# Patient Record
Sex: Male | Born: 1935
Health system: Southern US, Community
[De-identification: ages and names within clinical notes are randomized; demographics above are authoritative.]

## PROBLEM LIST (undated history)

## (undated) DIAGNOSIS — M1990 Unspecified osteoarthritis, unspecified site: Secondary | ICD-10-CM

## (undated) DIAGNOSIS — J309 Allergic rhinitis, unspecified: Secondary | ICD-10-CM

## (undated) DIAGNOSIS — R39198 Other difficulties with micturition: Secondary | ICD-10-CM

## (undated) DIAGNOSIS — J189 Pneumonia, unspecified organism: Secondary | ICD-10-CM

## (undated) DIAGNOSIS — C3490 Malignant neoplasm of unspecified part of unspecified bronchus or lung: Secondary | ICD-10-CM

## (undated) DIAGNOSIS — N400 Enlarged prostate without lower urinary tract symptoms: Secondary | ICD-10-CM

## (undated) DIAGNOSIS — R490 Dysphonia: Secondary | ICD-10-CM

## (undated) DIAGNOSIS — R0600 Dyspnea, unspecified: Secondary | ICD-10-CM

## (undated) DIAGNOSIS — I480 Paroxysmal atrial fibrillation: Secondary | ICD-10-CM

## (undated) DIAGNOSIS — R519 Headache, unspecified: Secondary | ICD-10-CM

## (undated) DIAGNOSIS — J449 Chronic obstructive pulmonary disease, unspecified: Secondary | ICD-10-CM

## (undated) DIAGNOSIS — R51 Headache: Secondary | ICD-10-CM

## (undated) DIAGNOSIS — K279 Peptic ulcer, site unspecified, unspecified as acute or chronic, without hemorrhage or perforation: Secondary | ICD-10-CM

## (undated) DIAGNOSIS — J329 Chronic sinusitis, unspecified: Secondary | ICD-10-CM

## (undated) DIAGNOSIS — A048 Other specified bacterial intestinal infections: Secondary | ICD-10-CM

## (undated) DIAGNOSIS — G4700 Insomnia, unspecified: Secondary | ICD-10-CM

## (undated) DIAGNOSIS — R5383 Other fatigue: Secondary | ICD-10-CM

## (undated) DIAGNOSIS — E785 Hyperlipidemia, unspecified: Secondary | ICD-10-CM

## (undated) DIAGNOSIS — G8929 Other chronic pain: Secondary | ICD-10-CM

## (undated) DIAGNOSIS — K219 Gastro-esophageal reflux disease without esophagitis: Secondary | ICD-10-CM

## (undated) DIAGNOSIS — R5381 Other malaise: Secondary | ICD-10-CM

## (undated) HISTORY — DX: Allergic rhinitis, unspecified: J30.9

## (undated) HISTORY — PX: SHOULDER ARTHROSCOPY: SHX128

## (undated) HISTORY — DX: Headache: R51

## (undated) HISTORY — DX: Headache, unspecified: R51.9

## (undated) HISTORY — DX: Chronic obstructive pulmonary disease, unspecified: J44.9

## (undated) HISTORY — DX: Other chronic pain: G89.29

## (undated) HISTORY — PX: CATARACT EXTRACTION: SUR2

## (undated) HISTORY — DX: Benign prostatic hyperplasia without lower urinary tract symptoms: N40.0

## (undated) HISTORY — PX: HEMORRHOID BANDING: SHX5850

## (undated) HISTORY — PX: COLONOSCOPY: SHX174

## (undated) HISTORY — DX: Other malaise: R53.81

## (undated) HISTORY — PX: KNEE ARTHROSCOPY: SHX127

## (undated) HISTORY — DX: Dysphonia: R49.0

## (undated) HISTORY — DX: Peptic ulcer, site unspecified, unspecified as acute or chronic, without hemorrhage or perforation: K27.9

## (undated) HISTORY — PX: ROTATOR CUFF REPAIR: SHX139

## (undated) HISTORY — DX: Hyperlipidemia, unspecified: E78.5

## (undated) HISTORY — DX: Other fatigue: R53.83

## (undated) HISTORY — DX: Gastro-esophageal reflux disease without esophagitis: K21.9

## (undated) HISTORY — DX: Insomnia, unspecified: G47.00

---

## 1998-01-06 ENCOUNTER — Emergency Department (HOSPITAL_COMMUNITY): Admission: EM | Admit: 1998-01-06 | Discharge: 1998-01-06 | Payer: Self-pay

## 2000-10-17 ENCOUNTER — Ambulatory Visit (HOSPITAL_COMMUNITY): Admission: RE | Admit: 2000-10-17 | Discharge: 2000-10-17 | Payer: Self-pay | Admitting: Gastroenterology

## 2001-03-18 ENCOUNTER — Emergency Department (HOSPITAL_COMMUNITY): Admission: EM | Admit: 2001-03-18 | Discharge: 2001-03-18 | Payer: Self-pay | Admitting: Emergency Medicine

## 2002-09-02 ENCOUNTER — Encounter: Payer: Self-pay | Admitting: Emergency Medicine

## 2002-09-02 ENCOUNTER — Emergency Department (HOSPITAL_COMMUNITY): Admission: EM | Admit: 2002-09-02 | Discharge: 2002-09-02 | Payer: Self-pay | Admitting: Emergency Medicine

## 2003-03-15 ENCOUNTER — Encounter: Admission: RE | Admit: 2003-03-15 | Discharge: 2003-03-15 | Payer: Self-pay | Admitting: Orthopedic Surgery

## 2004-01-16 ENCOUNTER — Ambulatory Visit: Payer: Self-pay | Admitting: Internal Medicine

## 2004-03-06 ENCOUNTER — Ambulatory Visit: Payer: Self-pay | Admitting: Internal Medicine

## 2004-03-08 ENCOUNTER — Ambulatory Visit (HOSPITAL_COMMUNITY): Admission: RE | Admit: 2004-03-08 | Discharge: 2004-03-08 | Payer: Self-pay | Admitting: Internal Medicine

## 2004-03-27 ENCOUNTER — Ambulatory Visit: Payer: Self-pay | Admitting: Internal Medicine

## 2004-03-31 ENCOUNTER — Ambulatory Visit: Payer: Self-pay | Admitting: Internal Medicine

## 2004-04-01 ENCOUNTER — Ambulatory Visit: Payer: Self-pay | Admitting: Internal Medicine

## 2004-04-06 ENCOUNTER — Ambulatory Visit: Payer: Self-pay | Admitting: Internal Medicine

## 2004-04-21 ENCOUNTER — Ambulatory Visit: Payer: Self-pay | Admitting: Internal Medicine

## 2004-07-16 ENCOUNTER — Ambulatory Visit: Payer: Self-pay | Admitting: Internal Medicine

## 2004-07-20 ENCOUNTER — Ambulatory Visit (HOSPITAL_COMMUNITY): Admission: RE | Admit: 2004-07-20 | Discharge: 2004-07-20 | Payer: Self-pay | Admitting: Internal Medicine

## 2004-07-20 ENCOUNTER — Ambulatory Visit: Payer: Self-pay | Admitting: Gastroenterology

## 2004-07-31 ENCOUNTER — Ambulatory Visit: Payer: Self-pay | Admitting: Gastroenterology

## 2004-07-31 ENCOUNTER — Encounter (INDEPENDENT_AMBULATORY_CARE_PROVIDER_SITE_OTHER): Payer: Self-pay | Admitting: *Deleted

## 2004-08-06 ENCOUNTER — Ambulatory Visit: Payer: Self-pay | Admitting: Internal Medicine

## 2004-09-03 ENCOUNTER — Ambulatory Visit: Payer: Self-pay | Admitting: Gastroenterology

## 2004-12-28 ENCOUNTER — Ambulatory Visit: Payer: Self-pay | Admitting: Internal Medicine

## 2005-04-25 ENCOUNTER — Emergency Department (HOSPITAL_COMMUNITY): Admission: EM | Admit: 2005-04-25 | Discharge: 2005-04-25 | Payer: Self-pay | Admitting: Emergency Medicine

## 2005-04-27 ENCOUNTER — Ambulatory Visit: Payer: Self-pay

## 2005-10-19 ENCOUNTER — Ambulatory Visit: Payer: Self-pay | Admitting: Internal Medicine

## 2006-01-03 ENCOUNTER — Ambulatory Visit: Payer: Self-pay | Admitting: Endocrinology

## 2006-06-30 ENCOUNTER — Ambulatory Visit: Payer: Self-pay | Admitting: Internal Medicine

## 2006-06-30 LAB — CONVERTED CEMR LAB
AST: 26 units/L (ref 0–37)
Albumin: 4 g/dL (ref 3.5–5.2)
Basophils Absolute: 0 10*3/uL (ref 0.0–0.1)
Bilirubin, Direct: 0.1 mg/dL (ref 0.0–0.3)
Direct LDL: 133 mg/dL
Eosinophils Absolute: 0 10*3/uL (ref 0.0–0.6)
Eosinophils Relative: 0.4 % (ref 0.0–5.0)
GFR calc Af Amer: 122 mL/min
GFR calc non Af Amer: 101 mL/min
Glucose, Bld: 104 mg/dL — ABNORMAL HIGH (ref 70–99)
HCT: 40 % (ref 39.0–52.0)
Lymphocytes Relative: 17.8 % (ref 12.0–46.0)
MCHC: 34.1 g/dL (ref 30.0–36.0)
MCV: 93.5 fL (ref 78.0–100.0)
Monocytes Absolute: 0.5 10*3/uL (ref 0.2–0.7)
Neutro Abs: 4.5 10*3/uL (ref 1.4–7.7)
Neutrophils Relative %: 73.6 % (ref 43.0–77.0)
Potassium: 4.3 meq/L (ref 3.5–5.1)
Sodium: 146 meq/L — ABNORMAL HIGH (ref 135–145)
TSH: 0.73 microintl units/mL (ref 0.35–5.50)
Triglycerides: 89 mg/dL (ref 0–149)
WBC: 6.1 10*3/uL (ref 4.5–10.5)

## 2006-08-16 ENCOUNTER — Encounter: Admission: RE | Admit: 2006-08-16 | Discharge: 2006-08-16 | Payer: Self-pay | Admitting: Orthopedic Surgery

## 2006-09-09 ENCOUNTER — Ambulatory Visit: Payer: Self-pay | Admitting: Internal Medicine

## 2006-09-10 ENCOUNTER — Encounter: Payer: Self-pay | Admitting: Internal Medicine

## 2006-09-10 DIAGNOSIS — R51 Headache: Secondary | ICD-10-CM

## 2006-09-10 DIAGNOSIS — E785 Hyperlipidemia, unspecified: Secondary | ICD-10-CM

## 2006-09-10 DIAGNOSIS — R519 Headache, unspecified: Secondary | ICD-10-CM | POA: Insufficient documentation

## 2006-09-10 DIAGNOSIS — N400 Enlarged prostate without lower urinary tract symptoms: Secondary | ICD-10-CM

## 2006-09-10 HISTORY — DX: Benign prostatic hyperplasia without lower urinary tract symptoms: N40.0

## 2006-09-10 HISTORY — DX: Headache: R51

## 2006-09-10 HISTORY — DX: Hyperlipidemia, unspecified: E78.5

## 2006-09-29 ENCOUNTER — Encounter: Admission: RE | Admit: 2006-09-29 | Discharge: 2006-09-29 | Payer: Self-pay | Admitting: Orthopedic Surgery

## 2007-01-23 DIAGNOSIS — J309 Allergic rhinitis, unspecified: Secondary | ICD-10-CM | POA: Insufficient documentation

## 2007-01-23 DIAGNOSIS — K279 Peptic ulcer, site unspecified, unspecified as acute or chronic, without hemorrhage or perforation: Secondary | ICD-10-CM | POA: Insufficient documentation

## 2007-01-23 DIAGNOSIS — K649 Unspecified hemorrhoids: Secondary | ICD-10-CM | POA: Insufficient documentation

## 2007-01-23 HISTORY — DX: Peptic ulcer, site unspecified, unspecified as acute or chronic, without hemorrhage or perforation: K27.9

## 2007-01-23 HISTORY — DX: Allergic rhinitis, unspecified: J30.9

## 2007-02-14 ENCOUNTER — Encounter: Payer: Self-pay | Admitting: Internal Medicine

## 2007-03-06 ENCOUNTER — Encounter: Payer: Self-pay | Admitting: Internal Medicine

## 2007-06-30 LAB — CONVERTED CEMR LAB: PSA: NORMAL ng/mL

## 2007-08-01 ENCOUNTER — Telehealth: Payer: Self-pay | Admitting: Internal Medicine

## 2007-08-02 ENCOUNTER — Telehealth: Payer: Self-pay | Admitting: Internal Medicine

## 2007-12-29 ENCOUNTER — Telehealth (INDEPENDENT_AMBULATORY_CARE_PROVIDER_SITE_OTHER): Payer: Self-pay | Admitting: *Deleted

## 2008-01-08 ENCOUNTER — Ambulatory Visit: Payer: Self-pay | Admitting: Internal Medicine

## 2008-01-08 DIAGNOSIS — R5383 Other fatigue: Secondary | ICD-10-CM

## 2008-01-08 DIAGNOSIS — R5381 Other malaise: Secondary | ICD-10-CM

## 2008-01-08 HISTORY — DX: Other malaise: R53.81

## 2008-01-08 HISTORY — DX: Other fatigue: R53.83

## 2008-01-09 LAB — CONVERTED CEMR LAB
AST: 24 units/L (ref 0–37)
Albumin: 3.7 g/dL (ref 3.5–5.2)
Alkaline Phosphatase: 75 units/L (ref 39–117)
BUN: 19 mg/dL (ref 6–23)
Basophils Absolute: 0 10*3/uL (ref 0.0–0.1)
Basophils Relative: 0.7 % (ref 0.0–3.0)
Chloride: 109 meq/L (ref 96–112)
Cholesterol: 180 mg/dL (ref 0–200)
Glucose, Bld: 95 mg/dL (ref 70–99)
HCT: 38.7 % — ABNORMAL LOW (ref 39.0–52.0)
Hemoglobin: 13.8 g/dL (ref 13.0–17.0)
LDL Cholesterol: 102 mg/dL — ABNORMAL HIGH (ref 0–99)
Lymphocytes Relative: 32.6 % (ref 12.0–46.0)
MCHC: 35.6 g/dL (ref 30.0–36.0)
Monocytes Absolute: 0.6 10*3/uL (ref 0.1–1.0)
Monocytes Relative: 12.2 % — ABNORMAL HIGH (ref 3.0–12.0)
Neutro Abs: 2.7 10*3/uL (ref 1.4–7.7)
Potassium: 4.1 meq/L (ref 3.5–5.1)
RBC: 4.19 M/uL — ABNORMAL LOW (ref 4.22–5.81)
Total Protein: 6.3 g/dL (ref 6.0–8.3)

## 2008-05-16 ENCOUNTER — Telehealth (INDEPENDENT_AMBULATORY_CARE_PROVIDER_SITE_OTHER): Payer: Self-pay | Admitting: *Deleted

## 2008-05-16 ENCOUNTER — Encounter: Payer: Self-pay | Admitting: Internal Medicine

## 2008-06-07 ENCOUNTER — Ambulatory Visit (HOSPITAL_COMMUNITY): Admission: RE | Admit: 2008-06-07 | Discharge: 2008-06-09 | Payer: Self-pay | Admitting: Orthopedic Surgery

## 2008-07-15 ENCOUNTER — Telehealth (INDEPENDENT_AMBULATORY_CARE_PROVIDER_SITE_OTHER): Payer: Self-pay | Admitting: *Deleted

## 2009-01-16 ENCOUNTER — Telehealth: Payer: Self-pay | Admitting: Internal Medicine

## 2009-04-21 ENCOUNTER — Telehealth: Payer: Self-pay | Admitting: Internal Medicine

## 2009-04-24 ENCOUNTER — Ambulatory Visit: Payer: Self-pay | Admitting: Internal Medicine

## 2009-04-24 DIAGNOSIS — G4700 Insomnia, unspecified: Secondary | ICD-10-CM

## 2009-04-24 HISTORY — DX: Insomnia, unspecified: G47.00

## 2009-04-24 LAB — CONVERTED CEMR LAB
AST: 26 units/L (ref 0–37)
Alkaline Phosphatase: 75 units/L (ref 39–117)
BUN: 22 mg/dL (ref 6–23)
Basophils Absolute: 0 10*3/uL (ref 0.0–0.1)
Bilirubin Urine: NEGATIVE
Calcium: 9.1 mg/dL (ref 8.4–10.5)
Creatinine, Ser: 0.7 mg/dL (ref 0.4–1.5)
Direct LDL: 137.3 mg/dL
GFR calc non Af Amer: 116.37 mL/min (ref >60)
Glucose, Bld: 84 mg/dL (ref 70–99)
HDL: 57.6 mg/dL (ref >39.00)
Hemoglobin, Urine: NEGATIVE
Lymphocytes Relative: 22.2 % (ref 12.0–46.0)
Lymphs Abs: 1.5 10*3/uL (ref 0.7–4.0)
Monocytes Relative: 8.8 % (ref 3.0–12.0)
Nitrite: NEGATIVE
Platelets: 202 10*3/uL (ref 150.0–400.0)
RDW: 11.8 % (ref 11.5–14.6)
TSH: 0.96 microintl units/mL (ref 0.35–5.50)
Total Bilirubin: 0.6 mg/dL (ref 0.3–1.2)
Total Protein, Urine: NEGATIVE mg/dL
Triglycerides: 156 mg/dL — ABNORMAL HIGH (ref 0.0–149.0)
Urine Glucose: NEGATIVE mg/dL

## 2009-05-13 ENCOUNTER — Encounter: Payer: Self-pay | Admitting: Internal Medicine

## 2009-08-26 ENCOUNTER — Telehealth: Payer: Self-pay | Admitting: Internal Medicine

## 2009-08-27 ENCOUNTER — Encounter: Payer: Self-pay | Admitting: Internal Medicine

## 2009-11-13 ENCOUNTER — Telehealth: Payer: Self-pay | Admitting: Internal Medicine

## 2009-12-02 ENCOUNTER — Ambulatory Visit: Payer: Self-pay | Admitting: Internal Medicine

## 2010-03-27 ENCOUNTER — Other Ambulatory Visit: Payer: Self-pay | Admitting: Internal Medicine

## 2010-03-27 ENCOUNTER — Ambulatory Visit
Admission: RE | Admit: 2010-03-27 | Discharge: 2010-03-27 | Payer: Self-pay | Source: Home / Self Care | Attending: Internal Medicine | Admitting: Internal Medicine

## 2010-03-27 ENCOUNTER — Encounter: Payer: Self-pay | Admitting: Internal Medicine

## 2010-03-27 DIAGNOSIS — R0609 Other forms of dyspnea: Secondary | ICD-10-CM | POA: Insufficient documentation

## 2010-03-27 DIAGNOSIS — R0989 Other specified symptoms and signs involving the circulatory and respiratory systems: Secondary | ICD-10-CM

## 2010-03-27 LAB — CBC WITH DIFFERENTIAL/PLATELET
Basophils Relative: 0.4 % (ref 0.0–3.0)
Eosinophils Absolute: 0.1 10*3/uL (ref 0.0–0.7)
Eosinophils Relative: 1.5 % (ref 0.0–5.0)
Lymphocytes Relative: 29.1 % (ref 12.0–46.0)
Monocytes Absolute: 0.6 10*3/uL (ref 0.1–1.0)
Neutrophils Relative %: 60 % (ref 43.0–77.0)
Platelets: 216 10*3/uL (ref 150.0–400.0)
RBC: 4.23 Mil/uL (ref 4.22–5.81)
WBC: 6.5 10*3/uL (ref 4.5–10.5)

## 2010-03-27 LAB — LDL CHOLESTEROL, DIRECT: Direct LDL: 134.4 mg/dL

## 2010-03-27 LAB — LIPID PANEL
Cholesterol: 207 mg/dL — ABNORMAL HIGH (ref 0–200)
Total CHOL/HDL Ratio: 4
Triglycerides: 175 mg/dL — ABNORMAL HIGH (ref 0.0–149.0)
VLDL: 35 mg/dL (ref 0.0–40.0)

## 2010-03-27 LAB — BASIC METABOLIC PANEL
BUN: 16 mg/dL (ref 6–23)
Calcium: 9 mg/dL (ref 8.4–10.5)
Creatinine, Ser: 0.8 mg/dL (ref 0.4–1.5)

## 2010-03-27 LAB — HEPATIC FUNCTION PANEL
ALT: 28 U/L (ref 0–53)
Alkaline Phosphatase: 81 U/L (ref 39–117)
Bilirubin, Direct: 0.1 mg/dL (ref 0.0–0.3)
Total Bilirubin: 0.7 mg/dL (ref 0.3–1.2)
Total Protein: 6.3 g/dL (ref 6.0–8.3)

## 2010-03-31 NOTE — Assessment & Plan Note (Signed)
Summary: FLU SHOT/ COMING WITH WIFE/NWS  Nurse Visit   Vitals Entered By: Orlan Leavens RMA (December 02, 2009 4:07 PM)  Allergies: 1)  ! Pcn  Orders Added: 1)  Flu Vaccine 81yrs + MEDICARE PATIENTS [Q2039] 2)  Administration Flu vaccine - MCR [G0008] Flu Vaccine Consent Questions     Do you have a history of severe allergic reactions to this vaccine? no    Any prior history of allergic reactions to egg and/or gelatin? no    Do you have a sensitivity to the preservative Thimersol? no    Do you have a past history of Guillan-Barre Syndrome? no    Do you currently have an acute febrile illness? no    Have you ever had a severe reaction to latex? no    Vaccine information given and explained to patient? yes    Are you currently pregnant? no    Lot Number:[id number]   Exp Date:08/29/2010   Site Given  Left Deltoid IM .lbmedflu

## 2010-03-31 NOTE — Progress Notes (Signed)
Summary: ZOSTAVAX RX  Phone Note Call from Patient Call back at Home Phone [phone]   Caller: [facility] Summary of Call: Raymond Logan WANTS AN RX CALLED INTO [facility] ON [address] FOR ZOSTAVAX VACCINE.  PLEASE LET THE PT KNOW IF IT HAS BEEN CALLED IN.  HOME # [phone] Initial call taken by: Hilarie Fredrickson,  August 26, 2009 10:58 AM  Follow-up for Phone Call        Okay to Rx and send to pharmacy? Follow-up by: Margaret Pyle, CMA,  August 26, 2009 11:00 AM  Additional Follow-up for Phone Call Additional follow up Details #1::        ok for rx Additional Follow-up by: Corwin Levins MD,  August 26, 2009 11:22 AM    New/Updated Medications: ZOSTAVAX 47829 UNT/0.65ML SOLR (ZOSTER VACCINE LIVE) use as directed Prescriptions: ZOSTAVAX 56213 UNT/0.65ML SOLR (ZOSTER VACCINE LIVE) use as directed  #1 x 0   Entered by:   Margaret Pyle, CMA   Authorized by:   Corwin Levins MD   Signed by:   Margaret Pyle, CMA on 08/26/2009   Method used:   Electronically to        [facility] [phone]* (retail)       [address]       [place], [place]  [address]       Ph: [phone] or [phone]       Fax: [phone]   RxID:   [id number]

## 2010-03-31 NOTE — Assessment & Plan Note (Signed)
Summary: MED REFILL/NWS  #   Vital Signs:  Patient profile:   77 year old male Height:      70 inches Weight:      169 pounds BMI:     24.34 O2 Sat:      97 % on Room air Temp:     97.1 degrees F oral Pulse rate:   57 / minute BP sitting:   112 / 60  (left arm) Cuff size:   regular  Vitals Entered ByZella Ball Ewing (April 24, 2009 10:15 AM)  O2 Flow:  Room air  CC: Medication refills/RE   CC:  Medication refills/RE.  History of Present Illness: plays b-ball 2 hours twice per wk minimum; Pt denies CP, sob, doe, wheezing, orthopnea, pnd, worsening LE edema, palps, dizziness or syncope  Pt denies new neuro symptoms such as headache, facial or extremity weakness   Saw neurologist due to excedrin dependence - now on gabapentin.  Has some fatigue but no osa symtpoms. Here for wellness Diet: Heart Healthy or DM if diabetic Physical Activities: Active as above  Depression/mood screen: Negative Hearing: Intact bilateral Visual Acuity: Grossly normal, wears glases, sees optho yearly ADL's: Capable  Fall Risk: None Home Safety: Good End-of-Life Planning: Advance directive - Full code/I agree   Problems Prior to Update: 1)  Fatigue  (ICD-780.79) 2)  Fatigue  (ICD-780.79) 3)  Unspec Hemorrhoids Without Mention Complication  (ICD-455.6) 4)  Peptic Ulcer Disease  (ICD-533.90) 5)  Allergic Rhinitis  (ICD-477.9) 6)  Headache  (ICD-784.0) 7)  Benign Prostatic Hypertrophy  (ICD-600.00) 8)  Hyperlipidemia  (ICD-272.4)  Medications Prior to Update: 1)  Zolpidem Tartrate 10 Mg Tabs (Zolpidem Tartrate) .[name].. 1 By Mouth At Bedtime As Needed - Needs Return Office Visit For Further Refills 2)  Proscar 5 Mg  Tabs (Finasteride) .[name].. 1 By Mouth Once Daily 3)  Gabapentin 600 Mg  Tabs (Gabapentin) .[name].. 1 By Mouth Two Times A Day For Headaches 4)  Baclofen 10 Mg Tabs (Baclofen) .[name].. 1 By Mouth Two Times A Day As Needed Headache 5)  Adult Aspirin Ec Low Strength 81 Mg Tbec (Aspirin) .[name].. 1 By  Mouth Once Daily  Current Medications (verified): 1)  Zolpidem Tartrate 10 Mg Tabs (Zolpidem Tartrate) .[name].. 1 By Mouth At Bedtime As Needed 2)  Proscar 5 Mg  Tabs (Finasteride) .[name].. 1 By Mouth Once Daily 3)  Gabapentin 600 Mg  Tabs (Gabapentin) .[name].. 1 By Mouth Two Times A Day For Headaches 4)  Baclofen 10 Mg Tabs (Baclofen) .[name].. 1 By Mouth Two Times A Day As Needed Headache 5)  Adult Aspirin Ec Low Strength 81 Mg Tbec (Aspirin) .[name].. 1 By Mouth Once Daily  Allergies (verified): 1)  ! Pcn  Past History:  Family History: Last updated: 01/23/2007 sister with colon polyps  Social History: Last updated: 01/08/2008 Never Smoked Alcohol use-yes Married retired - [employer] - [place] [employer] 3 children  Risk Factors: Smoking Status: never (01/23/2007)  Past Medical History: Reviewed history from 01/08/2008 and no changes required. Hyperlipidemia Benign prostatic hypertrophy - dr Vonita Moss Allergic rhinitis hx of h pylori gastritis Peptic ulcer disease hemorrhoids recurrant headaches - dr Neale Burly  Past Surgical History: Rotator cuff repair-(R) 1997 Hemorrhoidectomy s/p left shoulder surgury 2010 s/p left knee surgury - arthroscopic - 2010 - dr Darrelyn Hillock  Review of Systems  The patient denies anorexia, fever, weight loss, weight gain, vision loss, decreased hearing, hoarseness, chest pain, syncope, dyspnea on exertion, peripheral edema, prolonged cough, headaches,  hemoptysis, abdominal pain, melena, hematochezia, severe indigestion/heartburn, hematuria, incontinence, muscle weakness, suspicious skin lesions, difficulty walking, depression, unusual weight change, abnormal bleeding, enlarged lymph nodes, and angioedema.         all otherwise negative per pt - saw urology recent with UTI and psa - tx with antibx - also with mild fatigue recently but no OSA symtpoms, wt loss, fever, night sweats  Physical Exam  General:  alert and well-developed.   Head:  normocephalic  and atraumatic.   Eyes:  vision grossly intact, pupils equal, and pupils round.   Ears:  R ear normal and L ear normal.   Nose:  no external deformity and no nasal discharge.   Mouth:  no gingival abnormalities and pharynx pink and moist.   Neck:  supple and no masses.   Lungs:  normal respiratory effort and normal breath sounds.   Heart:  normal rate and regular rhythm.   Abdomen:  soft, non-tender, and normal bowel sounds.   Msk:  no joint tenderness and no joint swelling.   Extremities:  no edema, no erythema  Neurologic:  cranial nerves II-XII intact and strength normal in all extremities.     Impression & Recommendations:  Problem # 1:  Preventive Health Care (ICD-V70.0)  Overall doing well, age appropriate education and counseling updated and referral for appropriate preventive services done unless declined, immunizations up to date or declined, diet counseling done if overweight, urged to quit smoking if smokes , most recent labs reviewed and current ordered if appropriate, ecg reviewed or declined (interpretation per ECG scanned in the EMR if done); information regarding Medicare Prevention requirements given if appropriate   Orders: First annual wellness visit with prevention plan  (Z6109)  Problem # 2:  HYPERLIPIDEMIA (ICD-272.4)  Orders: TLB-Lipid Panel (80061-LIPID)  Labs Reviewed: SGOT: 24 (01/08/2008)   SGPT: 18 (01/08/2008)   HDL:42.5 (01/08/2008), 55.2 (06/30/2006)  LDL:102 (01/08/2008), DEL (06/30/2006)  Chol:180 (01/08/2008), 217 (06/30/2006)  Trig:177 (01/08/2008), 89 (06/30/2006) stable overall by hx and exam, ok to continue meds/tx as is   Problem # 3:  FATIGUE (ICD-780.79) exam benign, to check labs below; follow with expectant management  Orders: TLB-BMP (Basic Metabolic Panel-BMET) (80048-METABOL) TLB-CBC Platelet - w/Differential (85025-CBCD) TLB-Hepatic/Liver Function Pnl (80076-HEPATIC) TLB-TSH (Thyroid Stimulating Hormone)  (84443-TSH) TLB-Sedimentation Rate (ESR) (85652-ESR)  Problem # 4:  INSOMNIA-SLEEP DISORDER-UNSPEC (ICD-780.52)  His updated medication list for this problem includes:    Zolpidem Tartrate 10 Mg Tabs (Zolpidem tartrate) .[name]... 1 by mouth at bedtime as needed treat as above, f/u any worsening signs or symptoms   Orders: Prescription Created Electronically [phone])  Complete Medication List: 1)  Zolpidem Tartrate 10 Mg Tabs (Zolpidem tartrate) .[name].. 1 by mouth at bedtime as needed 2)  Proscar 5 Mg Tabs (Finasteride) .[name].. 1 by mouth once daily 3)  Gabapentin 600 Mg Tabs (Gabapentin) .[name].. 1 by mouth two times a day for headaches 4)  Baclofen 10 Mg Tabs (Baclofen) .[name].. 1 by mouth two times a day as needed headache 5)  Adult Aspirin Ec Low Strength 81 Mg Tbec (Aspirin) .[name].. 1 by mouth once daily  Other Orders: TLB-Udip ONLY (81003-UDIP)  Patient Instructions: 1)  Continue all previous medications as before this visit  2)  Please go to the Lab in the basement for your blood and/or urine tests today 3)  Please schedule a follow-up appointment in 1 year or sooner if needed Prescriptions: ZOLPIDEM TARTRATE 10 MG TABS (ZOLPIDEM TARTRATE) 1 by mouth at bedtime as needed  #90  x 1   Entered by:   Scharlene Gloss   Authorized by:   Corwin Levins MD   Signed by:   Scharlene Gloss on 04/24/2009   Method used:   Print then Give to Patient   RxID:   [id number] ZOLPIDEM TARTRATE 10 MG TABS (ZOLPIDEM TARTRATE) 1 by mouth at bedtime as needed  #30 x 5   Entered and Authorized by:   Corwin Levins MD   Signed by:   Corwin Levins MD on 04/24/2009   Method used:   Print then Give to Patient   RxID:   [id number]

## 2010-03-31 NOTE — Progress Notes (Signed)
Summary: medication refill  Phone Note Refill Request Message from:  Fax from Pharmacy on November 13, 2009 8:08 AM  Refills Requested: Medication #1:  ZOLPIDEM TARTRATE 10 MG TABS 1 by mouth at bedtime as needed   Dosage confirmed as above?Dosage Confirmed   Last Refilled: 04/24/2009   Notes: [facility], [phone] Initial call taken by: Zella Ball Ewing CMA Duncan Dull),  November 13, 2009 8:09 AM  Follow-up for Phone Call        [facility]ed to [facility] Follow-up by: Brenton Grills MA,  November 13, 2009 8:57 AM    New/Updated Medications: ZOLPIDEM TARTRATE 10 MG TABS (ZOLPIDEM TARTRATE) 1 by mouth at bedtime as needed Prescriptions: ZOLPIDEM TARTRATE 10 MG TABS (ZOLPIDEM TARTRATE) 1 by mouth at bedtime as needed  #90 x 1   Entered and Authorized by:   Corwin Levins MD   Signed by:   Corwin Levins MD on 11/13/2009   Method used:   Print then Give to Patient   RxID:   [id number]  done hardcopy to LIM side B - dahlia Corwin Levins MD  November 13, 2009 8:30 AM

## 2010-03-31 NOTE — Miscellaneous (Signed)
Summary: [facility]   Imported By: Sherian Rein 09/03/2009 14:48:15  _____________________________________________________________________  External Attachment:    Type:   Image     Comment:   External Document

## 2010-03-31 NOTE — Progress Notes (Signed)
Summary: Zolpidem refill  Phone Note Refill Request Message from:  Fax from Pharmacy on April 21, 2009 8:58 AM  Refills Requested: Medication #1:  ZOLPIDEM TARTRATE 10 MG TABS 1 by mouth at bedtime as needed - needs return office visit for further refills Next Appointment Scheduled: none Initial call taken by: Lucious Groves,  April 21, 2009 8:58 AM  Follow-up for Phone Call        denied - pt needs OV as per note from last rx filled (not seen since 2009) - thanks Follow-up by: Newt Lukes MD,  April 21, 2009 9:14 AM  Additional Follow-up for Phone Call Additional follow up Details #1::        Patient notified and transferred to schedule appt. Additional Follow-up by: Lucious Groves,  April 21, 2009 10:42 AM

## 2010-03-31 NOTE — Miscellaneous (Signed)
Summary: [facility]  [facility]   Imported By: Sherian Rein 07/10/2009 08:20:21  _____________________________________________________________________  External Attachment:    Type:   Image     Comment:   External Document

## 2010-04-01 ENCOUNTER — Ambulatory Visit (HOSPITAL_COMMUNITY): Payer: Medicare Other | Attending: Internal Medicine

## 2010-04-01 DIAGNOSIS — I059 Rheumatic mitral valve disease, unspecified: Secondary | ICD-10-CM | POA: Insufficient documentation

## 2010-04-01 DIAGNOSIS — R0609 Other forms of dyspnea: Secondary | ICD-10-CM | POA: Insufficient documentation

## 2010-04-01 DIAGNOSIS — E785 Hyperlipidemia, unspecified: Secondary | ICD-10-CM | POA: Insufficient documentation

## 2010-04-01 DIAGNOSIS — R0989 Other specified symptoms and signs involving the circulatory and respiratory systems: Secondary | ICD-10-CM | POA: Insufficient documentation

## 2010-04-02 ENCOUNTER — Encounter (INDEPENDENT_AMBULATORY_CARE_PROVIDER_SITE_OTHER): Payer: Medicare Other

## 2010-04-02 ENCOUNTER — Encounter: Payer: Self-pay | Admitting: Internal Medicine

## 2010-04-02 DIAGNOSIS — R0989 Other specified symptoms and signs involving the circulatory and respiratory systems: Secondary | ICD-10-CM

## 2010-04-02 NOTE — Assessment & Plan Note (Signed)
Summary: short of breath when runs/nws   Vital Signs:  Patient profile:   77 year old male Height:      69 inches Weight:      168.25 pounds BMI:     24.94 O2 Sat:      98 % on Room air Temp:     98.7 degrees F oral Pulse rate:   55 / minute BP sitting:   102 / 64  (left arm) Cuff size:   regular  Vitals Entered By: Zella Ball Ewing CMA Duncan Dull) (March 27, 2010 2:36 PM)  O2 Flow:  Room air CC: SOB after playing basketball/RE   CC:  SOB after playing basketball/RE.  History of Present Illness: here to f/u with c/o unusual DOE with playing basketball this; has been playing with a group of guy (most younger) for years, fullcourt with a game lasting to 8 pts;  he normally can play 2 games without a rest each evening, without CP, worsening sob, doe, wheezing, orthopnea, pnd, worsening LE edema, palps, dizziness or syncope .  But both evenings this wk, he had unusual DOE such that he could barely finish the 2 games one evening, and had to sit out the second game on the next game night.  Dyspnea better with rest, and takes < 15 min, assoc with fatigue improvement as well.    Pt denies new neuro symptoms such as headache, facial or extremity weakness  Pt denies polydipsia, polyuria  Overall good compliance with meds, trying to follow low chol diet, wt stable. Overall good compliance with meds, and good tolerability.  No fever, wt loss, night sweats, loss of appetite or other constitutional symptoms   Overall good compliance with meds, and good tolerability. Denies worsening depressive symptoms, suicidal ideation, or panic.   No recent falls or othe injury.  No overt bleeding or bruising.    Preventive Screening-Counseling & Management      Drug Use:  no.    Problems Prior to Update: 1)  Dyspnea On Exertion  (ICD-786.09) 2)  Preventive Health Care  (ICD-V70.0) 3)  Insomnia-sleep Disorder-unspec  (ICD-780.52) 4)  Fatigue  (ICD-780.79) 5)  Fatigue  (ICD-780.79) 6)  Unspec Hemorrhoids Without  Mention Complication  (ICD-455.6) 7)  Peptic Ulcer Disease  (ICD-533.90) 8)  Allergic Rhinitis  (ICD-477.9) 9)  Headache  (ICD-784.0) 10)  Benign Prostatic Hypertrophy  (ICD-600.00) 11)  Hyperlipidemia  (ICD-272.4)  Medications Prior to Update: 1)  Zolpidem Tartrate 10 Mg Tabs (Zolpidem Tartrate) .[name].. 1 By Mouth At Bedtime As Needed 2)  Proscar 5 Mg  Tabs (Finasteride) .[name].. 1 By Mouth Once Daily 3)  Gabapentin 600 Mg  Tabs (Gabapentin) .[name].. 1 By Mouth Two Times A Day For Headaches 4)  Baclofen 10 Mg Tabs (Baclofen) .[name].. 1 By Mouth Two Times A Day As Needed Headache 5)  Adult Aspirin Ec Low Strength 81 Mg Tbec (Aspirin) .[name].. 1 By Mouth Once Daily  Current Medications (verified): 1)  Zolpidem Tartrate 10 Mg Tabs (Zolpidem Tartrate) .[name].. 1 By Mouth At Bedtime As Needed 2)  Proscar 5 Mg  Tabs (Finasteride) .[name].. 1 By Mouth Once Daily 3)  Gabapentin 600 Mg  Tabs (Gabapentin) .[name].. 1 By Mouth Two Times A Day For Headaches 4)  Baclofen 10 Mg Tabs (Baclofen) .[name].. 1 By Mouth Two Times A Day As Needed Headache 5)  Adult Aspirin Ec Low Strength 81 Mg Tbec (Aspirin) .[name].. 1 By Mouth Once Daily  Allergies (verified): 1)  ! Pcn  Past History:  Past  Medical History: Last updated: 01/08/2008 Hyperlipidemia Benign prostatic hypertrophy - dr Vonita Moss Allergic rhinitis hx of h pylori gastritis Peptic ulcer disease hemorrhoids recurrant headaches - dr Neale Burly  Past Surgical History: Last updated: 04/24/2009 Rotator cuff repair-(R) 1997 Hemorrhoidectomy s/p left shoulder surgury 2010 s/p left knee surgury - arthroscopic - 2010 - dr Darrelyn Hillock  Social History: Last updated: 03/27/2010 Never Smoked Alcohol use-yes Married retired - [employer] - [place] [employer] 3 children Drug use-no  Risk Factors: Smoking Status: never (01/23/2007)  Social History: Never Smoked Alcohol use-yes 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   Head:  normocephalic and atraumatic.   Eyes:  vision grossly intact, pupils equal, and pupils round.   Ears:  R ear normal and L ear normal.   Nose:  no external deformity and no nasal discharge.   Mouth:  no gingival abnormalities and pharynx pink and moist.   Neck:  supple and no masses.   Lungs:  normal respiratory effort and normal breath sounds.   Heart:  normal rate and regular rhythm.   Abdomen:  soft, non-tender, and normal bowel sounds.   Msk:  no joint tenderness and no joint swelling.   Extremities:  no edema, no erythema  Neurologic:  cranial nerves II-XII intact and strength normal in all extremities.   Skin:  color normal and no rashes.   Psych:  not anxious appearing and not depressed appearing.     Impression & Recommendations:  Problem # 1:  DYSPNEA ON EXERTION (ICD-786.09)  very unusual for this pt after being in very good health for many yrs and remains active; general exam is benign but will check routine labs , ecg, cxr, pft and echo;  doubt he needs stress test or other referral at this time  Orders: EKG w/ Interpretation (93000) Misc. Referral (Misc. Ref) Echo Referral (Echo) TLB-BMP (Basic Metabolic Panel-BMET) (80048-METABOL) TLB-CBC Platelet - w/Differential (85025-CBCD) TLB-Hepatic/Liver Function Pnl (80076-HEPATIC) T-2 View CXR, Same Day (71020.5TC)  Problem # 2:  HYPERLIPIDEMIA (ICD-272.4)  Orders: TLB-Lipid Panel (80061-LIPID)  Labs Reviewed: SGOT: 26 (04/24/2009)   SGPT: 25 (04/24/2009)   HDL:57.60 (04/24/2009), 42.5 (01/08/2008)  LDL:102 (01/08/2008), DEL (06/30/2006)  Chol:209 (04/24/2009), 180 (01/08/2008)  Trig:156.0 (04/24/2009), 177 (01/08/2008) f/e pt - declines statin at this time, Pt to continue diet efforts,  to check labs - goal LDL less than 100  Problem #  3:  FATIGUE (ICD-780.79) mild not assoc with above - for TSH today, exam o/w benign, to check labs below; follow with expectant management  Orders: TLB-TSH (Thyroid Stimulating Hormone) (84443-TSH)  Problem # 4:  BENIGN PROSTATIC HYPERTROPHY (ICD-600.00)  His updated medication list for this problem includes:    Proscar 5 Mg Tabs (Finasteride) .[name]... 1 by mouth once daily stable overall by hx and exam, ok to continue meds/tx as is - cont to f/u with urology, had exam and PSA earlier this wk  PSA: normal per urology (06/30/2007)     Complete Medication List: 1)  Zolpidem Tartrate 10 Mg Tabs (Zolpidem tartrate) .[name].. 1 by mouth at bedtime as needed 2)  Proscar 5 Mg Tabs (Finasteride) .[name].. 1 by mouth once daily 3)  Gabapentin 600 Mg Tabs (Gabapentin) .[name].. 1 by mouth  two times a day for headaches 4)  Baclofen 10 Mg Tabs (Baclofen) .[name].. 1 by mouth two times a day as needed headache 5)  Adult Aspirin Ec Low Strength 81 Mg Tbec (Aspirin) .[name].. 1 by mouth once daily   Patient Instructions: 1)  Your general exam and EKG is good today 2)  Please go to the Lab in the basement for your blood and/or urine tests today 3)  Please go to Radiology in the basement level for your X-Ray today  4)  Please call the number on the [facility] Card for results of your testing 5)  You will be contacted about the referral(s) to: echocardiogram, and lung testing (PFT's) 6)  Please schedule a follow-up appointment in 1 year, or sooner if needed 7)  You will be contacted if specialist referral is needed based on the lab testing   Orders Added: 1)  EKG w/ Interpretation [93000] 2)  Misc. Referral [Misc. Ref] 3)  Echo Referral [Echo] 4)  TLB-BMP (Basic Metabolic Panel-BMET) [80048-METABOL] 5)  TLB-CBC Platelet - w/Differential [85025-CBCD] 6)  TLB-Hepatic/Liver Function Pnl [80076-HEPATIC] 7)  TLB-TSH (Thyroid Stimulating Hormone) [84443-TSH] 8)  TLB-Lipid Panel [80061-LIPID] 9)  T-2 View CXR, Same Day  [71020.5TC] 10)  Est. Patient Level IV [13086]

## 2010-04-03 ENCOUNTER — Encounter: Payer: Self-pay | Admitting: Internal Medicine

## 2010-04-03 DIAGNOSIS — J449 Chronic obstructive pulmonary disease, unspecified: Secondary | ICD-10-CM | POA: Insufficient documentation

## 2010-04-03 DIAGNOSIS — J4489 Other specified chronic obstructive pulmonary disease: Secondary | ICD-10-CM

## 2010-04-03 HISTORY — DX: Other specified chronic obstructive pulmonary disease: J44.89

## 2010-04-03 HISTORY — DX: Chronic obstructive pulmonary disease, unspecified: J44.9

## 2010-04-08 NOTE — Miscellaneous (Signed)
  Clinical Lists Changes  Problems: Added new problem of COPD, MILD (ICD-496) Medications: Added new medication of PROAIR HFA 108 (90 BASE) MCG/ACT AERS (ALBUTEROL SULFATE) 2 puffs four times per day as needed - Signed Rx of PROAIR HFA 108 (90 BASE) MCG/ACT AERS (ALBUTEROL SULFATE) 2 puffs four times per day as needed;  #1 x 11;  Signed;  Entered by: Corwin Levins MD;  Authorized by: Corwin Levins MD;  Method used: Electronically to [id number]*, [address], [place], [place], [place]  [address], Ph: [phone] or [phone], Fax: [phone]    Prescriptions: PROAIR HFA 108 (90 BASE) MCG/ACT AERS (ALBUTEROL SULFATE) 2 puffs four times per day as needed  #1 x 11   Entered and Authorized by:   Corwin Levins MD   Signed by:   Corwin Levins MD on 04/03/2010   Method used:   Electronically to        [facility] [phone]* (retail)       [address]       [place], [place]  [address]       Ph: [phone] or [phone]       Fax: [phone]   RxID:   [id number]

## 2010-04-15 ENCOUNTER — Encounter: Payer: Self-pay | Admitting: Internal Medicine

## 2010-04-15 ENCOUNTER — Ambulatory Visit (INDEPENDENT_AMBULATORY_CARE_PROVIDER_SITE_OTHER): Payer: Medicare Other | Admitting: Internal Medicine

## 2010-04-15 DIAGNOSIS — K219 Gastro-esophageal reflux disease without esophagitis: Secondary | ICD-10-CM

## 2010-04-15 DIAGNOSIS — R490 Dysphonia: Secondary | ICD-10-CM | POA: Insufficient documentation

## 2010-04-15 DIAGNOSIS — J449 Chronic obstructive pulmonary disease, unspecified: Secondary | ICD-10-CM

## 2010-04-15 DIAGNOSIS — G4700 Insomnia, unspecified: Secondary | ICD-10-CM

## 2010-04-15 HISTORY — DX: Gastro-esophageal reflux disease without esophagitis: K21.9

## 2010-04-15 HISTORY — DX: Dysphonia: R49.0

## 2010-04-16 NOTE — Assessment & Plan Note (Signed)
Summary: PFT/SOB---pft charges//jwr   Allergies: 1)  ! Pcn   Other Orders: Carbon Monoxide diffusing w/capacity (04540) Lung Volumes/Gas dilution or washout (98119) Spirometry (Pre & Post) (14782)   Orders Added: 1)  Carbon Monoxide diffusing w/capacity [94729] 2)  Lung Volumes/Gas dilution or washout [94727] 3)  Spirometry (Pre & Post) [94060]

## 2010-04-22 NOTE — Assessment & Plan Note (Signed)
Summary: fading voice   Vital Signs:  Patient profile:   77 year old male Height:      69 inches Weight:      167.25 pounds BMI:     24.79 O2 Sat:      97 % on Room air Temp:     97.8 degrees F oral Pulse rate:   53 / minute BP sitting:   120 / 70  (left arm) Cuff size:   regular  Vitals Entered By: Zella Ball Ewing CMA (AAMA) (April 15, 2010 1:42 PM)  O2 Flow:  Room air CC: Voice problems/RE   CC:  Voice problems/RE.  History of Present Illness: here to f/u with some increased hoarseness it seems;  noticed by daughter about one mo ago who was treeated herself with PPI for reflux assco hoarseness, so he is here to inquire about that;  No fever, wt loss, night sweats, loss of appetite or other constitutional symptoms  No signifcant sinus congestion, cough, or ST and Pt denies CP, worsening sob, doe, wheezing, orthopnea, pnd, worsening LE edema, palps, dizziness or syncope  Pt denies polydipsia, polyuria, Overall good compliance with meds, trying to follow low chol diet, wt stable, little excercise however  s/p H pylori tx with tx that seemed to help, but now taking rolaids about twice per day due to increased reflux';  no dypshgia, bowel change or blood, but has had some increased upper abd discomfort in the past wk , but no n/v.  Denies worsening depressive symptoms, suicidal ideation, or panic, has ongoing stress with bipolar wife, but sleep ok on current meds.    Problems Prior to Update: 1)  Gerd  (ICD-530.81) 2)  Hoarseness  (ICD-784.42) 3)  Copd, Mild  (ICD-496) 4)  Dyspnea On Exertion  (ICD-786.09) 5)  Preventive Health Care  (ICD-V70.0) 6)  Insomnia-sleep Disorder-unspec  (ICD-780.52) 7)  Fatigue  (ICD-780.79) 8)  Fatigue  (ICD-780.79) 9)  Unspec Hemorrhoids Without Mention Complication  (ICD-455.6) 10)  Peptic Ulcer Disease  (ICD-533.90) 11)  Allergic Rhinitis  (ICD-477.9) 12)  Headache  (ICD-784.0) 13)  Benign Prostatic Hypertrophy  (ICD-600.00) 14)  Hyperlipidemia   (ICD-272.4)  Medications Prior to Update: 1)  Zolpidem Tartrate 10 Mg Tabs (Zolpidem Tartrate) .[name].. 1 By Mouth At Bedtime As Needed 2)  Proscar 5 Mg  Tabs (Finasteride) .[name].. 1 By Mouth Once Daily 3)  Gabapentin 600 Mg  Tabs (Gabapentin) .[name].. 1 By Mouth Two Times A Day For Headaches 4)  Baclofen 10 Mg Tabs (Baclofen) .[name].. 1 By Mouth Two Times A Day As Needed Headache 5)  Adult Aspirin Ec Low Strength 81 Mg Tbec (Aspirin) .[name].. 1 By Mouth Once Daily 6)  Proair Hfa 108 (90 Base) Mcg/act Aers (Albuterol Sulfate) .... 2 Puffs Four Times Per Day As Needed  Current Medications (verified): 1)  Zolpidem Tartrate 10 Mg Tabs (Zolpidem Tartrate) .[name].. 1 By Mouth At Bedtime As Needed 2)  Proscar 5 Mg  Tabs (Finasteride) .[name].. 1 By Mouth Once Daily 3)  Gabapentin 600 Mg  Tabs (Gabapentin) .[name].. 1 By Mouth Two Times A Day For Headaches 4)  Baclofen 10 Mg Tabs (Baclofen) .[name].. 1 By Mouth Two Times A Day As Needed Headache 5)  Adult Aspirin Ec Low Strength 81 Mg Tbec (Aspirin) .[name].. 1 By Mouth Once Daily 6)  Proair Hfa 108 (90 Base) Mcg/act Aers (Albuterol Sulfate) .... 2 Puffs Four Times Per Day As Needed 7)  Omeprazole 20 Mg Cpdr (Omeprazole) .[name].. 1po Two Times A Day  Allergies (verified): 1)  ! Pcn  Past History:  Past Surgical History: Last updated: 04/24/2009 Rotator cuff repair-(R) 1997 Hemorrhoidectomy s/p left shoulder surgury 2010 s/p left knee surgury - arthroscopic - 2010 - dr Darrelyn Hillock  Social History: Last updated: 03/27/2010 Never Smoked Alcohol use-yes Married retired - [employer] - [place] [employer] of [employer] 3 children Drug use-no  Risk Factors: Smoking Status: never (01/23/2007)  Past Medical History: Hyperlipidemia Benign prostatic hypertrophy - dr Vonita Moss Allergic rhinitis hx of h pylori gastritis Peptic ulcer disease hemorrhoids recurrant headaches - dr Neale Burly GERD  Review of Systems       all otherwise negative per pt -    Physical Exam  General:  alert and  well-developed.   Head:  normocephalic and atraumatic.   Eyes:  vision grossly intact, pupils equal, and pupils round.   Ears:  R ear normal and L ear normal.   Nose:  no external deformity and no nasal discharge.   Mouth:  no gingival abnormalities and pharynx pink and moist.  , some hoarseness noted on speech today Neck:  supple and no masses.   Lungs:  normal respiratory effort and normal breath sounds.   Heart:  normal rate and regular rhythm.   Abdomen:  soft and normal bowel sounds.  with very mild epigastric tenderness, no guarding or rebound Extremities:  no edema, no erythema  Psych:  not depressed appearing and slightly anxious.     Impression & Recommendations:  Problem # 1:  HOARSENESS (JYN-829.56) suspect reflux related - will tx with two times a day PPI for 2 wks, then once daily after that;  to ENT if not improved to further evaluate, declines cxr today  Problem # 2:  GERD (ICD-530.81)  as above, likely the etiology for the abd tenderness, or gastritis - for treat as above, f/u any worsening signs or symptoms , consider GI if persistent or worsening  His updated medication list for this problem includes:    Omeprazole 20 Mg Cpdr (Omeprazole) .[name]... 1po two times a day  Labs Reviewed: Hgb: 13.8 (03/27/2010)   Hct: 39.6 (03/27/2010)  Problem # 3:  COPD, MILD (ICD-496)  His updated medication list for this problem includes:    Proair Hfa 108 (90 Base) Mcg/act Aers (Albuterol sulfate) .[name]... 2 puffs four times per day as needed stable to improved overall by hx and exam, ok to continue meds/tx as is   Pulmonary Functions Reviewed: O2 sat: 97 (04/15/2010)     Vaccines Reviewed: Pneumovax: Pneumovax (01/08/2008)   Flu Vax: Fluvax 3+ (12/02/2009)  Problem # 4:  INSOMNIA-SLEEP DISORDER-UNSPEC (ICD-780.52)  His updated medication list for this problem includes:    Zolpidem Tartrate 10 Mg Tabs (Zolpidem tartrate) .[name]... 1 by mouth at bedtime as needed stable overall by  hx and exam, ok to continue meds/tx as is   Complete Medication List: 1)  Zolpidem Tartrate 10 Mg Tabs (Zolpidem tartrate) .[name].. 1 by mouth at bedtime as needed 2)  Proscar 5 Mg Tabs (Finasteride) .[name].. 1 by mouth once daily 3)  Gabapentin 600 Mg Tabs (Gabapentin) .[name].. 1 by mouth two times a day for headaches 4)  Baclofen 10 Mg Tabs (Baclofen) .[name].. 1 by mouth two times a day as needed headache 5)  Adult Aspirin Ec Low Strength 81 Mg Tbec (Aspirin) .[name].. 1 by mouth once daily 6)  Proair Hfa 108 (90 Base) Mcg/act Aers (Albuterol sulfate) .... 2 puffs four times per day as needed 7)  Omeprazole 20 Mg  Cpdr (Omeprazole) .[name].. 1po two times a day  Patient Instructions: 1)  Please take all new medications as prescribed  - the dexilant is twice per day for 2 wks, then change to the omeprazole 20 mg two times a day after that 2)  Continue all previous medications as before this visit  3)  Please call in 2-3 wks if not improved, for ENT appt, or even GI appt if the reflux is not well controlled 4)  Please schedule a follow-up appointment as needed Prescriptions: OMEPRAZOLE 20 MG CPDR (OMEPRAZOLE) 1po two times a day  #60 x 11   Entered and Authorized by:   Corwin Levins MD   Signed by:   Corwin Levins MD on 04/15/2010   Method used:   Print then Give to Patient   RxID:   [id number]    Orders Added: 1)  Est. Patient Level IV [44034]

## 2010-06-10 LAB — CBC
Hemoglobin: 12.8 g/dL — ABNORMAL LOW (ref 13.0–17.0)
RBC: 3.98 MIL/uL — ABNORMAL LOW (ref 4.22–5.81)
WBC: 5.3 10*3/uL (ref 4.0–10.5)

## 2010-06-10 LAB — COMPREHENSIVE METABOLIC PANEL
AST: 25 U/L (ref 0–37)
Albumin: 3.7 g/dL (ref 3.5–5.2)
BUN: 19 mg/dL (ref 6–23)
CO2: 26 mEq/L (ref 19–32)
Calcium: 9 mg/dL (ref 8.4–10.5)
Chloride: 110 mEq/L (ref 96–112)
Creatinine, Ser: 0.77 mg/dL (ref 0.4–1.5)
Glucose, Bld: 99 mg/dL (ref 70–99)
Potassium: 3.8 mEq/L (ref 3.5–5.1)
Sodium: 142 mEq/L (ref 135–145)
Total Bilirubin: 1 mg/dL (ref 0.3–1.2)
Total Protein: 5.7 g/dL — ABNORMAL LOW (ref 6.0–8.3)

## 2010-06-10 LAB — URINALYSIS, ROUTINE W REFLEX MICROSCOPIC
Ketones, ur: NEGATIVE mg/dL
Nitrite: NEGATIVE
pH: 5.5 (ref 5.0–8.0)

## 2010-06-10 LAB — DIFFERENTIAL
Basophils Absolute: 0 10*3/uL (ref 0.0–0.1)
Lymphocytes Relative: 30 % (ref 12–46)
Lymphs Abs: 1.6 10*3/uL (ref 0.7–4.0)
Monocytes Absolute: 0.5 10*3/uL (ref 0.1–1.0)
Monocytes Relative: 9 % (ref 3–12)
Neutro Abs: 3.1 10*3/uL (ref 1.7–7.7)

## 2010-06-10 LAB — URINE MICROSCOPIC-ADD ON

## 2010-06-10 LAB — APTT: aPTT: 31 seconds (ref 24–37)

## 2010-06-29 ENCOUNTER — Other Ambulatory Visit: Payer: Self-pay

## 2010-06-29 MED ORDER — ZOLPIDEM TARTRATE 10 MG PO TABS: 10.0000 mg | ORAL_TABLET | Freq: Every evening | ORAL | Status: DC | PRN

## 2010-06-29 NOTE — Telephone Encounter (Signed)
Done hardcopy to dahlia/LIM B  

## 2010-06-29 NOTE — Telephone Encounter (Signed)
[facility] requesting refill Zolpidem 10 mg last refill 11/13/09 #90 with 1 refill and last OV 04/12/2010.

## 2010-06-29 NOTE — Telephone Encounter (Signed)
Rx faxed to pharmacy  

## 2010-07-14 NOTE — Op Note (Signed)
Raymond Logan, Raymond Logan              ACCOUNT NO.:  [id number]   MEDICAL RECORD NO.:  [id number]          PATIENT TYPE:  AMB   LOCATION:  DAY                          FACILITY[facility]  [facility]   PHYSICIAN:  Georges Lynch. Gioffre, M.D.DATE OF BIRTH:  [birth year]   DATE OF PROCEDURE:  06/07/2008  DATE OF DISCHARGE:                               OPERATIVE REPORT   SURGEON:  Georges Lynch. Darrelyn Hillock, M.D.   ASSISTANT:  Nurse.   PREOPERATIVE DIAGNOSES:  1. Torn rotator cuff tendon left shoulder.  2. Severe impingement syndrome left shoulder.  3. Degenerative arthritis AC (acromioclavicular) joint left shoulder.   PROCEDURE IN DETAIL:  Under general anesthesia, routine orthopedic prep  and draping of the left upper extremity was carried out.  He had 1 gram  of IV Ancef preop.  At this time an incision was made over the anterior  aspect of the left shoulder.  Bleeders identified and cauterized.  Following that I went down and inserted self-retaining retractors and I  stripped the deltoid tendon from the acromion in the usual fashion.  I  then split the proximal part of the deltoid tendon muscle.  Following  that I exposed the subacromial joint.  He had severe impingement.  The  acromion literally was imbedding down into his cuff.  I protected the  cuff with a Bennett retractor, utilized the oscillating saw and the bur  to do an open acromionectomy and acromioplasty.  Once I reestablished  the subacromial space I went down and repaired the rotator cuff tendon.  I utilized a 3 x 3 TissueMend graft with one anchor and reinforced the  tendon.  I thoroughly irrigated out the area.  I bone waxed the  undersurface of the acromion.  I then also removed some spurs from the  [facility] joint to open up the [facility] joint from the acromial side.  I thoroughly  irrigated out the area and reapproximated deltoid tendon muscle in the  usual fashion.  Subcu was closed with 0 Vicryl, skin with metal staples.  Sterile Neosporin dressing was  applied.  The patient was placed in a  shoulder immobilizer.           ______________________________  Georges Lynch Darrelyn Hillock, M.D.     RAG/MEDQ  D:  06/07/2008  T:  06/07/2008  Job:  [id number]

## 2010-07-17 NOTE — Assessment & Plan Note (Signed)
[facility]                                   ON-CALL NOTE   NAME:Raymond Logan, Kessinger                     MRN:          [id number]  DATE:01/03/2006                            DOB:          03/18/1930    DATE OF INTERACTION:  January 03, 2006 at 8:30 p.m.  Phone number is [phone].   OBJECTIVE:  Patient has questions about asthma and saw Dr. Everardo All today for  a sore throat. Was given a prescription for amoxicillin.  After he got the  medicine home and had taken one dose, realized that this medicine do not  take if allergic to penicillin, which he has been allergic to for over 50  years. Had a reaction to penicillin use for sore throat when he was in the  [employer] 50 years ago.   ASSESSMENT:  Possible allergy to medicine he is prescribed.   PLAN:  If he starts having symptoms go to the emergency room, otherwise  tomorrow morning call for a change in medication.  There is a very good  chance he may not actually be allergic to the medicine that will cause a  problem.  Again, go to the ER if a problem, especially with breathing.  Primary care Kahiau Schewe is Dr. Everardo All.  Home office is [place].    ______________________________  Arta Silence, MD    RNS/MedQ  DD: 01/03/2006  DT: 01/04/2006  Job #: [id number]   cc:   Gregary Signs A. Everardo All, MD

## 2010-07-17 NOTE — Procedures (Signed)
[place]  Patient:    ROSHAWN, AYALA Visit Number: [id number] MRN: [id number]          Service Type: Attending:  Barbette Hair. Arlyce Dice, M.D. [facility] Proc. Date: 10/17/00   CC:         Corwin Levins, M.D. [facility]   Procedure Report  PROCEDURE PERFORMED:  Colonoscopy.  ENDOSCOPIST:  Barbette Hair. Arlyce Dice, M.D. [facility]  HISTORY:  The patient has recurrent rectal bleeding.  INFORMED CONSENT:  The patient provided consent after risks, benefits and alternatives were explained.  MEDICATIONS USED:  Versed, fentanyl 60 mcg IV.  DESCRIPTION OF PROCEDURE:  The patient was placed in the left lateral decubitus position, administered continuous low-flow oxygen and was placed on pulse oximetry.  The Olympus video colonoscope was inserted to the cecum which was identified by the ileocecal valve.  Prep was good.  Scope was then withdrawn.  All areas of the colon were examined.  FINDINGS: 1. Internal hemorrhoids were seen on retroflex view of the rectal vault. 2. Normal sigmoid, descending, transverse, ascending colon and cecum.  IMPRESSION: 1. Internal hemorrhoids. 2. Rectal bleeding secondary to 1.  RECOMMENDATIONS:  Continue Anusol suppositories p.r.n. for bleeding.  If this is a persistent or recurrent problem, I would consider proceeding with band ligation of hemorrhoids. Attending:  Barbette Hair. Arlyce Dice, M.D. [facility] DD:  10/17/00 TD:  10/18/00 Job: [id number]

## 2010-11-30 ENCOUNTER — Encounter: Payer: Self-pay | Admitting: Internal Medicine

## 2010-11-30 DIAGNOSIS — Z0000 Encounter for general adult medical examination without abnormal findings: Secondary | ICD-10-CM | POA: Insufficient documentation

## 2010-12-01 ENCOUNTER — Encounter: Payer: Self-pay | Admitting: Internal Medicine

## 2010-12-01 ENCOUNTER — Ambulatory Visit (INDEPENDENT_AMBULATORY_CARE_PROVIDER_SITE_OTHER)
Admission: RE | Admit: 2010-12-01 | Discharge: 2010-12-01 | Disposition: A | Discharge status: Home / Self Care | Payer: Medicare Other | Source: Ambulatory Visit | Attending: Internal Medicine | Admitting: Internal Medicine

## 2010-12-01 ENCOUNTER — Ambulatory Visit (INDEPENDENT_AMBULATORY_CARE_PROVIDER_SITE_OTHER): Payer: Medicare Other | Admitting: Internal Medicine

## 2010-12-01 VITALS — BP 112/58 | HR 67 | Temp 98.6°F | Wt 161.8 lb

## 2010-12-01 DIAGNOSIS — E785 Hyperlipidemia, unspecified: Secondary | ICD-10-CM

## 2010-12-01 DIAGNOSIS — M549 Dorsalgia, unspecified: Secondary | ICD-10-CM | POA: Insufficient documentation

## 2010-12-01 DIAGNOSIS — R0600 Dyspnea, unspecified: Secondary | ICD-10-CM | POA: Insufficient documentation

## 2010-12-01 DIAGNOSIS — R0989 Other specified symptoms and signs involving the circulatory and respiratory systems: Secondary | ICD-10-CM

## 2010-12-01 DIAGNOSIS — Z23 Encounter for immunization: Secondary | ICD-10-CM

## 2010-12-01 DIAGNOSIS — J449 Chronic obstructive pulmonary disease, unspecified: Secondary | ICD-10-CM

## 2010-12-01 DIAGNOSIS — R0609 Other forms of dyspnea: Secondary | ICD-10-CM

## 2010-12-01 MED ORDER — CYCLOBENZAPRINE HCL 5 MG PO TABS: 5.0000 mg | ORAL_TABLET | Freq: Three times a day (TID) | ORAL | Status: AC | PRN

## 2010-12-01 NOTE — Progress Notes (Signed)
Subjective:    Patient ID: Raymond Logan, male    DOB: 19-Jun-1930, 77 y.o.   MRN: [id number]  HPI  Here with 2 months intermittent mild to mod left upper back pain near the shoulder blade;  Non radiating, but sharp flares occur worse to sitting in a high back chair or lying down at night;  Did see derm with a skin lesion burned off a few month ago but does not think related.  Did see Dr Ethelene Hal for LBP and had ESI, and pt was hoping might help the left upper back pain. Can be worse with moving the shoulder a certain way, and interstingly no pain with playing basketball as he does on a regular basis, only notices afterwards. Pt denies chest pain, increased sob or doe, wheezing, orthopnea, PND, increased LE swelling, palpitations, dizziness or syncope. Pt denies new neurological symptoms such as new headache, or facial or extremity weakness or numbness.   Pt denies polydipsia, polyuria, except had a sense of SOB with exertion such as he had with his last visit that resolved, then incidentally  Noticed again this am with playing basketball - just "couldnt get a deep breath."  - like " I couldn't get my second wind." W/u at last visit with mild COPD found, o/w neg eval.  Pt denies fever, wt loss, night sweats, loss of appetite, or other constitutional symptoms Past Medical History  Diagnosis Date  . ALLERGIC RHINITIS 01/23/2007  . BENIGN PROSTATIC HYPERTROPHY 09/10/2006  . COPD, MILD 04/03/2010  . FATIGUE 01/08/2008  . GERD 04/15/2010  . Headache 09/10/2006  . HOARSENESS 04/15/2010  . HYPERLIPIDEMIA 09/10/2006  . INSOMNIA-SLEEP DISORDER-UNSPEC 04/24/2009  . PEPTIC ULCER DISEASE 01/23/2007   No past surgical history on file.  reports that he has never smoked. He does not have any smokeless tobacco history on file. He reports that he does not use illicit drugs. His alcohol history not on file. family history is not on file. Allergies  Allergen Reactions  . Penicillins     REACTION: hives   No current  outpatient prescriptions on file prior to visit.   Review of Systems Review of Systems  Constitutional: Negative for diaphoresis and unexpected weight change.  HENT: Negative for drooling and tinnitus.   Eyes: Negative for photophobia and visual disturbance.  Respiratory: Negative for choking and stridor.   Gastrointestinal: Negative for vomiting and blood in stool.  Genitourinary: Negative for hematuria and decreased urine volume.  Musculoskeletal: Negative for gait problem.  Skin: Negative for color change and wound.  Neurological: Negative for tremors and numbness.  Psychiatric/Behavioral: Negative for decreased concentration. The patient is not hyperactive.      Objective:   Physical Exam BP 112/58  Pulse 67  Temp(Src) 98.6 F (37 C) (Oral)  Wt 161 lb 12.8 oz (73.392 kg)  SpO2 97% Physical Exam  VS noted, not ill appearing Constitutional: Pt appears well-developed and well-nourished.  HENT: Head: Normocephalic.  Right Ear: External ear normal.  Left Ear: External ear normal.  Bilat tm's mild erythema.  Sinus nontender.  Pharynx mild erythema Eyes: Conjunctivae and EOM are normal. Pupils are equal, round, and reactive to light.  Neck: Normal range of motion. Neck supple.  Cardiovascular: Normal rate and regular rhythm.   Pulmonary/Chest: Effort normal and breath sounds normal.  Abd:  Soft, NT, non-distended, + BS Neurological: Pt is alert. No cranial nerve deficit.  Skin: Skin is warm. No erythema.  Psychiatric: Pt behavior is normal. Thought content normal. not significant  nervous or depressed affect MSK:  Tender left paravertebral area approx t5 without redness, swelling, rash or other skin change; left shoulder NT, FROm, no swelling    Assessment & Plan:

## 2010-12-01 NOTE — Progress Notes (Signed)
Quick Note:  Voice message left on PhoneTree system - lab is negative, normal or otherwise stable, pt to continue same tx ______ 

## 2010-12-01 NOTE — Assessment & Plan Note (Signed)
Left medial periscapular, mild tender but no redness, swelling, ulcer or other skin change - suspect MSK strain such as rhomboid vs neuritic;  For flexeril prn

## 2010-12-01 NOTE — Assessment & Plan Note (Signed)
O/w stable overall by hx and exam, most recent data reviewed with pt, and pt to continue medical treatment as before  SpO2 Readings from Last 3 Encounters:  12/01/10 97%  04/15/10 97%  03/27/10 98%

## 2010-12-01 NOTE — Patient Instructions (Addendum)
Take all new medications as prescribed - the muscle relaxer as needed (flexeril) Please go to XRAY in the Basement for the x-ray test - due to the shortness of breath and the pain Please call the phone number [phone] (the PhoneTree System) for results of testing in 2-3 days;  When calling, simply dial the number, and when prompted enter the MRN number above (the Medical Record Number) and the # key, then the message should start. Continue all other medications as before Please return in 3 months, or sooner if needed

## 2010-12-01 NOTE — Assessment & Plan Note (Signed)
Exam benign  - ? Anxiety, for cxr , o/w  to f/u any worsening symptoms or concerns

## 2010-12-01 NOTE — Assessment & Plan Note (Signed)
D/w pt, declines statin for now, for lower chol diet,  to f/u any worsening symptoms or concerns  Lab Results  Component Value Date   LDLCALC 102* 01/08/2008

## 2011-03-04 DIAGNOSIS — M19079 Primary osteoarthritis, unspecified ankle and foot: Secondary | ICD-10-CM | POA: Diagnosis not present

## 2011-03-04 DIAGNOSIS — M79609 Pain in unspecified limb: Secondary | ICD-10-CM | POA: Diagnosis not present

## 2011-03-29 DIAGNOSIS — N401 Enlarged prostate with lower urinary tract symptoms: Secondary | ICD-10-CM | POA: Diagnosis not present

## 2011-05-11 ENCOUNTER — Other Ambulatory Visit: Payer: Self-pay

## 2011-05-11 MED ORDER — ZOLPIDEM TARTRATE 10 MG PO TABS: 10.0000 mg | ORAL_TABLET | Freq: Every evening | ORAL | Status: DC | PRN

## 2011-05-11 NOTE — Telephone Encounter (Signed)
Done hardcopy to robin  

## 2011-05-11 NOTE — Telephone Encounter (Signed)
Faxed hardcopy to pharmacy. 

## 2011-05-24 DIAGNOSIS — M79609 Pain in unspecified limb: Secondary | ICD-10-CM | POA: Diagnosis not present

## 2011-09-06 ENCOUNTER — Ambulatory Visit (INDEPENDENT_AMBULATORY_CARE_PROVIDER_SITE_OTHER): Payer: Medicare Other | Admitting: Internal Medicine

## 2011-09-06 ENCOUNTER — Encounter: Payer: Self-pay | Admitting: Internal Medicine

## 2011-09-06 VITALS — BP 120/70 | HR 62 | Temp 97.9°F | Ht 69.0 in | Wt 163.8 lb

## 2011-09-06 DIAGNOSIS — E785 Hyperlipidemia, unspecified: Secondary | ICD-10-CM

## 2011-09-06 DIAGNOSIS — R413 Other amnesia: Secondary | ICD-10-CM | POA: Insufficient documentation

## 2011-09-06 DIAGNOSIS — R5381 Other malaise: Secondary | ICD-10-CM

## 2011-09-06 DIAGNOSIS — J449 Chronic obstructive pulmonary disease, unspecified: Secondary | ICD-10-CM

## 2011-09-06 DIAGNOSIS — N320 Bladder-neck obstruction: Secondary | ICD-10-CM | POA: Insufficient documentation

## 2011-09-06 DIAGNOSIS — R5383 Other fatigue: Secondary | ICD-10-CM | POA: Diagnosis not present

## 2011-09-06 MED ORDER — GABAPENTIN 800 MG PO TABS: 800.0000 mg | ORAL_TABLET | Freq: Every day | ORAL | Status: DC

## 2011-09-06 NOTE — Assessment & Plan Note (Signed)
Etiology unclear, Exam otherwise benign, to check labs as documented, follow with expectant management  

## 2011-09-06 NOTE — Assessment & Plan Note (Signed)
stable overall by hx and exam, most recent data reviewed with pt, and pt to continue medical treatment as before SpO2 Readings from Last 3 Encounters:  09/06/11 98%  12/01/10 97%  04/15/10 97%

## 2011-09-06 NOTE — Progress Notes (Signed)
Subjective:    Patient ID: Raymond Logan, male    DOB: [birth year], 78 y.o.   MRN: [id number]  HPI  Here to f/u; has had some increased benign forgetfulness in recent yrs it seems and had normal memory eval per pscyhologist per pt approx 6-7 yrs ago;  But c/o worsening memory difficulty now in the past 3-6 months unusual for him, such as overall decreased comprehension/slowed mentation, has gotten lost driving on mult occasions, and more and more forgetfull on what he is doing going from room to room in the house.  Gets lost in paperwork he has normally taken care of in the past without difficulty.  Denies worsening depressive symptoms, suicidal ideation, or panic, and has no significant hx.  Remembers somewhat the memory tests from before, and believes he likely would not do very well if re-tested now.  Pt denies new neurological symptoms such as new headache, or facial or extremity weakness or numbness  Pt denies chest pain, increased sob or doe, wheezing, orthopnea, PND, increased LE swelling, palpitations, dizziness or syncope.   Pt denies polydipsia, polyuria.  Has not had any lab eval since late 2011.  Does have sense of mild ongoing fatigue recently, but denies signficant hypersomnolence, and still plays basketball weekly as he has done for yrs.  Mother, sister with documented dementia, and brother also showing early signs but not yet eval or tx.  Mentions to see urology as he normally does yearly , next wk Past Medical History  Diagnosis Date  . ALLERGIC RHINITIS 01/23/2007  . BENIGN PROSTATIC HYPERTROPHY 09/10/2006  . COPD, MILD 04/03/2010  . FATIGUE 01/08/2008  . GERD 04/15/2010  . Headache 09/10/2006  . HOARSENESS 04/15/2010  . HYPERLIPIDEMIA 09/10/2006  . INSOMNIA-SLEEP DISORDER-UNSPEC 04/24/2009  . PEPTIC ULCER DISEASE 01/23/2007   No past surgical history on file.  reports that he has never smoked. He does not have any smokeless tobacco history on file. He reports that he does not use  illicit drugs. His alcohol history not on file. family history is not on file. Allergies  Allergen Reactions  . Penicillins     REACTION: hives   Current Outpatient Prescriptions on File Prior to Visit  Medication Sig Dispense Refill  . gabapentin (NEURONTIN) 800 MG tablet Take 800 mg by mouth 3 (three) times daily.        . baclofen (LIORESAL) 10 MG tablet Take 10 mg by mouth daily.         Review of Systems Review of Systems  Constitutional: Negative for diaphoresis and unexpected weight change.  HENT: Negative for drooling and tinnitus.   Eyes: Negative for photophobia and visual disturbance.  Respiratory: Negative for choking and stridor.   Gastrointestinal: Negative for vomiting and blood in stool.  Genitourinary: Negative for hematuria and decreased urine volume.  Musculoskeletal: Negative for gait problem.  Skin: Negative for color change and wound.  Neurological: Negative for tremors and numbness.  Psychiatric/Behavioral: Negative for decreased concentration. The patient is not hyperactive.      Objective:   Physical Exam BP 120/70  Pulse 62  Temp 97.9 F (36.6 C) (Oral)  Ht 5\' 9"  (1.753 m)  Wt 163 lb 12 oz (74.277 kg)  BMI 24.18 kg/m2  SpO2 98% Physical Exam  VS noted Constitutional: Pt appears well-developed and well-nourished.  HENT: Head: Normocephalic.  Right Ear: External ear normal.  Left Ear: External ear normal.  Eyes: Conjunctivae and EOM are normal. Pupils are equal, round, and reactive to light.  Neck: Normal range of motion. Neck supple.  Cardiovascular: Normal rate and regular rhythm.   Pulmonary/Chest: Effort normal and breath sounds normal.  Abd:  Soft, NT, non-distended, + BS Neurological: Pt is alert. Not confused, has some recent memory dysfunction Skin: Skin is warm. No erythema.  Psychiatric: Pt behavior is normal. Not depressed affect    Assessment & Plan:

## 2011-09-06 NOTE — Patient Instructions (Addendum)
Continue all other medications as before You are given the copy of your most recent blood work You will be contacted regarding the referral for: MRI for the brain, as well as Neurology (who may ask for more blood work, or start medication) Please go to LAB in the Basement for the blood and/or urine tests to be done today You will be contacted by phone if any changes need to be made immediately.  Otherwise, you will receive a letter about your results with an explanation. Please return in 6 months, or sooner if needed

## 2011-09-06 NOTE — Assessment & Plan Note (Signed)
stable overall by hx and exam, most recent data reviewed with pt, and pt to continue medical treatment as before Lab Results  Component Value Date   LDLCALC 102* 01/08/2008

## 2011-09-06 NOTE — Assessment & Plan Note (Signed)
Recent subjuective worsening, ? Early dementia - for MRI head, b12/rpr, neuro referral and other labs as ordered today

## 2011-09-07 ENCOUNTER — Other Ambulatory Visit (INDEPENDENT_AMBULATORY_CARE_PROVIDER_SITE_OTHER): Payer: Medicare Other

## 2011-09-07 DIAGNOSIS — R5383 Other fatigue: Secondary | ICD-10-CM | POA: Diagnosis not present

## 2011-09-07 DIAGNOSIS — R413 Other amnesia: Secondary | ICD-10-CM

## 2011-09-07 DIAGNOSIS — R5381 Other malaise: Secondary | ICD-10-CM

## 2011-09-07 DIAGNOSIS — E785 Hyperlipidemia, unspecified: Secondary | ICD-10-CM

## 2011-09-07 LAB — URINALYSIS, ROUTINE W REFLEX MICROSCOPIC
Nitrite: NEGATIVE
Specific Gravity, Urine: 1.025 (ref 1.000–1.030)
pH: 5.5 (ref 5.0–8.0)

## 2011-09-07 LAB — CBC WITH DIFFERENTIAL/PLATELET
Basophils Relative: 0.4 % (ref 0.0–3.0)
Eosinophils Relative: 0.5 % (ref 0.0–5.0)
HCT: 36.9 % — ABNORMAL LOW (ref 39.0–52.0)
Lymphs Abs: 1.7 10*3/uL (ref 0.7–4.0)
Monocytes Relative: 9.2 % (ref 3.0–12.0)
Neutrophils Relative %: 67.6 % (ref 43.0–77.0)
Platelets: 284 10*3/uL (ref 150.0–400.0)
RBC: 3.95 Mil/uL — ABNORMAL LOW (ref 4.22–5.81)
WBC: 7.7 10*3/uL (ref 4.5–10.5)

## 2011-09-07 LAB — LIPID PANEL
LDL Cholesterol: 107 mg/dL — ABNORMAL HIGH (ref 0–99)
Total CHOL/HDL Ratio: 4
VLDL: 28.6 mg/dL (ref 0.0–40.0)

## 2011-09-07 LAB — TSH: TSH: 0.59 u[IU]/mL (ref 0.35–5.50)

## 2011-09-07 LAB — BASIC METABOLIC PANEL
GFR: 74.9 mL/min (ref >60.00)
Potassium: 4.3 mEq/L (ref 3.5–5.1)
Sodium: 142 mEq/L (ref 135–145)

## 2011-09-07 LAB — HEPATIC FUNCTION PANEL
AST: 17 U/L (ref 0–37)
Albumin: 3.8 g/dL (ref 3.5–5.2)

## 2011-09-08 ENCOUNTER — Encounter: Payer: Self-pay | Admitting: Internal Medicine

## 2011-09-09 DIAGNOSIS — N401 Enlarged prostate with lower urinary tract symptoms: Secondary | ICD-10-CM | POA: Diagnosis not present

## 2011-09-15 ENCOUNTER — Encounter: Payer: Self-pay | Admitting: Internal Medicine

## 2011-09-15 ENCOUNTER — Ambulatory Visit
Admission: RE | Admit: 2011-09-15 | Discharge: 2011-09-15 | Disposition: A | Discharge status: Home / Self Care | Payer: Medicare Other | Source: Ambulatory Visit | Attending: Internal Medicine | Admitting: Internal Medicine

## 2011-09-15 DIAGNOSIS — R310 Gross hematuria: Secondary | ICD-10-CM | POA: Diagnosis not present

## 2011-09-15 DIAGNOSIS — G319 Degenerative disease of nervous system, unspecified: Secondary | ICD-10-CM | POA: Diagnosis not present

## 2011-09-15 DIAGNOSIS — R413 Other amnesia: Secondary | ICD-10-CM | POA: Diagnosis not present

## 2011-09-15 DIAGNOSIS — R82998 Other abnormal findings in urine: Secondary | ICD-10-CM | POA: Diagnosis not present

## 2011-09-15 DIAGNOSIS — R972 Elevated prostate specific antigen [PSA]: Secondary | ICD-10-CM | POA: Diagnosis not present

## 2011-09-15 DIAGNOSIS — R51 Headache: Secondary | ICD-10-CM | POA: Diagnosis not present

## 2011-09-15 DIAGNOSIS — N401 Enlarged prostate with lower urinary tract symptoms: Secondary | ICD-10-CM | POA: Diagnosis not present

## 2011-09-22 DIAGNOSIS — R82998 Other abnormal findings in urine: Secondary | ICD-10-CM | POA: Diagnosis not present

## 2011-09-22 DIAGNOSIS — R3915 Urgency of urination: Secondary | ICD-10-CM | POA: Diagnosis not present

## 2011-09-22 DIAGNOSIS — R413 Other amnesia: Secondary | ICD-10-CM | POA: Diagnosis not present

## 2011-09-22 DIAGNOSIS — N400 Enlarged prostate without lower urinary tract symptoms: Secondary | ICD-10-CM | POA: Diagnosis not present

## 2011-09-28 DIAGNOSIS — R972 Elevated prostate specific antigen [PSA]: Secondary | ICD-10-CM | POA: Diagnosis not present

## 2011-09-28 DIAGNOSIS — R310 Gross hematuria: Secondary | ICD-10-CM | POA: Diagnosis not present

## 2011-09-28 DIAGNOSIS — N401 Enlarged prostate with lower urinary tract symptoms: Secondary | ICD-10-CM | POA: Diagnosis not present

## 2011-10-07 ENCOUNTER — Other Ambulatory Visit: Payer: Self-pay

## 2011-10-07 MED ORDER — ZOLPIDEM TARTRATE 10 MG PO TABS: 10.0000 mg | ORAL_TABLET | Freq: Every evening | ORAL | Status: DC | PRN

## 2011-10-07 NOTE — Telephone Encounter (Signed)
Done hardcopy to robin  

## 2011-10-08 NOTE — Telephone Encounter (Signed)
Faxed hardcopy to pharmacy. 

## 2011-10-13 ENCOUNTER — Telehealth: Payer: Self-pay | Admitting: Internal Medicine

## 2011-10-13 NOTE — Telephone Encounter (Signed)
Caller: [name]/Patient; Patient Name: Raymond Logan; PCP: Oliver Barre; Best Callback Phone Number: [phone].  Patient calling about "calcification in the aorta" which was seen by his chiropractor on his xray 10/13/11.  States no symptoms of weakness, shortness of breath, or pain anywhere.  States he plays full court basketball on a regular basis.  Was advised by his chiropractor to "let his primary care physician know about this finding."  States the chiropractor did not state if he would be sending a report of the xray findings to Dr. Jonny Ruiz.  RN advised that patient obtain a copy of the xray if possible, and bring to office for MD review.  Patient agrees.  No further questions or concerns.  Krs/can

## 2011-10-14 NOTE — Telephone Encounter (Signed)
Noted  On specific change in tx or further eval needed at this time  Can be further discussed at next OV

## 2011-11-03 DIAGNOSIS — R413 Other amnesia: Secondary | ICD-10-CM | POA: Diagnosis not present

## 2011-12-08 DIAGNOSIS — R413 Other amnesia: Secondary | ICD-10-CM | POA: Diagnosis not present

## 2011-12-20 DIAGNOSIS — Z23 Encounter for immunization: Secondary | ICD-10-CM | POA: Diagnosis not present

## 2011-12-22 DIAGNOSIS — R972 Elevated prostate specific antigen [PSA]: Secondary | ICD-10-CM | POA: Diagnosis not present

## 2011-12-22 DIAGNOSIS — C61 Malignant neoplasm of prostate: Secondary | ICD-10-CM | POA: Diagnosis not present

## 2011-12-29 DIAGNOSIS — R972 Elevated prostate specific antigen [PSA]: Secondary | ICD-10-CM | POA: Diagnosis not present

## 2011-12-29 DIAGNOSIS — N401 Enlarged prostate with lower urinary tract symptoms: Secondary | ICD-10-CM | POA: Diagnosis not present

## 2012-01-12 DIAGNOSIS — H43819 Vitreous degeneration, unspecified eye: Secondary | ICD-10-CM | POA: Diagnosis not present

## 2012-01-12 DIAGNOSIS — H2510 Age-related nuclear cataract, unspecified eye: Secondary | ICD-10-CM | POA: Diagnosis not present

## 2012-01-20 DIAGNOSIS — M5137 Other intervertebral disc degeneration, lumbosacral region: Secondary | ICD-10-CM | POA: Diagnosis not present

## 2012-01-20 DIAGNOSIS — G894 Chronic pain syndrome: Secondary | ICD-10-CM | POA: Diagnosis not present

## 2012-01-20 DIAGNOSIS — M545 Low back pain: Secondary | ICD-10-CM | POA: Diagnosis not present

## 2012-02-09 DIAGNOSIS — M47817 Spondylosis without myelopathy or radiculopathy, lumbosacral region: Secondary | ICD-10-CM | POA: Diagnosis not present

## 2012-02-17 DIAGNOSIS — M47817 Spondylosis without myelopathy or radiculopathy, lumbosacral region: Secondary | ICD-10-CM | POA: Diagnosis not present

## 2012-03-13 DIAGNOSIS — H25019 Cortical age-related cataract, unspecified eye: Secondary | ICD-10-CM | POA: Diagnosis not present

## 2012-03-13 DIAGNOSIS — H2510 Age-related nuclear cataract, unspecified eye: Secondary | ICD-10-CM | POA: Diagnosis not present

## 2012-03-27 ENCOUNTER — Other Ambulatory Visit: Payer: Self-pay | Admitting: Internal Medicine

## 2012-03-27 ENCOUNTER — Ambulatory Visit (INDEPENDENT_AMBULATORY_CARE_PROVIDER_SITE_OTHER): Payer: Medicare Other | Admitting: Internal Medicine

## 2012-03-27 ENCOUNTER — Encounter: Payer: Self-pay | Admitting: Internal Medicine

## 2012-03-27 ENCOUNTER — Other Ambulatory Visit (INDEPENDENT_AMBULATORY_CARE_PROVIDER_SITE_OTHER): Payer: Medicare Other

## 2012-03-27 VITALS — BP 110/78 | HR 49 | Temp 97.2°F | Ht 69.0 in | Wt 163.5 lb

## 2012-03-27 DIAGNOSIS — M549 Dorsalgia, unspecified: Secondary | ICD-10-CM

## 2012-03-27 DIAGNOSIS — J449 Chronic obstructive pulmonary disease, unspecified: Secondary | ICD-10-CM | POA: Diagnosis not present

## 2012-03-27 DIAGNOSIS — R1012 Left upper quadrant pain: Secondary | ICD-10-CM

## 2012-03-27 LAB — BASIC METABOLIC PANEL
BUN: 18 mg/dL (ref 6–23)
CO2: 30 mEq/L (ref 19–32)
Calcium: 9.5 mg/dL (ref 8.4–10.5)
Chloride: 105 mEq/L (ref 96–112)
Creatinine, Ser: 0.9 mg/dL (ref 0.4–1.5)
Glucose, Bld: 102 mg/dL — ABNORMAL HIGH (ref 70–99)

## 2012-03-27 LAB — CBC WITH DIFFERENTIAL/PLATELET
Basophils Relative: 0.7 % (ref 0.0–3.0)
Eosinophils Absolute: 0.1 10*3/uL (ref 0.0–0.7)
Hemoglobin: 13.1 g/dL (ref 13.0–17.0)
Lymphocytes Relative: 26.6 % (ref 12.0–46.0)
Monocytes Relative: 8.1 % (ref 3.0–12.0)
Neutro Abs: 3.9 10*3/uL (ref 1.4–7.7)
Neutrophils Relative %: 63.5 % (ref 43.0–77.0)
RBC: 4.18 Mil/uL — ABNORMAL LOW (ref 4.22–5.81)
WBC: 6.1 10*3/uL (ref 4.5–10.5)

## 2012-03-27 LAB — HEPATIC FUNCTION PANEL
ALT: 21 U/L (ref 0–53)
Albumin: 3.9 g/dL (ref 3.5–5.2)
Bilirubin, Direct: 0.1 mg/dL (ref 0.0–0.3)
Total Protein: 6.8 g/dL (ref 6.0–8.3)

## 2012-03-27 LAB — URINALYSIS, ROUTINE W REFLEX MICROSCOPIC
Ketones, ur: NEGATIVE
Specific Gravity, Urine: 1.02 (ref 1.000–1.030)
Urine Glucose: NEGATIVE
Urobilinogen, UA: 0.2 (ref 0.0–1.0)
pH: 6 (ref 5.0–8.0)

## 2012-03-27 LAB — SEDIMENTATION RATE: Sed Rate: 28 mm/hr — ABNORMAL HIGH (ref 0–22)

## 2012-03-27 MED ORDER — CIPROFLOXACIN HCL 500 MG PO TABS: 500.0000 mg | ORAL_TABLET | Freq: Two times a day (BID) | ORAL | Status: DC

## 2012-03-27 NOTE — Assessment & Plan Note (Signed)
stable overall by history and exam, recent data reviewed with pt, and pt to continue medical treatment as before,  to f/u any worsening symptoms or concerns SpO2 Readings from Last 3 Encounters:  03/27/12 97%  09/06/11 98%  12/01/10 97%

## 2012-03-27 NOTE — Patient Instructions (Addendum)
Please continue all other medications as before, and refills have been done if requested. Please go to the LAB in the Basement (turn left off the elevator) for the tests to be done today You will be contacted by phone if any changes need to be made immediately.  Otherwise, you will receive a letter about your results with an explanation, but please check with MyChart first. Thank you for enrolling in MyChart. Please follow the instructions below to securely access your online medical record. MyChart allows you to send messages to your doctor, view your test results, renew your prescriptions, schedule appointments, and more. To Log into My Chart online, please go by [employer] or [employer] to [email or web address], or download the MyChart App from the [employer] of [employer] .  Your Username is: [email or web address] (pass [email or web address]) Please send a practice Message on Mychart later today Please call or message if the pain is not improving in the new few days, to consider referral to Gastroenterology Please return in 6 months, or sooner if needed

## 2012-03-27 NOTE — Assessment & Plan Note (Signed)
stable overall by history and exam,  and pt to continue medical treatment as before,  to f/u any worsening symptoms or concerns, doubt abd pain is radicular related to back but will need to continue to monitor

## 2012-03-27 NOTE — Assessment & Plan Note (Addendum)
Etiology unclear ? MSK vs other, exam with minimal findings, afeb, not ill appearing, no red flags such as wt loss or blood, for lab eval today, cont meds, consider trial otc prilosec, consider GI referral, has hx of PUD

## 2012-03-27 NOTE — Progress Notes (Signed)
Subjective:    Patient ID: Raymond Logan, male    DOB: 1931/02/23, 79 y.o.   MRN: [id number]  HPI  Here to f/u with acute, s/p recent cataract surgury 2 wks ago, had onset crampy LUQ pain coincidently at the same time onset with being NPO for procedure, has been intermittent, mild to mod and mostly achy, sometimes sharp and severe, no better with BM's, tends to wakd at night and pain is there, today has been more mild to mod dull aching, no radiation, no n/v, fever, chills.  Does not think he is constipated now, seems normal stools recently, no blood. No recent wt loss, has been 155 to 158 at home (163 here today).  Eating yesterday with club sandwich may have made worse.  Nothing seems to make better, no prior hx of similar pain.  No diarrhea.  No ETOH use.  No recent NSAID use except for celebrex prn for his recurrent lbp, only takes one every few days.  No there recent med changes or OTC use.  Incidentally with increased PSA the last few yrs, but PSA dropped recently, so no biopsy yet, t0 f/u at 6 mo.  Has some incomplete urination with urgency and small amounts and frequent now up to 3 times per hour.  Denies urinary symptoms such as dysuria, flank pain, hematuria.  Has had 2 colonoscopy, with last prior to about 2008 (not sure of date) at [facility]. Also s/p ESI x 4 per Dr Ethelene Hal about 4 wks ago, LBP improved since then, so far but has increase AM stiffness as the last wk. Pt denies chest pain, increased sob or doe, wheezing, orthopnea, PND, increased LE swelling, palpitations, dizziness or syncope. Pt continues to have recurring mild LBP without change in severity, fever, wt loss,  worsening LE pain/numbness/weakness, gait change or falls. Past Medical History  Diagnosis Date  . ALLERGIC RHINITIS 01/23/2007  . BENIGN PROSTATIC HYPERTROPHY 09/10/2006  . COPD, MILD 04/03/2010  . FATIGUE 01/08/2008  . GERD 04/15/2010  . Headache 09/10/2006  . HOARSENESS 04/15/2010  . HYPERLIPIDEMIA 09/10/2006  .  INSOMNIA-SLEEP DISORDER-UNSPEC 04/24/2009  . PEPTIC ULCER DISEASE 01/23/2007   No past surgical history on file.  reports that he has never smoked. He does not have any smokeless tobacco history on file. He reports that he does not use illicit drugs. His alcohol history not on file. family history is not on file. Allergies  Allergen Reactions  . Penicillins     REACTION: hives   Current Outpatient Prescriptions on File Prior to Visit  Medication Sig Dispense Refill  . baclofen (LIORESAL) 10 MG tablet Take 10 mg by mouth daily.        . finasteride (PROSCAR) 5 MG tablet Take 5 mg by mouth daily.      [name] gabapentin (NEURONTIN) 800 MG tablet Take 1 tablet (800 mg total) by mouth daily.  90 tablet  1  . zolpidem (AMBIEN) 10 MG tablet Take 1 tablet (10 mg total) by mouth at bedtime as needed for sleep.  30 tablet  5  . zolpidem (AMBIEN) 10 MG tablet Take 1 tablet (10 mg total) by mouth at bedtime as needed for sleep.  15 tablet  2  . zolpidem (AMBIEN) 10 MG tablet Take 1 tablet (10 mg total) by mouth at bedtime as needed for sleep.  30 tablet  5   Review of Systems  Constitutional: Negative for unexpected weight change, or unusual diaphoresis  HENT: Negative for tinnitus.   Eyes:  Negative for photophobia and visual disturbance.  Respiratory: Negative for choking and stridor.   Gastrointestinal: Negative for vomiting and blood in stool.  Genitourinary: Negative for hematuria and decreased urine volume.  Musculoskeletal: Negative for acute joint swelling Skin: Negative for color change and wound.  Neurological: Negative for tremors and numbness other than noted  Psychiatric/Behavioral: Negative for decreased concentration or  hyperactivity.       Objective:   Physical Exam BP 110/78  Pulse 49  Temp 97.2 F (36.2 C) (Oral)  Ht 5\' 9"  (1.753 m)  Wt 163 lb 8 oz (74.163 kg)  BMI 24.14 kg/m2  SpO2 97% VS noted,  Constitutional: Pt appears well-developed and well-nourished.  HENT: Head:  NCAT.  Right Ear: External ear normal.  Left Ear: External ear normal.  Eyes: Conjunctivae and EOM are normal. Pupils are equal, round, and reactive to light.  Neck: Normal range of motion. Neck supple.  Cardiovascular: Normal rate and regular rhythm.   Pulmonary/Chest: Effort normal and breath sounds normal.  Abd:  Soft, NT, non-distended, + BS except for mild epigastric/luq tender without guarding or rebound Neurological: Pt is alert. Not confused . Motor/gait intact Skin: Skin is warm. No erythema.  Psychiatric: Pt behavior is normal. Thought content normal. not nervous or depressed affect    Assessment & Plan:

## 2012-03-28 ENCOUNTER — Other Ambulatory Visit: Payer: Self-pay

## 2012-03-28 MED ORDER — CIPROFLOXACIN HCL 500 MG PO TABS: 500.0000 mg | ORAL_TABLET | Freq: Two times a day (BID) | ORAL | Status: DC

## 2012-04-07 ENCOUNTER — Encounter: Payer: Self-pay | Admitting: Gastroenterology

## 2012-04-15 ENCOUNTER — Other Ambulatory Visit: Payer: Self-pay

## 2012-05-08 ENCOUNTER — Ambulatory Visit (INDEPENDENT_AMBULATORY_CARE_PROVIDER_SITE_OTHER): Payer: Medicare Other | Admitting: Gastroenterology

## 2012-05-08 ENCOUNTER — Encounter: Payer: Self-pay | Admitting: Gastroenterology

## 2012-05-08 VITALS — BP 104/62 | HR 60 | Ht 70.0 in | Wt 167.6 lb

## 2012-05-08 DIAGNOSIS — R1012 Left upper quadrant pain: Secondary | ICD-10-CM | POA: Diagnosis not present

## 2012-05-08 MED ORDER — NA SULFATE-K SULFATE-MG SULF 17.5-3.13-1.6 GM/177ML PO SOLN: 1.0000 | Freq: Once | ORAL | Status: DC

## 2012-05-08 NOTE — Patient Instructions (Addendum)
You have been scheduled for a colonoscopy with propofol. Please follow written instructions given to you at your visit today.  Please pick up your prep kit at the pharmacy within the next 1-3 days. If you use inhalers (even only as needed), please bring them with you on the day of your procedure.  

## 2012-05-08 NOTE — Progress Notes (Signed)
History of Present Illness: Pleasant 79 year old male with history of peptic ulcer disease referred at the request of Dr. Jonny Ruiz for evaluation of abdominal pain. Over the past month he has noticed mild, aching intermittent pain in the left upper quadrant. Pain is spontaneous and may be partially relieved with a bowel movement. He has noticed more frequent stools that are slightly softer. There is no history of rectal bleeding or melena. He's also had a couple of episodes of severe, sharp pain in the left chest. He denies fever.  Last colonoscopy 2002 demonstrated internal hemorrhoids. Duodenal ulcer was seen at endoscopy in 2008.    Past Medical History  Diagnosis Date  . ALLERGIC RHINITIS 01/23/2007  . BENIGN PROSTATIC HYPERTROPHY 09/10/2006  . COPD, MILD 04/03/2010  . FATIGUE 01/08/2008  . GERD 04/15/2010  . Headache 09/10/2006  . HOARSENESS 04/15/2010  . HYPERLIPIDEMIA 09/10/2006  . INSOMNIA-SLEEP DISORDER-UNSPEC 04/24/2009  . PEPTIC ULCER DISEASE 01/23/2007  . Chronic headaches    Past Surgical History  Procedure Laterality Date  . Cataract extraction    . Shoulder arthroscopy      x3, L & R  . Knee arthroscopy    . Hemorrhoid banding     family history includes Prostate cancer in his brother. Current Outpatient Prescriptions  Medication Sig Dispense Refill  . Aspirin-Acetaminophen-Caffeine (EXCEDRIN EXTRA STRENGTH PO) Take 1 tablet by mouth as needed.      . baclofen (LIORESAL) 10 MG tablet Take 10 mg by mouth daily.        . Cholecalciferol (VITAMIN D3) 2000 UNITS TABS Take 1 tablet by mouth daily.      . Coenzyme Q10 (COQ-10) 100 MG CAPS Take 1 capsule by mouth daily.      . Cranberry (SM CRANBERRY) 300 MG tablet Take 300 mg by mouth 2 (two) times daily.      . finasteride (PROSCAR) 5 MG tablet Take 5 mg by mouth daily.      [name] gabapentin (NEURONTIN) 800 MG tablet Take 1 tablet (800 mg total) by mouth daily.  90 tablet  1  . Glucosamine-Chondroitin (MOVE FREE PO) Take 200 mg by mouth  daily.      . Multiple Vitamins-Minerals (MENS MULTIVITAMIN PLUS PO) Take 1 tablet by mouth daily.      . NON FORMULARY Take 1,050 mg by mouth daily. Celadrin      . Omega-3 Fatty Acids (FISH OIL) 500 MG CAPS Take 1 capsule by mouth daily.      [name] Resveratrol 250 MG CAPS Take 1 capsule by mouth daily.      [name] zolpidem (AMBIEN) 10 MG tablet Take 1 tablet (10 mg total) by mouth at bedtime as needed for sleep.  30 tablet  5   No current facility-administered medications for this visit.   Allergies as of 05/08/2012 - Review Complete 05/08/2012  Allergen Reaction Noted  . Penicillins  09/10/2006    reports that he has quit smoking. He has never used smokeless tobacco. He reports that he does not drink alcohol or use illicit drugs.     Review of Systems: Patient complains of urinary hesitancy Pertinent positive and negative review of systems were noted in the above HPI section. All other review of systems were otherwise negative.  Vital signs were reviewed in today's medical record Physical Exam: General: Well developed , well nourished, no acute distress appearing younger than his stated age Skin: anicteric Head: Normocephalic and atraumatic Eyes:  sclerae anicteric, EOMI Ears: Normal auditory acuity Mouth: No  deformity or lesions Neck: Supple, no masses or thyromegaly Lungs: Clear throughout to auscultation Heart: Regular rate and rhythm; no murmurs, rubs or bruits Abdomen: Soft, non tender and non distended. No masses, hepatosplenomegaly or hernias noted. Normal Bowel sounds Rectal:deferred Musculoskeletal: Symmetrical with no gross deformities  Skin: No lesions on visible extremities Pulses:  Normal pulses noted Extremities: No clubbing, cyanosis, edema or deformities noted Neurological: Alert oriented x 4, grossly nonfocal Cervical Nodes:  No significant cervical adenopathy Inguinal Nodes: No significant inguinal adenopathy Psychological:  Alert and cooperative. Normal mood and  affect      A

## 2012-05-08 NOTE — Assessment & Plan Note (Signed)
One month history of intermittent, mild left upper quadrant pain. He's noticed a very slight change in bowel movements characterized by softer and more frequent stools. A structural abnormality of the colon should be ruled out.  Recommendations #1 colonoscopy

## 2012-05-11 ENCOUNTER — Encounter: Payer: Self-pay | Admitting: Gastroenterology

## 2012-05-11 ENCOUNTER — Ambulatory Visit (AMBULATORY_SURGERY_CENTER): Payer: Medicare Other | Admitting: Gastroenterology

## 2012-05-11 VITALS — BP 115/58 | HR 51 | Temp 96.4°F | Resp 19 | Ht 70.0 in | Wt 167.0 lb

## 2012-05-11 DIAGNOSIS — J449 Chronic obstructive pulmonary disease, unspecified: Secondary | ICD-10-CM | POA: Diagnosis not present

## 2012-05-11 DIAGNOSIS — R1032 Left lower quadrant pain: Secondary | ICD-10-CM | POA: Diagnosis not present

## 2012-05-11 DIAGNOSIS — R490 Dysphonia: Secondary | ICD-10-CM | POA: Diagnosis not present

## 2012-05-11 DIAGNOSIS — R0989 Other specified symptoms and signs involving the circulatory and respiratory systems: Secondary | ICD-10-CM | POA: Diagnosis not present

## 2012-05-11 DIAGNOSIS — K279 Peptic ulcer, site unspecified, unspecified as acute or chronic, without hemorrhage or perforation: Secondary | ICD-10-CM | POA: Diagnosis not present

## 2012-05-11 DIAGNOSIS — R1012 Left upper quadrant pain: Secondary | ICD-10-CM

## 2012-05-11 DIAGNOSIS — K5730 Diverticulosis of large intestine without perforation or abscess without bleeding: Secondary | ICD-10-CM | POA: Diagnosis not present

## 2012-05-11 DIAGNOSIS — K219 Gastro-esophageal reflux disease without esophagitis: Secondary | ICD-10-CM | POA: Diagnosis not present

## 2012-05-11 MED ORDER — HYOSCYAMINE SULFATE 0.125 MG SL SUBL: 0.2500 mg | SUBLINGUAL_TABLET | SUBLINGUAL | Status: DC | PRN

## 2012-05-11 MED ORDER — SODIUM CHLORIDE 0.9 % IV SOLN: 500.0000 mL | INTRAVENOUS | Status: DC

## 2012-05-11 NOTE — Patient Instructions (Addendum)

## 2012-05-11 NOTE — Op Note (Signed)
[facility] [address] [place], [address]   COLONOSCOPY PROCEDURE REPORT  PATIENT: Raymond Logan, Raymond Logan  MR#: [id number] BIRTHDATE: 01/06/1931 , 81  yrs. old GENDER: Male ENDOSCOPIST: Louis Meckel, MD REFERRED BY: PROCEDURE DATE:  05/11/2012 PROCEDURE:   Colonoscopy, diagnostic , change in bowel habits ASA CLASS:   Class II INDICATIONS:abdominal pain in the upper left quadrant. MEDICATIONS: MAC sedation, administered by CRNA and propofol (Diprivan) 150mg  IV  DESCRIPTION OF PROCEDURE:   After the risks benefits and alternatives of the procedure were thoroughly explained, informed consent was obtained.  A digital rectal exam revealed no abnormalities of the rectum.   The LB [id number]  endoscope was introduced through the anus and advanced to the cecum, which was identified by both the appendix and ileocecal valve. No adverse events experienced.   The quality of the prep was Suprep excellent The instrument was then slowly withdrawn as the colon was fully examined.      COLON FINDINGS: Mild diverticulosis was noted in the transverse colon.   Mild melanosis was found.   The colon mucosa was otherwise normal.  Retroflexed views revealed no abnormalities. The time to cecum=3 minutes 57 seconds.  Withdrawal time=6 minutes 02 seconds. The scope was withdrawn and the procedure completed. COMPLICATIONS: There were no complications.  ENDOSCOPIC IMPRESSION: 1.   Mild diverticulosis was noted in the transverse colon 2.   Mild melanosis was found 3.   The colon mucosa was otherwise normal  RECOMMENDATIONS: 1.  hyomax as needed Fiber supplementation Office visit 6 weeks    eSigned:  Louis Meckel, MD 05/11/2012 2:04 PM   cc: Corwin Levins, MD and Zelphia Cairo MD   PATIENT NAME:  Raymond Logan, Raymond Logan MR#: [id number]

## 2012-05-11 NOTE — Progress Notes (Signed)
Patient did not experience any of the following events: a burn prior to discharge; a fall within the facility; wrong site/side/patient/procedure/implant event; or a hospital transfer or hospital admission upon discharge from the facility. (G8907) Patient did not have preoperative order for IV antibiotic SSI prophylaxis. (G8918)  

## 2012-05-11 NOTE — Progress Notes (Signed)
Report to pacu rn, vss, bbs=clear 

## 2012-05-12 ENCOUNTER — Telehealth: Payer: Self-pay | Admitting: *Deleted

## 2012-05-12 ENCOUNTER — Encounter: Payer: Self-pay | Admitting: Gastroenterology

## 2012-05-12 NOTE — Telephone Encounter (Signed)
  Follow up Call-  Call back number 05/11/2012  Permission to leave phone message Yes     Tried home number multiple times, it says all circuits are busy and try call again later. Did call cell number and left message on cell number to call us if questions, concerns, or problems. ewm

## 2012-05-17 DIAGNOSIS — M47817 Spondylosis without myelopathy or radiculopathy, lumbosacral region: Secondary | ICD-10-CM | POA: Diagnosis not present

## 2012-06-05 ENCOUNTER — Other Ambulatory Visit: Payer: Self-pay

## 2012-06-05 MED ORDER — ZOLPIDEM TARTRATE 10 MG PO TABS: 10.0000 mg | ORAL_TABLET | Freq: Every evening | ORAL | Status: DC | PRN

## 2012-06-05 NOTE — Telephone Encounter (Signed)
Done hardcopy to robin  

## 2012-06-05 NOTE — Telephone Encounter (Signed)
Faxed hardcopy to [facility] 

## 2012-06-20 DIAGNOSIS — R972 Elevated prostate specific antigen [PSA]: Secondary | ICD-10-CM | POA: Diagnosis not present

## 2012-06-26 ENCOUNTER — Encounter: Payer: Self-pay | Admitting: Gastroenterology

## 2012-06-26 ENCOUNTER — Ambulatory Visit (INDEPENDENT_AMBULATORY_CARE_PROVIDER_SITE_OTHER): Payer: Medicare Other | Admitting: Gastroenterology

## 2012-06-26 VITALS — BP 110/68 | HR 60 | Ht 70.0 in | Wt 165.6 lb

## 2012-06-26 DIAGNOSIS — R1012 Left upper quadrant pain: Secondary | ICD-10-CM

## 2012-06-26 NOTE — Patient Instructions (Addendum)
Follow up as needed

## 2012-06-26 NOTE — Assessment & Plan Note (Signed)
Specific etiology has not been determined. Problem is resolved.

## 2012-06-26 NOTE — Progress Notes (Signed)
History of Present Illness:  The patient has returned following colonoscopy. Exam was unremarkable except for diverticulosis and melanosis. He was given hyomax but has had no symptoms since the procedure. Specifically, he is without abdominal pain.    Review of Systems: Pertinent positive and negative review of systems were noted in the above HPI section. All other review of systems were otherwise negative.    Current Medications, Allergies, Past Medical History, Past Surgical History, Family History and Social History were reviewed in [employer] electronic medical record  Vital signs were reviewed in today's medical record. Physical Exam: General: Well developed , well nourished, no acute distress

## 2012-07-03 DIAGNOSIS — N401 Enlarged prostate with lower urinary tract symptoms: Secondary | ICD-10-CM | POA: Diagnosis not present

## 2012-07-11 DIAGNOSIS — H01009 Unspecified blepharitis unspecified eye, unspecified eyelid: Secondary | ICD-10-CM | POA: Diagnosis not present

## 2012-08-11 DIAGNOSIS — J029 Acute pharyngitis, unspecified: Secondary | ICD-10-CM | POA: Diagnosis not present

## 2012-08-22 DIAGNOSIS — Z961 Presence of intraocular lens: Secondary | ICD-10-CM | POA: Diagnosis not present

## 2012-08-22 DIAGNOSIS — H01009 Unspecified blepharitis unspecified eye, unspecified eyelid: Secondary | ICD-10-CM | POA: Diagnosis not present

## 2012-08-22 DIAGNOSIS — H2510 Age-related nuclear cataract, unspecified eye: Secondary | ICD-10-CM | POA: Diagnosis not present

## 2012-08-31 DIAGNOSIS — M5137 Other intervertebral disc degeneration, lumbosacral region: Secondary | ICD-10-CM | POA: Diagnosis not present

## 2012-09-12 DIAGNOSIS — M47817 Spondylosis without myelopathy or radiculopathy, lumbosacral region: Secondary | ICD-10-CM | POA: Diagnosis not present

## 2012-09-12 DIAGNOSIS — M5137 Other intervertebral disc degeneration, lumbosacral region: Secondary | ICD-10-CM | POA: Diagnosis not present

## 2012-09-25 ENCOUNTER — Ambulatory Visit: Payer: Medicare Other | Admitting: Internal Medicine

## 2012-09-26 ENCOUNTER — Ambulatory Visit: Payer: Medicare Other | Admitting: Internal Medicine

## 2012-11-15 ENCOUNTER — Telehealth: Payer: Self-pay | Admitting: Internal Medicine

## 2012-11-15 NOTE — Telephone Encounter (Signed)
Pt stated that they got $50 No show bill in the mail. Pt stated that he does not remember making that appt and no one called to remind him of this appt. Please advise. Pt does not want to pay for this because he doesn't know anything about this appt.

## 2012-11-16 NOTE — Telephone Encounter (Signed)
Emailed charge correction to have no-show fee removed

## 2012-12-25 DIAGNOSIS — D1801 Hemangioma of skin and subcutaneous tissue: Secondary | ICD-10-CM | POA: Diagnosis not present

## 2012-12-25 DIAGNOSIS — L821 Other seborrheic keratosis: Secondary | ICD-10-CM | POA: Diagnosis not present

## 2013-01-04 ENCOUNTER — Other Ambulatory Visit: Payer: Self-pay

## 2013-01-11 DIAGNOSIS — M47817 Spondylosis without myelopathy or radiculopathy, lumbosacral region: Secondary | ICD-10-CM | POA: Diagnosis not present

## 2013-02-08 DIAGNOSIS — M47817 Spondylosis without myelopathy or radiculopathy, lumbosacral region: Secondary | ICD-10-CM | POA: Diagnosis not present

## 2013-03-15 DIAGNOSIS — G894 Chronic pain syndrome: Secondary | ICD-10-CM | POA: Diagnosis not present

## 2013-03-15 DIAGNOSIS — M47817 Spondylosis without myelopathy or radiculopathy, lumbosacral region: Secondary | ICD-10-CM | POA: Diagnosis not present

## 2013-03-15 DIAGNOSIS — M5137 Other intervertebral disc degeneration, lumbosacral region: Secondary | ICD-10-CM | POA: Diagnosis not present

## 2013-04-12 DIAGNOSIS — M47817 Spondylosis without myelopathy or radiculopathy, lumbosacral region: Secondary | ICD-10-CM | POA: Diagnosis not present

## 2013-05-11 DIAGNOSIS — M47817 Spondylosis without myelopathy or radiculopathy, lumbosacral region: Secondary | ICD-10-CM | POA: Diagnosis not present

## 2013-05-11 DIAGNOSIS — M25519 Pain in unspecified shoulder: Secondary | ICD-10-CM | POA: Diagnosis not present

## 2013-05-11 DIAGNOSIS — M5137 Other intervertebral disc degeneration, lumbosacral region: Secondary | ICD-10-CM | POA: Diagnosis not present

## 2013-05-11 DIAGNOSIS — G894 Chronic pain syndrome: Secondary | ICD-10-CM | POA: Diagnosis not present

## 2013-06-27 DIAGNOSIS — N401 Enlarged prostate with lower urinary tract symptoms: Secondary | ICD-10-CM | POA: Diagnosis not present

## 2013-06-27 DIAGNOSIS — N139 Obstructive and reflux uropathy, unspecified: Secondary | ICD-10-CM | POA: Diagnosis not present

## 2013-07-11 DIAGNOSIS — N401 Enlarged prostate with lower urinary tract symptoms: Secondary | ICD-10-CM | POA: Diagnosis not present

## 2013-07-11 DIAGNOSIS — R972 Elevated prostate specific antigen [PSA]: Secondary | ICD-10-CM | POA: Diagnosis not present

## 2013-07-11 DIAGNOSIS — R82998 Other abnormal findings in urine: Secondary | ICD-10-CM | POA: Diagnosis not present

## 2013-07-11 DIAGNOSIS — N281 Cyst of kidney, acquired: Secondary | ICD-10-CM | POA: Diagnosis not present

## 2013-08-08 DIAGNOSIS — M25519 Pain in unspecified shoulder: Secondary | ICD-10-CM | POA: Diagnosis not present

## 2013-08-08 DIAGNOSIS — G894 Chronic pain syndrome: Secondary | ICD-10-CM | POA: Diagnosis not present

## 2013-09-05 DIAGNOSIS — Z961 Presence of intraocular lens: Secondary | ICD-10-CM | POA: Diagnosis not present

## 2013-09-05 DIAGNOSIS — H2589 Other age-related cataract: Secondary | ICD-10-CM | POA: Diagnosis not present

## 2013-10-19 DIAGNOSIS — S59919A Unspecified injury of unspecified forearm, initial encounter: Secondary | ICD-10-CM | POA: Diagnosis not present

## 2013-10-19 DIAGNOSIS — S59909A Unspecified injury of unspecified elbow, initial encounter: Secondary | ICD-10-CM | POA: Diagnosis not present

## 2013-10-19 DIAGNOSIS — S6990XA Unspecified injury of unspecified wrist, hand and finger(s), initial encounter: Secondary | ICD-10-CM | POA: Diagnosis not present

## 2013-10-19 DIAGNOSIS — M19039 Primary osteoarthritis, unspecified wrist: Secondary | ICD-10-CM | POA: Diagnosis not present

## 2013-11-14 DIAGNOSIS — Z23 Encounter for immunization: Secondary | ICD-10-CM | POA: Diagnosis not present

## 2013-12-14 ENCOUNTER — Other Ambulatory Visit: Payer: Self-pay

## 2014-03-29 DIAGNOSIS — K130 Diseases of lips: Secondary | ICD-10-CM | POA: Diagnosis not present

## 2014-04-03 ENCOUNTER — Encounter: Payer: Self-pay | Admitting: Internal Medicine

## 2014-04-03 ENCOUNTER — Ambulatory Visit (INDEPENDENT_AMBULATORY_CARE_PROVIDER_SITE_OTHER): Payer: Medicare Other | Admitting: Internal Medicine

## 2014-04-03 VITALS — BP 122/72 | HR 72 | Temp 98.2°F | Ht 69.0 in | Wt 157.0 lb

## 2014-04-03 DIAGNOSIS — M19042 Primary osteoarthritis, left hand: Secondary | ICD-10-CM | POA: Diagnosis not present

## 2014-04-03 DIAGNOSIS — J449 Chronic obstructive pulmonary disease, unspecified: Secondary | ICD-10-CM

## 2014-04-03 DIAGNOSIS — G3184 Mild cognitive impairment, so stated: Secondary | ICD-10-CM | POA: Diagnosis not present

## 2014-04-03 DIAGNOSIS — M19041 Primary osteoarthritis, right hand: Secondary | ICD-10-CM | POA: Diagnosis not present

## 2014-04-03 MED ORDER — ZOLPIDEM TARTRATE 10 MG PO TABS: 10.0000 mg | ORAL_TABLET | Freq: Every evening | ORAL | Status: DC | PRN

## 2014-04-03 MED ORDER — DONEPEZIL HCL 10 MG PO TABS: 10.0000 mg | ORAL_TABLET | Freq: Every day | ORAL | Status: DC

## 2014-04-03 MED ORDER — DONEPEZIL HCL 5 MG PO TABS
5.0000 mg | ORAL_TABLET | Freq: Every day | ORAL | Status: DC
Start: 2014-04-03 — End: 2014-05-01

## 2014-04-03 NOTE — Progress Notes (Signed)
Pre visit review using our clinic review tool, if applicable. No additional management support is needed unless otherwise documented below in the visit note. 

## 2014-04-03 NOTE — Progress Notes (Signed)
Subjective:    Patient ID: Raymond Logan, male    DOB: 06-23-1930, 81 y.o.   MRN: [id number]  HPI    Pt here with c/o ? worsening mild cognitive impairment, as has had more symtpoms with forgetting what the purpose of going to another room is with more frequency recently.  Pt denies chest pain, increased sob or doe, wheezing, orthopnea, PND, increased LE swelling, palpitations, dizziness or syncope.  Remaiins active as all his life, plays basketball full court, most recently this week did get an elbow to the left lower inner lip with small bleeding and swelling now improved.  No other head trauma, fall, Pt denies new neurological symptoms such as new headache, or facial or extremity weakness or numbness   Pt denies fever, wt loss, night sweats, loss of appetite, or other constitutional symptoms. Has no difficulty with driving, taking his meds and knowing the purpose Past Medical History  Diagnosis Date  . ALLERGIC RHINITIS 01/23/2007  . BENIGN PROSTATIC HYPERTROPHY 09/10/2006  . COPD, MILD 04/03/2010  . FATIGUE 01/08/2008  . GERD 04/15/2010  . Headache(784.0) 09/10/2006  . HOARSENESS 04/15/2010  . HYPERLIPIDEMIA 09/10/2006  . INSOMNIA-SLEEP DISORDER-UNSPEC 04/24/2009  . PEPTIC ULCER DISEASE 01/23/2007  . Chronic headaches    Past Surgical History  Procedure Laterality Date  . Cataract extraction    . Shoulder arthroscopy      x3, L & R  . Knee arthroscopy    . Hemorrhoid banding      reports that he has quit smoking. He has never used smokeless tobacco. He reports that he does not drink alcohol or use illicit drugs. family history includes Prostate cancer in his brother. Allergies  Allergen Reactions  . Penicillins     REACTION: hives   Current Outpatient Prescriptions on File Prior to Visit  Medication Sig Dispense Refill  . Aspirin-Acetaminophen-Caffeine (EXCEDRIN EXTRA STRENGTH PO) Take 1 tablet by mouth as needed.    . Cholecalciferol (VITAMIN D3) 2000 UNITS TABS Take 1 tablet by  mouth daily.    . Coenzyme Q10 (COQ-10) 100 MG CAPS Take 1 capsule by mouth daily.    . Cranberry (SM CRANBERRY) 300 MG tablet Take 300 mg by mouth 2 (two) times daily.    . finasteride (PROSCAR) 5 MG tablet Take 5 mg by mouth daily.    . Glucosamine-Chondroitin (MOVE FREE PO) Take 200 mg by mouth daily.    . Multiple Vitamins-Minerals (MENS MULTIVITAMIN PLUS PO) Take 1 tablet by mouth daily.    . NON FORMULARY Take 1,050 mg by mouth daily. Celadrin    . Omega-3 Fatty Acids (FISH OIL) 500 MG CAPS Take 1 capsule by mouth daily.    [name] Resveratrol 250 MG CAPS Take 1 capsule by mouth daily.     No current facility-administered medications on file prior to visit.    Review of Systems All otherwise neg per pt     Objective:   Physical Exam BP 122/72 mmHg  Pulse 72  Temp(Src) 98.2 F (36.8 C) (Oral)  Ht 5\' 9"  (1.753 m)  Wt 157 lb (71.215 kg)  BMI 23.17 kg/m2  SpO2 96% VS noted,  Constitutional: Pt appears well-developed, well-nourished.  HENT: Head: NCAT.  Right Ear: External ear normal.  Left Ear: External ear normal.  Eyes: . Pupils are equal, round, and reactive to light. Conjunctivae and EOM are normal Neck: Normal range of motion. Neck supple.  Cardiovascular: Normal rate and regular rhythm.   Pulmonary/Chest: Effort normal and breath  sounds without rales or wheezing.  Abd:  Soft, NT, ND, + BS Neurological: Pt is alert. Not confused, oriented to name, place, date , motor  Intact 5/5, dtr/sens symmetric Skin: Skin is warm. No rash Psychiatric: Pt behavior is normal. No agitation.      Assessment & Plan:

## 2014-04-03 NOTE — Patient Instructions (Addendum)
Please take all new medication as prescribed - the low dose aricept to start (5 mg) for one month, then increase to 10 mg per day after that  Please continue all other medications as before, and refills have been done if requested.  Please have the pharmacy call with any other refills you may need.  Please keep your appointments with your specialists as you may have planned  Please return in 3 months, or sooner if needed

## 2014-04-04 NOTE — Assessment & Plan Note (Signed)
stable overall by history and exam, recent data reviewed with pt, and pt to continue medical treatment as before,  to f/u any worsening symptoms or concerns SpO2 Readings from Last 3 Encounters:  04/03/14 96%  05/11/12 97%  03/27/12 97%

## 2014-04-04 NOTE — Assessment & Plan Note (Signed)
?   Worsening, pt reqeusts aricept trial after suggested by family, - ok for 5 mg qd x 1 mo, then 10 mg thereafter, declines neuro/psych evaluation for now,  to f/u any worsening symptoms or concerns

## 2014-05-01 ENCOUNTER — Other Ambulatory Visit: Payer: Self-pay | Admitting: Internal Medicine

## 2014-05-07 DIAGNOSIS — M5137 Other intervertebral disc degeneration, lumbosacral region: Secondary | ICD-10-CM | POA: Diagnosis not present

## 2014-05-07 DIAGNOSIS — M5136 Other intervertebral disc degeneration, lumbar region: Secondary | ICD-10-CM | POA: Diagnosis not present

## 2014-05-09 DIAGNOSIS — M5136 Other intervertebral disc degeneration, lumbar region: Secondary | ICD-10-CM | POA: Diagnosis not present

## 2014-05-14 ENCOUNTER — Telehealth: Payer: Self-pay | Admitting: Internal Medicine

## 2014-05-14 NOTE — Telephone Encounter (Signed)
Pt want to speak to the assistant concern about lab that he was Dr. Jenny Reichmann to order for him, it called H phoid bacteeria test? Please call pt

## 2014-05-15 ENCOUNTER — Other Ambulatory Visit (INDEPENDENT_AMBULATORY_CARE_PROVIDER_SITE_OTHER): Payer: Medicare Other

## 2014-05-15 ENCOUNTER — Other Ambulatory Visit: Payer: Self-pay

## 2014-05-15 DIAGNOSIS — Z0000 Encounter for general adult medical examination without abnormal findings: Secondary | ICD-10-CM

## 2014-05-15 LAB — H. PYLORI ANTIBODY, IGG: H Pylori IgG: NEGATIVE

## 2014-05-15 NOTE — Telephone Encounter (Signed)
Order placed for patient

## 2014-05-16 DIAGNOSIS — M5136 Other intervertebral disc degeneration, lumbar region: Secondary | ICD-10-CM | POA: Diagnosis not present

## 2014-05-23 DIAGNOSIS — M5136 Other intervertebral disc degeneration, lumbar region: Secondary | ICD-10-CM | POA: Diagnosis not present

## 2014-06-26 DIAGNOSIS — R070 Pain in throat: Secondary | ICD-10-CM | POA: Diagnosis not present

## 2014-07-02 ENCOUNTER — Ambulatory Visit (INDEPENDENT_AMBULATORY_CARE_PROVIDER_SITE_OTHER): Payer: Federal, State, Local not specified - PPO | Admitting: Internal Medicine

## 2014-07-02 DIAGNOSIS — E785 Hyperlipidemia, unspecified: Secondary | ICD-10-CM

## 2014-07-02 DIAGNOSIS — M5136 Other intervertebral disc degeneration, lumbar region: Secondary | ICD-10-CM | POA: Diagnosis not present

## 2014-07-02 DIAGNOSIS — Z0289 Encounter for other administrative examinations: Secondary | ICD-10-CM

## 2014-07-02 NOTE — Patient Instructions (Signed)
Chart opened in error  No charge

## 2014-07-02 NOTE — Progress Notes (Signed)
Opened in error, pt not seen

## 2014-07-08 ENCOUNTER — Other Ambulatory Visit: Payer: Self-pay | Admitting: Orthopedic Surgery

## 2014-07-08 DIAGNOSIS — M7918 Myalgia, other site: Secondary | ICD-10-CM

## 2014-07-08 DIAGNOSIS — M79606 Pain in leg, unspecified: Secondary | ICD-10-CM

## 2014-07-08 DIAGNOSIS — M5136 Other intervertebral disc degeneration, lumbar region: Secondary | ICD-10-CM

## 2014-07-16 ENCOUNTER — Ambulatory Visit
Admission: RE | Admit: 2014-07-16 | Discharge: 2014-07-16 | Disposition: A | Discharge status: Home / Self Care | Payer: Medicare Other | Source: Ambulatory Visit | Attending: Orthopedic Surgery | Admitting: Orthopedic Surgery

## 2014-07-16 ENCOUNTER — Other Ambulatory Visit: Payer: Self-pay | Admitting: Orthopedic Surgery

## 2014-07-16 ENCOUNTER — Ambulatory Visit
Admission: RE | Admit: 2014-07-16 | Discharge: 2014-07-16 | Disposition: A | Discharge status: Home / Self Care | Payer: Self-pay | Source: Ambulatory Visit | Attending: Orthopedic Surgery | Admitting: Orthopedic Surgery

## 2014-07-16 DIAGNOSIS — M47817 Spondylosis without myelopathy or radiculopathy, lumbosacral region: Secondary | ICD-10-CM | POA: Diagnosis not present

## 2014-07-16 DIAGNOSIS — M5136 Other intervertebral disc degeneration, lumbar region: Secondary | ICD-10-CM

## 2014-07-16 DIAGNOSIS — M7918 Myalgia, other site: Secondary | ICD-10-CM

## 2014-07-16 DIAGNOSIS — M79606 Pain in leg, unspecified: Secondary | ICD-10-CM

## 2014-07-16 DIAGNOSIS — M4806 Spinal stenosis, lumbar region: Secondary | ICD-10-CM | POA: Diagnosis not present

## 2014-07-16 DIAGNOSIS — M51369 Other intervertebral disc degeneration, lumbar region without mention of lumbar back pain or lower extremity pain: Secondary | ICD-10-CM

## 2014-07-16 MED ORDER — IOHEXOL 180 MG/ML  SOLN
15.0000 mL | Freq: Once | INTRAMUSCULAR | Status: AC | PRN
  Administered 2014-07-16: 15 mL via INTRATHECAL

## 2014-07-16 NOTE — Discharge Instructions (Signed)

## 2014-07-17 DIAGNOSIS — N401 Enlarged prostate with lower urinary tract symptoms: Secondary | ICD-10-CM | POA: Diagnosis not present

## 2014-07-17 DIAGNOSIS — N281 Cyst of kidney, acquired: Secondary | ICD-10-CM | POA: Diagnosis not present

## 2014-07-17 DIAGNOSIS — N3941 Urge incontinence: Secondary | ICD-10-CM | POA: Diagnosis not present

## 2014-07-20 DIAGNOSIS — M5489 Other dorsalgia: Secondary | ICD-10-CM | POA: Diagnosis not present

## 2014-07-20 DIAGNOSIS — Z79899 Other long term (current) drug therapy: Secondary | ICD-10-CM | POA: Diagnosis not present

## 2014-07-20 DIAGNOSIS — M545 Low back pain: Secondary | ICD-10-CM | POA: Diagnosis not present

## 2014-07-20 DIAGNOSIS — M5432 Sciatica, left side: Secondary | ICD-10-CM | POA: Diagnosis not present

## 2014-07-20 DIAGNOSIS — M519 Unspecified thoracic, thoracolumbar and lumbosacral intervertebral disc disorder: Secondary | ICD-10-CM | POA: Diagnosis not present

## 2014-07-20 DIAGNOSIS — R51 Headache: Secondary | ICD-10-CM | POA: Diagnosis not present

## 2014-07-23 ENCOUNTER — Ambulatory Visit (INDEPENDENT_AMBULATORY_CARE_PROVIDER_SITE_OTHER): Payer: Medicare Other | Admitting: Internal Medicine

## 2014-07-23 ENCOUNTER — Encounter: Payer: Self-pay | Admitting: Internal Medicine

## 2014-07-23 VITALS — BP 116/62 | HR 62 | Temp 97.8°F | Resp 15 | Ht 69.0 in | Wt 161.0 lb

## 2014-07-23 DIAGNOSIS — T887XXA Unspecified adverse effect of drug or medicament, initial encounter: Secondary | ICD-10-CM

## 2014-07-23 DIAGNOSIS — G479 Sleep disorder, unspecified: Secondary | ICD-10-CM

## 2014-07-23 DIAGNOSIS — R413 Other amnesia: Secondary | ICD-10-CM | POA: Diagnosis not present

## 2014-07-23 DIAGNOSIS — M5136 Other intervertebral disc degeneration, lumbar region: Secondary | ICD-10-CM | POA: Diagnosis not present

## 2014-07-23 DIAGNOSIS — T50905A Adverse effect of unspecified drugs, medicaments and biological substances, initial encounter: Secondary | ICD-10-CM

## 2014-07-23 DIAGNOSIS — I951 Orthostatic hypotension: Secondary | ICD-10-CM

## 2014-07-23 NOTE — Patient Instructions (Signed)
To prevent sleep dysfunction follow these instructions for sleep hygiene. Do not read, watch TV, or eat in bed. Do not get into bed until you are ready to turn off the light &  to go to sleep. Do not ingest stimulants ( decongestants, diet pills, nicotine, caffeine) after the evening meal.Do not take daytime naps.Cardiovascular exercise, this can be as simple a program as walking, is recommended 30-45 minutes 3-4 times per week. If you're not exercising you should take 6-8 weeks to build up to this level.  Perform isometric exercise of calves  ( while seated go up on toes to count of 5 & then onto heels for 5 count). Repeat  4- 5 times prior to standing if you've been seated or supine for any significant period of time as BP drops with such positions.   Please obtain results from the [facility] and Dr Gladstone Lighter.

## 2014-07-23 NOTE — Progress Notes (Signed)
   Subjective:    Patient ID: Raymond Logan, male    DOB: [birth year], 81 y.o.   MRN: [id number]  HPI    Aricept was prescribed 12 weeks ago for memory issues by Dr Jenny Reichmann; he discontinued it 6 weeks ago because of mental "fogginess" and worsening of his memory. This also caused insomnia, loose bowels, and frontal headache. He has been using zolpidem 10 mg at bedtime to induce sleep.  His daughter has found him to be orthostatic with 30-40 point drops in his standing blood pressure. He's also reported one episode of dizziness climbing stairs. There's been slight vertigo component to this.  He was evaluated at the [facility] urgent care 5/21; extensive studies were done.Results will be requested.Last labs here were 03/27/12.HCT was 38.2 and glucose 102.  Review of chart documents last labs on record as 03/27/12. At that time glucose 102; hematocrit 38.2; and sedimentation rate 28. Hepatorenal function was normal.  Review of Systems   The symptoms are not associated with any cardiac or neurologic prodrome. He's had no visual dysfunction such as blurred vision, double vision, loss of vision.  He denies any bleeding dyscrasias.Specifically epistaxis, hemoptysis, hematuria, melena, or rectal bleeding denied. No unexplained weight loss, significant dyspepsia,dysphagia, or abdominal pain.  There is no abnormal bruising , bleeding, or difficulty stopping bleeding with injury.  He's had an extensive evaluation of back pain by his Orthopedist, DrGioffre.      Objective:   Physical Exam  Pertinent or positive findings include He is alert and oriented 3.  Blood pressure was noted to drop with standing. Specifically sitting blood pressure was 140/72 and standing 104/60.  He has dramatic DIP osteoarthritic changes with milder PIP joint changes.   General appearance :adequately nourished; in no distress. Appears younger than stated age Eyes: No conjunctival inflammation or scleral icterus is  present. Oral exam:  Lips and gums are healthy appearing.There is no oropharyngeal erythema or exudate noted. Dental hygiene is good. Heart:  Normal rate and regular rhythm. S1 and S2 normal without gallop, murmur, click, rub or other extra sounds  Lungs:Chest clear to auscultation; no wheezes, rhonchi,rales ,or rubs present.No increased work of breathing.  Abdomen: bowel sounds normal, soft and non-tender without masses, organomegaly or hernias noted.  No guarding or rebound.  Vascular : all pulses equal ; no bruits present. Skin:Warm1 & dry.  Intact without suspicious lesions or rashes ; no tenting or jaundice  Lymphatic: No lymphadenopathy is noted about the head, neck, axilla Neuro: Strength, tone & DTRs normal.       Assessment & Plan:   #1 postural hypotension.  #2 memory deficit, not documented  #3 intolerance to Aricept with insomnia, mental status changes, loose bowels, and frontal headache  Plan: See after visit summary. The lab studies done by his orthopedist and at urgent care will be reviewed and additional recommendations made. Isometrics are critical to prevent falls.

## 2014-07-26 ENCOUNTER — Telehealth: Payer: Self-pay | Admitting: Internal Medicine

## 2014-07-26 ENCOUNTER — Other Ambulatory Visit: Payer: Self-pay | Admitting: Surgical

## 2014-07-26 NOTE — Telephone Encounter (Signed)
Pt called to inquire whether Dr. Linna Darner received labs from an UC visit (see last OV). Pt advised that records were not received at this time but we would call if there were any further advisements.

## 2014-07-26 NOTE — Telephone Encounter (Signed)
Patient called in and said he was expecting a call from Dr. Linna Darner. Please call patient

## 2014-07-30 DIAGNOSIS — G43719 Chronic migraine without aura, intractable, without status migrainosus: Secondary | ICD-10-CM | POA: Diagnosis not present

## 2014-07-30 DIAGNOSIS — Z049 Encounter for examination and observation for unspecified reason: Secondary | ICD-10-CM | POA: Diagnosis not present

## 2014-08-01 NOTE — Patient Instructions (Addendum)
Raymond Logan  08/01/2014   Your procedure is scheduled on:  June 8,2016  Report to [name] Main  Entrance and follow signs to               [name] at 9:05 AM.  Call this number if you have problems the morning of surgery [phone]   Remember: ONLY 1 PERSON MAY GO WITH YOU TO SHORT STAY TO GET  READY MORNING OF [name].  Do not eat food or drink liquids :After Midnight.     Take these medicines the morning of surgery with A SIP OF WATER: Percocet if needed.,                                 You may not have any metal on your body including hair pins and              piercings  Do not wear jewelry,  lotions, powders or perfumes, deodorant                     Men may shave face and neck.   Do not bring valuables to the hospital. [name].  Contacts, dentures or bridgework may not be worn into surgery.  Leave suitcase in the car. After surgery it may be brought to your room.    [name]   Special Instructions: coughing and deep breathing exercises, leg exercises.              Please read over the following fact sheets you were given: _____________________________________________________________________             [name] - Preparing for Surgery Before surgery, you can play an important role.  Because skin is not sterile, your skin needs to be as free of germs as possible.  You can reduce the number of germs on your skin by washing with CHG (chlorahexidine gluconate) soap before surgery.  CHG is an antiseptic cleaner which kills germs and bonds with the skin to continue killing germs even after washing. Please DO NOT use if you have an allergy to CHG or antibacterial soaps.  If your skin becomes reddened/irritated stop using the CHG and inform your nurse when you arrive at Short Stay. Do not shave (including legs and underarms) for at least 48 hours prior to the first CHG shower.  You may  shave your face/neck. Please follow these instructions carefully:  1.  Shower with CHG Soap the night before surgery and the  morning of Surgery.  2.  If you choose to wash your hair, wash your hair first as usual with your  normal  shampoo.  3.  After you shampoo, rinse your hair and body thoroughly to remove the  shampoo.                           4.  Use CHG as you would any other liquid soap.  You can apply chg directly  to the skin and wash                       Gently with a scrungie or clean washcloth.  5.  Apply  the CHG Soap to your body ONLY FROM THE NECK DOWN.   Do not use on face/ open                           Wound or open sores. Avoid contact with eyes, ears mouth and genitals (private parts).                       Wash face,  Genitals (private parts) with your normal soap.             6.  Wash thoroughly, paying special attention to the area where your surgery  will be performed.  7.  Thoroughly rinse your body with warm water from the neck down.  8.  DO NOT shower/wash with your normal soap after using and rinsing off  the CHG Soap.                9.  Pat yourself dry with a clean towel.            10.  Wear clean pajamas.            11.  Place clean sheets on your bed the night of your first shower and do not  sleep with pets. Day of Surgery : Do not apply any lotions/deodorants the morning of surgery.  Please wear clean clothes to the hospital/surgery center.  FAILURE TO FOLLOW THESE INSTRUCTIONS MAY RESULT IN THE CANCELLATION OF YOUR SURGERY PATIENT SIGNATURE_________________________________  NURSE SIGNATURE__________________________________  ________________________________________________________________________   Raymond Logan  An incentive spirometer is a tool that can help keep your lungs clear and active. This tool measures how well you are filling your lungs with each breath. Taking long deep breaths may help reverse or decrease the chance of developing  breathing (pulmonary) problems (especially infection) following:  A long period of time when you are unable to move or be active. BEFORE THE PROCEDURE   If the spirometer includes an indicator to show your best effort, your nurse or respiratory therapist will set it to a desired goal.  If possible, sit up straight or lean slightly forward. Try not to slouch.  Hold the incentive spirometer in an upright position. INSTRUCTIONS FOR USE   Sit on the edge of your bed if possible, or sit up as far as you can in bed or on a chair.  Hold the incentive spirometer in an upright position.  Breathe out normally.  Place the mouthpiece in your mouth and seal your lips tightly around it.  Breathe in slowly and as deeply as possible, raising the piston or the ball toward the top of the column.  Hold your breath for 3-5 seconds or for as long as possible. Allow the piston or ball to fall to the bottom of the column.  Remove the mouthpiece from your mouth and breathe out normally.  Rest for a few seconds and repeat Steps 1 through 7 at least 10 times every 1-2 hours when you are awake. Take your time and take a few normal breaths between deep breaths.  The spirometer may include an indicator to show your best effort. Use the indicator as a goal to work toward during each repetition.  After each set of 10 deep breaths, practice coughing to be sure your lungs are clear. If you have an incision (the cut made at the time of surgery), support your incision when coughing by placing a pillow or rolled  up towels firmly against it. Once you are able to get out of bed, walk around indoors and cough well. You may stop using the incentive spirometer when instructed by your caregiver.  RISKS AND COMPLICATIONS  Take your time so you do not get dizzy or light-headed.  If you are in pain, you may need to take or ask for pain medication before doing incentive spirometry. It is harder to take a deep breath if you  are having pain. AFTER USE  Rest and breathe slowly and easily.  It can be helpful to keep track of a log of your progress. Your caregiver can provide you with a simple table to help with this. If you are using the spirometer at home, follow these instructions: [place] IF:   You are having difficultly using the spirometer.  You have trouble using the spirometer as often as instructed.  Your pain medication is not giving enough relief while using the spirometer.  You develop fever of 100.5 F (38.1 C) or higher. SEEK IMMEDIATE MEDICAL CARE IF:   You cough up bloody sputum that had not been present before.  You develop fever of 102 F (38.9 C) or greater.  You develop worsening pain at or near the incision site. MAKE SURE YOU:   Understand these instructions.  Will watch your condition.  Will get help right away if you are not doing well or get worse. Document Released: 06/28/2006 Document Revised: 05/10/2011 Document Reviewed: 08/29/2006 ExitCare Patient Information 2014 Exit[employer], [place].   ________________________________________________________________________  WHAT IS A BLOOD TRANSFUSION? Blood Transfusion Information  A transfusion is the replacement of blood or some of its parts. Blood is made up of multiple cells which provide different functions.  Red blood cells carry oxygen and are used for blood loss replacement.  White blood cells fight against infection.  Platelets control bleeding.  Plasma helps clot blood.  Other blood products are available for specialized needs, such as hemophilia or other clotting disorders. BEFORE THE TRANSFUSION  Who gives blood for transfusions?   Healthy volunteers who are fully evaluated to make sure their blood is safe. This is blood bank blood. Transfusion therapy is the safest it has ever been in the practice of medicine. Before blood is taken from a donor, a complete history is taken to make sure that person has  no history of diseases nor engages in risky social behavior (examples are intravenous drug use or sexual activity with multiple partners). The donor's travel history is screened to minimize risk of transmitting infections, such as malaria. The donated blood is tested for signs of infectious diseases, such as HIV and hepatitis. The blood is then tested to be sure it is compatible with you in order to minimize the chance of a transfusion reaction. If you or a relative donates blood, this is often done in anticipation of surgery and is not appropriate for emergency situations. It takes many days to process the donated blood. RISKS AND COMPLICATIONS Although transfusion therapy is very safe and saves many lives, the main dangers of transfusion include:   Getting an infectious disease.  Developing a transfusion reaction. This is an allergic reaction to something in the blood you were given. Every precaution is taken to prevent this. The decision to have a blood transfusion has been considered carefully by your caregiver before blood is given. Blood is not given unless the benefits outweigh the risks. AFTER THE TRANSFUSION  Right after receiving a blood transfusion, you will usually feel  much better and more energetic. This is especially true if your red blood cells have gotten low (anemic). The transfusion raises the level of the red blood cells which carry oxygen, and this usually causes an energy increase.  The nurse administering the transfusion will monitor you carefully for complications. HOME CARE INSTRUCTIONS  No special instructions are needed after a transfusion. You may find your energy is better. Speak with your caregiver about any limitations on activity for underlying diseases you may have. SEEK MEDICAL CARE IF:   Your condition is not improving after your transfusion.  You develop redness or irritation at the intravenous (IV) site. SEEK IMMEDIATE MEDICAL CARE IF:  Any of the following  symptoms occur over the next 12 hours:  Shaking chills.  You have a temperature by mouth above 102 F (38.9 C), not controlled by medicine.  Chest, back, or muscle pain.  People around you feel you are not acting correctly or are confused.  Shortness of breath or difficulty breathing.  Dizziness and fainting.  You get a rash or develop hives.  You have a decrease in urine output.  Your urine turns a dark color or changes to pink, red, or brown. Any of the following symptoms occur over the next 10 days:  You have a temperature by mouth above 102 F (38.9 C), not controlled by medicine.  Shortness of breath.  Weakness after normal activity.  The white part of the eye turns yellow (jaundice).  You have a decrease in the amount of urine or are urinating less often.  Your urine turns a dark color or changes to pink, red, or brown. Document Released: 02/13/2000 Document Revised: 05/10/2011 Document Reviewed: 10/02/2007 [facility] Patient Information 2014 [place], [place].  _______________________________________________________________________

## 2014-08-02 ENCOUNTER — Encounter (HOSPITAL_COMMUNITY)
Admission: RE | Admit: 2014-08-02 | Discharge: 2014-08-02 | Disposition: A | Discharge status: Home / Self Care | Payer: Medicare Other | Source: Ambulatory Visit | Attending: Orthopedic Surgery | Admitting: Orthopedic Surgery

## 2014-08-02 ENCOUNTER — Encounter (HOSPITAL_COMMUNITY): Payer: Self-pay

## 2014-08-02 ENCOUNTER — Ambulatory Visit (HOSPITAL_COMMUNITY)
Admission: RE | Admit: 2014-08-02 | Discharge: 2014-08-02 | Disposition: A | Discharge status: Home / Self Care | Payer: Medicare Other | Source: Ambulatory Visit | Attending: Surgical | Admitting: Surgical

## 2014-08-02 DIAGNOSIS — Z01812 Encounter for preprocedural laboratory examination: Secondary | ICD-10-CM | POA: Diagnosis not present

## 2014-08-02 DIAGNOSIS — Z0183 Encounter for blood typing: Secondary | ICD-10-CM | POA: Diagnosis not present

## 2014-08-02 DIAGNOSIS — M47816 Spondylosis without myelopathy or radiculopathy, lumbar region: Secondary | ICD-10-CM | POA: Diagnosis not present

## 2014-08-02 DIAGNOSIS — M545 Low back pain: Secondary | ICD-10-CM | POA: Insufficient documentation

## 2014-08-02 DIAGNOSIS — I7090 Unspecified atherosclerosis: Secondary | ICD-10-CM | POA: Diagnosis not present

## 2014-08-02 DIAGNOSIS — Z01818 Encounter for other preprocedural examination: Secondary | ICD-10-CM | POA: Diagnosis not present

## 2014-08-02 DIAGNOSIS — M5136 Other intervertebral disc degeneration, lumbar region: Secondary | ICD-10-CM | POA: Diagnosis not present

## 2014-08-02 HISTORY — DX: Other difficulties with micturition: R39.198

## 2014-08-02 HISTORY — DX: Other specified bacterial intestinal infections: A04.8

## 2014-08-02 HISTORY — DX: Unspecified osteoarthritis, unspecified site: M19.90

## 2014-08-02 LAB — URINALYSIS, ROUTINE W REFLEX MICROSCOPIC
Bilirubin Urine: NEGATIVE
Glucose, UA: NEGATIVE mg/dL
Hgb urine dipstick: NEGATIVE
Ketones, ur: NEGATIVE mg/dL
Nitrite: NEGATIVE
Protein, ur: NEGATIVE mg/dL
Specific Gravity, Urine: 1.014 (ref 1.005–1.030)
Urobilinogen, UA: 0.2 mg/dL (ref 0.0–1.0)
pH: 6 (ref 5.0–8.0)

## 2014-08-02 LAB — COMPREHENSIVE METABOLIC PANEL
ALT: 15 U/L — ABNORMAL LOW (ref 17–63)
AST: 21 U/L (ref 15–41)
Albumin: 3.8 g/dL (ref 3.5–5.0)
Alkaline Phosphatase: 71 U/L (ref 38–126)
Anion gap: 7 (ref 5–15)
BUN: 18 mg/dL (ref 6–20)
CO2: 26 mmol/L (ref 22–32)
Calcium: 9.3 mg/dL (ref 8.9–10.3)
Chloride: 108 mmol/L (ref 101–111)
Creatinine, Ser: 0.86 mg/dL (ref 0.61–1.24)
GFR calc Af Amer: >60 mL/min (ref >60)
GFR calc non Af Amer: >60 mL/min (ref >60)
Glucose, Bld: 112 mg/dL — ABNORMAL HIGH (ref 65–99)
Potassium: 3.8 mmol/L (ref 3.5–5.1)
Sodium: 141 mmol/L (ref 135–145)
Total Bilirubin: 0.5 mg/dL (ref 0.3–1.2)
Total Protein: 6.6 g/dL (ref 6.5–8.1)

## 2014-08-02 LAB — PROTIME-INR
INR: 1.02 (ref 0.00–1.49)
Prothrombin Time: 13.6 seconds (ref 11.6–15.2)

## 2014-08-02 LAB — CBC WITH DIFFERENTIAL/PLATELET
Basophils Absolute: 0 10*3/uL (ref 0.0–0.1)
Basophils Relative: 1 % (ref 0–1)
Eosinophils Absolute: 0.1 10*3/uL (ref 0.0–0.7)
Eosinophils Relative: 1 % (ref 0–5)
HCT: 37.1 % — ABNORMAL LOW (ref 39.0–52.0)
Hemoglobin: 12.7 g/dL — ABNORMAL LOW (ref 13.0–17.0)
Lymphocytes Relative: 31 % (ref 12–46)
Lymphs Abs: 1.8 10*3/uL (ref 0.7–4.0)
MCH: 31 pg (ref 26.0–34.0)
MCHC: 34.2 g/dL (ref 30.0–36.0)
MCV: 90.5 fL (ref 78.0–100.0)
Monocytes Absolute: 0.5 10*3/uL (ref 0.1–1.0)
Monocytes Relative: 8 % (ref 3–12)
Neutro Abs: 3.5 10*3/uL (ref 1.7–7.7)
Neutrophils Relative %: 59 % (ref 43–77)
Platelets: 237 10*3/uL (ref 150–400)
RBC: 4.1 MIL/uL — ABNORMAL LOW (ref 4.22–5.81)
RDW: 12.3 % (ref 11.5–15.5)
WBC: 5.9 10*3/uL (ref 4.0–10.5)

## 2014-08-02 LAB — URINE MICROSCOPIC-ADD ON

## 2014-08-02 LAB — ABO/RH: ABO/RH(D): A POS

## 2014-08-02 LAB — SURGICAL PCR SCREEN
MRSA, PCR: NEGATIVE
Staphylococcus aureus: NEGATIVE

## 2014-08-02 NOTE — Progress Notes (Addendum)
07/23/14 LOV- Dr Linna Darner in [facility]  Surgical clearance- 07/24/2014- Dr Cathlean Cower ( PCP) - on chart

## 2014-08-02 NOTE — Progress Notes (Signed)
U/A and micro results done 08/02/2014 faxed via EPIC to Dr Gladstone Lighter.

## 2014-08-02 NOTE — Progress Notes (Signed)
Patient reports episode of hypotension at church on 07/28/2014.  B/P 80/40 per patient.  Alfuzosin was stopped on 07/28/2014.  Per office visit note of Dr Linna Darner on 07/23/2014  Dr Linna Darner aware of hypotension.   Per patient Aricept was stopped by patient due to headaches and patient states MD aware.

## 2014-08-02 NOTE — Progress Notes (Signed)
Requested and received from [facility] [place] from Dr Domingo Cocking dated 07/30/14 along with EKG done 07/30/14 on chart

## 2014-08-07 ENCOUNTER — Inpatient Hospital Stay (HOSPITAL_COMMUNITY): Payer: Medicare Other

## 2014-08-07 ENCOUNTER — Observation Stay (HOSPITAL_COMMUNITY)
Admission: RE | Admit: 2014-08-07 | Discharge: 2014-08-09 | Disposition: A | Discharge status: Home Health | Payer: Medicare Other | Source: Ambulatory Visit | Attending: Orthopedic Surgery | Admitting: Orthopedic Surgery

## 2014-08-07 ENCOUNTER — Encounter (HOSPITAL_COMMUNITY)
Admission: RE | Disposition: A | Discharge status: Home Health | Payer: Self-pay | Source: Ambulatory Visit | Attending: Orthopedic Surgery

## 2014-08-07 ENCOUNTER — Inpatient Hospital Stay (HOSPITAL_COMMUNITY): Payer: Medicare Other | Admitting: Anesthesiology

## 2014-08-07 ENCOUNTER — Encounter (HOSPITAL_COMMUNITY): Payer: Self-pay | Admitting: *Deleted

## 2014-08-07 DIAGNOSIS — Z79899 Other long term (current) drug therapy: Secondary | ICD-10-CM | POA: Insufficient documentation

## 2014-08-07 DIAGNOSIS — Z9889 Other specified postprocedural states: Secondary | ICD-10-CM | POA: Diagnosis not present

## 2014-08-07 DIAGNOSIS — Z419 Encounter for procedure for purposes other than remedying health state, unspecified: Secondary | ICD-10-CM

## 2014-08-07 DIAGNOSIS — Y92530 Ambulatory surgery center as the place of occurrence of the external cause: Secondary | ICD-10-CM | POA: Diagnosis not present

## 2014-08-07 DIAGNOSIS — M1990 Unspecified osteoarthritis, unspecified site: Secondary | ICD-10-CM | POA: Insufficient documentation

## 2014-08-07 DIAGNOSIS — Z87891 Personal history of nicotine dependence: Secondary | ICD-10-CM | POA: Insufficient documentation

## 2014-08-07 DIAGNOSIS — M4806 Spinal stenosis, lumbar region: Secondary | ICD-10-CM | POA: Diagnosis not present

## 2014-08-07 DIAGNOSIS — Z7982 Long term (current) use of aspirin: Secondary | ICD-10-CM | POA: Insufficient documentation

## 2014-08-07 DIAGNOSIS — E785 Hyperlipidemia, unspecified: Secondary | ICD-10-CM | POA: Diagnosis not present

## 2014-08-07 DIAGNOSIS — R51 Headache: Secondary | ICD-10-CM | POA: Diagnosis not present

## 2014-08-07 DIAGNOSIS — M48062 Spinal stenosis, lumbar region with neurogenic claudication: Secondary | ICD-10-CM | POA: Diagnosis present

## 2014-08-07 DIAGNOSIS — G4700 Insomnia, unspecified: Secondary | ICD-10-CM | POA: Diagnosis not present

## 2014-08-07 DIAGNOSIS — Z8711 Personal history of peptic ulcer disease: Secondary | ICD-10-CM | POA: Insufficient documentation

## 2014-08-07 DIAGNOSIS — R3989 Other symptoms and signs involving the genitourinary system: Secondary | ICD-10-CM | POA: Diagnosis not present

## 2014-08-07 DIAGNOSIS — Y846 Urinary catheterization as the cause of abnormal reaction of the patient, or of later complication, without mention of misadventure at the time of the procedure: Secondary | ICD-10-CM | POA: Insufficient documentation

## 2014-08-07 DIAGNOSIS — K219 Gastro-esophageal reflux disease without esophagitis: Secondary | ICD-10-CM | POA: Insufficient documentation

## 2014-08-07 DIAGNOSIS — J449 Chronic obstructive pulmonary disease, unspecified: Secondary | ICD-10-CM | POA: Diagnosis not present

## 2014-08-07 DIAGNOSIS — N400 Enlarged prostate without lower urinary tract symptoms: Secondary | ICD-10-CM | POA: Insufficient documentation

## 2014-08-07 DIAGNOSIS — R339 Retention of urine, unspecified: Secondary | ICD-10-CM | POA: Insufficient documentation

## 2014-08-07 DIAGNOSIS — S3730XA Unspecified injury of urethra, initial encounter: Secondary | ICD-10-CM | POA: Diagnosis not present

## 2014-08-07 DIAGNOSIS — M5126 Other intervertebral disc displacement, lumbar region: Secondary | ICD-10-CM | POA: Insufficient documentation

## 2014-08-07 DIAGNOSIS — M549 Dorsalgia, unspecified: Secondary | ICD-10-CM | POA: Diagnosis present

## 2014-08-07 DIAGNOSIS — M545 Low back pain, unspecified: Secondary | ICD-10-CM

## 2014-08-07 DIAGNOSIS — T8389XA Other specified complication of genitourinary prosthetic devices, implants and grafts, initial encounter: Secondary | ICD-10-CM | POA: Diagnosis not present

## 2014-08-07 HISTORY — PX: LUMBAR LAMINECTOMY/DECOMPRESSION MICRODISCECTOMY: SHX5026

## 2014-08-07 LAB — TYPE AND SCREEN
ABO/RH(D): A POS
Antibody Screen: NEGATIVE

## 2014-08-07 SURGERY — LUMBAR LAMINECTOMY/DECOMPRESSION MICRODISCECTOMY 1 LEVEL
Anesthesia: General | Site: Back | Laterality: Right

## 2014-08-07 MED ORDER — ONDANSETRON HCL 4 MG/2ML IJ SOLN: 4.0000 mg | INTRAMUSCULAR | Status: DC | PRN

## 2014-08-07 MED ORDER — MENTHOL 3 MG MT LOZG: 1.0000 | LOZENGE | OROMUCOSAL | Status: DC | PRN

## 2014-08-07 MED ORDER — VANCOMYCIN HCL IN DEXTROSE 1-5 GM/200ML-% IV SOLN
1000.0000 mg | Freq: Once | INTRAVENOUS | Status: AC
  Administered 2014-08-07: 1000 mg via INTRAVENOUS
  Filled 2014-08-07: qty 200

## 2014-08-07 MED ORDER — HYDROMORPHONE HCL 1 MG/ML IJ SOLN
0.5000 mg | INTRAMUSCULAR | Status: DC | PRN
  Administered 2014-08-07: 0.5 mg via INTRAVENOUS
  Administered 2014-08-08: 1 mg via INTRAVENOUS
  Filled 2014-08-07 (×2): qty 1

## 2014-08-07 MED ORDER — ZOLPIDEM TARTRATE 5 MG PO TABS: 5.0000 mg | ORAL_TABLET | Freq: Every evening | ORAL | Status: DC | PRN

## 2014-08-07 MED ORDER — ONDANSETRON HCL 4 MG/2ML IJ SOLN
INTRAMUSCULAR | Status: AC
  Filled 2014-08-07: qty 2

## 2014-08-07 MED ORDER — EPHEDRINE SULFATE 50 MG/ML IJ SOLN
INTRAMUSCULAR | Status: DC | PRN
  Administered 2014-08-07 (×4): 5 mg via INTRAVENOUS

## 2014-08-07 MED ORDER — HYDROMORPHONE HCL 1 MG/ML IJ SOLN
INTRAMUSCULAR | Status: AC
  Filled 2014-08-07: qty 1

## 2014-08-07 MED ORDER — ACETAMINOPHEN 325 MG PO TABS: 650.0000 mg | ORAL_TABLET | ORAL | Status: DC | PRN

## 2014-08-07 MED ORDER — PROPOFOL 10 MG/ML IV BOLUS
INTRAVENOUS | Status: DC | PRN
  Administered 2014-08-07: 120 mg via INTRAVENOUS

## 2014-08-07 MED ORDER — LACTATED RINGERS IV SOLN
INTRAVENOUS | Status: DC
  Administered 2014-08-07: 1000 mL via INTRAVENOUS

## 2014-08-07 MED ORDER — SODIUM CHLORIDE 0.9 % IR SOLN
Status: AC
  Filled 2014-08-07: qty 1

## 2014-08-07 MED ORDER — POLYETHYLENE GLYCOL 3350 17 G PO PACK: 17.0000 g | PACK | Freq: Every day | ORAL | Status: DC | PRN

## 2014-08-07 MED ORDER — BUPIVACAINE LIPOSOME 1.3 % IJ SUSP: INTRAMUSCULAR | Status: DC | PRN

## 2014-08-07 MED ORDER — THROMBIN 5000 UNITS EX SOLR
OROMUCOSAL | Status: DC | PRN
  Administered 2014-08-07: 1000 mL via TOPICAL

## 2014-08-07 MED ORDER — SUFENTANIL CITRATE 50 MCG/ML IV SOLN
INTRAVENOUS | Status: AC
  Filled 2014-08-07: qty 1

## 2014-08-07 MED ORDER — LIDOCAINE HCL (CARDIAC) 20 MG/ML IV SOLN
INTRAVENOUS | Status: AC
  Filled 2014-08-07: qty 5

## 2014-08-07 MED ORDER — MIDAZOLAM HCL 2 MG/2ML IJ SOLN
INTRAMUSCULAR | Status: AC
  Filled 2014-08-07: qty 2

## 2014-08-07 MED ORDER — BUPIVACAINE-EPINEPHRINE 0.5% -1:200000 IJ SOLN
INTRAMUSCULAR | Status: DC | PRN
  Administered 2014-08-07: 20 mL

## 2014-08-07 MED ORDER — HYDROCODONE-ACETAMINOPHEN 5-325 MG PO TABS: 1.0000 | ORAL_TABLET | ORAL | Status: DC | PRN

## 2014-08-07 MED ORDER — ACETAMINOPHEN 650 MG RE SUPP: 650.0000 mg | RECTAL | Status: DC | PRN

## 2014-08-07 MED ORDER — OXYCODONE-ACETAMINOPHEN 5-325 MG PO TABS
1.0000 | ORAL_TABLET | ORAL | Status: DC | PRN
  Administered 2014-08-07: 1 via ORAL
  Administered 2014-08-08: 2 via ORAL
  Administered 2014-08-08 (×2): 1 via ORAL
  Administered 2014-08-09: 2 via ORAL
  Administered 2014-08-09: 1 via ORAL
  Filled 2014-08-07 (×2): qty 1
  Filled 2014-08-07: qty 2
  Filled 2014-08-07 (×2): qty 1
  Filled 2014-08-07: qty 2

## 2014-08-07 MED ORDER — METHOCARBAMOL 500 MG PO TABS
500.0000 mg | ORAL_TABLET | Freq: Four times a day (QID) | ORAL | Status: DC | PRN
  Administered 2014-08-08 – 2014-08-09 (×2): 500 mg via ORAL
  Filled 2014-08-07 (×3): qty 1

## 2014-08-07 MED ORDER — GLYCOPYRROLATE 0.2 MG/ML IJ SOLN
INTRAMUSCULAR | Status: AC
  Filled 2014-08-07: qty 3

## 2014-08-07 MED ORDER — SODIUM CHLORIDE 0.9 % IJ SOLN
INTRAMUSCULAR | Status: AC
  Filled 2014-08-07: qty 10

## 2014-08-07 MED ORDER — BISACODYL 5 MG PO TBEC: 5.0000 mg | DELAYED_RELEASE_TABLET | Freq: Every day | ORAL | Status: DC | PRN

## 2014-08-07 MED ORDER — ROCURONIUM BROMIDE 100 MG/10ML IV SOLN
INTRAVENOUS | Status: DC | PRN
  Administered 2014-08-07: 5 mg via INTRAVENOUS
  Administered 2014-08-07: 20 mg via INTRAVENOUS
  Administered 2014-08-07: 40 mg via INTRAVENOUS

## 2014-08-07 MED ORDER — DEXTROSE 5 % IV SOLN
500.0000 mg | Freq: Four times a day (QID) | INTRAVENOUS | Status: DC | PRN
  Administered 2014-08-07: 500 mg via INTRAVENOUS
  Filled 2014-08-07 (×2): qty 5

## 2014-08-07 MED ORDER — GABAPENTIN 300 MG PO CAPS
600.0000 mg | ORAL_CAPSULE | Freq: Every day | ORAL | Status: DC
  Administered 2014-08-07 – 2014-08-09 (×3): 600 mg via ORAL
  Filled 2014-08-07 (×3): qty 2

## 2014-08-07 MED ORDER — LIDOCAINE HCL 1 % IJ SOLN
INTRAMUSCULAR | Status: AC
  Filled 2014-08-07: qty 20

## 2014-08-07 MED ORDER — BUPIVACAINE LIPOSOME 1.3 % IJ SUSP
20.0000 mL | Freq: Once | INTRAMUSCULAR | Status: AC
  Administered 2014-08-07: 20 mL
  Filled 2014-08-07: qty 20

## 2014-08-07 MED ORDER — HYDROMORPHONE HCL 1 MG/ML IJ SOLN
0.2500 mg | INTRAMUSCULAR | Status: DC | PRN
  Administered 2014-08-07 (×3): 0.25 mg via INTRAVENOUS

## 2014-08-07 MED ORDER — MIDAZOLAM HCL 5 MG/5ML IJ SOLN
INTRAMUSCULAR | Status: DC | PRN
  Administered 2014-08-07: 1 mg via INTRAVENOUS

## 2014-08-07 MED ORDER — PHENOL 1.4 % MT LIQD: 1.0000 | OROMUCOSAL | Status: DC | PRN

## 2014-08-07 MED ORDER — NEOSTIGMINE METHYLSULFATE 10 MG/10ML IV SOLN
INTRAVENOUS | Status: DC | PRN
  Administered 2014-08-07: 2 mg via INTRAVENOUS

## 2014-08-07 MED ORDER — FINASTERIDE 5 MG PO TABS
5.0000 mg | ORAL_TABLET | Freq: Every day | ORAL | Status: DC
  Administered 2014-08-07 – 2014-08-09 (×3): 5 mg via ORAL
  Filled 2014-08-07 (×3): qty 1

## 2014-08-07 MED ORDER — NEOSTIGMINE METHYLSULFATE 10 MG/10ML IV SOLN
INTRAVENOUS | Status: AC
  Filled 2014-08-07: qty 1

## 2014-08-07 MED ORDER — LACTATED RINGERS IV SOLN
INTRAVENOUS | Status: DC
  Administered 2014-08-07: 10:00:00 via INTRAVENOUS

## 2014-08-07 MED ORDER — SODIUM CHLORIDE 0.9 % IR SOLN
Status: DC | PRN
  Administered 2014-08-07: 500 mL

## 2014-08-07 MED ORDER — VANCOMYCIN HCL IN DEXTROSE 1-5 GM/200ML-% IV SOLN
1000.0000 mg | INTRAVENOUS | Status: AC
  Administered 2014-08-07: 1000 mg via INTRAVENOUS
  Filled 2014-08-07: qty 200

## 2014-08-07 MED ORDER — GLYCOPYRROLATE 0.2 MG/ML IJ SOLN
INTRAMUSCULAR | Status: DC | PRN
  Administered 2014-08-07: .3 mg via INTRAVENOUS

## 2014-08-07 MED ORDER — SUFENTANIL CITRATE 50 MCG/ML IV SOLN
INTRAVENOUS | Status: DC | PRN
  Administered 2014-08-07 (×5): 5 ug via INTRAVENOUS

## 2014-08-07 MED ORDER — PROPOFOL 10 MG/ML IV BOLUS
INTRAVENOUS | Status: AC
  Filled 2014-08-07: qty 20

## 2014-08-07 MED ORDER — LACTATED RINGERS IV SOLN
INTRAVENOUS | Status: DC
  Administered 2014-08-08 (×2): via INTRAVENOUS

## 2014-08-07 MED ORDER — BACITRACIN ZINC 500 UNIT/GM EX OINT
TOPICAL_OINTMENT | CUTANEOUS | Status: AC
  Filled 2014-08-07: qty 28.35

## 2014-08-07 MED ORDER — BUPIVACAINE-EPINEPHRINE (PF) 0.5% -1:200000 IJ SOLN
INTRAMUSCULAR | Status: AC
  Filled 2014-08-07: qty 30

## 2014-08-07 MED ORDER — ROCURONIUM BROMIDE 100 MG/10ML IV SOLN
INTRAVENOUS | Status: AC
  Filled 2014-08-07: qty 1

## 2014-08-07 MED ORDER — ONDANSETRON HCL 4 MG/2ML IJ SOLN
INTRAMUSCULAR | Status: DC | PRN
  Administered 2014-08-07: 4 mg via INTRAVENOUS

## 2014-08-07 MED ORDER — FLEET ENEMA 7-19 GM/118ML RE ENEM: 1.0000 | ENEMA | Freq: Once | RECTAL | Status: AC | PRN

## 2014-08-07 MED ORDER — LIDOCAINE HCL (CARDIAC) 20 MG/ML IV SOLN
INTRAVENOUS | Status: DC | PRN
  Administered 2014-08-07: 80 mg via INTRAVENOUS

## 2014-08-07 MED ORDER — LACTATED RINGERS IV SOLN
INTRAVENOUS | Status: DC
  Administered 2014-08-07 (×2): via INTRAVENOUS

## 2014-08-07 MED ORDER — THROMBIN 5000 UNITS EX SOLR
CUTANEOUS | Status: AC
  Filled 2014-08-07: qty 10000

## 2014-08-07 SURGICAL SUPPLY — 39 items
BAG SPEC THK2 15X12 ZIP CLS (MISCELLANEOUS) ×1
BAG ZIPLOCK 12X15 (MISCELLANEOUS) ×3 IMPLANT
CLEANER TIP ELECTROSURG 2X2 (MISCELLANEOUS) ×3 IMPLANT
DRAPE MICROSCOPE LEICA (MISCELLANEOUS) ×3 IMPLANT
DRAPE POUCH INSTRU U-SHP 10X18 (DRAPES) ×3 IMPLANT
DRAPE SHEET LG 3/4 BI-LAMINATE (DRAPES) ×3 IMPLANT
DRAPE SURG 17X11 SM STRL (DRAPES) ×3 IMPLANT
DRSG ADAPTIC 3X8 NADH LF (GAUZE/BANDAGES/DRESSINGS) ×3 IMPLANT
DRSG PAD ABDOMINAL 8X10 ST (GAUZE/BANDAGES/DRESSINGS) ×4 IMPLANT
DURAPREP 26ML APPLICATOR (WOUND CARE) ×3 IMPLANT
ELECT BLADE TIP CTD 4 INCH (ELECTRODE) ×3 IMPLANT
ELECT REM PT RETURN 9FT ADLT (ELECTROSURGICAL) ×3
ELECTRODE REM PT RTRN 9FT ADLT (ELECTROSURGICAL) ×1 IMPLANT
GAUZE SPONGE 4X4 12PLY STRL (GAUZE/BANDAGES/DRESSINGS) ×3 IMPLANT
GLOVE BIOGEL PI IND STRL 8 (GLOVE) ×1 IMPLANT
GLOVE BIOGEL PI INDICATOR 8 (GLOVE) ×4
GLOVE ECLIPSE 8.0 STRL XLNG CF (GLOVE) ×5 IMPLANT
GOWN STRL REUS W/TWL XL LVL3 (GOWN DISPOSABLE) ×6 IMPLANT
KIT BASIN OR (CUSTOM PROCEDURE TRAY) ×3 IMPLANT
KIT POSITIONING SURG ANDREWS (MISCELLANEOUS) ×3 IMPLANT
MANIFOLD NEPTUNE II (INSTRUMENTS) ×3 IMPLANT
NDL SPNL 18GX3.5 QUINCKE PK (NEEDLE) ×2 IMPLANT
NEEDLE HYPO 22GX1.5 SAFETY (NEEDLE) ×3 IMPLANT
NEEDLE SPNL 18GX3.5 QUINCKE PK (NEEDLE) ×9 IMPLANT
PACK LAMINECTOMY ORTHO (CUSTOM PROCEDURE TRAY) ×3 IMPLANT
PATTIES SURGICAL .5 X.5 (GAUZE/BANDAGES/DRESSINGS) ×3 IMPLANT
PATTIES SURGICAL .75X.75 (GAUZE/BANDAGES/DRESSINGS) ×2 IMPLANT
PATTIES SURGICAL 1X1 (DISPOSABLE) IMPLANT
PEN SKIN MARKING BROAD (MISCELLANEOUS) ×3 IMPLANT
SPONGE LAP 18X18 X RAY DECT (DISPOSABLE) ×2 IMPLANT
SPONGE LAP 4X18 X RAY DECT (DISPOSABLE) ×4 IMPLANT
SPONGE SURGIFOAM ABS GEL 100 (HEMOSTASIS) ×3 IMPLANT
STAPLER VISISTAT 35W (STAPLE) ×3 IMPLANT
SUT VIC AB 0 CT1 27 (SUTURE) ×3
SUT VIC AB 0 CT1 27XBRD ANTBC (SUTURE) ×1 IMPLANT
SUT VIC AB 1 CT1 27 (SUTURE) ×9
SUT VIC AB 1 CT1 27XBRD ANTBC (SUTURE) ×3 IMPLANT
SYR 20CC LL (SYRINGE) ×3 IMPLANT
TOWEL OR 17X26 10 PK STRL BLUE (TOWEL DISPOSABLE) ×3 IMPLANT

## 2014-08-07 NOTE — Anesthesia Procedure Notes (Signed)
Procedure Name: Intubation Date/Time: 08/07/2014 11:28 AM Performed by: Chyrel Masson Pre-anesthesia Checklist: Patient identified, Emergency Drugs available, Suction available, Patient being monitored and Timeout performed Patient Re-evaluated:Patient Re-evaluated prior to inductionOxygen Delivery Method: Circle system utilized Preoxygenation: Pre-oxygenation with 100% oxygen Intubation Type: IV induction Ventilation: Mask ventilation without difficulty Laryngoscope Size: Mac and 3 Grade View: Grade II Tube type: Oral Tube size: 7.5 mm Number of attempts: 1 Airway Equipment and Method: Stylet Placement Confirmation: ETT inserted through vocal cords under direct vision,  positive ETCO2 and breath sounds checked- equal and bilateral Secured at: 23 cm Tube secured with: Tape Dental Injury: Teeth and Oropharynx as per pre-operative assessment

## 2014-08-07 NOTE — Interval H&P Note (Signed)
History and Physical Interval Note:  08/07/2014 11:05 AM  Raymond Logan  has presented today for surgery, with the diagnosis of SPINAL STENOSIS  The various methods of treatment have been discussed with the patient and family. After consideration of risks, benefits and other options for treatment, the patient has consented to  Procedure(s): CENTRAL DECOMPRESSION LUMBAR LAMINECTOMY/MICRODISCECTOMY OF L4-L5 ON RIGHT (Right) as a surgical intervention .  The patient's history has been reviewed, patient examined, no change in status, stable for surgery.  I have reviewed the patient's chart and labs.  Questions were answered to the patient's satisfaction.     Brenn Gatton A

## 2014-08-07 NOTE — Plan of Care (Signed)
Problem: Consults Goal: Diagnosis - Spinal Surgery Lumbar Laminectomy (Complex)     

## 2014-08-07 NOTE — Anesthesia Postprocedure Evaluation (Signed)
  Anesthesia Post-op Note  Patient: Raymond Logan  Procedure(s) Performed: Procedure(s) (LRB): complete DECOMPRESSION LUMBAR LAMINECTOMY/MICRODISCECTOMY OF L4-L5 for spinal stenosis, DECOMPRESSION OF L3-L4 (Right)  Patient Location: PACU  Anesthesia Type: General  Level of Consciousness: awake and alert   Airway and Oxygen Therapy: Patient Spontanous Breathing  Post-op Pain: mild  Post-op Assessment: Post-op Vital signs reviewed, Patient's Cardiovascular Status Stable, Respiratory Function Stable, Patent Airway and No signs of Nausea or vomiting  Last Vitals:  Filed Vitals:   08/07/14 1524  BP: 119/59  Pulse: 50  Temp: 36.4 C  Resp: 14    Post-op Vital Signs: stable   Complications: No apparent anesthesia complications

## 2014-08-07 NOTE — Anesthesia Preprocedure Evaluation (Signed)
Anesthesia Evaluation  Patient identified by MRN, date of birth, ID band Patient awake    Reviewed: Allergy & Precautions, H&P , NPO status , Patient's Chart, lab work & pertinent test results  Airway Mallampati: II  TM Distance: >3 FB Neck ROM: full    Dental  (+) Caps, Dental Advisory Given All upper teeth are capped:   Pulmonary COPDformer smoker,  Mild COPD breath sounds clear to auscultation  Pulmonary exam normal       Cardiovascular Exercise Tolerance: Good negative cardio ROS Normal cardiovascular examRhythm:regular Rate:Normal     Neuro/Psych negative neurological ROS  negative psych ROS   GI/Hepatic negative GI ROS, Neg liver ROS, GERD-  ,  Endo/Other  negative endocrine ROS  Renal/GU negative Renal ROS  negative genitourinary   Musculoskeletal   Abdominal   Peds  Hematology negative hematology ROS (+)   Anesthesia Other Findings   Reproductive/Obstetrics negative OB ROS                             Anesthesia Physical Anesthesia Plan  ASA: II  Anesthesia Plan: General   Post-op Pain Management:    Induction: Intravenous  Airway Management Planned: Oral ETT  Additional Equipment:   Intra-op Plan:   Post-operative Plan: Extubation in OR  Informed Consent: I have reviewed the patients History and Physical, chart, labs and discussed the procedure including the risks, benefits and alternatives for the proposed anesthesia with the patient or authorized representative who has indicated his/her understanding and acceptance.   Dental Advisory Given  Plan Discussed with: CRNA and Surgeon  Anesthesia Plan Comments:         Anesthesia Quick Evaluation

## 2014-08-07 NOTE — Brief Op Note (Signed)
08/07/2014  1:46 PM  PATIENT:  [name]  81 y.o. male  PRE-OPERATIVE DIAGNOSIS:  SPINAL STENOSIS at L-3-L-4 andL-4-L-5 and Herniated Lumbar Disc at L-4-L-5 .Foraminal Stenosis involving the L-4 and L-5 Nerve Roots Bilaterally  POST-OPERATIVE DIAGNOSIS: Same as Pre-Op  PROCEDURE:  Procedure(s): complete DECOMPRESSION LUMBAR LAMINECTOMY/MICRODISCECTOMY OF L4-L5 for spinal stenosis, DECOMPRESSION OF L3-L4 (Right) for Spinal Stenosis,Complete Decompression.Foraminotomies for L-4 and L-5 Nerve Roots Bilaterally.  SURGEON:  Surgeon(s) and Role:    * Magnus Sinning, MD - Assisting    * Latanya Maudlin, MD - Primary    ASSISTANTS: Tarri Glenn MD  ANESTHESIA:   general  EBL:  Total I/O In: 1000 [I.V.:1000] Out: -   BLOOD ADMINISTERED:none  DRAINS: none   LOCAL MEDICATIONS USED:  MARCAINE  20cc of 0.50% with Epinephrine at [facility] of case and 20cc of Exparel at the end of the case.  SPECIMEN:  No Specimen  DISPOSITION OF SPECIMEN:  N/A  COUNTS:  YES  TOURNIQUET:  * No tourniquets in log *  DICTATION: .Other Dictation: Dictation Number [id number]  PLAN OF CARE: Admit for overnight observation  PATIENT DISPOSITION:  Stable in OR   Delay start of Pharmacological VTE agent (>24hrs) due to surgical blood loss or risk of bleeding: yes

## 2014-08-07 NOTE — H&P (Signed)
Raymond Logan is an 81 y.o. male.   Chief Complaint: back pain HPI: Patient presented with the chief complaint of low back pain. He started having severe pain on the right side going down in the right buttock and right thigh. MRI showed a disc herniation at L4-5 but CT myelogram showed a much more significant stenotic area at L4-L5.   Past Medical History  Diagnosis Date  . ALLERGIC RHINITIS 01/23/2007  . BENIGN PROSTATIC HYPERTROPHY 09/10/2006  . FATIGUE 01/08/2008  . GERD 04/15/2010  . Headache(784.0) 09/10/2006  . HOARSENESS 04/15/2010  . HYPERLIPIDEMIA 09/10/2006  . INSOMNIA-SLEEP DISORDER-UNSPEC 04/24/2009  . PEPTIC ULCER DISEASE 01/23/2007  . Chronic headaches   . COPD, MILD 04/03/2010    patient denies on preop visit of 08/02/2014   . Difficulty in urination   . H. pylori infection     hx of   . Arthritis     Past Surgical History  Procedure Laterality Date  . Cataract extraction    . Shoulder arthroscopy      x3, L & R  . Knee arthroscopy    . Hemorrhoid banding      Family History  Problem Relation Age of Onset  . Prostate cancer Brother    Social History:  reports that he has quit smoking. He has never used smokeless tobacco. He reports that he drinks alcohol. He reports that he does not use illicit drugs.  Allergies:  Allergies  Allergen Reactions  . Penicillins Hives       . Alfuzosin Other (See Comments)    BP went crazy, dizzy, passing out   . Aricept [Donepezil Hcl]     Insomnia, headache    Current outpatient prescriptions:  .  Aspirin-Acetaminophen-Caffeine (EXCEDRIN EXTRA STRENGTH PO), Take 1 tablet by mouth as needed (headache). , Disp: , Rfl:  .  Cholecalciferol (VITAMIN D3) 2000 UNITS TABS, Take 1 tablet by mouth daily., Disp: , Rfl:  .  Coenzyme Q10 (COQ-10) 100 MG CAPS, Take 1 capsule by mouth daily., Disp: , Rfl:  .  Cranberry (SM CRANBERRY) 300 MG tablet, Take 300 mg by mouth daily. , Disp: , Rfl:  .  Cyanocobalamin (B-12) 2500 MCG TABS, Take 1  tablet by mouth daily., Disp: , Rfl:  .  EVENING PRIMROSE OIL PO, Take 1 tablet by mouth daily., Disp: , Rfl:  .  finasteride (PROSCAR) 5 MG tablet, Take 5 mg by mouth daily., Disp: , Rfl:  .  gabapentin (NEURONTIN) 100 MG capsule, Take 1 capsule by mouth 2 (two) times daily as needed (headache). , Disp: , Rfl: 0 .  gabapentin (NEURONTIN) 600 MG tablet, Take 300-600 mg by mouth daily. Half tablet for the first week. Then increase to whole tab, Disp: , Rfl: 0 .  Glucosamine-Chondroitin (MOVE FREE PO), Take 200 mg by mouth daily., Disp: , Rfl:  .  magnesium oxide (MAG-OX) 400 MG tablet, Take 400 mg by mouth daily., Disp: , Rfl:  .  Multiple Vitamins-Minerals (MENS MULTIVITAMIN PLUS PO), Take 1 tablet by mouth daily., Disp: , Rfl:  .  NON FORMULARY, Take 1 tablet by mouth daily. Focus factor, Disp: , Rfl:  .  NON FORMULARY, Take 1 tablet by mouth daily. Memory complex, Disp: , Rfl:  .  NON FORMULARY, Take 1 tablet by mouth daily. blueberry, Disp: , Rfl:  .  Omega-3 Fatty Acids (FISH OIL) 1200 MG CAPS, Take 1 capsule by mouth daily., Disp: , Rfl:  .  oxyCODONE-acetaminophen (PERCOCET) 7.5-325 MG per tablet,  Take 1 tablet by mouth every 8 (eight) hours as needed., Disp: , Rfl: 0 .  Resveratrol 250 MG CAPS, Take 1 capsule by mouth daily., Disp: , Rfl:  .  Turmeric 500 MG CAPS, Take 2 tablets by mouth daily., Disp: , Rfl:  .  zolpidem (AMBIEN) 10 MG tablet, Take 1 tablet (10 mg total) by mouth at bedtime as needed for sleep., Disp: 30 tablet, Rfl: 5 .  alfuzosin (UROXATRAL) 10 MG 24 hr tablet, Take 10 mg by mouth daily with breakfast. Patient stopped on 07/28/2014 due to hypotension, Disp: , Rfl:  .  donepezil (ARICEPT) 10 MG tablet, Take 1 tablet (10 mg total) by mouth at bedtime.    Review of Systems  Constitutional: Negative.   HENT: Negative for congestion, ear discharge, ear pain, hearing loss, nosebleeds, sore throat and tinnitus.   Eyes: Positive for blurred vision. Negative for double vision,  photophobia, pain, discharge and redness.  Respiratory: Negative.  Negative for stridor.   Cardiovascular: Negative.   Gastrointestinal: Negative.   Genitourinary: Negative.   Musculoskeletal: Positive for back pain and joint pain. Negative for myalgias, falls and neck pain.  Skin: Negative.   Neurological: Positive for headaches. Negative for dizziness, tingling, tremors, sensory change, speech change, focal weakness, seizures and loss of consciousness.  Endo/Heme/Allergies: Negative.   Psychiatric/Behavioral: Positive for memory loss. Negative for depression, suicidal ideas, hallucinations and substance abuse. The patient is not nervous/anxious and does not have insomnia.    Vitals Weight: 157 lb Height: 70in Body Surface Area: 1.88 m Body Mass Index: 22.53 kg/m  Pulse: 61 (Regular)  BP: 126/76 (Sitting, Left Arm, Standard)   Physical Exam  Constitutional: He is oriented to person, place, and time. He appears well-developed and well-nourished. No distress.  HENT:  Head: Normocephalic and atraumatic.  Right Ear: External ear normal.  Left Ear: External ear normal.  Nose: Nose normal.  Mouth/Throat: Oropharynx is clear and moist.  Eyes: Conjunctivae and EOM are normal.  Neck: Normal range of motion. Neck supple.  Cardiovascular: Normal rate, regular rhythm, normal heart sounds and intact distal pulses.   No murmur heard. Respiratory: Effort normal and breath sounds normal. No respiratory distress. He has no wheezes.  GI: Soft. Bowel sounds are normal. He exhibits no distension. There is no tenderness.  Musculoskeletal:       Right hip: Normal.       Left hip: Normal.       Right knee: Normal.       Left knee: Normal.       Lumbar back: He exhibits pain and spasm.  Neurological: He is alert and oriented to person, place, and time. He has normal reflexes. No sensory deficit.  He has weakness on exam of his dorsiflexors and toe extensors of the right foot.  Skin: No  rash noted. He is not diaphoretic. No erythema.  Psychiatric: He has a normal mood and affect. His behavior is normal.     Assessment/Plan Lumbar spinal stenosis and disc herniation L4-L5 right He needs to have a central decompression at L4-L5 and microdiscectomy at L4-L5 on the right. The possible complications of spinal surgery number one could be infection, which is extremely rare. We do use antibiotics prior to the surgery and during surgery and after surgery. Number two is always a slight degree of probability that you could develop a blood clot in your leg after any type of surgery and we try our best to prevent that with aspirin post op when  it is safe to begin. The third is a dural leak. That is the spinal fluid leak that could occur. At certain rare times the bone or the disc could literally stick to the dura which is the lining which contains the spinal fluid and we could develop a small tear in that lining which we then patch up. That is an extremely rare complication. The last and final complication is a recurrent disc rupture. That means that you could rupture another small piece of disc later on down the road and there is about a 2% chance of that.    H&P performed by Dr. Latanya Maudlin, MD   [place], [place] 08/07/2014, 8:21 AM

## 2014-08-07 NOTE — Progress Notes (Signed)
Attempts made x 2 to insert coude catheter as per order.  First attempt with 16 fr coude, second with 18 fr coude.  Both attempts unsuccessful.  Reported to pt RN of attempts. Stacey Drain

## 2014-08-07 NOTE — Progress Notes (Signed)
ANTIBIOTIC CONSULT NOTE - INITIAL  Pharmacy Consult for vancomycin Indication: post-op prophylaxis  Allergies  Allergen Reactions  . Penicillins Hives       . Alfuzosin Other (See Comments)    BP went crazy, dizzy, passing out   . Aricept [Donepezil Hcl]     Insomnia, headache    Patient Measurements: Height: '5\' 9"'$  (175.3 cm) Weight: 161 lb (73.029 kg) IBW/kg (Calculated) : 70.7   Vital Signs: Temp: 97.5 F (36.4 C) (06/08 1524) Temp Source: Oral (06/08 0858) BP: 119/59 mmHg (06/08 1524) Pulse Rate: 50 (06/08 1524) Intake/Output from previous day:   Intake/Output from this shift: Total I/O In: 2000 [I.V.:2000] Out: 50 [Urine:50]  Labs: No results for input(s): WBC, HGB, PLT, LABCREA, CREATININE in the last 72 hours. Estimated Creatinine Clearance: 67.4 mL/min (by C-G formula based on Cr of 0.86). No results for input(s): VANCOTROUGH, VANCOPEAK, VANCORANDOM, GENTTROUGH, GENTPEAK, GENTRANDOM, TOBRATROUGH, TOBRAPEAK, TOBRARND, AMIKACINPEAK, AMIKACINTROU, AMIKACIN in the last 72 hours.   Microbiology: Recent Results (from the past 720 hour(s))  Surgical pcr screen     Status: None   Collection Time: 08/02/14 10:55 AM  Result Value Ref Range Status   MRSA, PCR NEGATIVE NEGATIVE Final   Staphylococcus aureus NEGATIVE NEGATIVE Final    Comment:        The Xpert SA Assay (FDA approved for NASAL specimens in patients over 63 years of age), is one component of a comprehensive surveillance program.  Test performance has been validated by [facility] for patients greater than or equal to 2 year old. It is not intended to diagnose infection nor to guide or monitor treatment.     Medical History: Past Medical History  Diagnosis Date  . ALLERGIC RHINITIS 01/23/2007  . BENIGN PROSTATIC HYPERTROPHY 09/10/2006  . FATIGUE 01/08/2008  . GERD 04/15/2010  . Headache(784.0) 09/10/2006  . HOARSENESS 04/15/2010  . HYPERLIPIDEMIA 09/10/2006  . INSOMNIA-SLEEP DISORDER-UNSPEC  04/24/2009  . PEPTIC ULCER DISEASE 01/23/2007  . Chronic headaches   . COPD, MILD 04/03/2010    patient denies on preop visit of 08/02/2014   . Difficulty in urination   . H. pylori infection     hx of   . Arthritis    Assessment: Patient's an 81 y.o with spinal stenosis s/p spinal decompression and microdisecectomy today.  He received vancomycin 1gm IV x1 pre-op at 1021 this morning.  To continue vancomycin for post-op prophylaxis.  Per RN, patient does not have a surgical drain.  Goal of Therapy:  Vancomycin trough level 15-20 mcg/ml  Plan:  - vancomycin 1gm IV x1 at 2200 tonight - pharmacy will sign off  Thank you for allowing pharmacy to take part in this patient's care.  Rennee Coyne P 08/07/2014,3:48 PM

## 2014-08-07 NOTE — Transfer of Care (Signed)
Immediate Anesthesia Transfer of Care Note  Patient: Raymond Logan  Procedure(s) Performed: Procedure(s): complete DECOMPRESSION LUMBAR LAMINECTOMY/MICRODISCECTOMY OF L4-L5 for spinal stenosis, DECOMPRESSION OF L3-L4 (Right)  Patient Location: PACU  Anesthesia Type:General  Level of Consciousness: oriented, sedated and patient cooperative  Airway & Oxygen Therapy: Patient Spontanous Breathing and Patient connected to face mask oxygen  Post-op Assessment: Report given to RN and Post -op Vital signs reviewed and stable  Post vital signs: Reviewed and stable  Last Vitals:  Filed Vitals:   08/07/14 1400  BP: 123/71  Pulse: 63  Temp: 36.3 C  Resp: 8    Complications: No apparent anesthesia complications

## 2014-08-07 NOTE — Progress Notes (Signed)
Pt unable to void, many attempts made, pt bladder scanned 947 in bladder, I/O cath was unsucessfull. MD order coude catheter.

## 2014-08-07 NOTE — Consult Note (Signed)
Urology Consult  Consulting MD: Gladstone Lighter  CC: Urinary retention  HPI: This is a 81 year old male with a history of BPH, followed by Dr. Eda Keys at [facility]. The patient currently is on finasteride for BPH. The patient had back surgery earlier today. Catheter was not placed prior to the procedure. The patient was unable to void postoperatively. He has had 3 attempts at catheterization-seemingly twice with smaller catheters, once with an 46 French coud-tip catheter. Urologic consultation is requested.  The patient does have mild-to-moderate voiding symptoms at baseline. He underwent cystoscopy within our office within the past 2-3 years by Dr. Junious Silk, no urethral lesions were noted. He did have obstructing prostatic tissue, however. The patient was voiding fairly well, apparently to completion until the time of the surgery. Attempts at catheterization were painful to the patient.  PMH: Past Medical History  Diagnosis Date  . ALLERGIC RHINITIS 01/23/2007  . BENIGN PROSTATIC HYPERTROPHY 09/10/2006  . FATIGUE 01/08/2008  . GERD 04/15/2010  . Headache(784.0) 09/10/2006  . HOARSENESS 04/15/2010  . HYPERLIPIDEMIA 09/10/2006  . INSOMNIA-SLEEP DISORDER-UNSPEC 04/24/2009  . PEPTIC ULCER DISEASE 01/23/2007  . Chronic headaches   . COPD, MILD 04/03/2010    patient denies on preop visit of 08/02/2014   . Difficulty in urination   . H. pylori infection     hx of   . Arthritis     PSH: Past Surgical History  Procedure Laterality Date  . Cataract extraction    . Shoulder arthroscopy      x3, L & R  . Knee arthroscopy    . Hemorrhoid banding      Allergies: Allergies  Allergen Reactions  . Penicillins Hives       . Alfuzosin Other (See Comments)    BP went crazy, dizzy, passing out   . Aricept [Donepezil Hcl]     Insomnia, headache    Medications: Prescriptions prior to admission  Medication Sig Dispense Refill Last Dose  . Cholecalciferol (VITAMIN D3) 2000  UNITS TABS Take 1 tablet by mouth daily.   3 wk  . Coenzyme Q10 (COQ-10) 100 MG CAPS Take 1 capsule by mouth daily.   3 wk  . Cranberry (SM CRANBERRY) 300 MG tablet Take 300 mg by mouth daily.    3 wk  . Cyanocobalamin (B-12) 2500 MCG TABS Take 1 tablet by mouth daily.   3 wk  . EVENING PRIMROSE OIL PO Take 1 tablet by mouth daily.   3 wk  . finasteride (PROSCAR) 5 MG tablet Take 5 mg by mouth daily.   08/06/2014 at pm  . gabapentin (NEURONTIN) 100 MG capsule Take 1 capsule by mouth 2 (two) times daily as needed (headache).   0 08/06/2014 at pm  . gabapentin (NEURONTIN) 600 MG tablet Take 300-600 mg by mouth daily. Half tablet for the first week. Then increase to whole tab  0   . Glucosamine-Chondroitin (MOVE FREE PO) Take 200 mg by mouth daily.   3 wk  . magnesium oxide (MAG-OX) 400 MG tablet Take 400 mg by mouth daily.   3 wk  . Multiple Vitamins-Minerals (MENS MULTIVITAMIN PLUS PO) Take 1 tablet by mouth daily.   3 wk  . NON FORMULARY Take 1 tablet by mouth daily. Focus factor   3 wk  . NON FORMULARY Take 1 tablet by mouth daily. Memory complex   3 wk  . NON FORMULARY Take 1 tablet by mouth daily. blueberry   3 wk  . Omega-3 Fatty  Acids (FISH OIL) 1200 MG CAPS Take 1 capsule by mouth daily.   3 wk  . oxyCODONE-acetaminophen (PERCOCET) 7.5-325 MG per tablet Take 1 tablet by mouth every 8 (eight) hours as needed.  0 07/29/2014  . Resveratrol 250 MG CAPS Take 1 capsule by mouth daily.   3 wk  . Turmeric 500 MG CAPS Take 2 tablets by mouth daily.   3 wk  . zolpidem (AMBIEN) 10 MG tablet Take 1 tablet (10 mg total) by mouth at bedtime as needed for sleep. 30 tablet 5   . alfuzosin (UROXATRAL) 10 MG 24 hr tablet Take 10 mg by mouth daily with breakfast. Patient stopped on 07/28/2014 due to hypotension   discont  . Aspirin-Acetaminophen-Caffeine (EXCEDRIN EXTRA STRENGTH PO) Take 1 tablet by mouth as needed (headache).    More than a month at Unknown time  . donepezil (ARICEPT) 10 MG tablet Take 1 tablet  (10 mg total) by mouth at bedtime. To fill mar1, 2016 (Patient taking differently: Take 10 mg by mouth at bedtime. To fill mar1, 2016 Patient stopped on approximately  07/14/2014 due to headaches and made memory worse per patient) 90 tablet 3      Social History: History   Social History  . Marital Status: Married    Spouse Name: N/A  . Number of Children: 3  . Years of Education: N/A   Occupational History  . Retired    Social History Main Topics  . Smoking status: Former Research scientist (life sciences)  . Smokeless tobacco: Never Used  . Alcohol Use: Yes     Comment: rare  . Drug Use: No  . Sexual Activity: Not on file   Other Topics Concern  . Not on file   Social History Narrative    Family History: Family History  Problem Relation Age of Onset  . Prostate cancer Brother     Review of Systems: Positive: Slow stream, frequency and urgency, intermittency. Currently has retention and bladder pain Negative: .  A further 10 point review of systems was negative except what is listed in the HPI.  Physical Exam: '@VITALS2'$ @ General: No acute distress mild distress from retention.  Awake. Answers questions appropriately Head:  Normocephalic.  Atraumatic. ENT:  EOMI.  Mucous membranes moist Neck:  Supple.  No lymphadenopathy. Pulmonary: Equal effort bilaterally. Abdomen: Soft.  Non- tender to palpation. Skin:  Normal turgor.  No visible rash. Extremity: No gross deformity of bilateral upper extremities.  No gross deformity of bilateral lower extremities. Neurologic: Alert. Appropriate mood.  Penis:  Uncircumcised.  No lesions. Urethra: No Foley catheter in place.  Orthotopic meatus. Blood present at meatus Scrotum: No lesions.  No ecchymosis.  No erythema. Testicles: Descended bilaterally.  No masses bilaterally. Epididymis: Palpable bilaterally.  Non Tender to palpation.  Studies:  No results for input(s): HGB, WBC, PLT in the last 72 hours.  No results for input(s): NA, K, CL, CO2,  BUN, CREATININE, CALCIUM, GFRNONAA, GFRAA in the last 72 hours.  Invalid input(s): MAGNESIUM   No results for input(s): INR, APTT in the last 72 hours.  Invalid input(s): PT   Invalid input(s): ABG After sterile prep and drape and using 30 mL of 2% viscous lidocaine, I attempted to place an 69 French coud-tip catheter. This was unsuccessful. Following this, I used a flexible cystoscope with water's irrigation to traverse the urethra. There was trauma in the posterior urethra with some bleeding. Identification of any specific lumen at this point was difficult. However, I was able to  negotiate the catheter into the bladder. Once the bladder was reached, I passed a 0.038 inch sensor-tip guidewire through the cystoscope. The scope was then removed, leaving the guidewire in place. I then passed a 15 [place] council tip catheter which was negotiated easily into the bladder over the guidewire. The guidewire was removed. The   balloon was filled with 10 mL of water and hooked to dependent drainage. Urine was slightly bloody but drained quite well.  The patient tolerated procedure well.  Assessment:  Urinary retention, postop-now with catheter in  Urethral trauma secondary to catheterization attempts  Plan:  I would recommend leaving the catheter in tonight, and Thursday night as well. I think it worthwhile to have the catheter removed on Friday morning early (0500 ) if discharge is planned at that time. I would recommend having him follow-up in her office within the next month or so.  If further questions, please don't hesitate to get back to [place].    Pager:[phone]

## 2014-08-08 ENCOUNTER — Encounter (HOSPITAL_COMMUNITY): Payer: Self-pay | Admitting: Orthopedic Surgery

## 2014-08-08 DIAGNOSIS — K219 Gastro-esophageal reflux disease without esophagitis: Secondary | ICD-10-CM | POA: Diagnosis not present

## 2014-08-08 DIAGNOSIS — N400 Enlarged prostate without lower urinary tract symptoms: Secondary | ICD-10-CM | POA: Diagnosis not present

## 2014-08-08 DIAGNOSIS — J449 Chronic obstructive pulmonary disease, unspecified: Secondary | ICD-10-CM | POA: Diagnosis not present

## 2014-08-08 DIAGNOSIS — E785 Hyperlipidemia, unspecified: Secondary | ICD-10-CM | POA: Diagnosis not present

## 2014-08-08 DIAGNOSIS — M4806 Spinal stenosis, lumbar region: Secondary | ICD-10-CM | POA: Diagnosis not present

## 2014-08-08 DIAGNOSIS — Z8711 Personal history of peptic ulcer disease: Secondary | ICD-10-CM | POA: Diagnosis not present

## 2014-08-08 MED ORDER — SUMATRIPTAN SUCCINATE 25 MG PO TABS
25.0000 mg | ORAL_TABLET | ORAL | Status: DC | PRN
  Filled 2014-08-08: qty 1

## 2014-08-08 NOTE — Progress Notes (Signed)
Subjective: 1 Day Post-Op Procedure(s) (LRB): complete DECOMPRESSION LUMBAR LAMINECTOMY/MICRODISCECTOMY OF L4-L5 for spinal stenosis, DECOMPRESSION OF L3-L4 (Right) Patient reports pain as 2 on 0-10 scale.He had to be catherized several times last night. Urology inserted a Foley. Will DC tomorrow.No further leg pain but he is extremely uncomfortable.    Objective: Vital signs in last 24 hours: Temp:  [97.3 F (36.3 C)-99.3 F (37.4 C)] 99.3 F (37.4 C) (06/09 0530) Pulse Rate:  [47-67] 66 (06/09 0530) Resp:  [7-16] 16 (06/09 0530) BP: (100-152)/(40-73) 100/40 mmHg (06/09 0530) SpO2:  [96 %-100 %] 96 % (06/09 0530) Weight:  [73.029 kg (161 lb)] 73.029 kg (161 lb) (06/08 0939)  Intake/Output from previous day: 06/08 0701 - 06/09 0700 In: 3691.7 [P.O.:600; I.V.:3091.7] Out: 1350 [Urine:1350] Intake/Output this shift:    No results for input(s): HGB in the last 72 hours. No results for input(s): WBC, RBC, HCT, PLT in the last 72 hours. No results for input(s): NA, K, CL, CO2, BUN, CREATININE, GLUCOSE, CALCIUM in the last 72 hours. No results for input(s): LABPT, INR in the last 72 hours.  Dorsiflexion/Plantar flexion intact  Assessment/Plan: 1 Day Post-Op Procedure(s) (LRB): complete DECOMPRESSION LUMBAR LAMINECTOMY/MICRODISCECTOMY OF L4-L5 for spinal stenosis, DECOMPRESSION OF L3-L4 (Right) Discharge home with home health  Riyanshi Wahab A 08/08/2014, 7:14 AM

## 2014-08-08 NOTE — Progress Notes (Signed)
PT Cancellation Note  Patient Details Name: Raymond Logan MRN: [id number] DOB: [birth year]   Cancelled Treatment:    Reason Eval/Treat Not Completed: Pain limiting ability to participate , patient relates discomfort with  Catheter and back, getting medicated . Will return in at least 1 hour to mobilize.  Claretha Cooper 08/08/2014, 1:15 PM Tresa Endo PT [phone]

## 2014-08-08 NOTE — Op Note (Signed)
NAMEJOHANNES, EVERAGE NO.:  [id number]  MEDICAL RECORD NO.:  [id number]  LOCATION:  51                         FACILITY:  [facility]  PHYSICIAN:  Kipp Brood. Kesley Gaffey, M.D.DATE OF BIRTH:  [birth year]  DATE OF PROCEDURE:  08/07/2014 DATE OF DISCHARGE:                              OPERATIVE REPORT   SURGEON:  Kipp Brood. Gladstone Lighter, M.D.  ASSISTANT:  Tarri Glenn, M.D.  PREOPERATIVE DIAGNOSES: 1. Spinal stenosis, moderate at L3-L4. 2. Severe spinal stenosis at L4-L5. 3. Herniated disk central into the right at L4-5 on the right. 4. Foraminal stenosis for the L4 and the L5 root bilaterally.  POSTOPERATIVE DIAGNOSES: 1. Spinal stenosis, moderate at L3-L4. 2. Severe spinal stenosis at L4-L5. 3. Herniated disk central into the right at L4-5 on the right. 4. Foraminal stenosis for the L4 and the L5 root bilaterally.  OPERATIONS: 1. Complete central decompressive lumbar laminectomy at L3-4 for     spinal stenosis. 2. Complete central decompressive lumbar laminectomy for L4-5 for     spinal stenosis. 3. Foraminotomies for the L4 and the L5 roots bilaterally at two     levels. 4. Microdiskectomy at L4-5 on the right for herniated lumbar disk.  SPECIMEN:  No specimen was sent.  DESCRIPTION OF PROCEDURE:  Under general anesthesia, routine orthopedic prepping and draping of the lower back was carried out, the appropriate time-out was first carried out.  I also marked the appropriate right side of the back in the holding area because mainly he had symptoms on the right, he had weakness of his right dorsiflexors.  At this time, two needles were placed in the back for localization purposes and x-ray was taken.  Note, the patient had 1 g of vancomycin at this time.  Following that, the incision then was made over the L3-4 and L4-5 area.  Bleeders were identified and cauterized.  I stripped the muscle from the lamina and spinous processes bilaterally.  Another x-ray was  taken.  We first started at L3-4.  We did a central decompression at L3-4 and then went down to L4-5 centrally and then went out and decompressed the both lateral recesses and the microscope was used.  At this time, we protected the dura by leaving the ligamentum flavum in place.  After we decompressed the recesses in the usual fashion, we then protected the dura with cottonoids and as I said, we used the microscope, we then removed the thickened dura.  Initially, it was thought that we had a moderate stenosis at 3-4, but this was much more than moderate, especially on the right.  We put a needle in the disk space, took an x- ray and we verified that we were at the 3-4 space.  We then removed the needle and kept proceeding distally at 4-5 and then put a needle in the 4-5 space, another x-ray was taken.  Note, the nerve root on the right, the L5 root appeared to be much more compressed than the root above on the right.  We went distally, proximally and laterally as far as we had to go, we used a hockey-stick to make sure we had total freedom now both proximally and distally.  We did foraminotomies bilaterally for the L4 and L5 roots, we were able to easily pass the hockey-stick out the foramen.  Note, we then utilized the D'Errico retractor, gently retracted the root and as I mentioned, needle was placed in that area at 4-5.  We then made a cruciate incision in the posterior longitudinal ligament and we did a microdiskectomy in usual fashion.  We thoroughly irrigated out the area.  After we were done, we followed the root out the foramen on that right side and was entirely free.  We thoroughly irrigated the area and loosely applied some thrombin-soaked Gelfoam and closed the wound layers in usual fashion.  I left a small distal and proximal deep part of the wound open for drainage purposes.  Noted, at beginning of the case, I injected 20 mL of 0.25% Marcaine with epinephrine and soft  tissue to control bleeding.  At the end of the case, I injected 20 mL of Exparel for pain relief.  The subcu was closed with #1 Vicryl, skin with metal staples.  Sterile Neosporin dressing was applied.  The patient left the operating room in satisfactory condition.          ______________________________ Kipp Brood Gladstone Lighter, M.D.     RAG/MEDQ  D:  08/07/2014  T:  08/08/2014  Job:  [id number]

## 2014-08-08 NOTE — Evaluation (Signed)
Physical Therapy Evaluation Patient Details Name: Raymond Logan MRN: [id number] DOB: 1930-05-12 Today's Date: 08/08/2014   History of Present Illness  DECOMPRESSION LUMBAR LAMINECTOMY/MICRODISCECTOMY OF L4-L5 for spinal stenosis  Clinical Impression  Patient reports feeling dizzy and light headed. He was unsteady upon standing. Patient was able to ambulate x 260' with Rolling walker. Patient will benefit from PT to address  Problems listed in note below.    Follow Up Recommendations No PT follow up;Supervision/Assistance - 24 hour    Equipment Recommendations  Rolling walker with 5" wheels    Recommendations for Other Services       Precautions / Restrictions Precautions Precautions: Back Precaution Booklet Issued: Yes (comment)      Mobility  Bed Mobility Overal bed mobility: Needs Assistance Bed Mobility: Rolling;Sidelying to Sit Rolling: Supervision   Supine to sit: Min assist     General bed mobility comments: cues for back precautions, use of rail  Transfers Overall transfer level: Needs assistance Equipment used: Rolling walker (2 wheeled) Transfers: Sit to/from Stand Sit to Stand: Min assist         General transfer comment: cues for safety, back precautions.  Ambulation/Gait Ambulation/Gait assistance: Min assist;+2 safety/equipment Ambulation Distance (Feet): 260 Feet Assistive device: Rolling walker (2 wheeled) Gait Pattern/deviations: Step-through pattern;Shuffle;Staggering right;Staggering left Gait velocity: slow   General Gait Details: cues for safety and  position and posture.  Stairs            Wheelchair Mobility    Modified Rankin (Stroke Patients Only)       Balance Overall balance assessment: Needs assistance         Standing balance support: During functional activity;Bilateral upper extremity supported Standing balance-Leahy Scale: Poor Standing balance comment: tending to  lose balance backward when first standing  up.                             Pertinent Vitals/Pain Pain Score: 7  Pain Location: back Pain Descriptors / Indicators: Sore;Tightness Pain Intervention(s): Premedicated before session;Repositioned;Monitored during session    [place] expects to be discharged to:: Private residence Living Arrangements: Spouse/significant other Available Help at Discharge: Family Type of Home: House Home Access: Stairs to enter Entrance Stairs-Rails: Psychiatric nurse of Steps: 7-8 Home Layout: Full bath on main level;Two level Home Equipment: None      Prior Function Level of Independence: Independent               Hand Dominance        Extremity/Trunk Assessment               Lower Extremity Assessment: Generalized weakness         Communication   Communication: No difficulties  Cognition Arousal/Alertness: Awake/alert Behavior During Therapy: WFL for tasks assessed/performed Overall Cognitive Status: Within Functional Limits for tasks assessed                      General Comments      Exercises        Assessment/Plan    PT Assessment Patient needs continued PT services  PT Diagnosis Difficulty walking;Acute pain   PT Problem List Decreased activity tolerance;Decreased balance;Decreased mobility;Pain;Decreased knowledge of precautions  PT Treatment Interventions DME instruction;Gait training;Stair training;Functional mobility training;Therapeutic activities;Patient/family education   PT Goals (Current goals can be found in the Care Plan section) Acute Rehab PT Goals Patient Stated Goal: get back to living  my life PT Goal Formulation: With patient/family Time For Goal Achievement: 08/15/14 Potential to Achieve Goals: Good    Frequency Min 5X/week   Barriers to discharge        Co-evaluation               End of Session   Activity Tolerance: Patient tolerated treatment well Patient left:  in chair;with call bell/phone within reach;with family/visitor present Nurse Communication: Mobility status (blood from catheter.)    Functional Assessment Tool Used: clinical judgement, Functional Limitation: Mobility: Walking and moving around Mobility: Walking and Moving Around Current Status (W8677): At least 20 percent but less than 40 percent impaired, limited or restricted Mobility: Walking and Moving Around Goal Status [phone]): At least 1 percent but less than 20 percent impaired, limited or restricted    Time: 1451-1508 PT Time Calculation (min) (ACUTE ONLY): 17 min   Charges:   PT Evaluation $Initial PT Evaluation Tier I: 1 Procedure     PT G Codes:   PT G-Codes **NOT FOR INPATIENT CLASS** Functional Assessment Tool Used: clinical judgement, Functional Limitation: Mobility: Walking and moving around Mobility: Walking and Moving Around Current Status (K1594): At least 20 percent but less than 40 percent impaired, limited or restricted Mobility: Walking and Moving Around Goal Status [phone]): At least 1 percent but less than 20 percent impaired, limited or restricted    Claretha Cooper 08/08/2014, 4:31 PM Tresa Endo PT [phone]

## 2014-08-08 NOTE — Evaluation (Signed)
Occupational Therapy Evaluation Patient Details Name: Raymond Logan MRN: [id number] DOB: 1930/05/15 Today's Date: 08/08/2014    History of Present Illness DECOMPRESSION LUMBAR LAMINECTOMY/MICRODISCECTOMY OF L4-L5 for spinal stenosis   Clinical Impression   Patient is s/p back surgery resulting in the deficits listed below (see OT Problem List).  Patient will benefit from skilled OT to increase their safety and independence with ADL and functional mobility for ADL (while adhering to their precautions) to facilitate discharge to venue listed below.      Follow Up Recommendations  Home health OT;Supervision/Assistance - 24 hour          Precautions / Restrictions Precautions Precautions: Back      Mobility Bed Mobility Overal bed mobility: Needs Assistance Bed Mobility: Supine to Sit     Supine to sit: Max assist        Transfers                 General transfer comment: did not perform as pt became dizzy         ADL Overall ADL's : Needs assistance/impaired     Grooming: Set up;Bed level                                 General ADL Comments: Upon sitting pt needed to return to supine as pt became dizzy and self he had blurry vision. Pts BP 98/45               Pertinent Vitals/Pain Pain Assessment: 0-10 Pain Score: 6  Pain Location: back Pain Descriptors / Indicators: Sore     Hand Dominance     Extremity/Trunk Assessment Upper Extremity Assessment Upper Extremity Assessment: Generalized weakness           Communication Communication Communication: No difficulties   Cognition Arousal/Alertness: Awake/alert Behavior During Therapy: WFL for tasks assessed/performed Overall Cognitive Status: Within Functional Limits for tasks assessed                                Home Living Family/patient expects to be discharged to:: Private residence Living Arrangements: Spouse/significant other Available Help at  Discharge: Family Type of Home: House Home Access: Stairs to enter Technical brewer of Steps: 7-8   Home Layout: Full bath on main level;Two level     Bathroom Shower/Tub: Tub/shower unit;Walk-in shower   Bathroom Toilet: Standard     Home Equipment: None          Prior Functioning/Environment Level of Independence: Independent             OT Diagnosis: Generalized weakness;Acute pain   OT Problem List: Decreased strength;Decreased activity tolerance;Pain   OT Treatment/Interventions: Self-care/ADL training;DME and/or AE instruction;Patient/family education    OT Goals(Current goals can be found in the care plan section) Acute Rehab OT Goals Patient Stated Goal: get back to living my life OT Goal Formulation: With patient Time For Goal Achievement: 08/22/14 Potential to Achieve Goals: Good ADL Goals Pt Will Perform Grooming: with modified independence;standing Pt Will Perform Lower Body Dressing: with modified independence;sit to/from stand Pt Will Transfer to Toilet: with modified independence;ambulating;regular height toilet Pt Will Perform Toileting - Clothing Manipulation and hygiene: with modified independence;sit to/from stand  OT Frequency: Min 2X/week   Barriers to D/C:            Co-evaluation  End of Session    Activity Tolerance: Treatment limited secondary to medical complications (Comment) Patient left: in bed;with call bell/phone within reach;with family/visitor present   Time: 1517-6160 OT Time Calculation (min): 18 min Charges:  OT General Charges $OT Visit: 1 Procedure OT Evaluation $Initial OT Evaluation Tier I: 1 Procedure G-Codes:    Payton Mccallum D 08-26-2014, 10:34 AM

## 2014-08-09 DIAGNOSIS — J449 Chronic obstructive pulmonary disease, unspecified: Secondary | ICD-10-CM | POA: Diagnosis not present

## 2014-08-09 DIAGNOSIS — M4806 Spinal stenosis, lumbar region: Secondary | ICD-10-CM | POA: Diagnosis not present

## 2014-08-09 DIAGNOSIS — N400 Enlarged prostate without lower urinary tract symptoms: Secondary | ICD-10-CM | POA: Diagnosis not present

## 2014-08-09 DIAGNOSIS — R339 Retention of urine, unspecified: Secondary | ICD-10-CM | POA: Diagnosis not present

## 2014-08-09 DIAGNOSIS — T8389XA Other specified complication of genitourinary prosthetic devices, implants and grafts, initial encounter: Secondary | ICD-10-CM | POA: Diagnosis not present

## 2014-08-09 DIAGNOSIS — E785 Hyperlipidemia, unspecified: Secondary | ICD-10-CM | POA: Diagnosis not present

## 2014-08-09 DIAGNOSIS — R3989 Other symptoms and signs involving the genitourinary system: Secondary | ICD-10-CM | POA: Diagnosis not present

## 2014-08-09 DIAGNOSIS — Z8711 Personal history of peptic ulcer disease: Secondary | ICD-10-CM | POA: Diagnosis not present

## 2014-08-09 DIAGNOSIS — K219 Gastro-esophageal reflux disease without esophagitis: Secondary | ICD-10-CM | POA: Diagnosis not present

## 2014-08-09 MED ORDER — BISACODYL 10 MG RE SUPP
10.0000 mg | Freq: Every day | RECTAL | Status: DC | PRN
  Administered 2014-08-09: 10 mg via RECTAL
  Filled 2014-08-09: qty 1

## 2014-08-09 MED ORDER — OXYCODONE-ACETAMINOPHEN 5-325 MG PO TABS: 1.0000 | ORAL_TABLET | ORAL | Status: DC | PRN

## 2014-08-09 MED ORDER — BISACODYL 5 MG PO TBEC: 5.0000 mg | DELAYED_RELEASE_TABLET | Freq: Every day | ORAL | Status: DC | PRN

## 2014-08-09 MED ORDER — POLYETHYLENE GLYCOL 3350 17 G PO PACK: 17.0000 g | PACK | Freq: Every day | ORAL | Status: DC | PRN

## 2014-08-09 MED ORDER — NITROFURANTOIN MONOHYD MACRO 100 MG PO CAPS: 100.0000 mg | ORAL_CAPSULE | Freq: Every day | ORAL | Status: DC

## 2014-08-09 MED ORDER — METHOCARBAMOL 500 MG PO TABS: 500.0000 mg | ORAL_TABLET | Freq: Four times a day (QID) | ORAL | Status: DC | PRN

## 2014-08-09 NOTE — Discharge Instructions (Addendum)
Change your dressing daily. Shower only, no tub bath. For the first three days, remove dressing and place plastic wrap over incision while your shower.  After showering, remove plastic covering and place a new dressing over the incision.  Take aspirin '325mg'$  twice daily to prevent blood clots.  Make appointment with urology regarding foley catheter.  Call if any temperatures greater than 101 or any wound complications: [phone] during the day and ask for Dr. Charlestine Night nurse, Brunilda Payor  Foley [place] catheter is a soft, flexible tube that is placed into the bladder to drain urine. A Foley catheter may be inserted if:  You leak urine or are not able to control when you urinate (urinary incontinence).  You are not able to urinate when you need to (urinary retention).  You had prostate surgery or surgery on the genitals.  You have certain medical conditions, such as multiple sclerosis, dementia, or a spinal cord injury. If you are going home with a Foley catheter in place, follow the instructions below. TAKING CARE OF THE CATHETER 1. Wash your hands with soap and water. 2. Using mild soap and warm water on a clean washcloth:  Clean the area on your body closest to the catheter insertion site using a circular motion, moving away from the catheter. Never wipe toward the catheter because this could sweep bacteria up into the urethra and cause infection.  Remove all traces of soap. Pat the area dry with a clean towel. For males, reposition the foreskin. 3. Attach the catheter to your leg so there is no tension on the catheter. Use adhesive tape or a leg strap. If you are using adhesive tape, remove any sticky residue left behind by the previous tape you used. 4. Keep the drainage bag below the level of the bladder, but keep it off the floor. 5. Check throughout the day to be sure the catheter is working and urine is draining freely. Make sure the tubing does not become  kinked. 6. Do not pull on the catheter or try to remove it. Pulling could damage internal tissues. TAKING CARE OF THE DRAINAGE BAGS You will be given two drainage bags to take home. One is a large overnight drainage bag, and the other is a smaller leg bag that fits underneath clothing. You may wear the overnight bag at any time, but you should never wear the smaller leg bag at night. Follow the instructions below for how to empty, change, and clean your drainage bags. Emptying the Drainage Bag You must empty your drainage bag when it is  - full or at least 2-3 times a day. 1. Wash your hands with soap and water. 2. Keep the drainage bag below your hips, below the level of your bladder. This stops urine from going back into the tubing and into your bladder. 3. Hold the dirty bag over the toilet or a clean container. 4. Open the pour spout at the bottom of the bag and empty the urine into the toilet or container. Do not let the pour spout touch the toilet, container, or any other surface. Doing so can place bacteria on the bag, which can cause an infection. 5. Clean the pour spout with a gauze pad or cotton ball that has rubbing alcohol on it. 6. Close the pour spout. 7. Attach the bag to your leg with adhesive tape or a leg strap. 8. Wash your hands well. Changing the Drainage Bag Change your drainage bag once a month or  sooner if it starts to smell bad or look dirty. Below are steps to follow when changing the drainage bag. 1. Wash your hands with soap and water. 2. Pinch off the rubber catheter so that urine does not spill out. 3. Disconnect the catheter tube from the drainage tube at the connection valve. Do not let the tubes touch any surface. 4. Clean the end of the catheter tube with an alcohol wipe. Use a different alcohol wipe to clean the end of the drainage tube. 5. Connect the catheter tube to the drainage tube of the clean drainage bag. 6. Attach the new bag to the leg with adhesive  tape or a leg strap. Avoid attaching the new bag too tightly. 7. Wash your hands well. Cleaning the Drainage Bag 1. Wash your hands with soap and water. 2. Wash the bag in warm, soapy water. 3. Rinse the bag thoroughly with warm water. 4. Fill the bag with a solution of white vinegar and water (1 cup vinegar to 1 qt warm water [.2 L vinegar to 1 L warm water]). Close the bag and soak it for 30 minutes in the solution. 5. Rinse the bag with warm water. 6. Hang the bag to dry with the pour spout open and hanging downward. 7. Store the clean bag (once it is dry) in a clean plastic bag. 8. Wash your hands well. PREVENTING INFECTION  Wash your hands before and after handling your catheter.  Take showers daily and wash the area where the catheter enters your body. Do not take baths. Replace wet leg straps with dry ones, if this applies.  Do not use powders, sprays, or lotions on the genital area. Only use creams, lotions, or ointments as directed by your caregiver.  For females, wipe from front to back after each bowel movement.  Drink enough fluids to keep your urine clear or pale yellow unless you have a fluid restriction.  Do not let the drainage bag or tubing touch or lie on the floor.  Wear cotton underwear to absorb moisture and to keep your skin drier. SEEK MEDICAL CARE IF:   Your urine is cloudy or smells unusually bad.  Your catheter becomes clogged.  You are not draining urine into the bag or your bladder feels full.  Your catheter starts to leak. SEEK IMMEDIATE MEDICAL CARE IF:   You have pain, swelling, redness, or pus where the catheter enters the body.  You have pain in the abdomen, legs, lower back, or bladder.  You have a fever.  You see blood fill the catheter, or your urine is pink or red.  You have nausea, vomiting, or chills.  Your catheter gets pulled out. MAKE SURE YOU:   Understand these instructions.  Will watch your condition.  Will get help  right away if you are not doing well or get worse. Document Released: 02/15/2005 Document Revised: 07/02/2013 Document Reviewed: 02/07/2012 [facility] Patient Information 2015 [place], [place]. This information is not intended to replace advice given to you by your health care provider. Make sure you discuss any questions you have with your health care provider.

## 2014-08-09 NOTE — Progress Notes (Signed)
Patient catheter bleeding at this time. Urine pink in color. Urology, [place] (on call) paged at [phone] to inform of this ongoing issue. Family concerned about discharge at this time, will discuss with urology as well as attending MD.   MD returned page at [phone], and stated he would come see the patient this afternoon.   MD rounded at 1515, and recommended that the patient go home on an antibiotic. He also wanted me to call Gioffre, MD and ask if the patients aspirin 325 BID could be decreased to an '81mg'$  aspirin once daily.

## 2014-08-09 NOTE — Progress Notes (Signed)
Occupational Therapy Treatment Patient Details Name: ARTUR WINNINGHAM MRN: [id number] DOB: [birth year] Today's Date: 08/09/2014    History of present illness DECOMPRESSION LUMBAR LAMINECTOMY/MICRODISCECTOMY OF L4-L5 for spinal stenosis   OT comments  Pt needs cues for back precautions during ADLs and transitional movements but verbalizes that he will allow family members to assist him at home.  Needed more help with rolling than he did at last tx.  Pt is not interested in AE at this time, but he is agreeable to 3:1 commode  Follow Up Recommendations  Supervision/Assistance - 24 hour    Equipment Recommendations  3 in 1 bedside comode    Recommendations for Other Services      Precautions / Restrictions Precautions Precautions: Back Restrictions Weight Bearing Restrictions: No       Mobility Bed Mobility     Rolling: Min assist   Supine to sit: Min assist     General bed mobility comments: used chux pad to assist with rolling  Transfers   Equipment used: Rolling walker (2 wheeled) Transfers: Sit to/from Stand Sit to Stand: Min guard         General transfer comment: cues for back precautions    Balance                                   ADL Overall ADL's : Needs assistance/impaired                         Toilet Transfer: Min guard;Ambulation;BSC;RW   Toileting- Water quality scientist and Hygiene: Min guard;Sit to/from stand         General ADL Comments: ambulated to bathroom with min guard, cues to step up into walker.  Pt performed UB adl and peri area from commode--did not want to perform LB at this time.  Educated on AE but pt will have family assist him at home.  Verbally reviewed shower transfer; did not practice this session.  Daughter present, who is a PT.  Pt had blood from penis in toilet--called RN into room      Vision                     Perception     Praxis      Cognition   Behavior During Therapy:  [facility] for tasks assessed/performed Overall Cognitive Status: Within Functional Limits for tasks assessed                       Extremity/Trunk Assessment               Exercises     Shoulder Instructions       General Comments      Pertinent Vitals/ Pain       Pain Assessment: 0-10 Pain Score: 6  (4 at end of session) Pain Location: back and headache Pain Descriptors / Indicators: Aching Pain Intervention(s): Limited activity within patient's tolerance;Monitored during session;Premedicated before session;Repositioned  Home Living                                          Prior Functioning/Environment              Frequency Min 2X/week     Progress Toward Goals  OT Goals(current goals can  now be found in the care plan section)  Progress towards OT goals: Progressing toward goals  Acute Rehab OT Goals Patient Stated Goal: get back to living my life  Plan Discharge plan needs to be updated    Co-evaluation                 End of Session     Activity Tolerance No increased pain;Patient limited by fatigue   Patient Left in bed;with call bell/phone within reach;with family/visitor present   Nurse Communication  (need for 3:1, check bleeding)        Time: 1937-9024 OT Time Calculation (min): 31 min  Charges: OT General Charges $OT Visit: 1 Procedure OT Treatments $Self Care/Home Management : 23-37 mins  Aldous Housel 08/09/2014, 10:22 AM Lesle Chris, OTR/L [phone] 08/09/2014

## 2014-08-09 NOTE — Progress Notes (Signed)
Subjective: 2 Days Post-Op Procedure(s) (LRB): complete DECOMPRESSION LUMBAR LAMINECTOMY/MICRODISCECTOMY OF L4-L5 for spinal stenosis, DECOMPRESSION OF L3-L4 (Right) Patient reports pain as 2 on 0-10 scale. Will DC with Foley since it was so very difficult to insert. He was instructed to see Urology on Monday.No further leg Pain.   Objective: Vital signs in last 24 hours: Temp:  [97.7 F (36.5 C)-100.2 F (37.9 C)] 99.3 F (37.4 C) (06/10 0559) Pulse Rate:  [64-68] 64 (06/10 0559) Resp:  [16-18] 16 (06/10 0559) BP: (98-117)/(41-77) 117/43 mmHg (06/10 0559) SpO2:  [90 %-100 %] 92 % (06/10 0559)  Intake/Output from previous day: 06/09 0701 - 06/10 0700 In: 3445 [P.O.:660; I.V.:2785] Out: 2125 [Urine:2125] Intake/Output this shift:    No results for input(s): HGB in the last 72 hours. No results for input(s): WBC, RBC, HCT, PLT in the last 72 hours. No results for input(s): NA, K, CL, CO2, BUN, CREATININE, GLUCOSE, CALCIUM in the last 72 hours. No results for input(s): LABPT, INR in the last 72 hours.  Dorsiflexion/Plantar flexion intact  Assessment/Plan: 2 Days Post-Op Procedure(s) (LRB): complete DECOMPRESSION LUMBAR LAMINECTOMY/MICRODISCECTOMY OF L4-L5 for spinal stenosis, DECOMPRESSION OF L3-L4 (Right) Discharge home with home health See the written copy of this report in the patient's paper medical record.  These results did not interface directly into the electronic medical record and are summarized here. Urology on Monday.  Irl Bodie A 08/09/2014, 7:19 AM

## 2014-08-09 NOTE — Progress Notes (Signed)
Physical Therapy Treatment Patient Details Name: Raymond Logan MRN: [id number] DOB: 09-18-1930 Today's Date: 08/09/2014    History of Present Illness DECOMPRESSION LUMBAR LAMINECTOMY/MICRODISCECTOMY OF L4-L5 for spinal stenosis    PT Comments    Daughter present during session for hands on training and instruction.  Assisted pt OOB using "log roll" tech.  Assisted with amb in hallway then practiced going up and down 4 steps using one rail.  Instructed on back precautions and handout give.  Instructed on use of ICE.    Follow Up Recommendations  No PT follow up;Supervision/Assistance - 24 hour     Equipment Recommendations  Rolling walker with 5" wheels    Recommendations for Other Services       Precautions / Restrictions Precautions Precautions: Back Precaution Booklet Issued: Yes (comment) Precaution Comments: BAT handout given Restrictions Weight Bearing Restrictions: No    Mobility  Bed Mobility Overal bed mobility: Modified Independent Bed Mobility: Rolling;Sidelying to Sit Rolling: Min assist Sidelying to sit: Supervision Supine to sit: Supervision     General bed mobility comments: increased time and 25% VC's on proper "log roll" tech  Transfers Overall transfer level: Needs assistance Equipment used: Rolling walker (2 wheeled) Transfers: Sit to/from Stand Sit to Stand: Supervision         General transfer comment: good tech and one VC to reach back prior to sit to control decend  Ambulation/Gait Ambulation/Gait assistance: Supervision;Min guard Ambulation Distance (Feet): 315 Feet Assistive device: Rolling walker (2 wheeled) Gait Pattern/deviations: Step-through pattern;Decreased stride length Gait velocity: decreased   General Gait Details: increased time.  Good alternating gait with equal weight shift.    Stairs Stairs: Yes Stairs assistance: Min assist Stair Management: One rail Left;Forwards;Step to pattern Number of Stairs: 4 General  stair comments: with daughter assisting and one initial VC on proper sequnecing and safe handling   Wheelchair Mobility    Modified Rankin (Stroke Patients Only)       Balance                                    Cognition Arousal/Alertness: Awake/alert Behavior During Therapy: WFL for tasks assessed/performed Overall Cognitive Status: Within Functional Limits for tasks assessed                      Exercises      General Comments        Pertinent Vitals/Pain Pain Assessment: 0-10 Pain Score: 6  Pain Location: low back Pain Descriptors / Indicators: Constant Pain Intervention(s): Monitored during session;Premedicated before session;Repositioned;Ice applied    Home Living                      Prior Function            PT Goals (current goals can now be found in the care plan section) Acute Rehab PT Goals Patient Stated Goal: get back to living my life Progress towards PT goals: Progressing toward goals    Frequency  Min 5X/week    PT Plan      Co-evaluation             End of Session Equipment Utilized During Treatment: Gait belt Activity Tolerance: Patient tolerated treatment well Patient left: in chair;with call bell/phone within reach;with family/visitor present     Time: 5852-7782 PT Time Calculation (min) (ACUTE ONLY): 28 min  Charges:  $Gait Training:  8-22 mins $Therapeutic Activity: 8-22 mins                    G Codes:      Rica Koyanagi  PTA [facility]  Acute  Rehab Pager      [phone]

## 2014-08-09 NOTE — Progress Notes (Signed)
Dr. Gladstone Lighter returned page at 1525, and stated that the patient could go home on an '81mg'$  aspirin once daily until follow up at the clinic.

## 2014-08-09 NOTE — Progress Notes (Addendum)
  Pt had a BM earlier and developed some bleeding around the catheter. It has improved.   He is POD#2 spinal stenosis surgery.  PE: Urine clear Dried blood around catheter at meatus.    A _ BPH, difficult foley, gross hematuria -  P_discussed with patient and daughter nature, r/b of void trial vs. Leaving foley. They want to leave foley over weekend. Will send on one nitrofurantoin per day. Void trial in office next week. Pt voiding much better on alfuzosin but syncope. Will try Rapaflo or tamsulosin next week. Discussed nature, r/b of combination tx (5ARI + alpha blocker).   Nurse will call and check with Dr. Gladstone Lighter if ASA can be low dose.

## 2014-08-09 NOTE — Progress Notes (Signed)
RN reviewed discharge instructions with patient and daughter. Patient instructed about catheter care, change to aspirin regimen, dressing changes, as well as positioning. All questions answered.   Equipment (3in1, [place]) delivered to room.   Paperwork and prescriptions given. Dressing change supplies given, as well as a leg bag for catheter.   NT rolled patient down in wheelchair to family car.

## 2014-08-12 NOTE — Discharge Summary (Signed)
Physician Discharge Summary   Patient ID: Raymond Logan MRN: [id number] DOB/AGE: November 04, 1930 81 y.o.  Admit date: 08/07/2014 Discharge date: 08/09/2014  Primary Diagnosis: Lumbar spinal stenosis   Admission Diagnoses:  Past Medical History  Diagnosis Date  . ALLERGIC RHINITIS 01/23/2007  . BENIGN PROSTATIC HYPERTROPHY 09/10/2006  . FATIGUE 01/08/2008  . GERD 04/15/2010  . Headache(784.0) 09/10/2006  . HOARSENESS 04/15/2010  . HYPERLIPIDEMIA 09/10/2006  . INSOMNIA-SLEEP DISORDER-UNSPEC 04/24/2009  . PEPTIC ULCER DISEASE 01/23/2007  . Chronic headaches   . COPD, MILD 04/03/2010    patient denies on preop visit of 08/02/2014   . Difficulty in urination   . H. pylori infection     hx of   . Arthritis    Discharge Diagnoses:   Active Problems:   Spinal stenosis, lumbar region, with neurogenic claudication  Estimated body mass index is 23.76 kg/(m^2) as calculated from the following:   Height as of this encounter: $RemoveBeforeD'5\' 9"'[id number]$  (1.753 m).   Weight as of this encounter: 73.029 kg (161 lb).  Procedure:  Procedure(s) (LRB): complete DECOMPRESSION LUMBAR LAMINECTOMY/MICRODISCECTOMY OF L4-L5 for spinal stenosis, DECOMPRESSION OF L3-L4 (Right)   Consults: urology  HPI: Patient presented with the chief complaint of low back pain. He started having severe pain on the right side going down in the right buttock and right thigh. MRI showed a disc herniation at L4-5 but CT myelogram showed a much more significant stenotic area at L4-L5.   Laboratory Data: Hospital Outpatient Visit on 08/02/2014  Component Date Value Ref Range Status  . WBC 08/02/2014 5.9  4.0 - 10.5 K/uL Final  . RBC 08/02/2014 4.10* 4.22 - 5.81 MIL/uL Final  . Hemoglobin 08/02/2014 12.7* 13.0 - 17.0 g/dL Final  . HCT 08/02/2014 37.1* 39.0 - 52.0 % Final  . MCV 08/02/2014 90.5  78.0 - 100.0 fL Final  . MCH 08/02/2014 31.0  26.0 - 34.0 pg Final  . MCHC 08/02/2014 34.2  30.0 - 36.0 g/dL Final  . RDW 08/02/2014 12.3  11.5 - 15.5 %  Final  . Platelets 08/02/2014 237  150 - 400 K/uL Final  . Neutrophils Relative % 08/02/2014 59  43 - 77 % Final  . Neutro Abs 08/02/2014 3.5  1.7 - 7.7 K/uL Final  . Lymphocytes Relative 08/02/2014 31  12 - 46 % Final  . Lymphs Abs 08/02/2014 1.8  0.7 - 4.0 K/uL Final  . Monocytes Relative 08/02/2014 8  3 - 12 % Final  . Monocytes Absolute 08/02/2014 0.5  0.1 - 1.0 K/uL Final  . Eosinophils Relative 08/02/2014 1  0 - 5 % Final  . Eosinophils Absolute 08/02/2014 0.1  0.0 - 0.7 K/uL Final  . Basophils Relative 08/02/2014 1  0 - 1 % Final  . Basophils Absolute 08/02/2014 0.0  0.0 - 0.1 K/uL Final  . Sodium 08/02/2014 141  135 - 145 mmol/L Final  . Potassium 08/02/2014 3.8  3.5 - 5.1 mmol/L Final  . Chloride 08/02/2014 108  101 - 111 mmol/L Final  . CO2 08/02/2014 26  22 - 32 mmol/L Final  . Glucose, Bld 08/02/2014 112* 65 - 99 mg/dL Final  . BUN 08/02/2014 18  6 - 20 mg/dL Final  . Creatinine, Ser 08/02/2014 0.86  0.61 - 1.24 mg/dL Final  . Calcium 08/02/2014 9.3  8.9 - 10.3 mg/dL Final  . Total Protein 08/02/2014 6.6  6.5 - 8.1 g/dL Final  . Albumin 08/02/2014 3.8  3.5 - 5.0 g/dL Final  . AST 08/02/2014 21  15 - 41 U/L Final  . ALT 08/02/2014 15* 17 - 63 U/L Final  . Alkaline Phosphatase 08/02/2014 71  38 - 126 U/L Final  . Total Bilirubin 08/02/2014 0.5  0.3 - 1.2 mg/dL Final  . GFR calc non Af Amer 08/02/2014 >60  >60 mL/min Final  . GFR calc Af Amer 08/02/2014 >60  >60 mL/min Final   Comment: (NOTE) The eGFR has been calculated using the CKD EPI equation. This calculation has not been validated in all clinical situations. eGFR's persistently <60 mL/min signify possible Chronic Kidney Disease.   . Anion gap 08/02/2014 7  5 - 15 Final  . Prothrombin Time 08/02/2014 13.6  11.6 - 15.2 seconds Final  . INR 08/02/2014 1.02  0.00 - 1.49 Final  . ABO/RH(D) 08/02/2014 A POS   Final  . Antibody Screen 08/02/2014 NEG   Final  . Sample Expiration 08/02/2014 08/10/2014   Final  . Color,  Urine 08/02/2014 YELLOW  YELLOW Final  . APPearance 08/02/2014 CLEAR  CLEAR Final  . Specific Gravity, Urine 08/02/2014 1.014  1.005 - 1.030 Final  . pH 08/02/2014 6.0  5.0 - 8.0 Final  . Glucose, UA 08/02/2014 NEGATIVE  NEGATIVE mg/dL Final  . Hgb urine dipstick 08/02/2014 NEGATIVE  NEGATIVE Final  . Bilirubin Urine 08/02/2014 NEGATIVE  NEGATIVE Final  . Ketones, ur 08/02/2014 NEGATIVE  NEGATIVE mg/dL Final  . Protein, ur 08/02/2014 NEGATIVE  NEGATIVE mg/dL Final  . Urobilinogen, UA 08/02/2014 0.2  0.0 - 1.0 mg/dL Final  . Nitrite 08/02/2014 NEGATIVE  NEGATIVE Final  . Leukocytes, UA 08/02/2014 SMALL* NEGATIVE Final  . MRSA, PCR 08/02/2014 NEGATIVE  NEGATIVE Final  . Staphylococcus aureus 08/02/2014 NEGATIVE  NEGATIVE Final   Comment:        The Xpert SA Assay (FDA approved for NASAL specimens in patients over 65 years of age), is one component of a comprehensive surveillance program.  Test performance has been validated by [facility] for patients greater than or equal to 79 year old. It is not intended to diagnose infection nor to guide or monitor treatment.   . ABO/RH(D) 08/02/2014 A POS   Final  . Squamous Epithelial / LPF 08/02/2014 FEW* RARE Final  . WBC, UA 08/02/2014 21-50  <3 WBC/hpf Final  . RBC / HPF 08/02/2014 0-2  <3 RBC/hpf Final  . Bacteria, UA 08/02/2014 FEW* RARE Final  . Urine-Other 08/02/2014 MUCOUS PRESENT   Final     X-Rays:Dg Chest 2 View  08/02/2014   CLINICAL DATA:  Preoperative exam for lumbar surgery, asymptomatic, nonsmoker.  EXAM: CHEST  2 VIEW  COMPARISON:  PA and lateral chest x-ray of December 01, 2010  FINDINGS: The lungs are well-expanded and clear. The heart and mediastinal contours are normal. There is an azygos lobe anatomy. There is no pleural effusion or pneumothorax. The bony thorax is unremarkable.  IMPRESSION: There is no active cardiopulmonary disease.   Electronically Signed   By: David  Martinique M.D.   On: 08/02/2014 13:10   Dg Lumbar  Spine 2-3 Views  08/02/2014   CLINICAL DATA:  81 year old male with a history of lumbar back pain. Preoperative study.  EXAM: LUMBAR SPINE - 2-3 VIEW  COMPARISON:  Myelogram 07/16/2014, CT 07/16/2014  FINDINGS: Lumbar Spine:  Lumbar vertebral elements maintain normal alignment without evidence of subluxation. Trace retrolisthesis of L3 on L4.  [name]  No fracture line identified. Vertebral body heights maintained as well as disc space heights.  Disc space narrowing is worst at L2-L3,  with more moderate narrowing at L3-L4 and L4-L5.  Facet changes worst within the lower lumbar spine including L3-S1.  Unremarkable appearance of the visualized abdomen.  Vascular calcifications.  IMPRESSION: Negative for acute bony abnormality.  Multilevel degenerative disc disease.  Atherosclerosis.  Signed,  Dulcy Fanny. Earleen Newport, DO  [facility]  [facility]   Electronically Signed   By: Corrie Mckusick D.O.   On: 08/02/2014 13:21   Ct Lumbar Spine W Contrast  07/16/2014   CLINICAL DATA:  Low back pain. RIGHT leg pain. Symptom duration unspecified.  EXAM: LUMBAR MYELOGRAM  FLUOROSCOPY TIME:  36 seconds.  Eighteen spot films.  PROCEDURE: After thorough discussion of risks and benefits of the procedure including bleeding, infection, injury to nerves, blood vessels, adjacent structures as well as headache and CSF leak, written and oral informed consent was obtained. Consent was obtained by Dr. Rolla Flatten. Time out form was completed.  Patient was positioned prone on the fluoroscopy table. Local anesthesia was provided with 1% lidocaine without epinephrine after prepped and draped in the usual sterile fashion. Puncture was performed at L2-3 using a 3 1/2 inch 22-gauge spinal needle via midline approach. Using a single pass through the dura, the needle was placed within the thecal sac, with return of clear CSF. 15 mL of Omnipaque-180 was injected into the thecal sac, with normal opacification of the  nerve roots and cauda equina consistent with free flow within the subarachnoid space.  I personally performed the lumbar puncture and administered the intrathecal contrast. I also personally supervised acquisition of the myelogram images.  TECHNIQUE: Contiguous axial images were obtained through the Lumbar spine after the intrathecal infusion of infusion. Coronal and sagittal reconstructions were obtained of the axial image sets.  COMPARISON:  Outside MRI from [place].  FINDINGS: LUMBAR MYELOGRAM FINDINGS:  Good opacification of the lumbar subarachnoid space. Minor degenerative scoliosis convex RIGHT upper lumbar region. Asymmetric loss of interspace height at L4-5 on the RIGHT is contributory. Waist like narrowing at L4-5 with moderate to severe stenosis. BILATERAL L5 nerve root encroachment is worse on the RIGHT.  Severe disc space narrowing is present at L2-L3. Mild to moderate disc space narrowing at L3-4 and L4-5 with endplate reactive changes.  With patient standing, stenosis at L4-5 is worse as seen on the AP view.  Standing lateral flexion extension views demonstrate anatomic alignment without dynamic instability.  CT LUMBAR MYELOGRAM FINDINGS:  Segmentation: Normal.  Alignment:  Normal.  Vertebrae: No worrisome osseous lesion.  Conus medullaris: Normal in size, signal, and location.  Paraspinal tissues: No evidence for hydronephrosis or paravertebral mass.  Disc levels:  L1-L2:  Normal.  L2-L3: Trace retrolisthesis of 1 mm. Advanced disc space narrowing. Endplate reactive changes are worse on the LEFT. Leftward disc osteophyte complex narrows the foramen and subarticular zone. Slight LEFT L2 and LEFT L3 nerve root impingement is not excluded.  L3-L4: Mild bulge. Mild facet arthropathy. Mild stenosis. No definite L4 or L3 nerve root impingement.  L4-L5: Trace anterolisthesis of 1 mm. Central disc protrusion. Advanced facet arthropathy and ligamentum flavum hypertrophy. Moderate central canal stenosis. Asymmetric  loss of interspace height on the RIGHT with subchondral cyst formation. RIGHT greater than LEFT L4 and L5 nerve root impingement. Sclerotic change of the L4 and L5 spinous process these suggesting Baastrup's disease.  L5-S1: Shallow central and leftward protrusion. Mild facet arthropathy. No significant subarticular zone or foraminal zone narrowing.  IMPRESSION: LUMBAR MYELOGRAM IMPRESSION:  The dominant area of stenosis is  at L4-5 where BILATERAL RIGHT greater than LEFT L5 nerve root impingement is observed. This is worse with patient standing.  No significant dynamic instability with regard to anterolisthesis at L4-5 or elsewhere.  CT LUMBAR MYELOGRAM IMPRESSION:  Multifactorial moderately severe spinal stenosis at L4-5 related to central disc protrusion, posterior element hypertrophy, and asymmetric disc space narrowing on the RIGHT. RIGHT greater than LEFT L4 and L5 nerve root impingement is present.  Minor lumbar spondylosis elsewhere as described.   Electronically Signed   By: Rolla Flatten M.D.   On: 07/16/2014 12:40   Dg Spine Portable 1 View  08/07/2014   CLINICAL DATA:  Intra upper localization for L4-5 lumbar decompression  EXAM: PORTABLE SPINE - 1 VIEW  COMPARISON:  08/07/2014.  08/02/2014.  FINDINGS: Posterior surgical instrument is overlying the L4-5 posterior disc space.  IMPRESSION: Intraoperative localization of L4-5.   Electronically Signed   By: Rolm Baptise M.D.   On: 08/07/2014 13:29   Dg Spine Portable 1 View  08/07/2014   CLINICAL DATA:  Lumbar decompression L3-L4  EXAM: PORTABLE SPINE - 1 VIEW  COMPARISON:  Intraoperative lumbar spine radiographs - earlier same day ; lumbar spine radiographs- 08/02/2014  FINDINGS: A single spot lateral projection radiographic image of the lower lumbar spine is provided for review  Lumbar spinal labeling is in keeping with preprocedural lumbar spine radiographs performed 08/02/2014.  A radiopaque marking instrument overlies the soft tissues posterior to the  L3-L4 intervertebral disc space. Additional radiopaque support apparatus is seen posterior to the operative site.  IMPRESSION: Intraoperative localization of L3-L4.   Electronically Signed   By: Sandi Mariscal M.D.   On: 08/07/2014 13:15   Dg Spine Portable 1 View  08/07/2014   CLINICAL DATA:  Surgical level L4-5.  EXAM: PORTABLE SPINE - 1 VIEW  COMPARISON:  08/07/2014  FINDINGS: Posterior surgical instruments are directed at the L4-5 level and along the L4 spinous process.  IMPRESSION: Intraoperative localization as above.   Electronically Signed   By: Rolm Baptise M.D.   On: 08/07/2014 12:17   Dg Spine Portable 1 View  08/07/2014   CLINICAL DATA:  Surgical localization.  L4-L5 surgery.  EXAM: PORTABLE SPINE - 1 VIEW  COMPARISON:  08/02/2014.  FINDINGS: Two localization needles project over the superior and inferior aspect of the L4 spinous process. Numbering used on radiograph 08/02/2014 is preserved.  IMPRESSION: Intraoperative localization with both needles over the L4 spinous process.   Electronically Signed   By: Dereck Ligas M.D.   On: 08/07/2014 11:59   Dg Myelography Lumbar Inj Lumbosacral  07/16/2014   CLINICAL DATA:  Low back pain. RIGHT leg pain. Symptom duration unspecified.  EXAM: LUMBAR MYELOGRAM  FLUOROSCOPY TIME:  36 seconds.  Eighteen spot films.  PROCEDURE: After thorough discussion of risks and benefits of the procedure including bleeding, infection, injury to nerves, blood vessels, adjacent structures as well as headache and CSF leak, written and oral informed consent was obtained. Consent was obtained by Dr. Rolla Flatten. Time out form was completed.  Patient was positioned prone on the fluoroscopy table. Local anesthesia was provided with 1% lidocaine without epinephrine after prepped and draped in the usual sterile fashion. Puncture was performed at L2-3 using a 3 1/2 inch 22-gauge spinal needle via midline approach. Using a single pass through the dura, the needle was placed within the  thecal sac, with return of clear CSF. 15 mL of Omnipaque-180 was injected into the thecal sac, with normal opacification of the  nerve roots and cauda equina consistent with free flow within the subarachnoid space.  I personally performed the lumbar puncture and administered the intrathecal contrast. I also personally supervised acquisition of the myelogram images.  TECHNIQUE: Contiguous axial images were obtained through the Lumbar spine after the intrathecal infusion of infusion. Coronal and sagittal reconstructions were obtained of the axial image sets.  COMPARISON:  Outside MRI from [place].  FINDINGS: LUMBAR MYELOGRAM FINDINGS:  Good opacification of the lumbar subarachnoid space. Minor degenerative scoliosis convex RIGHT upper lumbar region. Asymmetric loss of interspace height at L4-5 on the RIGHT is contributory. Waist like narrowing at L4-5 with moderate to severe stenosis. BILATERAL L5 nerve root encroachment is worse on the RIGHT.  Severe disc space narrowing is present at L2-L3. Mild to moderate disc space narrowing at L3-4 and L4-5 with endplate reactive changes.  With patient standing, stenosis at L4-5 is worse as seen on the AP view.  Standing lateral flexion extension views demonstrate anatomic alignment without dynamic instability.  CT LUMBAR MYELOGRAM FINDINGS:  Segmentation: Normal.  Alignment:  Normal.  Vertebrae: No worrisome osseous lesion.  Conus medullaris: Normal in size, signal, and location.  Paraspinal tissues: No evidence for hydronephrosis or paravertebral mass.  Disc levels:  L1-L2:  Normal.  L2-L3: Trace retrolisthesis of 1 mm. Advanced disc space narrowing. Endplate reactive changes are worse on the LEFT. Leftward disc osteophyte complex narrows the foramen and subarticular zone. Slight LEFT L2 and LEFT L3 nerve root impingement is not excluded.  L3-L4: Mild bulge. Mild facet arthropathy. Mild stenosis. No definite L4 or L3 nerve root impingement.  L4-L5: Trace anterolisthesis of 1 mm.  Central disc protrusion. Advanced facet arthropathy and ligamentum flavum hypertrophy. Moderate central canal stenosis. Asymmetric loss of interspace height on the RIGHT with subchondral cyst formation. RIGHT greater than LEFT L4 and L5 nerve root impingement. Sclerotic change of the L4 and L5 spinous process these suggesting Baastrup's disease.  L5-S1: Shallow central and leftward protrusion. Mild facet arthropathy. No significant subarticular zone or foraminal zone narrowing.  IMPRESSION: LUMBAR MYELOGRAM IMPRESSION:  The dominant area of stenosis is at L4-5 where BILATERAL RIGHT greater than LEFT L5 nerve root impingement is observed. This is worse with patient standing.  No significant dynamic instability with regard to anterolisthesis at L4-5 or elsewhere.  CT LUMBAR MYELOGRAM IMPRESSION:  Multifactorial moderately severe spinal stenosis at L4-5 related to central disc protrusion, posterior element hypertrophy, and asymmetric disc space narrowing on the RIGHT. RIGHT greater than LEFT L4 and L5 nerve root impingement is present.  Minor lumbar spondylosis elsewhere as described.   Electronically Signed   By: Rolla Flatten M.D.   On: 07/16/2014 12:40    Hospital Course: Raymond Logan is a 81 y.o. who was admitted to [facility]. They were brought to the operating room on 08/07/2014 and underwent Procedure(s): complete DECOMPRESSION LUMBAR LAMINECTOMY/MICRODISCECTOMY OF L4-L5 for spinal stenosis, DECOMPRESSION OF L3-L4.  Patient tolerated the procedure well and was later transferred to the recovery room and then to the orthopaedic floor for postoperative care.  They were given PO and IV analgesics for pain control following their surgery.  They were given 24 hours of postoperative antibiotics of  Anti-infectives    Start     Dose/Rate Route Frequency Ordered Stop   08/07/14 2200  vancomycin (VANCOCIN) IVPB 1000 mg/200 mL premix     1,000 mg 200 mL/hr over 60 Minutes Intravenous  Once 08/07/14 1558  08/08/14 0000   08/07/14 1336  polymyxin  B 500,000 Units, bacitracin 50,000 Units in sodium chloride irrigation 0.9 % 500 mL irrigation  Status:  Discontinued       As needed 08/07/14 1336 08/07/14 1356   08/07/14 0930  vancomycin (VANCOCIN) IVPB 1000 mg/200 mL premix     1,000 mg 200 mL/hr over 60 Minutes Intravenous On call to O.R. 08/07/14 0900 08/07/14 1121     and started on DVT prophylaxis in the form of Aspirin.   PT and OT were ordered. Discharge planning consulted to help with postop disposition and equipment needs.  Patient had a fair night on the evening of surgery.  They started to get up OOB with therapy on day one. Patient had issues with urinary retention requiring consult from urology and placement of foley catheter.  Continued to work with therapy into day two.  Dressing was changed on day two and the incision was clean and dry.  Patient was seen in rounds and was ready to go home with continuation of foley catheter and follow up with urology.   Diet: Cardiac diet Activity:WBAT Follow-up:in 2 weeks Disposition - Home Discharged Condition: stable   Discharge Instructions    Call MD / Call 911    Complete by:  As directed   If you experience chest pain or shortness of breath, CALL 911 and be transported to the hospital emergency room.  If you develope a fever above 101 F, pus (white drainage) or increased drainage or redness at the wound, or calf pain, call your surgeon's office.     Constipation Prevention    Complete by:  As directed   Drink plenty of fluids.  Prune juice may be helpful.  You may use a stool softener, such as Colace (over the counter) 100 mg twice a day.  Use MiraLax (over the counter) for constipation as needed.     Diet - low sodium heart healthy    Complete by:  As directed      Discharge instructions    Complete by:  As directed   Change your dressing daily. Shower only, no tub bath. For the first three days, remove dressing and place plastic wrap  over incision while your shower.  After showering, remove plastic covering and place a new dressing over the incision.  Take aspirin $RemoveBefo'325mg'[id number]$  twice daily to prevent blood clots.  Make appointment with urology regarding foley catheter.  Call if any temperatures greater than 101 or any wound complications: [phone] during the day and ask for Dr. Charlestine Night nurse, Brunilda Payor.     Increase activity slowly as tolerated    Complete by:  As directed             Medication List    STOP taking these medications        EXCEDRIN EXTRA STRENGTH PO     oxyCODONE-acetaminophen 7.5-325 MG per tablet  Commonly known as:  PERCOCET  Replaced by:  oxyCODONE-acetaminophen 5-325 MG per tablet     zolpidem 10 MG tablet  Commonly known as:  AMBIEN      TAKE these medications        alfuzosin 10 MG 24 hr tablet  Commonly known as:  UROXATRAL  Take 10 mg by mouth daily with breakfast. Patient stopped on 07/28/2014 due to hypotension     B-12 2500 MCG Tabs  Take 1 tablet by mouth daily.     bisacodyl 5 MG EC tablet  Commonly known as:  DULCOLAX  Take 1 tablet (5 mg total) by  mouth daily as needed for moderate constipation.     CoQ-10 100 MG Caps  Take 1 capsule by mouth daily.     donepezil 10 MG tablet  Commonly known as:  ARICEPT  Take 1 tablet (10 mg total) by mouth at bedtime. To fill mar1, 2016     EVENING PRIMROSE OIL PO  Take 1 tablet by mouth daily.     finasteride 5 MG tablet  Commonly known as:  PROSCAR  Take 5 mg by mouth daily.     Fish Oil 1200 MG Caps  Take 1 capsule by mouth daily.     gabapentin 600 MG tablet  Commonly known as:  NEURONTIN  Take 300-600 mg by mouth daily. Half tablet for the first week. Then increase to whole tab     gabapentin 100 MG capsule  Commonly known as:  NEURONTIN  Take 1 capsule by mouth 2 (two) times daily as needed (headache).     magnesium oxide 400 MG tablet  Commonly known as:  MAG-OX  Take 400 mg by mouth daily.     MENS  MULTIVITAMIN PLUS PO  Take 1 tablet by mouth daily.     methocarbamol 500 MG tablet  Commonly known as:  ROBAXIN  Take 1 tablet (500 mg total) by mouth every 6 (six) hours as needed for muscle spasms.     MOVE FREE PO  Take 200 mg by mouth daily.     nitrofurantoin (macrocrystal-monohydrate) 100 MG capsule  Commonly known as:  MACROBID  Take 1 capsule (100 mg total) by mouth at bedtime.     NON FORMULARY  Take 1 tablet by mouth daily. Focus factor     NON FORMULARY  Take 1 tablet by mouth daily. Memory complex     NON FORMULARY  Take 1 tablet by mouth daily. blueberry     oxyCODONE-acetaminophen 5-325 MG per tablet  Commonly known as:  PERCOCET/ROXICET  Take 1-2 tablets by mouth every 4 (four) hours as needed for moderate pain.     polyethylene glycol packet  Commonly known as:  MIRALAX / GLYCOLAX  Take 17 g by mouth daily as needed for mild constipation.     Resveratrol 250 MG Caps  Take 1 capsule by mouth daily.     SM CRANBERRY 300 MG tablet  Generic drug:  Cranberry  Take 300 mg by mouth daily.     Turmeric 500 MG Caps  Take 2 tablets by mouth daily.     Vitamin D3 2000 UNITS Tabs  Take 1 tablet by mouth daily.           Follow-up Information    Follow up with GIOFFRE,RONALD A, MD. Schedule an appointment as soon as possible for a visit in 2 weeks.   Specialty:  Orthopedic Surgery   Contact information:   [address] [place] [address] [phone]       Follow up with [facility], MATTHEW, MD In 1 week.   Specialty:  Urology   Contact information:   [place] [address] [phone]       Signed: Ardeen Jourdain, PA-C Orthopaedic Surgery 08/12/2014, 6:55 AM

## 2014-08-13 ENCOUNTER — Encounter (HOSPITAL_COMMUNITY): Payer: Self-pay | Admitting: Emergency Medicine

## 2014-08-13 ENCOUNTER — Emergency Department (HOSPITAL_COMMUNITY)
Admission: EM | Admit: 2014-08-13 | Discharge: 2014-08-14 | Disposition: A | Discharge status: Home / Self Care | Payer: Medicare Other | Attending: Emergency Medicine | Admitting: Emergency Medicine

## 2014-08-13 DIAGNOSIS — J449 Chronic obstructive pulmonary disease, unspecified: Secondary | ICD-10-CM | POA: Diagnosis not present

## 2014-08-13 DIAGNOSIS — R339 Retention of urine, unspecified: Secondary | ICD-10-CM | POA: Diagnosis present

## 2014-08-13 DIAGNOSIS — Z8711 Personal history of peptic ulcer disease: Secondary | ICD-10-CM | POA: Diagnosis not present

## 2014-08-13 DIAGNOSIS — Z87891 Personal history of nicotine dependence: Secondary | ICD-10-CM | POA: Diagnosis not present

## 2014-08-13 DIAGNOSIS — Z79899 Other long term (current) drug therapy: Secondary | ICD-10-CM | POA: Insufficient documentation

## 2014-08-13 DIAGNOSIS — Z8619 Personal history of other infectious and parasitic diseases: Secondary | ICD-10-CM | POA: Insufficient documentation

## 2014-08-13 DIAGNOSIS — Z8719 Personal history of other diseases of the digestive system: Secondary | ICD-10-CM | POA: Insufficient documentation

## 2014-08-13 DIAGNOSIS — Z8639 Personal history of other endocrine, nutritional and metabolic disease: Secondary | ICD-10-CM | POA: Diagnosis not present

## 2014-08-13 DIAGNOSIS — M1990 Unspecified osteoarthritis, unspecified site: Secondary | ICD-10-CM | POA: Diagnosis not present

## 2014-08-13 DIAGNOSIS — Z87438 Personal history of other diseases of male genital organs: Secondary | ICD-10-CM | POA: Insufficient documentation

## 2014-08-13 DIAGNOSIS — Z880 Allergy status to penicillin: Secondary | ICD-10-CM | POA: Insufficient documentation

## 2014-08-13 DIAGNOSIS — N401 Enlarged prostate with lower urinary tract symptoms: Secondary | ICD-10-CM | POA: Diagnosis not present

## 2014-08-13 MED ORDER — LIDOCAINE HCL 2 % EX GEL
CUTANEOUS | Status: AC
  Administered 2014-08-13: 23:00:00
  Filled 2014-08-13: qty 10

## 2014-08-13 MED ORDER — MORPHINE SULFATE 4 MG/ML IJ SOLN
4.0000 mg | Freq: Once | INTRAMUSCULAR | Status: AC
  Administered 2014-08-13: 4 mg via INTRAVENOUS
  Filled 2014-08-13: qty 1

## 2014-08-13 NOTE — ED Notes (Signed)
Pt had a catheter removed after having back surgery today and has had bleeding and has not voided now since 7pm. Alert and oriented.

## 2014-08-13 NOTE — ED Notes (Signed)
Daughter states that the pt has a very enlarged prostate and insists that no one put in the catheter except for a urologist.

## 2014-08-13 NOTE — ED Provider Notes (Addendum)
CSN: [id number]     Arrival date & time 08/13/14  2235 History   First MD Initiated Contact with Patient 08/13/14 2249     Chief Complaint  Patient presents with  . Urinary Retention     (Consider location/radiation/quality/duration/timing/severity/associated sxs/prior Treatment) HPI Patient has inability to urinate since 8 PM today. Feels abdominal fullness. And pressure urine abdomen and feeling of urgency. No treatment prior to coming here. Had Foley catheter pulled out earlier today. No other associated symptoms. Past Medical History  Diagnosis Date  . ALLERGIC RHINITIS 01/23/2007  . BENIGN PROSTATIC HYPERTROPHY 09/10/2006  . FATIGUE 01/08/2008  . GERD 04/15/2010  . Headache(784.0) 09/10/2006  . HOARSENESS 04/15/2010  . HYPERLIPIDEMIA 09/10/2006  . INSOMNIA-SLEEP DISORDER-UNSPEC 04/24/2009  . PEPTIC ULCER DISEASE 01/23/2007  . Chronic headaches   . COPD, MILD 04/03/2010    patient denies on preop visit of 08/02/2014   . Difficulty in urination   . H. pylori infection     hx of   . Arthritis    Past Surgical History  Procedure Laterality Date  . Cataract extraction    . Shoulder arthroscopy      x3, L & R  . Knee arthroscopy    . Hemorrhoid banding    . Lumbar laminectomy/decompression microdiscectomy Right 08/07/2014    Procedure: complete DECOMPRESSION LUMBAR LAMINECTOMY/MICRODISCECTOMY OF L4-L5 for spinal stenosis, DECOMPRESSION OF L3-L4;  Surgeon: Latanya Maudlin, MD;  Location: [facility];  Service: Orthopedics;  Laterality: Right;   Family History  Problem Relation Age of Onset  . Prostate cancer Brother    History  Substance Use Topics  . Smoking status: Former Research scientist (life sciences)  . Smokeless tobacco: Never Used  . Alcohol Use: Yes     Comment: rare    Review of Systems  Constitutional: Negative.   HENT: Negative.   Respiratory: Negative.   Cardiovascular: Negative.   Gastrointestinal: Negative.   Musculoskeletal: Negative.   Skin: Positive for wound.       Surgical wound  from recent lumbar laminectomy  Neurological: Negative.   Psychiatric/Behavioral: Negative.   All other systems reviewed and are negative.     Allergies  Penicillins; Alfuzosin; and Aricept  Home Medications   Prior to Admission medications   Medication Sig Start Date End Date Taking? Authorizing Provider  alfuzosin (UROXATRAL) 10 MG 24 hr tablet Take 10 mg by mouth daily with breakfast. Patient stopped on 07/28/2014 due to hypotension    Historical Provider, MD  bisacodyl (DULCOLAX) 5 MG EC tablet Take 1 tablet (5 mg total) by mouth daily as needed for moderate constipation. 08/09/14   Amber Cecilio Asper, PA-C  Cholecalciferol (VITAMIN D3) 2000 UNITS TABS Take 1 tablet by mouth daily.    Historical Provider, MD  Coenzyme Q10 (COQ-10) 100 MG CAPS Take 1 capsule by mouth daily.    Historical Provider, MD  Cranberry (SM CRANBERRY) 300 MG tablet Take 300 mg by mouth daily.     Historical Provider, MD  Cyanocobalamin (B-12) 2500 MCG TABS Take 1 tablet by mouth daily.    Historical Provider, MD  donepezil (ARICEPT) 10 MG tablet Take 1 tablet (10 mg total) by mouth at bedtime. To fill mar1, 2016 Patient taking differently: Take 10 mg by mouth at bedtime. To fill mar1, 2016 Patient stopped on approximately  07/14/2014 due to headaches and made memory worse per patient 04/03/14   Biagio Borg, MD  EVENING PRIMROSE OIL PO Take 1 tablet by mouth daily.    Historical Provider, MD  finasteride (  PROSCAR) 5 MG tablet Take 5 mg by mouth daily.    Historical Provider, MD  gabapentin (NEURONTIN) 100 MG capsule Take 1 capsule by mouth 2 (two) times daily as needed (headache).  07/30/14   Historical Provider, MD  gabapentin (NEURONTIN) 600 MG tablet Take 300-600 mg by mouth daily. Half tablet for the first week. Then increase to whole tab 07/30/14   Historical Provider, MD  Glucosamine-Chondroitin (MOVE FREE PO) Take 200 mg by mouth daily.    Historical Provider, MD  magnesium oxide (MAG-OX) 400 MG tablet Take 400  mg by mouth daily.    Historical Provider, MD  methocarbamol (ROBAXIN) 500 MG tablet Take 1 tablet (500 mg total) by mouth every 6 (six) hours as needed for muscle spasms. 08/09/14   Amber Constable, PA-C  Multiple Vitamins-Minerals (MENS MULTIVITAMIN PLUS PO) Take 1 tablet by mouth daily.    Historical Provider, MD  nitrofurantoin, macrocrystal-monohydrate, (MACROBID) 100 MG capsule Take 1 capsule (100 mg total) by mouth at bedtime. 08/09/14   Festus Aloe, MD  NON FORMULARY Take 1 tablet by mouth daily. Focus factor    Historical Provider, MD  NON FORMULARY Take 1 tablet by mouth daily. Memory complex    Historical Provider, MD  NON FORMULARY Take 1 tablet by mouth daily. blueberry    Historical Provider, MD  Omega-3 Fatty Acids (FISH OIL) 1200 MG CAPS Take 1 capsule by mouth daily.    Historical Provider, MD  oxyCODONE-acetaminophen (PERCOCET/ROXICET) 5-325 MG per tablet Take 1-2 tablets by mouth every 4 (four) hours as needed for moderate pain. 08/09/14   Amber Constable, PA-C  polyethylene glycol (MIRALAX / GLYCOLAX) packet Take 17 g by mouth daily as needed for mild constipation. 08/09/14   Amber Constable, PA-C  Resveratrol 250 MG CAPS Take 1 capsule by mouth daily.    Historical Provider, MD  Turmeric 500 MG CAPS Take 2 tablets by mouth daily.    Historical Provider, MD   BP 154/77 mmHg  Pulse 74  Temp(Src) 98.5 F (36.9 C) (Oral)  Resp 16  SpO2 96% Physical Exam  Constitutional: He appears well-developed and well-nourished.  HENT:  Head: Normocephalic and atraumatic.  Eyes: Conjunctivae are normal. Pupils are equal, round, and reactive to light.  Neck: Neck supple. No tracheal deviation present. No thyromegaly present.  Cardiovascular: Normal rate and regular rhythm.   No murmur heard. Pulmonary/Chest: Effort normal and breath sounds normal.  Abdominal: Soft. Bowel sounds are normal. He exhibits no distension. There is no tenderness.  Genitourinary: Penis normal.   Musculoskeletal: Normal range of motion. He exhibits no edema or tenderness.  Neurological: He is alert. Coordination normal.  Skin: Skin is warm and dry. No rash noted.  Psychiatric: He has a normal mood and affect.  Nursing note and vitals reviewed.   ED Course  Procedures (including critical care time) Labs Review Labs Reviewed  URINE CULTURE  URINALYSIS, ROUTINE W REFLEX MICROSCOPIC (NOT AT [facility])    Imaging Review No results found.   EKG Interpretation None     600 mL of urine in patient's bladder on bladder scan I attempted urinary catheterization with 16 French coud catheter,, uerthra numbed with lidocaine. met resistance. Attempt unsuccessful  MDM Final diagnoses:  None    Dr. Gaynelle Arabian consulted for urinary catheter placement  Diagnosis urinary retention   Plan patient to call Dr. Junious Silk tomorrow for follow up  Orlie Dakin, MD 08/14/14 [id number]  Orlie Dakin, MD 08/14/14 [id number]

## 2014-08-13 NOTE — ED Notes (Signed)
Assisted Dr. Winfred Leeds attempted to place a caudae catheter but was unable to place. Pt tolerated it fair.

## 2014-08-14 NOTE — ED Notes (Signed)
Assisted Dr. Georg Ruddle with placing a foley catheter.

## 2014-08-14 NOTE — Op Note (Signed)
Pre-operative diagnosis :   Acute urinary retention   Postoperative diagnosis:  Same  Operation:  Flexible bedside cysto, insertion of council catheter ( 47F)  Surgeon:  S. Gaynelle Arabian, MD  First assistant:  ER RN)  Anesthesia:  KY jelly  Preparation:  After IV premedication, the penis is prepped with betadine solution and draped in the usual fashion.   Review history:  81 yo male with recurrent post op urinary retention for cysto and catheter insertion.   Statement of  Likelihood of Success: Excellent. TIME-OUT observed.:  Procedure:  Flexible cystoscope is passed into the distal urethra without difficulty. The anterior urethra appears hyperemic with tissue trauma, but the scope is easily passed through the area, and the remaining urethra is normal in appearance. The prostate has an elevated bladder neck. The bladder itself appears wnl, A 0.0-38 guidewire is then passed across the bladder neck, and the scope removed. A 47F Council catheter is then passed into the bladder, and 10cc is passed into the balloon,. > 1000 cc urine is passed.  The patient is relieved. He will be allowed to be discharged and f/u with Dr. Junious Silk.

## 2014-08-14 NOTE — Discharge Instructions (Signed)
Call Dr.Eskidge in the morning to schedule  office in office follow-up visit Acute Urinary Retention Acute urinary retention is the temporary inability to urinate. This is a common problem in older men. As men age their prostates become larger and block the flow of urine from the bladder. This is usually a problem that has come on gradually.  HOME CARE INSTRUCTIONS If you are sent home with a Foley catheter and a drainage system, you will need to discuss the best course of action with your health care provider. While the catheter is in, maintain a good intake of fluids. Keep the drainage bag emptied and lower than your catheter. This is so that contaminated urine will not flow back into your bladder, which could lead to a urinary tract infection. There are two main types of drainage bags. One is a large bag that usually is used at night. It has a good capacity that will allow you to sleep through the night without having to empty it. The second type is called a leg bag. It has a smaller capacity, so it needs to be emptied more frequently. However, the main advantage is that it can be attached by a leg strap and can go underneath your clothing, allowing you the freedom to move about or leave your home. Only take over-the-counter or prescription medicines for pain, discomfort, or fever as directed by your health care provider.  SEEK MEDICAL CARE IF:  You develop a low-grade fever.  You experience spasms or leakage of urine with the spasms. SEEK IMMEDIATE MEDICAL CARE IF:   You develop chills or fever.  Your catheter stops draining urine.  Your catheter falls out.  You start to develop increased bleeding that does not respond to rest and increased fluid intake. MAKE SURE YOU:  Understand these instructions.  Will watch your condition.  Will get help right away if you are not doing well or get worse. Document Released: 05/24/2000 Document Revised: 02/20/2013 Document Reviewed:  07/27/2012 [facility] Patient Information 2015 [place], [place]. This information is not intended to replace advice given to you by your health care provider. Make sure you discuss any questions you have with your health care provider.

## 2014-08-14 NOTE — Consult Note (Signed)
Urology Consult  Referring physician:   Dr. Hoover Brunette Reason for referral:  Urinary retention Cc: Dr. Junious Silk Chief Complaint:  Unable  To urinate  History of Present Illness:   81 yo male, with known large prostate ( 160 grams), and significant BPH symptoms. He is post cystoscopy showing enlarged lateral lobes, and large median lobe. He has failed alpha blockers because of hypotension.    He has had recent back surgery and has had post op urinary retention, with foley catheter removed today. He has not been able to void tonight and is seen in ED.   Past Medical History  Diagnosis Date  . ALLERGIC RHINITIS 01/23/2007  . BENIGN PROSTATIC HYPERTROPHY 09/10/2006  . FATIGUE 01/08/2008  . GERD 04/15/2010  . Headache(784.0) 09/10/2006  . HOARSENESS 04/15/2010  . HYPERLIPIDEMIA 09/10/2006  . INSOMNIA-SLEEP DISORDER-UNSPEC 04/24/2009  . PEPTIC ULCER DISEASE 01/23/2007  . Chronic headaches   . COPD, MILD 04/03/2010    patient denies on preop visit of 08/02/2014   . Difficulty in urination   . H. pylori infection     hx of   . Arthritis    Past Surgical History  Procedure Laterality Date  . Cataract extraction    . Shoulder arthroscopy      x3, L & R  . Knee arthroscopy    . Hemorrhoid banding    . Lumbar laminectomy/decompression microdiscectomy Right 08/07/2014    Procedure: complete DECOMPRESSION LUMBAR LAMINECTOMY/MICRODISCECTOMY OF L4-L5 for spinal stenosis, DECOMPRESSION OF L3-L4;  Surgeon: Latanya Maudlin, MD;  Location: [facility];  Service: Orthopedics;  Laterality: Right;    Medications: I have reviewed the patient's current medications. Allergies:  Allergies  Allergen Reactions  . Penicillins Hives       . Alfuzosin Other (See Comments)    BP went crazy, dizzy, passing out   . Aricept [Donepezil Hcl]     Insomnia, headache    Family History  Problem Relation Age of Onset  . Prostate cancer Brother    Social History:  reports that he has quit smoking. He has never used  smokeless tobacco. He reports that he drinks alcohol. He reports that he does not use illicit drugs.  ROS: All systems are reviewed and negative except as noted.  hematuria.   Physical Exam:  Vital signs in last 24 hours: Temp:  [98.5 F (36.9 C)] 98.5 F (36.9 C) (06/14 2246) Pulse Rate:  [74] 74 (06/14 2246) Resp:  [16] 16 (06/14 2246) BP: (154)/(77) 154/77 mmHg (06/14 2246) SpO2:  [96 %] 96 % (06/14 2246)  Cardiovascular: Skin warm; not flushed Respiratory: Breaths quiet; no shortness of breath Abdomen: No masses Neurological: Normal sensation to touch Musculoskeletal: Normal motor function arms and legs Lymphatics: No inguinal adenopathy Skin: No rashes Genitourinary: blood at meatal tip.   Laboratory Data:  No results found for this or any previous visit (from the past 72 hour(s)). No results found for this or any previous visit (from the past 240 hour(s)). Creatinine: No results for input(s): CREATININE in the last 168 hours.  Xrays:   Impression/Assessment:  Acute urinary retention. Has needed cysto for catheter insertion in recent past ( Dr/ Dahlsted).   Plan:    Cysto and catheter insertion.   Tomy Khim I Licet Dunphy 08/14/2014, 12:44 AM

## 2014-08-17 ENCOUNTER — Encounter (HOSPITAL_COMMUNITY): Payer: Self-pay | Admitting: Emergency Medicine

## 2014-08-17 ENCOUNTER — Emergency Department (HOSPITAL_COMMUNITY)
Admission: EM | Admit: 2014-08-17 | Discharge: 2014-08-17 | Disposition: A | Discharge status: Home / Self Care | Payer: Medicare Other | Attending: Emergency Medicine | Admitting: Emergency Medicine

## 2014-08-17 DIAGNOSIS — Z87891 Personal history of nicotine dependence: Secondary | ICD-10-CM | POA: Insufficient documentation

## 2014-08-17 DIAGNOSIS — Z8711 Personal history of peptic ulcer disease: Secondary | ICD-10-CM | POA: Insufficient documentation

## 2014-08-17 DIAGNOSIS — Z79899 Other long term (current) drug therapy: Secondary | ICD-10-CM | POA: Diagnosis not present

## 2014-08-17 DIAGNOSIS — T83098A Other mechanical complication of other indwelling urethral catheter, initial encounter: Secondary | ICD-10-CM | POA: Diagnosis present

## 2014-08-17 DIAGNOSIS — N400 Enlarged prostate without lower urinary tract symptoms: Secondary | ICD-10-CM | POA: Insufficient documentation

## 2014-08-17 DIAGNOSIS — Z792 Long term (current) use of antibiotics: Secondary | ICD-10-CM | POA: Insufficient documentation

## 2014-08-17 DIAGNOSIS — R339 Retention of urine, unspecified: Secondary | ICD-10-CM | POA: Insufficient documentation

## 2014-08-17 DIAGNOSIS — Y846 Urinary catheterization as the cause of abnormal reaction of the patient, or of later complication, without mention of misadventure at the time of the procedure: Secondary | ICD-10-CM | POA: Diagnosis not present

## 2014-08-17 DIAGNOSIS — E785 Hyperlipidemia, unspecified: Secondary | ICD-10-CM | POA: Insufficient documentation

## 2014-08-17 DIAGNOSIS — K219 Gastro-esophageal reflux disease without esophagitis: Secondary | ICD-10-CM | POA: Insufficient documentation

## 2014-08-17 DIAGNOSIS — J449 Chronic obstructive pulmonary disease, unspecified: Secondary | ICD-10-CM | POA: Insufficient documentation

## 2014-08-17 DIAGNOSIS — Z8619 Personal history of other infectious and parasitic diseases: Secondary | ICD-10-CM | POA: Diagnosis not present

## 2014-08-17 DIAGNOSIS — M1990 Unspecified osteoarthritis, unspecified site: Secondary | ICD-10-CM | POA: Insufficient documentation

## 2014-08-17 NOTE — ED Notes (Signed)
Pt states a couple hours ago he felt like he needed to urinate, and instead of the urine draining through his foley catheter it began draining around it. Urine in cath bag appears bloody, patient/family member state they believe a clot may have blocked his catheter. Pt states his urologist gave specific orders for [place] not to remove it, and to call him prior to attempting to remove cath. They state the urologist said to first attempt to flush it.

## 2014-08-17 NOTE — ED Provider Notes (Signed)
CSN: [id number]     Arrival date & time 08/17/14  1645 History   First MD Initiated Contact with Patient 08/17/14 1659     Chief Complaint  Patient presents with  . Urinary Retention   HPI Patient presents to the emergency room for evaluation of a malfunctioning Foley catheter. The patient had issues with urinary retention recently and had a Foley catheter placed on June 15 in the emergency department. Initial attempts by the ED nurses and ED physician were unsuccessful. Dr. Gaynelle Arabian came to the emergency room to place the catheter. The patient had been doing well until this morning. He noticed primarily blood in the catheter bag.  He also noticed that he was draining urine around the catheter. Patient denies any trouble with abdominal pain. No fevers.  He Called  the urologist on-call and was told to come to the emergency room to have the catheter irrigated. Past Medical History  Diagnosis Date  . ALLERGIC RHINITIS 01/23/2007  . BENIGN PROSTATIC HYPERTROPHY 09/10/2006  . FATIGUE 01/08/2008  . GERD 04/15/2010  . Headache(784.0) 09/10/2006  . HOARSENESS 04/15/2010  . HYPERLIPIDEMIA 09/10/2006  . INSOMNIA-SLEEP DISORDER-UNSPEC 04/24/2009  . PEPTIC ULCER DISEASE 01/23/2007  . Chronic headaches   . COPD, MILD 04/03/2010    patient denies on preop visit of 08/02/2014   . Difficulty in urination   . H. pylori infection     hx of   . Arthritis    Past Surgical History  Procedure Laterality Date  . Cataract extraction    . Shoulder arthroscopy      x3, L & R  . Knee arthroscopy    . Hemorrhoid banding    . Lumbar laminectomy/decompression microdiscectomy Right 08/07/2014    Procedure: complete DECOMPRESSION LUMBAR LAMINECTOMY/MICRODISCECTOMY OF L4-L5 for spinal stenosis, DECOMPRESSION OF L3-L4;  Surgeon: Latanya Maudlin, MD;  Location: [facility];  Service: Orthopedics;  Laterality: Right;   Family History  Problem Relation Age of Onset  . Prostate cancer Brother    History  Substance Use Topics   . Smoking status: Former Research scientist (life sciences)  . Smokeless tobacco: Never Used  . Alcohol Use: Yes     Comment: rare    Review of Systems    Allergies  Penicillins; Alfuzosin; and Aricept  Home Medications   Prior to Admission medications   Medication Sig Start Date End Date Taking? Authorizing Provider  aspirin EC 325 MG tablet Take 325 mg by mouth daily.   Yes Historical Provider, MD  Cholecalciferol (VITAMIN D3) 2000 UNITS TABS Take 1 tablet by mouth daily.   Yes Historical Provider, MD  Coenzyme Q10 (COQ-10) 100 MG CAPS Take 1 capsule by mouth daily.   Yes Historical Provider, MD  Cranberry (SM CRANBERRY) 300 MG tablet Take 300 mg by mouth daily.    Yes Historical Provider, MD  Cyanocobalamin (B-12) 2500 MCG TABS Take 1 tablet by mouth daily.   Yes Historical Provider, MD  EVENING PRIMROSE OIL PO Take 1 tablet by mouth daily.   Yes Historical Provider, MD  finasteride (PROSCAR) 5 MG tablet Take 5 mg by mouth daily.   Yes Historical Provider, MD  gabapentin (NEURONTIN) 100 MG capsule Take 1 capsule by mouth 2 (two) times daily as needed (headache).  07/30/14  Yes Historical Provider, MD  gabapentin (NEURONTIN) 600 MG tablet Take 300-600 mg by mouth daily. Half tablet for the first week. Then increase to whole tab 07/30/14  Yes Historical Provider, MD  Glucosamine-Chondroitin (MOVE FREE PO) Take 200 mg by mouth  daily.   Yes Historical Provider, MD  magnesium oxide (MAG-OX) 400 MG tablet Take 400 mg by mouth daily.   Yes Historical Provider, MD  methocarbamol (ROBAXIN) 500 MG tablet Take 1 tablet (500 mg total) by mouth every 6 (six) hours as needed for muscle spasms. 08/09/14  Yes Amber Cecilio Asper, PA-C  Multiple Vitamins-Minerals (MENS MULTIVITAMIN PLUS PO) Take 1 tablet by mouth daily.   Yes Historical Provider, MD  NON FORMULARY Take 1 tablet by mouth daily. Focus factor   Yes Historical Provider, MD  NON FORMULARY Take 1 tablet by mouth daily. Memory complex   Yes Historical Provider, MD  NON  FORMULARY Take 1 tablet by mouth daily. blueberry   Yes Historical Provider, MD  Omega-3 Fatty Acids (FISH OIL) 1200 MG CAPS Take 1 capsule by mouth daily.   Yes Historical Provider, MD  oxyCODONE-acetaminophen (PERCOCET/ROXICET) 5-325 MG per tablet Take 1-2 tablets by mouth every 4 (four) hours as needed for moderate pain. 08/09/14  Yes Amber Constable, PA-C  polyethylene glycol (MIRALAX / GLYCOLAX) packet Take 17 g by mouth daily as needed for mild constipation. 08/09/14  Yes Amber Cecilio Asper, PA-C  Resveratrol 250 MG CAPS Take 1 capsule by mouth daily.   Yes Historical Provider, MD  Turmeric 500 MG CAPS Take 2 tablets by mouth daily.   Yes Historical Provider, MD  bisacodyl (DULCOLAX) 5 MG EC tablet Take 1 tablet (5 mg total) by mouth daily as needed for moderate constipation. Patient not taking: Reported on 08/17/2014 08/09/14   Ardeen Jourdain, PA-C  donepezil (ARICEPT) 10 MG tablet Take 1 tablet (10 mg total) by mouth at bedtime. To fill mar1, 2016 Patient taking differently: Take 10 mg by mouth at bedtime. To fill mar1, 2016 Patient stopped on approximately  07/14/2014 due to headaches and made memory worse per patient 04/03/14   Biagio Borg, MD  nitrofurantoin, macrocrystal-monohydrate, (MACROBID) 100 MG capsule Take 1 capsule (100 mg total) by mouth at bedtime. Patient not taking: Reported on 08/17/2014 08/09/14   Festus Aloe, MD   BP 150/67 mmHg  Pulse 60  Temp(Src) 99.2 F (37.3 C) (Oral)  Resp 18  SpO2 98% Physical Exam  Constitutional: He appears well-developed and well-nourished. No distress.  HENT:  Head: Normocephalic and atraumatic.  Right Ear: External ear normal.  Left Ear: External ear normal.  Eyes: Conjunctivae are normal. Right eye exhibits no discharge. Left eye exhibits no discharge. No scleral icterus.  Neck: Neck supple. No tracheal deviation present.  Cardiovascular: Normal rate, regular rhythm and intact distal pulses.   Pulmonary/Chest: Effort normal and breath  sounds normal. No stridor. No respiratory distress. He has no wheezes. He has no rales.  Abdominal: Soft. Bowel sounds are normal. He exhibits no distension. There is no tenderness. There is no rebound and no guarding.  Genitourinary:  Foley catheter bag has a small amount of bloody urine  Musculoskeletal: He exhibits no edema or tenderness.  Neurological: He is alert. He has normal strength. No cranial nerve deficit (no facial droop, extraocular movements intact, no slurred speech) or sensory deficit. He exhibits normal muscle tone. He displays no seizure activity. Coordination normal.  Skin: Skin is warm and dry. No rash noted.  Psychiatric: He has a normal mood and affect.  Nursing note and vitals reviewed.   ED Course  Procedures (including critical care time)   MDM   Final diagnoses:  Urinary retention    Pt catheter was irrigated.  Seen by urology in the ED.  Catheter  is functioning properly now.  Family was instructed on how to irrigate at home.  At this time there does not appear to be any evidence of an acute emergency medical condition and the patient appears stable for discharge with appropriate outpatient follow up.     Dorie Rank, MD 08/17/14 [phone]

## 2014-08-17 NOTE — Discharge Instructions (Signed)
Acute Urinary Retention °Acute urinary retention is the temporary inability to urinate. °This is a common problem in older men. As men age their prostates become larger and block the flow of urine from the bladder. This is usually a problem that has come on gradually.  °HOME CARE INSTRUCTIONS °If you are sent home with a Foley catheter and a drainage system, you will need to discuss the best course of action with your health care provider. While the catheter is in, maintain a good intake of fluids. Keep the drainage bag emptied and lower than your catheter. This is so that contaminated urine will not flow back into your bladder, which could lead to a urinary tract infection. °There are two main types of drainage bags. One is a large bag that usually is used at night. It has a good capacity that will allow you to sleep through the night without having to empty it. The second type is called a leg bag. It has a smaller capacity, so it needs to be emptied more frequently. However, the main advantage is that it can be attached by a leg strap and can go underneath your clothing, allowing you the freedom to move about or leave your home. °Only take over-the-counter or prescription medicines for pain, discomfort, or fever as directed by your health care provider.  °SEEK MEDICAL CARE IF: °· You develop a low-grade fever. °· You experience spasms or leakage of urine with the spasms. °SEEK IMMEDIATE MEDICAL CARE IF:  °· You develop chills or fever. °· Your catheter stops draining urine. °· Your catheter falls out. °· You start to develop increased bleeding that does not respond to rest and increased fluid intake. °MAKE SURE YOU: °· Understand these instructions. °· Will watch your condition. °· Will get help right away if you are not doing well or get worse. °Document Released: 05/24/2000 Document Revised: 02/20/2013 Document Reviewed: 07/27/2012 °[employer]® Patient Information ©2015 [employer]. This information is not  intended to replace advice given to you by your health care provider. Make sure you discuss any questions you have with your health care provider. ° °

## 2014-08-19 DIAGNOSIS — Z4789 Encounter for other orthopedic aftercare: Secondary | ICD-10-CM | POA: Diagnosis not present

## 2014-08-19 DIAGNOSIS — M791 Myalgia: Secondary | ICD-10-CM | POA: Diagnosis not present

## 2014-08-19 DIAGNOSIS — G43719 Chronic migraine without aura, intractable, without status migrainosus: Secondary | ICD-10-CM | POA: Diagnosis not present

## 2014-08-19 DIAGNOSIS — M542 Cervicalgia: Secondary | ICD-10-CM | POA: Diagnosis not present

## 2014-08-19 DIAGNOSIS — R51 Headache: Secondary | ICD-10-CM | POA: Diagnosis not present

## 2014-08-19 DIAGNOSIS — G518 Other disorders of facial nerve: Secondary | ICD-10-CM | POA: Diagnosis not present

## 2014-08-26 ENCOUNTER — Other Ambulatory Visit: Payer: Self-pay

## 2014-08-27 DIAGNOSIS — R339 Retention of urine, unspecified: Secondary | ICD-10-CM | POA: Diagnosis not present

## 2014-09-03 DIAGNOSIS — Z4789 Encounter for other orthopedic aftercare: Secondary | ICD-10-CM | POA: Diagnosis not present

## 2014-09-04 DIAGNOSIS — M791 Myalgia: Secondary | ICD-10-CM | POA: Diagnosis not present

## 2014-09-04 DIAGNOSIS — G43719 Chronic migraine without aura, intractable, without status migrainosus: Secondary | ICD-10-CM | POA: Diagnosis not present

## 2014-09-04 DIAGNOSIS — R51 Headache: Secondary | ICD-10-CM | POA: Diagnosis not present

## 2014-09-04 DIAGNOSIS — G518 Other disorders of facial nerve: Secondary | ICD-10-CM | POA: Diagnosis not present

## 2014-09-04 DIAGNOSIS — M542 Cervicalgia: Secondary | ICD-10-CM | POA: Diagnosis not present

## 2014-09-06 DIAGNOSIS — N401 Enlarged prostate with lower urinary tract symptoms: Secondary | ICD-10-CM | POA: Diagnosis not present

## 2014-09-06 DIAGNOSIS — N3941 Urge incontinence: Secondary | ICD-10-CM | POA: Diagnosis not present

## 2014-09-10 DIAGNOSIS — H25811 Combined forms of age-related cataract, right eye: Secondary | ICD-10-CM | POA: Diagnosis not present

## 2014-09-10 DIAGNOSIS — Z961 Presence of intraocular lens: Secondary | ICD-10-CM | POA: Diagnosis not present

## 2014-09-13 DIAGNOSIS — M4727 Other spondylosis with radiculopathy, lumbosacral region: Secondary | ICD-10-CM | POA: Diagnosis not present

## 2014-09-19 DIAGNOSIS — M4727 Other spondylosis with radiculopathy, lumbosacral region: Secondary | ICD-10-CM | POA: Diagnosis not present

## 2014-09-26 DIAGNOSIS — M542 Cervicalgia: Secondary | ICD-10-CM | POA: Diagnosis not present

## 2014-09-26 DIAGNOSIS — M791 Myalgia: Secondary | ICD-10-CM | POA: Diagnosis not present

## 2014-09-26 DIAGNOSIS — G518 Other disorders of facial nerve: Secondary | ICD-10-CM | POA: Diagnosis not present

## 2014-09-26 DIAGNOSIS — R51 Headache: Secondary | ICD-10-CM | POA: Diagnosis not present

## 2014-09-26 DIAGNOSIS — G43719 Chronic migraine without aura, intractable, without status migrainosus: Secondary | ICD-10-CM | POA: Diagnosis not present

## 2014-10-09 DIAGNOSIS — M791 Myalgia: Secondary | ICD-10-CM | POA: Diagnosis not present

## 2014-10-09 DIAGNOSIS — M542 Cervicalgia: Secondary | ICD-10-CM | POA: Diagnosis not present

## 2014-10-09 DIAGNOSIS — R51 Headache: Secondary | ICD-10-CM | POA: Diagnosis not present

## 2014-10-09 DIAGNOSIS — G518 Other disorders of facial nerve: Secondary | ICD-10-CM | POA: Diagnosis not present

## 2014-10-09 DIAGNOSIS — G43719 Chronic migraine without aura, intractable, without status migrainosus: Secondary | ICD-10-CM | POA: Diagnosis not present

## 2014-10-24 DIAGNOSIS — M542 Cervicalgia: Secondary | ICD-10-CM | POA: Diagnosis not present

## 2014-10-24 DIAGNOSIS — M791 Myalgia: Secondary | ICD-10-CM | POA: Diagnosis not present

## 2014-10-24 DIAGNOSIS — R51 Headache: Secondary | ICD-10-CM | POA: Diagnosis not present

## 2014-10-24 DIAGNOSIS — G518 Other disorders of facial nerve: Secondary | ICD-10-CM | POA: Diagnosis not present

## 2014-10-24 DIAGNOSIS — G43719 Chronic migraine without aura, intractable, without status migrainosus: Secondary | ICD-10-CM | POA: Diagnosis not present

## 2014-11-12 DIAGNOSIS — G518 Other disorders of facial nerve: Secondary | ICD-10-CM | POA: Diagnosis not present

## 2014-11-12 DIAGNOSIS — G43719 Chronic migraine without aura, intractable, without status migrainosus: Secondary | ICD-10-CM | POA: Diagnosis not present

## 2014-11-12 DIAGNOSIS — M791 Myalgia: Secondary | ICD-10-CM | POA: Diagnosis not present

## 2014-11-12 DIAGNOSIS — M542 Cervicalgia: Secondary | ICD-10-CM | POA: Diagnosis not present

## 2014-11-12 DIAGNOSIS — R51 Headache: Secondary | ICD-10-CM | POA: Diagnosis not present

## 2014-12-11 DIAGNOSIS — N401 Enlarged prostate with lower urinary tract symptoms: Secondary | ICD-10-CM | POA: Diagnosis not present

## 2014-12-11 DIAGNOSIS — N3941 Urge incontinence: Secondary | ICD-10-CM | POA: Diagnosis not present

## 2014-12-24 ENCOUNTER — Telehealth: Payer: Self-pay | Admitting: Internal Medicine

## 2014-12-31 DIAGNOSIS — G43719 Chronic migraine without aura, intractable, without status migrainosus: Secondary | ICD-10-CM | POA: Diagnosis not present

## 2015-01-29 DIAGNOSIS — N401 Enlarged prostate with lower urinary tract symptoms: Secondary | ICD-10-CM | POA: Diagnosis not present

## 2015-01-29 DIAGNOSIS — N3941 Urge incontinence: Secondary | ICD-10-CM | POA: Diagnosis not present

## 2015-01-29 DIAGNOSIS — R8271 Bacteriuria: Secondary | ICD-10-CM | POA: Diagnosis not present

## 2015-01-29 DIAGNOSIS — N138 Other obstructive and reflux uropathy: Secondary | ICD-10-CM | POA: Diagnosis not present

## 2015-02-13 DIAGNOSIS — M1712 Unilateral primary osteoarthritis, left knee: Secondary | ICD-10-CM | POA: Diagnosis not present

## 2015-02-13 DIAGNOSIS — M25562 Pain in left knee: Secondary | ICD-10-CM | POA: Diagnosis not present

## 2015-02-21 DIAGNOSIS — M25561 Pain in right knee: Secondary | ICD-10-CM | POA: Diagnosis not present

## 2015-02-21 DIAGNOSIS — M25562 Pain in left knee: Secondary | ICD-10-CM | POA: Diagnosis not present

## 2015-02-21 DIAGNOSIS — M170 Bilateral primary osteoarthritis of knee: Secondary | ICD-10-CM | POA: Diagnosis not present

## 2015-02-27 DIAGNOSIS — M1712 Unilateral primary osteoarthritis, left knee: Secondary | ICD-10-CM | POA: Diagnosis not present

## 2015-02-27 DIAGNOSIS — M170 Bilateral primary osteoarthritis of knee: Secondary | ICD-10-CM | POA: Diagnosis not present

## 2015-02-27 DIAGNOSIS — R2689 Other abnormalities of gait and mobility: Secondary | ICD-10-CM | POA: Diagnosis not present

## 2015-02-27 DIAGNOSIS — M25561 Pain in right knee: Secondary | ICD-10-CM | POA: Diagnosis not present

## 2015-02-27 DIAGNOSIS — M25562 Pain in left knee: Secondary | ICD-10-CM | POA: Diagnosis not present

## 2015-02-28 DIAGNOSIS — M170 Bilateral primary osteoarthritis of knee: Secondary | ICD-10-CM | POA: Diagnosis not present

## 2015-02-28 DIAGNOSIS — M25562 Pain in left knee: Secondary | ICD-10-CM | POA: Diagnosis not present

## 2015-02-28 DIAGNOSIS — R2689 Other abnormalities of gait and mobility: Secondary | ICD-10-CM | POA: Diagnosis not present

## 2015-02-28 DIAGNOSIS — M25561 Pain in right knee: Secondary | ICD-10-CM | POA: Diagnosis not present

## 2015-02-28 DIAGNOSIS — M1711 Unilateral primary osteoarthritis, right knee: Secondary | ICD-10-CM | POA: Diagnosis not present

## 2015-03-04 DIAGNOSIS — M25562 Pain in left knee: Secondary | ICD-10-CM | POA: Diagnosis not present

## 2015-03-04 DIAGNOSIS — M25561 Pain in right knee: Secondary | ICD-10-CM | POA: Diagnosis not present

## 2015-03-04 DIAGNOSIS — R2689 Other abnormalities of gait and mobility: Secondary | ICD-10-CM | POA: Diagnosis not present

## 2015-03-04 DIAGNOSIS — M1712 Unilateral primary osteoarthritis, left knee: Secondary | ICD-10-CM | POA: Diagnosis not present

## 2015-03-04 DIAGNOSIS — M170 Bilateral primary osteoarthritis of knee: Secondary | ICD-10-CM | POA: Diagnosis not present

## 2015-03-07 DIAGNOSIS — M25562 Pain in left knee: Secondary | ICD-10-CM | POA: Diagnosis not present

## 2015-03-07 DIAGNOSIS — M1711 Unilateral primary osteoarthritis, right knee: Secondary | ICD-10-CM | POA: Diagnosis not present

## 2015-03-07 DIAGNOSIS — R2689 Other abnormalities of gait and mobility: Secondary | ICD-10-CM | POA: Diagnosis not present

## 2015-03-07 DIAGNOSIS — M25561 Pain in right knee: Secondary | ICD-10-CM | POA: Diagnosis not present

## 2015-03-07 DIAGNOSIS — M170 Bilateral primary osteoarthritis of knee: Secondary | ICD-10-CM | POA: Diagnosis not present

## 2015-03-13 DIAGNOSIS — M170 Bilateral primary osteoarthritis of knee: Secondary | ICD-10-CM | POA: Diagnosis not present

## 2015-03-13 DIAGNOSIS — M25561 Pain in right knee: Secondary | ICD-10-CM | POA: Diagnosis not present

## 2015-03-13 DIAGNOSIS — M1712 Unilateral primary osteoarthritis, left knee: Secondary | ICD-10-CM | POA: Diagnosis not present

## 2015-03-13 DIAGNOSIS — R2689 Other abnormalities of gait and mobility: Secondary | ICD-10-CM | POA: Diagnosis not present

## 2015-03-13 DIAGNOSIS — M25562 Pain in left knee: Secondary | ICD-10-CM | POA: Diagnosis not present

## 2015-03-18 DIAGNOSIS — M170 Bilateral primary osteoarthritis of knee: Secondary | ICD-10-CM | POA: Diagnosis not present

## 2015-03-18 DIAGNOSIS — M1711 Unilateral primary osteoarthritis, right knee: Secondary | ICD-10-CM | POA: Diagnosis not present

## 2015-03-18 DIAGNOSIS — R2689 Other abnormalities of gait and mobility: Secondary | ICD-10-CM | POA: Diagnosis not present

## 2015-03-18 DIAGNOSIS — M25561 Pain in right knee: Secondary | ICD-10-CM | POA: Diagnosis not present

## 2015-03-18 DIAGNOSIS — M25562 Pain in left knee: Secondary | ICD-10-CM | POA: Diagnosis not present

## 2015-03-20 DIAGNOSIS — M170 Bilateral primary osteoarthritis of knee: Secondary | ICD-10-CM | POA: Diagnosis not present

## 2015-03-20 DIAGNOSIS — M25562 Pain in left knee: Secondary | ICD-10-CM | POA: Diagnosis not present

## 2015-03-20 DIAGNOSIS — M1712 Unilateral primary osteoarthritis, left knee: Secondary | ICD-10-CM | POA: Diagnosis not present

## 2015-03-20 DIAGNOSIS — R2689 Other abnormalities of gait and mobility: Secondary | ICD-10-CM | POA: Diagnosis not present

## 2015-03-20 DIAGNOSIS — M25561 Pain in right knee: Secondary | ICD-10-CM | POA: Diagnosis not present

## 2015-03-25 DIAGNOSIS — Z0000 Encounter for general adult medical examination without abnormal findings: Secondary | ICD-10-CM | POA: Diagnosis not present

## 2015-03-25 DIAGNOSIS — N138 Other obstructive and reflux uropathy: Secondary | ICD-10-CM | POA: Diagnosis not present

## 2015-03-25 DIAGNOSIS — N401 Enlarged prostate with lower urinary tract symptoms: Secondary | ICD-10-CM | POA: Diagnosis not present

## 2015-03-25 DIAGNOSIS — R972 Elevated prostate specific antigen [PSA]: Secondary | ICD-10-CM | POA: Diagnosis not present

## 2015-03-25 DIAGNOSIS — M25561 Pain in right knee: Secondary | ICD-10-CM | POA: Diagnosis not present

## 2015-03-25 DIAGNOSIS — R2689 Other abnormalities of gait and mobility: Secondary | ICD-10-CM | POA: Diagnosis not present

## 2015-03-25 DIAGNOSIS — N3941 Urge incontinence: Secondary | ICD-10-CM | POA: Diagnosis not present

## 2015-03-25 DIAGNOSIS — M170 Bilateral primary osteoarthritis of knee: Secondary | ICD-10-CM | POA: Diagnosis not present

## 2015-03-25 DIAGNOSIS — R8271 Bacteriuria: Secondary | ICD-10-CM | POA: Diagnosis not present

## 2015-03-25 DIAGNOSIS — M1711 Unilateral primary osteoarthritis, right knee: Secondary | ICD-10-CM | POA: Diagnosis not present

## 2015-03-25 DIAGNOSIS — M25562 Pain in left knee: Secondary | ICD-10-CM | POA: Diagnosis not present

## 2015-03-27 DIAGNOSIS — M170 Bilateral primary osteoarthritis of knee: Secondary | ICD-10-CM | POA: Diagnosis not present

## 2015-03-27 DIAGNOSIS — M25561 Pain in right knee: Secondary | ICD-10-CM | POA: Diagnosis not present

## 2015-03-27 DIAGNOSIS — M25562 Pain in left knee: Secondary | ICD-10-CM | POA: Diagnosis not present

## 2015-03-27 DIAGNOSIS — R2689 Other abnormalities of gait and mobility: Secondary | ICD-10-CM | POA: Diagnosis not present

## 2015-04-03 DIAGNOSIS — G43719 Chronic migraine without aura, intractable, without status migrainosus: Secondary | ICD-10-CM | POA: Diagnosis not present

## 2015-06-30 DIAGNOSIS — M170 Bilateral primary osteoarthritis of knee: Secondary | ICD-10-CM | POA: Diagnosis not present

## 2015-06-30 DIAGNOSIS — M25561 Pain in right knee: Secondary | ICD-10-CM | POA: Diagnosis not present

## 2015-06-30 DIAGNOSIS — M25562 Pain in left knee: Secondary | ICD-10-CM | POA: Diagnosis not present

## 2015-09-15 DIAGNOSIS — N401 Enlarged prostate with lower urinary tract symptoms: Secondary | ICD-10-CM | POA: Diagnosis not present

## 2015-09-22 DIAGNOSIS — N401 Enlarged prostate with lower urinary tract symptoms: Secondary | ICD-10-CM | POA: Diagnosis not present

## 2015-09-22 DIAGNOSIS — R350 Frequency of micturition: Secondary | ICD-10-CM | POA: Diagnosis not present

## 2015-09-22 DIAGNOSIS — R972 Elevated prostate specific antigen [PSA]: Secondary | ICD-10-CM | POA: Diagnosis not present

## 2015-10-02 DIAGNOSIS — G43719 Chronic migraine without aura, intractable, without status migrainosus: Secondary | ICD-10-CM | POA: Diagnosis not present

## 2015-10-07 DIAGNOSIS — Z961 Presence of intraocular lens: Secondary | ICD-10-CM | POA: Diagnosis not present

## 2015-10-07 DIAGNOSIS — H2511 Age-related nuclear cataract, right eye: Secondary | ICD-10-CM | POA: Diagnosis not present

## 2016-01-05 DIAGNOSIS — M25561 Pain in right knee: Secondary | ICD-10-CM | POA: Diagnosis not present

## 2016-01-05 DIAGNOSIS — M1712 Unilateral primary osteoarthritis, left knee: Secondary | ICD-10-CM | POA: Diagnosis not present

## 2016-01-05 DIAGNOSIS — M25562 Pain in left knee: Secondary | ICD-10-CM | POA: Diagnosis not present

## 2016-01-05 DIAGNOSIS — M170 Bilateral primary osteoarthritis of knee: Secondary | ICD-10-CM | POA: Diagnosis not present

## 2016-01-09 DIAGNOSIS — G3184 Mild cognitive impairment, so stated: Secondary | ICD-10-CM | POA: Diagnosis not present

## 2016-01-15 DIAGNOSIS — M25562 Pain in left knee: Secondary | ICD-10-CM | POA: Diagnosis not present

## 2016-01-15 DIAGNOSIS — M1712 Unilateral primary osteoarthritis, left knee: Secondary | ICD-10-CM | POA: Diagnosis not present

## 2016-01-20 DIAGNOSIS — M25562 Pain in left knee: Secondary | ICD-10-CM | POA: Diagnosis not present

## 2016-01-20 DIAGNOSIS — M1712 Unilateral primary osteoarthritis, left knee: Secondary | ICD-10-CM | POA: Diagnosis not present

## 2016-02-03 DIAGNOSIS — L820 Inflamed seborrheic keratosis: Secondary | ICD-10-CM | POA: Diagnosis not present

## 2016-02-03 DIAGNOSIS — L821 Other seborrheic keratosis: Secondary | ICD-10-CM | POA: Diagnosis not present

## 2016-02-03 DIAGNOSIS — D1801 Hemangioma of skin and subcutaneous tissue: Secondary | ICD-10-CM | POA: Diagnosis not present

## 2016-02-05 ENCOUNTER — Other Ambulatory Visit: Payer: Self-pay

## 2016-03-04 ENCOUNTER — Ambulatory Visit (INDEPENDENT_AMBULATORY_CARE_PROVIDER_SITE_OTHER): Payer: Medicare Other

## 2016-03-04 VITALS — BP 120/70 | HR 67 | Ht 69.0 in | Wt 163.0 lb

## 2016-03-04 DIAGNOSIS — Z0000 Encounter for general adult medical examination without abnormal findings: Secondary | ICD-10-CM | POA: Diagnosis not present

## 2016-03-04 NOTE — Progress Notes (Addendum)
Subjective:   Raymond Logan is a 83 y.o. male who presents for an Initial Medicare Annual Wellness Visit.  The Patient was informed that the wellness visit is to identify future health risk and educate and initiate measures that can reduce risk for increased disease through the lifespan.    Review of Systems  No ROS.  Medicare Wellness Visit. [place] for ping pong and stock market Cardiac Risk Factors include: advanced age (>26mn, >>87women);family history of premature cardiovascular disease;dyslipidemia;male gender Sleep patterns: Occasionally has problems staying asleep.  Home Safety/Smoke Alarms:  Smoke detectors and security in place.  Living environment; residence and Firearm Safety: Lives with wife in 2 story home. No firearms.  Seat Belt Safety/Bike Helmet: Wears seat belt.   Counseling:   Eye Exam-Last exam <1 year, followed yearly by Dr. GValetta Moleexam 11/2015, followed yearly by Dr. HYong Channel   Male:   [facility]S-Colonoscopy 05/11/2012, diverticulosis. No recall.      PSA-09/15/15, 0.8. Followed by [facility]     Objective:    Today's Vitals   03/04/16 1518  BP: 120/70  Pulse: 67  SpO2: 98%  Weight: 163 lb (73.9 kg)  Height: '5\' 9"'$  (1.753 m)  PainSc: 3    Body mass index is 24.07 kg/m.  Current Medications (verified) Outpatient Encounter Prescriptions as of 03/04/2016  Medication Sig  . finasteride (PROSCAR) 5 MG tablet Take 5 mg by mouth daily.  .Marland Kitchengabapentin (NEURONTIN) 600 MG tablet Take 300-600 mg by mouth daily. Half tablet for the first week. Then increase to whole tab  . silodosin (RAPAFLO) 8 MG CAPS capsule Take by mouth.  .Marland Kitchenaspirin EC 325 MG tablet Take 325 mg by mouth daily.  . bisacodyl (DULCOLAX) 5 MG EC tablet Take 1 tablet (5 mg total) by mouth daily as needed for moderate constipation. (Patient not taking: Reported on 03/04/2016)  . Cholecalciferol (VITAMIN D3) 2000 UNITS TABS Take 1 tablet by mouth daily.  . Coenzyme Q10 (COQ-10)  100 MG CAPS Take 1 capsule by mouth daily.  . Cranberry (SM CRANBERRY) 300 MG tablet Take 300 mg by mouth daily.   . Cyanocobalamin (B-12) 2500 MCG TABS Take 1 tablet by mouth daily.  .Marland Kitchendonepezil (ARICEPT) 10 MG tablet Take 1 tablet (10 mg total) by mouth at bedtime. To fill mar1, 2016 (Patient not taking: Reported on 03/04/2016)  . EVENING PRIMROSE OIL PO Take 1 tablet by mouth daily.  .Marland Kitchengabapentin (NEURONTIN) 100 MG capsule Take 1 capsule by mouth 2 (two) times daily as needed (headache).   . Glucosamine-Chondroitin (MOVE FREE PO) Take 200 mg by mouth daily.  . magnesium oxide (MAG-OX) 400 MG tablet Take 400 mg by mouth daily.  . methocarbamol (ROBAXIN) 500 MG tablet Take 1 tablet (500 mg total) by mouth every 6 (six) hours as needed for muscle spasms. (Patient not taking: Reported on 03/04/2016)  . Multiple Vitamins-Minerals (MENS MULTIVITAMIN PLUS PO) Take 1 tablet by mouth daily.  . nitrofurantoin, macrocrystal-monohydrate, (MACROBID) 100 MG capsule Take 1 capsule (100 mg total) by mouth at bedtime. (Patient not taking: Reported on 03/04/2016)  . NON FORMULARY Take 1 tablet by mouth daily. Focus factor  . NON FORMULARY Take 1 tablet by mouth daily. Memory complex  . NON FORMULARY Take 1 tablet by mouth daily. blueberry  . Omega-3 Fatty Acids (FISH OIL) 1200 MG CAPS Take 1 capsule by mouth daily.  .Marland KitchenoxyCODONE-acetaminophen (PERCOCET/ROXICET) 5-325 MG per tablet Take 1-2 tablets by mouth every 4 (  four) hours as needed for moderate pain. (Patient not taking: Reported on 03/04/2016)  . polyethylene glycol (MIRALAX / GLYCOLAX) packet Take 17 g by mouth daily as needed for mild constipation. (Patient not taking: Reported on 03/04/2016)  . Resveratrol 250 MG CAPS Take 1 capsule by mouth daily.  . traZODone (DESYREL) 50 MG tablet Take 25 mg by mouth at bedtime.   . Turmeric 500 MG CAPS Take 2 tablets by mouth daily.   No facility-administered encounter medications on file as of 03/04/2016.     Allergies  (verified) Penicillins; Alfuzosin; and Aricept [donepezil hcl]   History: Past Medical History:  Diagnosis Date  . ALLERGIC RHINITIS 01/23/2007  . Arthritis   . BENIGN PROSTATIC HYPERTROPHY 09/10/2006  . Chronic headaches   . COPD, MILD 04/03/2010   patient denies on preop visit of 08/02/2014   . Difficulty in urination   . FATIGUE 01/08/2008  . GERD 04/15/2010  . H. pylori infection    hx of   . Headache(784.0) 09/10/2006  . HOARSENESS 04/15/2010  . HYPERLIPIDEMIA 09/10/2006  . INSOMNIA-SLEEP DISORDER-UNSPEC 04/24/2009  . PEPTIC ULCER DISEASE 01/23/2007   Past Surgical History:  Procedure Laterality Date  . CATARACT EXTRACTION    . HEMORRHOID BANDING    . KNEE ARTHROSCOPY    . LUMBAR LAMINECTOMY/DECOMPRESSION MICRODISCECTOMY Right 08/07/2014   Procedure: complete DECOMPRESSION LUMBAR LAMINECTOMY/MICRODISCECTOMY OF L4-L5 for spinal stenosis, DECOMPRESSION OF L3-L4;  Surgeon: Latanya Maudlin, MD;  Location: [facility];  Service: Orthopedics;  Laterality: Right;  . SHOULDER ARTHROSCOPY     x3, L & R   Family History  Problem Relation Age of Onset  . Prostate cancer Brother   . Alzheimer's disease Mother   . Alzheimer's disease Sister    Social History   Occupational History  . Retired Retired   Social History Main Topics  . Smoking status: Former Research scientist (life sciences)  . Smokeless tobacco: Never Used  . Alcohol use Yes     Comment: rare  . Drug use: No  . Sexual activity: Not on file   Tobacco Counseling Counseling given: Not Answered   Activities of Daily Living In your present state of health, do you have any difficulty performing the following activities: 03/04/2016  Hearing? N  Vision? N  Difficulty concentrating or making decisions? N  Walking or climbing stairs? Y  Dressing or bathing? N  Doing errands, shopping? N  Preparing Food and eating ? N  Using the Toilet? N  In the past six months, have you accidently leaked urine? Y  Do you have problems with loss of bowel control? N    Managing your Medications? N  Managing your Finances? N  Housekeeping or managing your Housekeeping? N  Some recent data might be hidden    Immunizations and Health Maintenance Immunization History  Administered Date(s) Administered  . Influenza Split 12/01/2010, 12/31/2011  . Influenza Whole 12/11/2007, 12/02/2009  . Influenza, High Dose Seasonal PF 11/14/2013  . Influenza-Unspecified 12/30/2012  . Pneumococcal Polysaccharide-23 10/30/1997, 01/08/2008  . Td 03/01/1997, 01/08/2008   Health Maintenance Due  Topic Date Due  . ZOSTAVAX  10/04/1992  . PNA vac Low Risk Adult (2 of 2 - PCV13) 01/07/2009  . INFLUENZA VACCINE  09/30/2015    Patient Care Team: Biagio Borg, MD as PCP - General Katherine Roan, MD (Geriatric Medicine) Festus Aloe, MD as Consulting Physician (Urology) Harriett Sine, MD as Consulting Physician (Dermatology) Debbra Riding, MD as Consulting Physician (Ophthalmology)  Indicate any recent Medical Services you  may have received from other than [facility] providers in the past year (date may be approximate).    Assessment:   This is a routine wellness examination for [employer]. Physical assessment deferred to PCP.   Hearing/Vision screen Hearing Screening Comments: Able to hear conversational tones w/o difficulty. No issues reported.   Vision Screening Comments: Wears glasses. Cataract (left) surgery.  Dietary issues and exercise activities discussed: Current Exercise Habits: Home exercise routine, Type of exercise: Other - see comments (recumbant bike, ping pong), Time (Minutes): 60, Frequency (Times/Week): 1, Weekly Exercise (Minutes/Week): 60, Intensity: Mild, Exercise limited by: orthopedic condition(s) (knee)   Diet (meal preparation, eat out, water intake, caffeinated beverages, dairy products, fruits and vegetables): Eats at home. Drinks half a soda.   Breakfast: Breakfast bar, coffee (decaf, 1 cup) Lunch: sandwich Dinner: eggs,  cereal, pizza  Discussed eating heart healthy diet and increasing activity and water intake.   Goals      Patient Stated   . patient (pt-stated)          "a good knee". Will increase activity to help with knee pain and stay flexible.       Depression Screen PHQ 2/9 Scores 03/04/2016  PHQ - 2 Score 0    Fall Risk Fall Risk  03/04/2016 02/05/2016  Falls in the past year? No No    Cognitive Function: MMSE - Mini Mental State Exam 03/04/2016  Orientation to time 5  Orientation to Place 5  Registration 3  Attention/ Calculation 3  Recall 3  Language- name 2 objects 2  Language- repeat 1  Language- follow 3 step command 3  Language- read & follow direction 1  Write a sentence 1  Copy design 1  Total score 28        Screening Tests Health Maintenance  Topic Date Due  . ZOSTAVAX  10/04/1992  . PNA vac Low Risk Adult (2 of 2 - PCV13) 01/07/2009  . INFLUENZA VACCINE  09/30/2015  . TETANUS/TDAP  01/07/2018        Plan:     Continue to eat heart healthy diet (full of fruits, vegetables, whole grains, lean protein, water--limit salt, fat, and sugar intake) and increase physical activity as tolerated.  Continue doing brain stimulating activities (puzzles, reading, adult coloring books, staying active) to keep memory sharp.   Bring a copy of your advance directives to your next office visit.   During the course of the visit Hilario was educated and counseled about the following appropriate screening and preventive services:   Vaccines to include Pneumoccal, Influenza, Hepatitis B, Td, Zostavax, HCV  Electrocardiogram  Colorectal cancer screening  Cardiovascular disease screening  Diabetes screening  Glaucoma screening  Nutrition counseling  Prostate cancer screening  Patient Instructions (the written plan) were given to the patient.   Gerilyn Nestle, RN   03/04/2016   Medical screening examination/treatment/procedure(s) were performed by non-physician  practitioner and as supervising physician I was immediately available for consultation/collaboration. I agree with above. Cathlean Cower, MD

## 2016-03-04 NOTE — Progress Notes (Signed)
Pre visit review using our clinic review tool, if applicable. No additional management support is needed unless otherwise documented below in the visit note. 

## 2016-03-04 NOTE — Patient Instructions (Addendum)
Continue to eat heart healthy diet (full of fruits, vegetables, whole grains, lean protein, water--limit salt, fat, and sugar intake) and increase physical activity as tolerated.  Continue doing brain stimulating activities (puzzles, reading, adult coloring books, staying active) to keep memory sharp.   Bring a copy of your advance directives to your next office visit.    Fall Prevention in the Home Introduction Falls can cause injuries. They can happen to people of all ages. There are many things you can do to make your home safe and to help prevent falls. What can I do on the outside of my home?  Regularly fix the edges of walkways and driveways and fix any cracks.  Remove anything that might make you trip as you walk through a door, such as a raised step or threshold.  Trim any bushes or trees on the path to your home.  Use bright outdoor lighting.  Clear any walking paths of anything that might make someone trip, such as rocks or tools.  Regularly check to see if handrails are loose or broken. Make sure that both sides of any steps have handrails.  Any raised decks and porches should have guardrails on the edges.  Have any leaves, snow, or ice cleared regularly.  Use sand or salt on walking paths during winter.  Clean up any spills in your garage right away. This includes oil or grease spills. What can I do in the bathroom?  Use night lights.  Install grab bars by the toilet and in the tub and shower. Do not use towel bars as grab bars.  Use non-skid mats or decals in the tub or shower.  If you need to sit down in the shower, use a plastic, non-slip stool.  Keep the floor dry. Clean up any water that spills on the floor as soon as it happens.  Remove soap buildup in the tub or shower regularly.  Attach bath mats securely with double-sided non-slip rug tape.  Do not have throw rugs and other things on the floor that can make you trip. What can I do in the  bedroom?  Use night lights.  Make sure that you have a light by your bed that is easy to reach.  Do not use any sheets or blankets that are too big for your bed. They should not hang down onto the floor.  Have a firm chair that has side arms. You can use this for support while you get dressed.  Do not have throw rugs and other things on the floor that can make you trip. What can I do in the kitchen?  Clean up any spills right away.  Avoid walking on wet floors.  Keep items that you use a lot in easy-to-reach places.  If you need to reach something above you, use a strong step stool that has a grab bar.  Keep electrical cords out of the way.  Do not use floor polish or wax that makes floors slippery. If you must use wax, use non-skid floor wax.  Do not have throw rugs and other things on the floor that can make you trip. What can I do with my stairs?  Do not leave any items on the stairs.  Make sure that there are handrails on both sides of the stairs and use them. Fix handrails that are broken or loose. Make sure that handrails are as long as the stairways.  Check any carpeting to make sure that it is firmly attached to   stairs. Fix any carpet that is loose or worn.  Avoid having throw rugs at the top or bottom of the stairs. If you do have throw rugs, attach them to the floor with carpet tape.  Make sure that you have a light switch at the top of the stairs and the bottom of the stairs. If you do not have them, ask someone to add them for you. What else can I do to help prevent falls?  Wear shoes that:  Do not have high heels.  Have rubber bottoms.  Are comfortable and fit you well.  Are closed at the toe. Do not wear sandals.  If you use a stepladder:  Make sure that it is fully opened. Do not climb a closed stepladder.  Make sure that both sides of the stepladder are locked into place.  Ask someone to hold it for you, if possible.  Clearly mark and make  sure that you can see:  Any grab bars or handrails.  First and last steps.  Where the edge of each step is.  Use tools that help you move around (mobility aids) if they are needed. These include:  Canes.  Walkers.  Scooters.  Crutches.  Turn on the lights when you go into a dark area. Replace any light bulbs as soon as they burn out.  Set up your furniture so you have a clear path. Avoid moving your furniture around.  If any of your floors are uneven, fix them.  If there are any pets around you, be aware of where they are.  Review your medicines with your doctor. Some medicines can make you feel dizzy. This can increase your chance of falling. Ask your doctor what other things that you can do to help prevent falls. This information is not intended to replace advice given to you by your health care provider. Make sure you discuss any questions you have with your health care provider. Document Released: 12/12/2008 Document Revised: 07/24/2015 Document Reviewed: 03/22/2014  2017 Elsevier  Health Maintenance, Male A healthy lifestyle and preventative care can promote health and wellness.  Maintain regular health, dental, and eye exams.  Eat a healthy diet. Foods like vegetables, fruits, whole grains, low-fat dairy products, and lean protein foods contain the nutrients you need and are low in calories. Decrease your intake of foods high in solid fats, added sugars, and salt. Get information about a proper diet from your health care provider, if necessary.  Regular physical exercise is one of the most important things you can do for your health. Most adults should get at least 150 minutes of moderate-intensity exercise (any activity that increases your heart rate and causes you to sweat) each week. In addition, most adults need muscle-strengthening exercises on 2 or more days a week.   Maintain a healthy weight. The body mass index (BMI) is a screening tool to identify possible  weight problems. It provides an estimate of body fat based on height and weight. Your health care provider can find your BMI and can help you achieve or maintain a healthy weight. For males 20 years and older:  A BMI below 18.5 is considered underweight.  A BMI of 18.5 to 24.9 is normal.  A BMI of 25 to 29.9 is considered overweight.  A BMI of 30 and above is considered obese.  Maintain normal blood lipids and cholesterol by exercising and minimizing your intake of saturated fat. Eat a balanced diet with plenty of fruits and vegetables. Blood tests  for lipids and cholesterol should begin at age 73 and be repeated every 5 years. If your lipid or cholesterol levels are high, you are over age 72, or you are at high risk for heart disease, you may need your cholesterol levels checked more frequently.Ongoing high lipid and cholesterol levels should be treated with medicines if diet and exercise are not working.  If you smoke, find out from your health care provider how to quit. If you do not use tobacco, do not start.  Lung cancer screening is recommended for adults aged 26-80 years who are at high risk for developing lung cancer because of a history of smoking. A yearly low-dose CT scan of the lungs is recommended for people who have at least a 30-pack-year history of smoking and are current smokers or have quit within the past 15 years. A pack year of smoking is smoking an average of 1 pack of cigarettes a day for 1 year (for example, a 30-pack-year history of smoking could mean smoking 1 pack a day for 30 years or 2 packs a day for 15 years). Yearly screening should continue until the smoker has stopped smoking for at least 15 years. Yearly screening should be stopped for people who develop a health problem that would prevent them from having lung cancer treatment.  If you choose to drink alcohol, do not have more than 2 drinks per day. One drink is considered to be 12 oz (360 mL) of beer, 5 oz (150  mL) of wine, or 1.5 oz (45 mL) of liquor.  Avoid the use of street drugs. Do not share needles with anyone. Ask for help if you need support or instructions about stopping the use of drugs.  High blood pressure causes heart disease and increases the risk of stroke. High blood pressure is more likely to develop in:  People who have blood pressure in the end of the normal range (100-139/85-89 mm Hg).  People who are overweight or obese.  People who are African American.  If you are 37-54 years of age, have your blood pressure checked every 3-5 years. If you are 77 years of age or older, have your blood pressure checked every year. You should have your blood pressure measured twice-once when you are at a hospital or clinic, and once when you are not at a hospital or clinic. Record the average of the two measurements. To check your blood pressure when you are not at a hospital or clinic, you can use:  An automated blood pressure machine at a pharmacy.  A home blood pressure monitor.  If you are 83-67 years old, ask your health care provider if you should take aspirin to prevent heart disease.  Diabetes screening involves taking a blood sample to check your fasting blood sugar level. This should be done once every 3 years after age 4 if you are at a normal weight and without risk factors for diabetes. Testing should be considered at a younger age or be carried out more frequently if you are overweight and have at least 1 risk factor for diabetes.  Colorectal cancer can be detected and often prevented. Most routine colorectal cancer screening begins at the age of 41 and continues through age 74. However, your health care provider may recommend screening at an earlier age if you have risk factors for colon cancer. On a yearly basis, your health care provider may provide home test kits to check for hidden blood in the stool. A small camera  at the end of a tube may be used to directly examine the colon  (sigmoidoscopy or colonoscopy) to detect the earliest forms of colorectal cancer. Talk to your health care provider about this at age 31 when routine screening begins. A direct exam of the colon should be repeated every 5-10 years through age 33, unless early forms of precancerous polyps or small growths are found.  People who are at an increased risk for hepatitis B should be screened for this virus. You are considered at high risk for hepatitis B if:  You were born in a country where hepatitis B occurs often. Talk with your health care provider about which countries are considered high risk.  Your parents were born in a high-risk country and you have not received a shot to protect against hepatitis B (hepatitis B vaccine).  You have HIV or AIDS.  You use needles to inject street drugs.  You live with, or have sex with, someone who has hepatitis B.  You are a man who has sex with other men (MSM).  You get hemodialysis treatment.  You take certain medicines for conditions like cancer, organ transplantation, and autoimmune conditions.  Hepatitis C blood testing is recommended for all people born from [birth year] through 1963 and any individual with known risk factors for hepatitis C.  Healthy men should no longer receive prostate-specific antigen (PSA) blood tests as part of routine cancer screening. Talk to your health care provider about prostate cancer screening.  Testicular cancer screening is not recommended for adolescents or adult males who have no symptoms. Screening includes self-exam, a health care provider exam, and other screening tests. Consult with your health care provider about any symptoms you have or any concerns you have about testicular cancer.  Practice safe sex. Use condoms and avoid high-risk sexual practices to reduce the spread of sexually transmitted infections (STIs).  You should be screened for STIs, including gonorrhea and chlamydia if:  You are sexually active and  are younger than 24 years.  You are older than 24 years, and your health care provider tells you that you are at risk for this type of infection.  Your sexual activity has changed since you were last screened, and you are at an increased risk for chlamydia or gonorrhea. Ask your health care provider if you are at risk.  If you are at risk of being infected with HIV, it is recommended that you take a prescription medicine daily to prevent HIV infection. This is called pre-exposure prophylaxis (PrEP). You are considered at risk if:  You are a man who has sex with other men (MSM).  You are a heterosexual man who is sexually active with multiple partners.  You take drugs by injection.  You are sexually active with a partner who has HIV.  Talk with your health care provider about whether you are at high risk of being infected with HIV. If you choose to begin PrEP, you should first be tested for HIV. You should then be tested every 3 months for as long as you are taking PrEP.  Use sunscreen. Apply sunscreen liberally and repeatedly throughout the day. You should seek shade when your shadow is shorter than you. Protect yourself by wearing long sleeves, pants, a wide-brimmed hat, and sunglasses year round whenever you are outdoors.  Tell your health care provider of new moles or changes in moles, especially if there is a change in shape or color. Also, tell your health care provider if a mole  is larger than the size of a pencil eraser.  A one-time screening for abdominal aortic aneurysm (AAA) and surgical repair of large AAAs by ultrasound is recommended for men aged 67-75 years who are current or former smokers.  Stay current with your vaccines (immunizations). This information is not intended to replace advice given to you by your health care provider. Make sure you discuss any questions you have with your health care provider. Document Released: 08/14/2007 Document Revised: 03/08/2014 Document  Reviewed: 11/19/2014 Elsevier Interactive Patient Education  2017 [employer].

## 2016-03-09 ENCOUNTER — Ambulatory Visit: Payer: Medicare Other | Admitting: Internal Medicine

## 2016-03-10 DIAGNOSIS — L820 Inflamed seborrheic keratosis: Secondary | ICD-10-CM | POA: Diagnosis not present

## 2016-03-11 ENCOUNTER — Ambulatory Visit (INDEPENDENT_AMBULATORY_CARE_PROVIDER_SITE_OTHER): Payer: Medicare Other | Admitting: Internal Medicine

## 2016-03-11 ENCOUNTER — Encounter: Payer: Self-pay | Admitting: Internal Medicine

## 2016-03-11 ENCOUNTER — Other Ambulatory Visit (INDEPENDENT_AMBULATORY_CARE_PROVIDER_SITE_OTHER): Payer: Medicare Other

## 2016-03-11 VITALS — BP 118/76 | HR 68 | Temp 98.4°F | Resp 20 | Wt 165.0 lb

## 2016-03-11 DIAGNOSIS — R739 Hyperglycemia, unspecified: Secondary | ICD-10-CM | POA: Diagnosis not present

## 2016-03-11 DIAGNOSIS — E785 Hyperlipidemia, unspecified: Secondary | ICD-10-CM | POA: Diagnosis not present

## 2016-03-11 DIAGNOSIS — J449 Chronic obstructive pulmonary disease, unspecified: Secondary | ICD-10-CM | POA: Diagnosis not present

## 2016-03-11 DIAGNOSIS — G4700 Insomnia, unspecified: Secondary | ICD-10-CM

## 2016-03-11 LAB — CBC WITH DIFFERENTIAL/PLATELET
Basophils Absolute: 0 10*3/uL (ref 0.0–0.1)
Basophils Relative: 0.5 % (ref 0.0–3.0)
Eosinophils Absolute: 0.1 10*3/uL (ref 0.0–0.7)
Eosinophils Relative: 1.3 % (ref 0.0–5.0)
HCT: 36.4 % — ABNORMAL LOW (ref 39.0–52.0)
Hemoglobin: 12.6 g/dL — ABNORMAL LOW (ref 13.0–17.0)
Lymphocytes Relative: 30.5 % (ref 12.0–46.0)
Lymphs Abs: 2.2 10*3/uL (ref 0.7–4.0)
MCHC: 34.5 g/dL (ref 30.0–36.0)
MCV: 92.8 fl (ref 78.0–100.0)
Monocytes Absolute: 0.7 10*3/uL (ref 0.1–1.0)
Monocytes Relative: 10 % (ref 3.0–12.0)
Neutro Abs: 4.1 10*3/uL (ref 1.4–7.7)
Neutrophils Relative %: 57.7 % (ref 43.0–77.0)
Platelets: 243 10*3/uL (ref 150.0–400.0)
RBC: 3.92 Mil/uL — ABNORMAL LOW (ref 4.22–5.81)
RDW: 13.2 % (ref 11.5–15.5)
WBC: 7.1 10*3/uL (ref 4.0–10.5)

## 2016-03-11 LAB — HEMOGLOBIN A1C: Hgb A1c MFr Bld: 5 % (ref 4.6–6.5)

## 2016-03-11 MED ORDER — DICLOFENAC SODIUM 1 % TD GEL: 4.0000 g | Freq: Four times a day (QID) | TRANSDERMAL | 11 refills | Status: DC | PRN

## 2016-03-11 MED ORDER — ZOLPIDEM TARTRATE 5 MG PO TABS: 5.0000 mg | ORAL_TABLET | Freq: Every evening | ORAL | 5 refills | Status: DC | PRN

## 2016-03-11 NOTE — Assessment & Plan Note (Signed)
stable overall by history and exam, recent data reviewed with pt, and pt to continue medical treatment as before,  to f/u any worsening symptoms or concerns @LASTSAO2(3)@  

## 2016-03-11 NOTE — Patient Instructions (Signed)
Ok to take the Mucinex OTC for the left ear congestion  Please take all new medication as prescribed - the voltaren gel as needed for pain  Please continue all other medications as before, and refills have been done if requested - the ambien  Please have the pharmacy call with any other refills you may need.  Please continue your efforts at being more active, low cholesterol diet, and weight control.  You are otherwise up to date with prevention measures today.  Please keep your appointments with your specialists as you may have planned  Please go to the LAB in the Basement (turn left off the elevator) for the tests to be done today  You will be contacted by phone if any changes need to be made immediately.  Otherwise, you will receive a letter about your results with an explanation, but please check with MyChart first.  Please remember to sign up for MyChart if you have not done so, as this will be important to you in the future with finding out test results, communicating by private email, and scheduling acute appointments online when needed.  Please return in 1 year for your yearly visit, or sooner if needed, with Lab testing done 3-5 days before

## 2016-03-11 NOTE — Progress Notes (Signed)
Pre visit review using our clinic review tool, if applicable. No additional management support is needed unless otherwise documented below in the visit note. 

## 2016-03-11 NOTE — Assessment & Plan Note (Signed)
Mild to mod, for ambien qhs prn,,  to f/u any worsening symptoms or concerns

## 2016-03-11 NOTE — Progress Notes (Signed)
Subjective:    Patient ID: Raymond Logan, male    DOB: 03/05/1930, 83 y.o.   MRN: [id number]  HPI  Here for yearly f/u;  Overall doing ok;  Pt denies Chest pain, worsening SOB, DOE, wheezing, orthopnea, PND, worsening LE edema, palpitations, dizziness or syncope.  Pt denies neurological change such as new headache, facial or extremity weakness.  Pt denies polydipsia, polyuria, or low sugar symptoms. Pt states overall good compliance with treatment and medications, good tolerability, and has been trying to follow appropriate diet.  Pt denies worsening depressive symptoms, suicidal ideation or panic. No fever, night sweats, wt loss, loss of appetite, or other constitutional symptoms.  Pt states good ability with ADL's, has low fall risk, home safety reviewed and adequate, no other significant changes in hearing or vision, and only occasionally active with exercise.No other changes to history, except seeing flexogenics recently with improved left knee pain with injections with synvisc and knee brace, seems better.  No longer playing bball full court, now using the recumbent bike. Also plays ping pong 2 hours every wed. Also having 1 wk left ear fullness, popping and crackling.  Also with persistent insomnia last few wks, asks for Lorrin Mais which has helped in past.  Past Medical History:  Diagnosis Date  . ALLERGIC RHINITIS 01/23/2007  . Arthritis   . BENIGN PROSTATIC HYPERTROPHY 09/10/2006  . Chronic headaches   . COPD, MILD 04/03/2010   patient denies on preop visit of 08/02/2014   . Difficulty in urination   . FATIGUE 01/08/2008  . GERD 04/15/2010  . H. pylori infection    hx of   . Headache(784.0) 09/10/2006  . HOARSENESS 04/15/2010  . HYPERLIPIDEMIA 09/10/2006  . INSOMNIA-SLEEP DISORDER-UNSPEC 04/24/2009  . PEPTIC ULCER DISEASE 01/23/2007   Past Surgical History:  Procedure Laterality Date  . CATARACT EXTRACTION    . HEMORRHOID BANDING    . KNEE ARTHROSCOPY    . LUMBAR LAMINECTOMY/DECOMPRESSION  MICRODISCECTOMY Right 08/07/2014   Procedure: complete DECOMPRESSION LUMBAR LAMINECTOMY/MICRODISCECTOMY OF L4-L5 for spinal stenosis, DECOMPRESSION OF L3-L4;  Surgeon: Latanya Maudlin, MD;  Location: [facility];  Service: Orthopedics;  Laterality: Right;  . SHOULDER ARTHROSCOPY     x3, L & R    reports that he has quit smoking. He has never used smokeless tobacco. He reports that he drinks alcohol. He reports that he does not use drugs. family history includes Alzheimer's disease in his mother and sister; Prostate cancer in his brother. Allergies  Allergen Reactions  . Penicillins Hives       . Alfuzosin Other (See Comments)    BP went crazy, dizzy, passing out   . Aricept [Donepezil Hcl]     Insomnia, headache   Current Outpatient Prescriptions on File Prior to Visit  Medication Sig Dispense Refill  . aspirin EC 325 MG tablet Take 325 mg by mouth daily.    . Cholecalciferol (VITAMIN D3) 2000 UNITS TABS Take 1 tablet by mouth daily.    . Coenzyme Q10 (COQ-10) 100 MG CAPS Take 1 capsule by mouth daily.    . Cranberry (SM CRANBERRY) 300 MG tablet Take 300 mg by mouth daily.     . Cyanocobalamin (B-12) 2500 MCG TABS Take 1 tablet by mouth daily.    [name] donepezil (ARICEPT) 10 MG tablet Take 1 tablet (10 mg total) by mouth at bedtime. To fill mar1, 2016 90 tablet 3  . EVENING PRIMROSE OIL PO Take 1 tablet by mouth daily.    . finasteride (PROSCAR)  5 MG tablet Take 5 mg by mouth daily.    [name] gabapentin (NEURONTIN) 100 MG capsule Take 1 capsule by mouth 2 (two) times daily as needed (headache).   0  . gabapentin (NEURONTIN) 600 MG tablet Take 300-600 mg by mouth daily. Half tablet for the first week. Then increase to whole tab  0  . Glucosamine-Chondroitin (MOVE FREE PO) Take 200 mg by mouth daily.    . magnesium oxide (MAG-OX) 400 MG tablet Take 400 mg by mouth daily.    . methocarbamol (ROBAXIN) 500 MG tablet Take 1 tablet (500 mg total) by mouth every 6 (six) hours as needed for muscle spasms. 40  tablet 1  . Multiple Vitamins-Minerals (MENS MULTIVITAMIN PLUS PO) Take 1 tablet by mouth daily.    . nitrofurantoin, macrocrystal-monohydrate, (MACROBID) 100 MG capsule Take 1 capsule (100 mg total) by mouth at bedtime. 7 capsule 0  . NON FORMULARY Take 1 tablet by mouth daily. Focus factor    . NON FORMULARY Take 1 tablet by mouth daily. Memory complex    . NON FORMULARY Take 1 tablet by mouth daily. blueberry    . Omega-3 Fatty Acids (FISH OIL) 1200 MG CAPS Take 1 capsule by mouth daily.    [name] oxyCODONE-acetaminophen (PERCOCET/ROXICET) 5-325 MG per tablet Take 1-2 tablets by mouth every 4 (four) hours as needed for moderate pain. 60 tablet 0  . Resveratrol 250 MG CAPS Take 1 capsule by mouth daily.    . silodosin (RAPAFLO) 8 MG CAPS capsule Take by mouth.    . traZODone (DESYREL) 50 MG tablet Take 25 mg by mouth at bedtime.     . Turmeric 500 MG CAPS Take 2 tablets by mouth daily.    . bisacodyl (DULCOLAX) 5 MG EC tablet Take 1 tablet (5 mg total) by mouth daily as needed for moderate constipation. (Patient not taking: Reported on 03/11/2016) 30 tablet 0  . polyethylene glycol (MIRALAX / GLYCOLAX) packet Take 17 g by mouth daily as needed for mild constipation. (Patient not taking: Reported on 03/11/2016) 14 each 0   No current facility-administered medications on file prior to visit.    Review of Systems Constitutional: Negative for increased diaphoresis, or other activity, appetite or siginficant weight change other than noted HENT: Negative for worsening hearing loss, ear pain, facial swelling, mouth sores and neck stiffness.   Eyes: Negative for other worsening pain, redness or visual disturbance.  Respiratory: Negative for choking or stridor Cardiovascular: Negative for other chest pain and palpitations.  Gastrointestinal: Negative for worsening diarrhea, blood in stool, or abdominal distention Genitourinary: Negative for hematuria, flank pain or change in urine volume.    Musculoskeletal: Negative for myalgias or other joint complaints.  Skin: Negative for other color change and wound or drainage.  Neurological: Negative for syncope and numbness. other than noted Hematological: Negative for adenopathy. or other swelling Psychiatric/Behavioral: Negative for hallucinations, SI, self-injury, decreased concentration or other worsening agitation.  All other system neg per pt    Objective:   Physical Exam BP 118/76   Pulse 68   Temp 98.4 F (36.9 C) (Oral)   Resp 20   Wt 165 lb (74.8 kg)   SpO2 97%   BMI 24.37 kg/m  VS noted,  Constitutional: Pt is oriented to person, place, and time. Appears well-developed and well-nourished, in no significant distress Head: Normocephalic and atraumatic  Eyes: Conjunctivae and EOM are normal. Pupils are equal, round, and reactive to light Right Ear: External ear normal.  Left  Ear: External ear normal Nose: Nose normal.  Bilat tm's with mild erythema, left with small psot effusion.  Max sinus areas non tender.  Pharynx with no erythema, no exudate Mouth/Throat: Oropharynx is clear and moist  Neck: Normal range of motion. Neck supple. No JVD present. No tracheal deviation present or significant neck LA or mass Cardiovascular: Normal rate, regular rhythm, normal heart sounds and intact distal pulses.   Pulmonary/Chest: Effort normal and breath sounds without rales or wheezing  Abdominal: Soft. Bowel sounds are normal. NT. No HSM  Musculoskeletal: Normal range of motion, but left knee with marked deg bony changes, no effusion,  Exhibits no edema Lymphadenopathy: Has no cervical adenopathy.  Neurological: Pt is alert and oriented to person, place, and time. Pt has normal reflexes. No cranial nerve deficit. Motor grossly intact Skin: Skin is warm and dry. No rash noted or new ulcers Psychiatric:  Has normal mood and affect. Behavior is normal.  No other new exam findings    Assessment & Plan:

## 2016-03-11 NOTE — Assessment & Plan Note (Signed)
stable overall by history and exam, recent data reviewed with pt, and pt to continue medical treatment as before,  to f/u any worsening symptoms or concerns Lab Results  Component Value Date   LDLCALC 107 (H) 09/07/2011

## 2016-03-11 NOTE — Assessment & Plan Note (Signed)
stable overall by history and exam, recent data reviewed with pt, and pt to continue medical treatment as before,  to f/u any worsening symptoms or concerns Lab Results  Component Value Date   WBC 5.9 08/02/2014   HGB 12.7 (L) 08/02/2014   HCT 37.1 (L) 08/02/2014   PLT 237 08/02/2014   GLUCOSE 112 (H) 08/02/2014   CHOL 187 09/07/2011   TRIG 143.0 09/07/2011   HDL 51.80 09/07/2011   LDLDIRECT 134.4 03/27/2010   LDLCALC 107 (H) 09/07/2011   ALT 15 (L) 08/02/2014   AST 21 08/02/2014   NA 141 08/02/2014   K 3.8 08/02/2014   CL 108 08/02/2014   CREATININE 0.86 08/02/2014   BUN 18 08/02/2014   CO2 26 08/02/2014   TSH 0.59 09/07/2011   PSA normal per urology 06/30/2007   INR 1.02 08/02/2014

## 2016-03-12 LAB — BASIC METABOLIC PANEL
BUN: 21 mg/dL (ref 6–23)
CALCIUM: 9.2 mg/dL (ref 8.4–10.5)
CO2: 30 meq/L (ref 19–32)
Chloride: 108 mEq/L (ref 96–112)
Creatinine, Ser: 1.06 mg/dL (ref 0.40–1.50)
GFR: 70.84 mL/min (ref >60.00)
GLUCOSE: 84 mg/dL (ref 70–99)
Potassium: 4.3 mEq/L (ref 3.5–5.1)
SODIUM: 143 meq/L (ref 135–145)

## 2016-03-12 LAB — URINALYSIS, ROUTINE W REFLEX MICROSCOPIC
BILIRUBIN URINE: NEGATIVE
HGB URINE DIPSTICK: NEGATIVE
KETONES UR: NEGATIVE
NITRITE: NEGATIVE
Specific Gravity, Urine: 1.025 (ref 1.000–1.030)
Total Protein, Urine: NEGATIVE
UROBILINOGEN UA: 0.2 (ref 0.0–1.0)
Urine Glucose: NEGATIVE
pH: 5.5 (ref 5.0–8.0)

## 2016-03-12 LAB — HEPATIC FUNCTION PANEL
ALBUMIN: 4 g/dL (ref 3.5–5.2)
ALK PHOS: 83 U/L (ref 39–117)
ALT: 14 U/L (ref 0–53)
AST: 19 U/L (ref 0–37)
BILIRUBIN DIRECT: 0 mg/dL (ref 0.0–0.3)
TOTAL PROTEIN: 6.6 g/dL (ref 6.0–8.3)
Total Bilirubin: 0.3 mg/dL (ref 0.2–1.2)

## 2016-03-12 LAB — LIPID PANEL
Cholesterol: 179 mg/dL (ref 0–200)
HDL: 55.3 mg/dL (ref >39.00)
LDL CALC: 86 mg/dL (ref 0–99)
NONHDL: 123.86
Total CHOL/HDL Ratio: 3
Triglycerides: 188 mg/dL — ABNORMAL HIGH (ref 0.0–149.0)
VLDL: 37.6 mg/dL (ref 0.0–40.0)

## 2016-03-12 LAB — TSH: TSH: 0.77 u[IU]/mL (ref 0.35–4.50)

## 2016-04-15 DIAGNOSIS — G43719 Chronic migraine without aura, intractable, without status migrainosus: Secondary | ICD-10-CM | POA: Diagnosis not present

## 2016-04-27 DIAGNOSIS — M25462 Effusion, left knee: Secondary | ICD-10-CM | POA: Diagnosis not present

## 2016-04-27 DIAGNOSIS — M1712 Unilateral primary osteoarthritis, left knee: Secondary | ICD-10-CM | POA: Diagnosis not present

## 2016-04-27 DIAGNOSIS — M25562 Pain in left knee: Secondary | ICD-10-CM | POA: Diagnosis not present

## 2016-05-18 ENCOUNTER — Emergency Department (HOSPITAL_COMMUNITY): Payer: Medicare Other

## 2016-05-18 ENCOUNTER — Encounter (HOSPITAL_COMMUNITY): Payer: Self-pay | Admitting: Emergency Medicine

## 2016-05-18 ENCOUNTER — Emergency Department (HOSPITAL_COMMUNITY)
Admission: EM | Admit: 2016-05-18 | Discharge: 2016-05-18 | Disposition: A | Discharge status: Home / Self Care | Payer: Medicare Other | Attending: Emergency Medicine | Admitting: Emergency Medicine

## 2016-05-18 ENCOUNTER — Other Ambulatory Visit: Payer: Self-pay

## 2016-05-18 ENCOUNTER — Telehealth: Payer: Self-pay | Admitting: Internal Medicine

## 2016-05-18 DIAGNOSIS — R471 Dysarthria and anarthria: Secondary | ICD-10-CM | POA: Insufficient documentation

## 2016-05-18 DIAGNOSIS — Z87891 Personal history of nicotine dependence: Secondary | ICD-10-CM | POA: Diagnosis not present

## 2016-05-18 DIAGNOSIS — D696 Thrombocytopenia, unspecified: Secondary | ICD-10-CM | POA: Diagnosis not present

## 2016-05-18 DIAGNOSIS — Z7982 Long term (current) use of aspirin: Secondary | ICD-10-CM | POA: Diagnosis not present

## 2016-05-18 DIAGNOSIS — R200 Anesthesia of skin: Secondary | ICD-10-CM | POA: Diagnosis not present

## 2016-05-18 DIAGNOSIS — R4781 Slurred speech: Secondary | ICD-10-CM | POA: Diagnosis not present

## 2016-05-18 DIAGNOSIS — J449 Chronic obstructive pulmonary disease, unspecified: Secondary | ICD-10-CM | POA: Diagnosis not present

## 2016-05-18 DIAGNOSIS — I6789 Other cerebrovascular disease: Secondary | ICD-10-CM | POA: Diagnosis not present

## 2016-05-18 DIAGNOSIS — R519 Headache, unspecified: Secondary | ICD-10-CM

## 2016-05-18 DIAGNOSIS — Z79899 Other long term (current) drug therapy: Secondary | ICD-10-CM | POA: Diagnosis not present

## 2016-05-18 DIAGNOSIS — R51 Headache: Secondary | ICD-10-CM | POA: Insufficient documentation

## 2016-05-18 LAB — COMPREHENSIVE METABOLIC PANEL
ALK PHOS: 60 U/L (ref 38–126)
ALT: 20 U/L (ref 17–63)
AST: 25 U/L (ref 15–41)
Albumin: 3.3 g/dL — ABNORMAL LOW (ref 3.5–5.0)
Anion gap: 8 (ref 5–15)
BUN: 17 mg/dL (ref 6–20)
CALCIUM: 8.7 mg/dL — AB (ref 8.9–10.3)
CO2: 27 mmol/L (ref 22–32)
CREATININE: 1.41 mg/dL — AB (ref 0.61–1.24)
Chloride: 104 mmol/L (ref 101–111)
GFR calc non Af Amer: 44 mL/min — ABNORMAL LOW (ref >60)
GFR, EST AFRICAN AMERICAN: 52 mL/min — AB (ref >60)
Glucose, Bld: 106 mg/dL — ABNORMAL HIGH (ref 65–99)
Potassium: 4 mmol/L (ref 3.5–5.1)
Sodium: 139 mmol/L (ref 135–145)
Total Bilirubin: 0.6 mg/dL (ref 0.3–1.2)
Total Protein: 5.7 g/dL — ABNORMAL LOW (ref 6.5–8.1)

## 2016-05-18 LAB — CBC
HCT: 37.2 % — ABNORMAL LOW (ref 39.0–52.0)
HEMOGLOBIN: 12.4 g/dL — AB (ref 13.0–17.0)
MCH: 31.1 pg (ref 26.0–34.0)
MCHC: 33.3 g/dL (ref 30.0–36.0)
MCV: 93.2 fL (ref 78.0–100.0)
Platelets: 117 10*3/uL — ABNORMAL LOW (ref 150–400)
RBC: 3.99 MIL/uL — AB (ref 4.22–5.81)
RDW: 12.8 % (ref 11.5–15.5)
WBC: 4.1 10*3/uL (ref 4.0–10.5)

## 2016-05-18 LAB — RAPID URINE DRUG SCREEN, HOSP PERFORMED
Amphetamines: NOT DETECTED
BENZODIAZEPINES: NOT DETECTED
Barbiturates: NOT DETECTED
Cocaine: NOT DETECTED
Opiates: NOT DETECTED
Tetrahydrocannabinol: NOT DETECTED

## 2016-05-18 LAB — APTT: aPTT: 37 seconds — ABNORMAL HIGH (ref 24–36)

## 2016-05-18 LAB — PROTIME-INR
INR: 1.17
PROTHROMBIN TIME: 15 s (ref 11.4–15.2)

## 2016-05-18 LAB — I-STAT TROPONIN, ED: Troponin i, poc: 0.01 ng/mL (ref 0.00–0.08)

## 2016-05-18 MED ORDER — SODIUM CHLORIDE 0.9 % IV BOLUS (SEPSIS)
1000.0000 mL | Freq: Once | INTRAVENOUS | Status: AC
  Administered 2016-05-18: 1000 mL via INTRAVENOUS

## 2016-05-18 NOTE — ED Notes (Signed)
Pt still unable to urinate.  Starting fluids.

## 2016-05-18 NOTE — ED Provider Notes (Signed)
[facility] DEPT Provider Note   CSN: [id number] Arrival date & time: 05/18/16  2426     History   Chief Complaint Chief Complaint  Patient presents with  . Headache  . Weakness  . Aphasia    started last night    HPI Raymond Logan is a 83 y.o. male with pertinent past medical history of hyperlipidemia, headaches (on gabapentin) presents to the ED with sudden and maximal in onset 10/10 right-sided headache associated with balance/walking difficulty, speech difficulties and right finger numbness that occurred last night at [facility]. Patient states his 4th and 5th digits of his R hand feel like they are sleep.  Patient states he tried to walk down the stairs of his house and felt off balance, had to hold on to things to walk.  Patient tried to speak to his wife who told him she couldn't understand what he was saying.  Patient states this headache is not similar and much worse than the regular headaches that he gets.  Headache occurred while patient was working and sitting down.  No preceding fall, head injury or trauma.  No associated nausea, vomiting, changes in vision, chest pain, palpitations, diaphoresis, neck pain/rigidity, fevers, unintentional weight loss, syncope, focal weakness, photophobia or phonophobia.  Patient has been otherwise been feeling well. Compliant with medications.  Patient denies ETOH, tobacco or illicit drug use.  Patient is active, exercises 2-3 times a day (cardio) and plays ping pong and basketball.   Currently patient states his headache is improved is a 5/10 still right side.  Patient still reports numbness to right 4th and 5th digits.  Patient went to sleep after headache onset, balance and speech issues have since resolved.   HPI  Past Medical History:  Diagnosis Date  . ALLERGIC RHINITIS 01/23/2007  . Arthritis   . BENIGN PROSTATIC HYPERTROPHY 09/10/2006  . Chronic headaches   . COPD, MILD 04/03/2010   patient denies on preop visit of 08/02/2014   .  Difficulty in urination   . FATIGUE 01/08/2008  . GERD 04/15/2010  . H. pylori infection    hx of   . Headache(784.0) 09/10/2006  . HOARSENESS 04/15/2010  . HYPERLIPIDEMIA 09/10/2006  . INSOMNIA-SLEEP DISORDER-UNSPEC 04/24/2009  . PEPTIC ULCER DISEASE 01/23/2007    Patient Active Problem List   Diagnosis Date Noted  . Hyperglycemia 03/11/2016  . Spinal stenosis, lumbar region, with neurogenic claudication 08/07/2014  . Mild cognitive impairment 04/03/2014  . Back pain 12/01/2010  . Preventative health care 11/30/2010  . GERD 04/15/2010  . HOARSENESS 04/15/2010  . COPD (chronic obstructive pulmonary disease) ([place]) 04/03/2010  . Insomnia 04/24/2009  . FATIGUE 01/08/2008  . UNSPEC HEMORRHOIDS WITHOUT MENTION COMPLICATION 83/41/9622  . ALLERGIC RHINITIS 01/23/2007  . PEPTIC ULCER DISEASE 01/23/2007  . Hyperlipidemia 09/10/2006  . BENIGN PROSTATIC HYPERTROPHY 09/10/2006    Past Surgical History:  Procedure Laterality Date  . CATARACT EXTRACTION    . HEMORRHOID BANDING    . KNEE ARTHROSCOPY    . LUMBAR LAMINECTOMY/DECOMPRESSION MICRODISCECTOMY Right 08/07/2014   Procedure: complete DECOMPRESSION LUMBAR LAMINECTOMY/MICRODISCECTOMY OF L4-L5 for spinal stenosis, DECOMPRESSION OF L3-L4;  Surgeon: Latanya Maudlin, MD;  Location: [facility]S;  Service: Orthopedics;  Laterality: Right;  . SHOULDER ARTHROSCOPY     x3, L & R       Home Medications    Prior to Admission medications   Medication Sig Start Date End Date Taking? Authorizing Provider  aspirin EC 325 MG tablet Take 325 mg by mouth daily.  Historical Provider, MD  bisacodyl (DULCOLAX) 5 MG EC tablet Take 1 tablet (5 mg total) by mouth daily as needed for moderate constipation. Patient not taking: Reported on 03/11/2016 08/09/14   Ardeen Jourdain, PA-C  Cholecalciferol (VITAMIN D3) 2000 UNITS TABS Take 1 tablet by mouth daily.    Historical Provider, MD  Coenzyme Q10 (COQ-10) 100 MG CAPS Take 1 capsule by mouth daily.    Historical  Provider, MD  Cranberry (SM CRANBERRY) 300 MG tablet Take 300 mg by mouth daily.     Historical Provider, MD  Cyanocobalamin (B-12) 2500 MCG TABS Take 1 tablet by mouth daily.    Historical Provider, MD  diclofenac sodium (VOLTAREN) 1 % GEL Apply 4 g topically 4 (four) times daily as needed. 03/11/16   Biagio Borg, MD  donepezil (ARICEPT) 10 MG tablet Take 1 tablet (10 mg total) by mouth at bedtime. To fill mar1, 2016 04/03/14   Biagio Borg, MD  EVENING PRIMROSE OIL PO Take 1 tablet by mouth daily.    Historical Provider, MD  finasteride (PROSCAR) 5 MG tablet Take 5 mg by mouth daily.    Historical Provider, MD  gabapentin (NEURONTIN) 100 MG capsule Take 1 capsule by mouth 2 (two) times daily as needed (headache).  07/30/14   Historical Provider, MD  gabapentin (NEURONTIN) 600 MG tablet Take 300-600 mg by mouth daily. Half tablet for the first week. Then increase to whole tab 07/30/14   Historical Provider, MD  Glucosamine-Chondroitin (MOVE FREE PO) Take 200 mg by mouth daily.    Historical Provider, MD  magnesium oxide (MAG-OX) 400 MG tablet Take 400 mg by mouth daily.    Historical Provider, MD  methocarbamol (ROBAXIN) 500 MG tablet Take 1 tablet (500 mg total) by mouth every 6 (six) hours as needed for muscle spasms. 08/09/14   Amber Constable, PA-C  Multiple Vitamins-Minerals (MENS MULTIVITAMIN PLUS PO) Take 1 tablet by mouth daily.    Historical Provider, MD  nitrofurantoin, macrocrystal-monohydrate, (MACROBID) 100 MG capsule Take 1 capsule (100 mg total) by mouth at bedtime. 08/09/14   Festus Aloe, MD  NON FORMULARY Take 1 tablet by mouth daily. Focus factor    Historical Provider, MD  NON FORMULARY Take 1 tablet by mouth daily. Memory complex    Historical Provider, MD  NON FORMULARY Take 1 tablet by mouth daily. blueberry    Historical Provider, MD  Omega-3 Fatty Acids (FISH OIL) 1200 MG CAPS Take 1 capsule by mouth daily.    Historical Provider, MD  oxyCODONE-acetaminophen  (PERCOCET/ROXICET) 5-325 MG per tablet Take 1-2 tablets by mouth every 4 (four) hours as needed for moderate pain. 08/09/14   Amber Constable, PA-C  polyethylene glycol (MIRALAX / GLYCOLAX) packet Take 17 g by mouth daily as needed for mild constipation. Patient not taking: Reported on 03/11/2016 08/09/14   Ardeen Jourdain, PA-C  Resveratrol 250 MG CAPS Take 1 capsule by mouth daily.    Historical Provider, MD  silodosin (RAPAFLO) 8 MG CAPS capsule Take by mouth.    Historical Provider, MD  traZODone (DESYREL) 50 MG tablet Take 25 mg by mouth at bedtime.  01/09/16 01/08/17  Historical Provider, MD  Turmeric 500 MG CAPS Take 2 tablets by mouth daily.    Historical Provider, MD  zolpidem (AMBIEN) 5 MG tablet Take 1 tablet (5 mg total) by mouth at bedtime as needed for sleep. 03/11/16   Biagio Borg, MD    Family History Family History  Problem Relation Age of Onset  .  Prostate cancer Brother   . Alzheimer's disease Mother   . Alzheimer's disease Sister     Social History Social History  Substance Use Topics  . Smoking status: Former Research scientist (life sciences)  . Smokeless tobacco: Never Used  . Alcohol use Yes     Comment: rare     Allergies   Penicillins; Alfuzosin; and Aricept [donepezil hcl]   Review of Systems Review of Systems  Constitutional: Negative for appetite change, diaphoresis, fever and unexpected weight change.  HENT: Negative for congestion, postnasal drip and sore throat.   Eyes: Negative for photophobia and visual disturbance.  Respiratory: Negative for cough, chest tightness and shortness of breath.   Cardiovascular: Negative for chest pain and palpitations.  Gastrointestinal: Negative for constipation, diarrhea, nausea and vomiting.  Genitourinary: Negative for dysuria and flank pain.  Musculoskeletal: Positive for gait problem. Negative for arthralgias, back pain, neck pain and neck stiffness.  Skin: Negative for color change.  Neurological: Positive for speech difficulty,  numbness and headaches. Negative for dizziness, tremors, seizures, syncope and weakness.     Physical Exam Updated Vital Signs BP (!) 104/53   Pulse 61   Temp 99.9 F (37.7 C)   Resp (!) 22   Ht _0  (1.727 m)   Wt 70.3 kg   SpO2 100%   BMI 23.57 kg/m   Physical Exam  Constitutional: He is oriented to person, place, and time. Vital signs are normal. He appears well-developed and well-nourished. He is cooperative. No distress.  HENT:  Head: Normocephalic and atraumatic.  Right Ear: Tympanic membrane, external ear and ear canal normal.  Left Ear: Tympanic membrane, external ear and ear canal normal.  Nose: Nose normal. No mucosal edema or rhinorrhea.  Mouth/Throat: Uvula is midline, oropharynx is clear and moist and mucous membranes are normal. No trismus in the jaw. No uvula swelling. No oropharyngeal exudate.  Eyes: Conjunctivae, EOM and lids are normal. Pupils are equal, round, and reactive to light. Right eye exhibits no nystagmus. Left eye exhibits no nystagmus.  Neck: Normal range of motion and full passive range of motion without pain. Neck supple. No JVD present. Carotid bruit is not present. No neck rigidity. No tracheal deviation and normal range of motion present. No Brudzinski's sign and no Kernig's sign noted.  Cardiovascular: Normal rate, regular rhythm, S1 normal, S2 normal, normal heart sounds and intact distal pulses.  Exam reveals no friction rub.   No murmur heard. BP 112/54 and RR 69 No JVD with head of bed at 30 degrees. Carotid pulses 2+ bilaterally without bruits. Good S1 and S2. No extra sounds or murmurs. 2+ and symmetric radial, dorsalis pedis and posterior tibial pulses bilaterally.   Capillary refill brisk in upper extremities.  No lower extremity edema.  No crackles on lung exam.  Pulmonary/Chest: Effort normal and breath sounds normal. No respiratory distress. He has no decreased breath sounds. He has no wheezes. He has no rales.  Abdominal: Soft.  Bowel sounds are normal. There is no tenderness.  Musculoskeletal: Normal range of motion. He exhibits no deformity.  Lymphadenopathy:    He has no cervical adenopathy.  Neurological: He is alert and oriented to person, place, and time. He has normal strength. No cranial nerve deficit. He displays a negative Romberg sign. GCS eye subscore is 4. GCS verbal subscore is 5. GCS motor subscore is 6.  Pt is alert and oriented.   Speech and phonation normal.   Thought process coherent.   Strength 5/5 in upper and lower  extremities.   Sensation to light touch intact in upper and lower extremities.  Gait normal.   Negative Romberg. No leg drift.  Intact finger to nose test. CN I not tested CN II full visual fields  CN III, IV, VI PEERL and EOM intact CN V light touch intact in all 3 divisions of trigeminal nerve CN VII facial nerve movements intact, symmetric CN VIII hearing intact to finger rub CN IX, X no uvula deviation, symmetric soft palate rise CN XI 5/5 SCM and trapezius strength  CN XII Tongue midline with symmetric L/R movement  Skin: Skin is warm and dry. Capillary refill takes less than 2 seconds.  Psychiatric: He has a normal mood and affect. His behavior is normal. Judgment and thought content normal.  Nursing note and vitals reviewed.    ED Treatments / Results  Labs (all labs ordered are listed, but only abnormal results are displayed) Labs Reviewed  CBC - Abnormal; Notable for the following:       Result Value   RBC 3.99 (*)    Hemoglobin 12.4 (*)    HCT 37.2 (*)    Platelets 117 (*)    All other components within normal limits  COMPREHENSIVE METABOLIC PANEL - Abnormal; Notable for the following:    Glucose, Bld 106 (*)    Creatinine, Ser 1.41 (*)    Calcium 8.7 (*)    Total Protein 5.7 (*)    Albumin 3.3 (*)    GFR calc non Af Amer 44 (*)    GFR calc Af Amer 52 (*)    All other components within normal limits  APTT - Abnormal; Notable for the following:    aPTT  37 (*)    All other components within normal limits  RAPID URINE DRUG SCREEN, HOSP PERFORMED  PROTIME-INR  I-STAT TROPOININ, ED    EKG  EKG Interpretation  Date/Time:  Tuesday May 18 2016 10:02:57 EDT Ventricular Rate:  71 PR Interval:  170 QRS Duration: 80 QT Interval:  386 QTC Calculation: 419 R Axis:   67 Text Interpretation:  Normal sinus rhythm T wave abnormality Artifact Abnormal ekg Confirmed by Carmin Muskrat  MD ([id number]) on 05/18/2016 11:29:02 AM       Radiology No results found.  Procedures Procedures (including critical care time)  Medications Ordered in ED Medications  sodium chloride 0.9 % bolus 1,000 mL (0 mLs Intravenous Stopped 05/18/16 1458)     Initial Impression / Assessment and Plan / ED Course  I have reviewed the triage vital signs and the nursing notes.  Pertinent labs & imaging results that were available during my care of the patient were reviewed by me and considered in my medical decision making (see chart for details).  Clinical Course as of May 18 1557  Tue May 18, 2016  1319 Sodium: 139 [CG]  1320 Potassium: 4.0 [CG]  1320 Chloride: 104 [CG]  1320 Glucose: (!) 106 [CG]  1320 Creatinine: (!) 1.41 [CG]  1320 EGFR (Non-African Amer.): (!) 44 [CG]  1320 WBC: 4.1 [CG]  1320 Hemoglobin: (!) 12.4 [CG]  1320 Troponin i, poc: 0.01 [CG]  1321 Normal sinus rhythm T wave abnormality Artifact Abnormal ekg EKG 12-Lead [CG]  1430 Called MRI, they are running behind. Aware that we requested MRI almost 2.5 hr ago  [CG]  1430 Repeat neuro exam normal  [CG]    Clinical Course User Index [CG] Kinnie Feil, PA-C   history of present illness concerning for [facility], TIA or ischemic  stroke.  Symptoms started at 6 PM yesterday. No history of EtOH or illicit drug use that would predispose patient to fall or traumatic ICH. Vital signs within normal limits. Initial neurological exam normal, repeat unchanged.  No focal neurological findings, dysarthria,  dysmetria, diplopia on exam. RRR without new murmurs, no carotid bruits.  Patient reports mild headache and right hand paresthesias but does not want anything for HA at this time. Will get lab work and MRI. Patient received 1L IVF for elevated creatinine 1.41, declined migraine cocktail.    Patient has not been consistently taking taking daily aspirin.  Takes gabapentin and Excedrin for chronic headaches.  Pending MRI at shift change.  Patient handed off to oncoming PA.  Anticipate discharge with close neurology f/u (ambulatory referral), encourage daily ASA, gabapentin and Excedrin for headache prn.  Patient is highly active, works out 3-4x weekly at [employer] (CV exercise), is reliable to follow up.  Discussed discharge and outpatient work up vs. admission, patient and family would prefer discharge at this time.  Consider admission if MRI concerning.   Final Clinical Impressions(s) / ED Diagnoses   Final diagnoses:  Bad headache  Dysarthria    New Prescriptions New Prescriptions   No medications on file     Kinnie Feil, PA-C 05/18/16 [place], PA-C 05/18/16 [place], MD 05/19/16 [phone]

## 2016-05-18 NOTE — ED Notes (Signed)
Pt given urinal to urinate in for urine specimen.

## 2016-05-18 NOTE — ED Notes (Signed)
PT did not have to use RR at this time  

## 2016-05-18 NOTE — Telephone Encounter (Signed)
Patient Name: Raymond Logan DOB: [birth year] Initial Comment Caller states that last night around 6p he got a severe headache and had trouble talking and walking. This morning he seems to be walking better and states that the 2 smallest fingers on his right hand are still numb. Nurse Assessment Nurse: Marcelline Deist, RN, Lynda Date/Time (Eastern Time): 05/18/2016 9:03:48 AM Confirm and document reason for call. If symptomatic, describe symptoms. ---Caller states that last night around 6 pm he got a severe, sudden headache and had trouble talking and walking. This morning, he seems to be walking better and states that the 2 smallest fingers on his right hand are still numb. Does the patient have any new or worsening symptoms? ---Yes Will a triage be completed? ---Yes Related visit to physician within the last 2 weeks? ---No Does the PT have any chronic conditions? (i.e. diabetes, asthma, etc.) ---Yes List chronic conditions. ---prostate issues Is this a behavioral health or substance abuse call? ---No Guidelines Guideline Title Affirmed Question Affirmed Notes Neurologic Deficit [1] Numbness (i.e., loss of sensation) of the face, arm / hand, or leg / foot on one side of the body AND [2] sudden onset AND [3] present now Final Disposition User Call EMS 911 Now [place], RN, Lynda Comments Caller states he still has slight headache this am, walking better. He states his wife asked him what he wanted fro dinner last night, and she could not understand anything he said in reply. Disagree/Comply: Comply

## 2016-05-18 NOTE — Discharge Instructions (Addendum)
Your MRI was negative for acute abnormalities. Your bloodwork today did show that your platelet count is lower than it has been in the past. Please follow up with your primary care provider within one week for re-check.   Otherwise, please follow up with your neurologist as soon as possible. Continue taking gabapentin and excedrin for headache as needed.   Please read the attached information on TIA and subarachnoid hemorrhage so you are aware of red flag symptoms to monitor for that would warrant return to ED for further evaluation

## 2016-05-18 NOTE — ED Notes (Signed)
Regular diet tray ordered for patient.

## 2016-05-18 NOTE — ED Triage Notes (Signed)
Pt in from home with c/o sharp R sided HA since last night at 6pm, slurred speech and difficulty walking. Per EMS, HA was severe last night, pt went to bed and woke up with remaining HA, clear speech and steady gait. Pt reporting numbness in R 4th and 5th fingers, MAE's equally. Pt states he takes Gabapentin for HA's. Alert, VSS

## 2016-07-13 DIAGNOSIS — M1712 Unilateral primary osteoarthritis, left knee: Secondary | ICD-10-CM | POA: Diagnosis not present

## 2016-07-16 ENCOUNTER — Other Ambulatory Visit: Payer: Self-pay | Admitting: Urology

## 2016-08-05 NOTE — H&P (Signed)
TOTAL KNEE ADMISSION H&P  Patient is being admitted for left total knee arthroplasty.  Subjective:  Chief Complaint:left knee pain.  HPI: Raymond Logan, 83 y.o. male, has a history of pain and functional disability in the left knee due to arthritis and has failed non-surgical conservative treatments for greater than 12 weeks to includeNSAID's and/or analgesics, corticosteriod injections, viscosupplementation injections, flexibility and strengthening excercises and activity modification.  Onset of symptoms was gradual, starting several years ago with gradually worsening course since that time. The patient noted no past surgery on the left knee(s).  Patient currently rates pain in the left knee(s) at 10 out of 10 with activity. Patient has night pain, worsening of pain with activity and weight bearing, pain that interferes with activities of daily living and pain with passive range of motion.  Patient has evidence of joint space narrowing by imaging studies.   There is no active infection.  Patient Active Problem List   Diagnosis Date Noted  . Hyperglycemia 03/11/2016  . Spinal stenosis, lumbar region, with neurogenic claudication 08/07/2014  . Mild cognitive impairment 04/03/2014  . Back pain 12/01/2010  . Preventative health care 11/30/2010  . GERD 04/15/2010  . HOARSENESS 04/15/2010  . COPD (chronic obstructive pulmonary disease) ([place]) 04/03/2010  . Insomnia 04/24/2009  . FATIGUE 01/08/2008  . UNSPEC HEMORRHOIDS WITHOUT MENTION COMPLICATION 24/10/7351  . ALLERGIC RHINITIS 01/23/2007  . PEPTIC ULCER DISEASE 01/23/2007  . Hyperlipidemia 09/10/2006  . BENIGN PROSTATIC HYPERTROPHY 09/10/2006   Past Medical History:  Diagnosis Date  . ALLERGIC RHINITIS 01/23/2007  . Arthritis   . BENIGN PROSTATIC HYPERTROPHY 09/10/2006  . Chronic headaches   . COPD, MILD 04/03/2010   patient denies on preop visit of 08/02/2014   . Difficulty in urination   . FATIGUE 01/08/2008  . GERD 04/15/2010  . H.  pylori infection    hx of   . Headache(784.0) 09/10/2006  . HOARSENESS 04/15/2010  . HYPERLIPIDEMIA 09/10/2006  . INSOMNIA-SLEEP DISORDER-UNSPEC 04/24/2009  . PEPTIC ULCER DISEASE 01/23/2007    Past Surgical History:  Procedure Laterality Date  . CATARACT EXTRACTION    . HEMORRHOID BANDING    . KNEE ARTHROSCOPY    . LUMBAR LAMINECTOMY/DECOMPRESSION MICRODISCECTOMY Right 08/07/2014   Procedure: complete DECOMPRESSION LUMBAR LAMINECTOMY/MICRODISCECTOMY OF L4-L5 for spinal stenosis, DECOMPRESSION OF L3-L4;  Surgeon: Latanya Maudlin, MD;  Location: [facility];  Service: Orthopedics;  Laterality: Right;  . SHOULDER ARTHROSCOPY     x3, L & R    No prescriptions prior to admission.   Allergies  Allergen Reactions  . Alfuzosin Other (See Comments)    BP went crazy, dizzy, passing out   . Aricept [Donepezil Hcl] Other (See Comments)    Insomnia, headache  . Penicillins Hives         Social History  Substance Use Topics  . Smoking status: Former Research scientist (life sciences)  . Smokeless tobacco: Never Used  . Alcohol use Yes     Comment: rare    Family History  Problem Relation Age of Onset  . Prostate cancer Brother   . Alzheimer's disease Mother   . Alzheimer's disease Sister      Review of Systems  Constitutional: Negative.   HENT: Negative.   Eyes: Negative.   Respiratory: Negative.   Cardiovascular: Negative.   Gastrointestinal: Negative.   Genitourinary:       Urinary retention  Musculoskeletal: Positive for joint pain.  Skin: Negative.   Neurological: Negative.   Endo/Heme/Allergies: Negative.   Psychiatric/Behavioral: Negative.  Objective:  Physical Exam  Constitutional: He is oriented to person, place, and time. He appears well-developed and well-nourished.  HENT:  Head: Normocephalic and atraumatic.  Eyes: Pupils are equal, round, and reactive to light.  Neck: Normal range of motion. Neck supple.  Cardiovascular: Intact distal pulses.   Respiratory: Effort normal.   Musculoskeletal:  Tender along the medial joint line of the right knee range of motion is 5/120 with discomfort medially on full flexion.  Collateral ligaments are stable, no effusion.  Neurovascular intact.  Neurological: He is alert and oriented to person, place, and time.  Skin: Skin is warm and dry.  Psychiatric: He has a normal mood and affect. His behavior is normal. Judgment and thought content normal.    Vital signs in last 24 hours:    Labs:   Estimated body mass index is 23.57 kg/m as calculated from the following:   Height as of 05/18/16: 5\' 8"  (1.727 m).   Weight as of 05/18/16: 70.3 kg (155 lb).   Imaging Review Plain radiographs demonstrate  bilateral AP weightbearing, bilateral Rosenberg, lateral sunrise views of the left knee.  This shows end-stage arthritis medial compartment of the left knee.  Assessment/Plan:  End stage arthritis, left knee   The patient history, physical examination, clinical judgment of the provider and imaging studies are consistent with end stage degenerative joint disease of the left knee(s) and total knee arthroplasty is deemed medically necessary. The treatment options including medical management, injection therapy arthroscopy and arthroplasty were discussed at length. The risks and benefits of total knee arthroplasty were presented and reviewed. The risks due to aseptic loosening, infection, stiffness, patella tracking problems, thromboembolic complications and other imponderables were discussed. The patient acknowledged the explanation, agreed to proceed with the plan and consent was signed. Patient is being admitted for inpatient treatment for surgery, pain control, PT, OT, prophylactic antibiotics, VTE prophylaxis, progressive ambulation and ADL's and discharge planning. The patient is planning to be discharged to be determined.

## 2016-08-11 NOTE — Pre-Procedure Instructions (Signed)
    Raymond Logan  08/11/2016      [facility], [name] [address] Phone: [phone] Fax: [phone]  [facility] [place] AT Portal to [facility]istered [place] [address] Phone: [phone] Fax: [facility] [address] [place] [address] Phone: [phone] Fax: [phone]    Your procedure is scheduled on 08/23/16.  Report to [facility] Admitting at 530 A.M.  Call this number if you have problems the morning of surgery:  [phone]   Remember:  Do not eat food or drink liquids after midnight.  Take these medicines the morning of surgery with A SIP OF WATER --proscar,neurontin   Do not wear jewelry, make-up or nail polish.  Do not wear lotions, powders, or perfumes, or deoderant.  Do not shave 48 hours prior to surgery.  Men may shave face and neck.  Do not bring valuables to the hospital.  [facility] is not responsible for any belongings or valuables.  Contacts, dentures or bridgework may not be worn into surgery.  Leave your suitcase in the car.  After surgery it may be brought to your room.  For patients admitted to the hospital, discharge time will be determined by your treatment team.  Patients discharged the day of surgery will not be allowed to drive home.   Name and phone number of your driver:    Special instructions:  Do not take any aspirin,anti-inflammatories,vitamins,or herbal supplements 5-7 days prior to surgery.  Please read over the following fact sheets that you were given. MRSA Information

## 2016-08-12 ENCOUNTER — Ambulatory Visit (HOSPITAL_COMMUNITY)
Admission: RE | Admit: 2016-08-12 | Discharge: 2016-08-12 | Disposition: A | Discharge status: Home / Self Care | Payer: Medicare Other | Source: Ambulatory Visit | Attending: Anesthesiology | Admitting: Anesthesiology

## 2016-08-12 ENCOUNTER — Encounter (HOSPITAL_COMMUNITY): Payer: Self-pay

## 2016-08-12 ENCOUNTER — Encounter (HOSPITAL_COMMUNITY)
Admission: RE | Admit: 2016-08-12 | Discharge: 2016-08-12 | Disposition: A | Discharge status: Home / Self Care | Payer: Medicare Other | Source: Ambulatory Visit | Attending: Orthopedic Surgery | Admitting: Orthopedic Surgery

## 2016-08-12 ENCOUNTER — Encounter (HOSPITAL_COMMUNITY): Payer: Self-pay | Admitting: Vascular Surgery

## 2016-08-12 DIAGNOSIS — Z01818 Encounter for other preprocedural examination: Secondary | ICD-10-CM | POA: Insufficient documentation

## 2016-08-12 DIAGNOSIS — R918 Other nonspecific abnormal finding of lung field: Secondary | ICD-10-CM | POA: Diagnosis not present

## 2016-08-12 DIAGNOSIS — Z01812 Encounter for preprocedural laboratory examination: Secondary | ICD-10-CM | POA: Diagnosis not present

## 2016-08-12 LAB — URINALYSIS, ROUTINE W REFLEX MICROSCOPIC
Bilirubin Urine: NEGATIVE
GLUCOSE, UA: NEGATIVE mg/dL
KETONES UR: NEGATIVE mg/dL
NITRITE: POSITIVE — AB
PH: 5 (ref 5.0–8.0)
Protein, ur: 100 mg/dL — AB
Specific Gravity, Urine: 1.016 (ref 1.005–1.030)
Squamous Epithelial / LPF: NONE SEEN

## 2016-08-12 LAB — CBC WITH DIFFERENTIAL/PLATELET
Basophils Absolute: 0 10*3/uL (ref 0.0–0.1)
Basophils Relative: 0 %
EOS ABS: 0.1 10*3/uL (ref 0.0–0.7)
EOS PCT: 1 %
HCT: 39.2 % (ref 39.0–52.0)
Hemoglobin: 13.5 g/dL (ref 13.0–17.0)
LYMPHS ABS: 2.2 10*3/uL (ref 0.7–4.0)
Lymphocytes Relative: 35 %
MCH: 31.2 pg (ref 26.0–34.0)
MCHC: 34.4 g/dL (ref 30.0–36.0)
MCV: 90.5 fL (ref 78.0–100.0)
MONO ABS: 0.6 10*3/uL (ref 0.1–1.0)
MONOS PCT: 9 %
Neutro Abs: 3.6 10*3/uL (ref 1.7–7.7)
Neutrophils Relative %: 55 %
PLATELETS: 219 10*3/uL (ref 150–400)
RBC: 4.33 MIL/uL (ref 4.22–5.81)
RDW: 12.5 % (ref 11.5–15.5)
WBC: 6.5 10*3/uL (ref 4.0–10.5)

## 2016-08-12 LAB — SURGICAL PCR SCREEN
MRSA, PCR: NEGATIVE
Staphylococcus aureus: NEGATIVE

## 2016-08-12 LAB — TYPE AND SCREEN
ABO/RH(D): A POS
Antibody Screen: NEGATIVE

## 2016-08-12 LAB — BASIC METABOLIC PANEL
Anion gap: 8 (ref 5–15)
BUN: 14 mg/dL (ref 6–20)
CO2: 23 mmol/L (ref 22–32)
CREATININE: 1.21 mg/dL (ref 0.61–1.24)
Calcium: 9.3 mg/dL (ref 8.9–10.3)
Chloride: 112 mmol/L — ABNORMAL HIGH (ref 101–111)
GFR calc Af Amer: >60 mL/min (ref >60)
GFR calc non Af Amer: 54 mL/min — ABNORMAL LOW (ref >60)
Glucose, Bld: 99 mg/dL (ref 65–99)
Potassium: 4.2 mmol/L (ref 3.5–5.1)
Sodium: 143 mmol/L (ref 135–145)

## 2016-08-12 LAB — APTT: aPTT: 32 seconds (ref 24–36)

## 2016-08-12 LAB — PROTIME-INR
INR: 1.02
PROTHROMBIN TIME: 13.4 s (ref 11.4–15.2)

## 2016-08-12 LAB — ABO/RH: ABO/RH(D): A POS

## 2016-08-12 NOTE — Progress Notes (Signed)
Anesthesia Chart Review: Patient is a 83 year old male scheduled for left TKA Raymond Pear, MD) and cystoscopy with foley catheter insertion Raymond Aloe, MD) on 08/23/16.   History includes former smoker, HLD, BPH, GERD, hoarseness, insomnia, PUD, chronic headaches, COPD, arthritis, joint replacement (not specified), L4-5 laminectomy/microdiscectomy 08/07/14.   PCP is Dr. Cathlean Cower.  Meds include aspirin 325 mg PRN headaches, finasteride, gabapentin, Rapaflo, Ambien.  BP 113/65   Pulse 60   Temp 36.7 C   Resp 20   Ht 5\' 9"  (1.753 m)   Wt 161 lb (73 kg)   SpO2 95%   BMI 23.78 kg/m   CXR 08/12/16: IMPRESSION: Possible 3 cm spiculated right mid lung mass. This needs dedicated evaluation with chest CT. (Dr. Mayer Camel has reviewed report. He will order a chest CT to further evaluate. Patient will be out of town until 08/17/16, so CT will be done following his return.)  MRI Brain 04/30/16: IMPRESSION: 1. Normal brain MRI for age. No acute intracranial process identified. 2. Left sphenoid sinusitis.  Preoperative labs noted. Cr 1.21 (previously 1.41, 1.06 this year). Glucose 99. CBC, PT/PTT WNL.   Chart will be left for follow-up CT scan results. Plans to proceed as scheduled may depend on findings.  George Hugh [facility] Short Stay Center/Anesthesiology Phone [phone] 08/12/2016 3:42 PM

## 2016-08-12 NOTE — Progress Notes (Signed)
Call from radiology regarding reading of CXR. Need to call MD to notify of results STAT. Spoke with Ebony Hail with anesthesia and Radiology notified to call Dr. Damita Dunnings office with results.

## 2016-08-13 ENCOUNTER — Other Ambulatory Visit: Payer: Self-pay | Admitting: Orthopedic Surgery

## 2016-08-13 DIAGNOSIS — R222 Localized swelling, mass and lump, trunk: Secondary | ICD-10-CM

## 2016-08-16 ENCOUNTER — Other Ambulatory Visit: Payer: Self-pay | Admitting: Orthopedic Surgery

## 2016-08-17 ENCOUNTER — Ambulatory Visit
Admission: RE | Admit: 2016-08-17 | Discharge: 2016-08-17 | Disposition: A | Discharge status: Home / Self Care | Payer: Medicare Other | Source: Ambulatory Visit | Attending: Orthopedic Surgery | Admitting: Orthopedic Surgery

## 2016-08-17 ENCOUNTER — Telehealth: Payer: Self-pay | Admitting: Internal Medicine

## 2016-08-17 DIAGNOSIS — R222 Localized swelling, mass and lump, trunk: Secondary | ICD-10-CM

## 2016-08-17 MED ORDER — IOPAMIDOL (ISOVUE-300) INJECTION 61%
75.0000 mL | Freq: Once | INTRAVENOUS | Status: AC | PRN
  Administered 2016-08-17: 75 mL via INTRAVENOUS

## 2016-08-17 NOTE — Telephone Encounter (Signed)
Pt has been informed and made an appt

## 2016-08-17 NOTE — Telephone Encounter (Signed)
The spouse has bipolar illness and has not been a reliable historian at times in the past  Unfortunatlly, we need to insist on evaluation, as this may impact the ability to have the knee surgury

## 2016-08-17 NOTE — Telephone Encounter (Signed)
Spouse called and stated that patient has UTI.  States he is having a knee replacement on the 25th and would like to clear the UTI up before then.  Does not want appt.  Would like to know if Dr. Jenny Reichmann would send an order to the lab?

## 2016-08-18 ENCOUNTER — Encounter: Payer: Self-pay | Admitting: Internal Medicine

## 2016-08-18 ENCOUNTER — Telehealth: Payer: Self-pay | Admitting: Internal Medicine

## 2016-08-18 ENCOUNTER — Ambulatory Visit (INDEPENDENT_AMBULATORY_CARE_PROVIDER_SITE_OTHER): Payer: Medicare Other | Admitting: Internal Medicine

## 2016-08-18 VITALS — BP 94/62 | HR 63 | Ht 69.0 in | Wt 165.0 lb

## 2016-08-18 DIAGNOSIS — R8290 Unspecified abnormal findings in urine: Secondary | ICD-10-CM

## 2016-08-18 DIAGNOSIS — G4700 Insomnia, unspecified: Secondary | ICD-10-CM

## 2016-08-18 DIAGNOSIS — R739 Hyperglycemia, unspecified: Secondary | ICD-10-CM | POA: Diagnosis not present

## 2016-08-18 DIAGNOSIS — R918 Other nonspecific abnormal finding of lung field: Secondary | ICD-10-CM

## 2016-08-18 MED ORDER — ESZOPICLONE 2 MG PO TABS: 2.0000 mg | ORAL_TABLET | Freq: Every evening | ORAL | 1 refills | Status: DC | PRN

## 2016-08-18 MED ORDER — CIPROFLOXACIN HCL 500 MG PO TABS: 500.0000 mg | ORAL_TABLET | Freq: Two times a day (BID) | ORAL | 0 refills | Status: AC

## 2016-08-18 NOTE — Telephone Encounter (Signed)
Will route message to MW to address this PM.

## 2016-08-18 NOTE — Telephone Encounter (Signed)
Patient called requesting appointment with Dr. Melvyn Novas as soon as possible.  The patient had spoken with Dr. Mayer Camel and was told to call our office for an appointment.  I advised the patient that I would have to put a message in to see where we can work the patient in, Dr. Gustavus Bryant first available on schedule is 09/30/2016.

## 2016-08-18 NOTE — Patient Instructions (Signed)
OK to change the ambien to [place] (given in hardcopy)  Please return in the morning after 730AM to the LAB only for the urine testing only  Please take all new medication as prescribed - the cipro after the urine test  Please continue all other medications as before, and refills have been done if requested.  Please have the pharmacy call with any other refills you may need.  Please keep your appointments with your specialists as you may have planned, including the PET scan and the Pulmonary

## 2016-08-18 NOTE — Assessment & Plan Note (Signed)
C/w possible UTI but exam o/w benign, will ask for UA and cx in AM as lab is closed, start empiric cipro asd, and f/u urine cx

## 2016-08-18 NOTE — Assessment & Plan Note (Signed)
stable overall by history and exam, recent data reviewed with pt, and pt to continue medical treatment as before,  to f/u any worsening symptoms or concerns Lab Results  Component Value Date   HGBA1C 5.0 03/11/2016

## 2016-08-18 NOTE — Telephone Encounter (Signed)
Needs a PET before seeing me but we should be able to add him on to 6/28 consult schedule (get tammy davis to open) and get the PET in the meantime or at least get him in the waiting line.

## 2016-08-18 NOTE — Telephone Encounter (Signed)
Spoke with pt. He is aware of MW's response. Pt has been scheduled for consult on 08/26/16 at 2pm. Order has been placed for PET scan. Nothing further was needed.

## 2016-08-18 NOTE — Telephone Encounter (Signed)
lmtcb x1 for pt. 

## 2016-08-18 NOTE — Telephone Encounter (Signed)
Patient returning call - he can be reached at [phone] -pr

## 2016-08-18 NOTE — Progress Notes (Signed)
Subjective:    Patient ID: Raymond Logan, male    DOB: [birth year], 83 y.o.   MRN: [id number]  HPI  Here to f/u after recent preop UA was c/w possible UTI, and directed here for further consideration.  Denies urinary symptoms such as dysuria, frequency, urgency, flank pain, hematuria or n/v, fever, chills. Eager to have this resolved, however, as he also had recent abnormal cxr and CT chest yesterday with spiculated right lung mass, so for PET and pulm f/u soon planned.  Original surgury for left knee TKA cancelled for now until other evaluation.   Also c/o inability to sleep, and insurance no longer pays for [employer]. Pt denies chest pain, increased sob or doe, wheezing, orthopnea, PND, increased LE swelling, palpitations, dizziness or syncope.   Pt denies polydipsia, polyuria  Past Medical History:  Diagnosis Date  . ALLERGIC RHINITIS 01/23/2007  . Arthritis   . BENIGN PROSTATIC HYPERTROPHY 09/10/2006  . Chronic headaches   . COPD, MILD 04/03/2010   patient denies on preop visit of 08/02/2014   . Difficulty in urination   . FATIGUE 01/08/2008  . GERD 04/15/2010  . H. pylori infection    hx of   . Headache(784.0) 09/10/2006  . HOARSENESS 04/15/2010  . HYPERLIPIDEMIA 09/10/2006  . INSOMNIA-SLEEP DISORDER-UNSPEC 04/24/2009  . PEPTIC ULCER DISEASE 01/23/2007   Past Surgical History:  Procedure Laterality Date  . CATARACT EXTRACTION    . HEMORRHOID BANDING    . JOINT REPLACEMENT    . KNEE ARTHROSCOPY    . LUMBAR LAMINECTOMY/DECOMPRESSION MICRODISCECTOMY Right 08/07/2014   Procedure: complete DECOMPRESSION LUMBAR LAMINECTOMY/MICRODISCECTOMY OF L4-L5 for spinal stenosis, DECOMPRESSION OF L3-L4;  Surgeon: Latanya Maudlin, MD;  Location: [facility];  Service: Orthopedics;  Laterality: Right;  . SHOULDER ARTHROSCOPY     x3, L & R    reports that he has quit smoking. He has never used smokeless tobacco. He reports that he drinks alcohol. He reports that he does not use drugs. family history includes  Alzheimer's disease in his mother and sister; Prostate cancer in his brother. Allergies  Allergen Reactions  . Alfuzosin Other (See Comments)    BP went crazy, dizzy, passing out   . Aricept [Donepezil Hcl] Other (See Comments)    Insomnia, headache  . Penicillins Hives        Current Outpatient Prescriptions on File Prior to Visit  Medication Sig Dispense Refill  . aspirin EC 325 MG tablet Take 325 mg by mouth every 6 (six) hours as needed (Only takes if gabapentin does not work for headaches).     . finasteride (PROSCAR) 5 MG tablet Take 5 mg by mouth daily.    [name] gabapentin (NEURONTIN) 100 MG capsule Take 1 capsule by mouth 2 (two) times daily as needed (headache).   0  . gabapentin (NEURONTIN) 600 MG tablet Take 600 mg by mouth 2 (two) times daily as needed.   0  . silodosin (RAPAFLO) 8 MG CAPS capsule Take 8 mg by mouth daily with breakfast.      No current facility-administered medications on file prior to visit.    Review of Systems  Constitutional: Negative for other unusual diaphoresis or sweats HENT: Negative for ear discharge or swelling Eyes: Negative for other worsening visual disturbances Respiratory: Negative for stridor or other swelling  Gastrointestinal: Negative for worsening distension or other blood Genitourinary: Negative for retention or other urinary change Musculoskeletal: Negative for other MSK pain or swelling Skin: Negative for color change or other new  lesions Neurological: Negative for worsening tremors and other numbness  Psychiatric/Behavioral: Negative for worsening agitation or other fatigue All other system neg per pt    Objective:   Physical Exam BP 94/62   Pulse 63   Ht 5\' 9"  (1.753 m)   Wt 165 lb (74.8 kg)   SpO2 98%   BMI 24.37 kg/m  VS noted,  Constitutional: Pt appears in NAD HENT: Head: NCAT.  Right Ear: External ear normal.  Left Ear: External ear normal.  Eyes: . Pupils are equal, round, and reactive to light. Conjunctivae and  EOM are normal Nose: without d/c or deformity Neck: Neck supple. Gross normal ROM Cardiovascular: Normal rate and regular rhythm.   Pulmonary/Chest: Effort normal and breath sounds without rales or wheezing.  Abd:  Soft, NT, ND, + BS, no organomegaly Neurological: Pt is alert. At baseline orientation, motor grossly intact Skin: Skin is warm. No rashes, other new lesions, no LE edema Psychiatric: Pt behavior is normal without agitation  No other exam findings Lab Results  Component Value Date   WBC 6.5 08/12/2016   HGB 13.5 08/12/2016   HCT 39.2 08/12/2016   PLT 219 08/12/2016   GLUCOSE 99 08/12/2016   CHOL 179 03/11/2016   TRIG 188.0 (H) 03/11/2016   HDL 55.30 03/11/2016   LDLDIRECT 134.4 03/27/2010   LDLCALC 86 03/11/2016   ALT 20 05/18/2016   AST 25 05/18/2016   NA 143 08/12/2016   K 4.2 08/12/2016   CL 112 (H) 08/12/2016   CREATININE 1.21 08/12/2016   BUN 14 08/12/2016   CO2 23 08/12/2016   TSH 0.77 03/11/2016   PSA normal per urology 06/30/2007   INR 1.02 08/12/2016   HGBA1C 5.0 03/11/2016   Urinalysis, Routine w reflex microscopic  Order: [id number]  Status:  Final result  Visible to patient:  Yes (MyChart)  Next appt:  08/25/2016 at 02:00 PM in Radiology ([facility] PET)   Ref Range & Units 6d ago  Color, Urine YELLOW YELLOW   APPearance CLEAR TURBID    Specific Gravity, Urine 1.005 - 1.030 1.016   pH 5.0 - 8.0 5.0   Glucose, UA NEGATIVE mg/dL NEGATIVE   Hgb urine dipstick NEGATIVE MODERATE    Bilirubin Urine NEGATIVE NEGATIVE   Ketones, ur NEGATIVE mg/dL NEGATIVE   Protein, ur NEGATIVE mg/dL 100    Nitrite NEGATIVE POSITIVE    Leukocytes, UA NEGATIVE LARGE    RBC / HPF 0 - 5 RBC/hpf TOO NUMEROUS TO COUNT   WBC, UA 0 - 5 WBC/hpf TOO NUMEROUS TO COUNT   Bacteria, UA NONE SEEN MANY    Squamous Epithelial / LPF NONE SEEN NONE SEEN   WBC Clumps  PRESENT   Mucous  PRESENT   Non Squamous Epithelial NONE SEEN 0-5    Resulting Agency  [facility]    Specimen  Collected: 08/12/16 10:17             Assessment & Plan:

## 2016-08-18 NOTE — Assessment & Plan Note (Signed)
Chronic persistent, for change ambien to lunesta 2 mg qhs prn due to insurance coverage

## 2016-08-19 ENCOUNTER — Other Ambulatory Visit (INDEPENDENT_AMBULATORY_CARE_PROVIDER_SITE_OTHER): Payer: Medicare Other

## 2016-08-19 DIAGNOSIS — R8290 Unspecified abnormal findings in urine: Secondary | ICD-10-CM | POA: Diagnosis not present

## 2016-08-19 LAB — URINALYSIS, ROUTINE W REFLEX MICROSCOPIC
Bilirubin Urine: NEGATIVE
Ketones, ur: NEGATIVE
Nitrite: NEGATIVE
Specific Gravity, Urine: <=1.005 — AB (ref 1.000–1.030)
Total Protein, Urine: NEGATIVE
Urine Glucose: NEGATIVE
Urobilinogen, UA: 0.2 (ref 0.0–1.0)
pH: 6 (ref 5.0–8.0)

## 2016-08-20 LAB — URINE CULTURE: ORGANISM ID, BACTERIA: NO GROWTH

## 2016-08-23 ENCOUNTER — Encounter (HOSPITAL_COMMUNITY): Admission: RE | Payer: Self-pay | Source: Ambulatory Visit

## 2016-08-23 ENCOUNTER — Inpatient Hospital Stay (HOSPITAL_COMMUNITY): Admission: RE | Admit: 2016-08-23 | Payer: Medicare Other | Source: Ambulatory Visit | Admitting: Orthopedic Surgery

## 2016-08-23 SURGERY — ARTHROPLASTY, KNEE, TOTAL
Anesthesia: Spinal

## 2016-08-25 ENCOUNTER — Encounter (HOSPITAL_COMMUNITY)
Admission: RE | Admit: 2016-08-25 | Discharge: 2016-08-25 | Disposition: A | Discharge status: Home / Self Care | Payer: Medicare Other | Source: Ambulatory Visit | Attending: Internal Medicine | Admitting: Internal Medicine

## 2016-08-25 DIAGNOSIS — R918 Other nonspecific abnormal finding of lung field: Secondary | ICD-10-CM | POA: Diagnosis not present

## 2016-08-25 LAB — GLUCOSE, CAPILLARY: Glucose-Capillary: 94 mg/dL (ref 65–99)

## 2016-08-25 MED ORDER — FLUDEOXYGLUCOSE F - 18 (FDG) INJECTION
8.3000 | Freq: Once | INTRAVENOUS | Status: AC | PRN
  Administered 2016-08-25: 8.3 via INTRAVENOUS

## 2016-08-26 ENCOUNTER — Ambulatory Visit (INDEPENDENT_AMBULATORY_CARE_PROVIDER_SITE_OTHER): Payer: Medicare Other | Admitting: Internal Medicine

## 2016-08-26 ENCOUNTER — Encounter: Payer: Self-pay | Admitting: Internal Medicine

## 2016-08-26 VITALS — BP 122/72 | HR 54 | Ht 69.0 in | Wt 163.2 lb

## 2016-08-26 DIAGNOSIS — R918 Other nonspecific abnormal finding of lung field: Secondary | ICD-10-CM | POA: Diagnosis not present

## 2016-08-26 DIAGNOSIS — J449 Chronic obstructive pulmonary disease, unspecified: Secondary | ICD-10-CM | POA: Diagnosis not present

## 2016-08-26 NOTE — Progress Notes (Signed)
Subjective:     Patient ID: Raymond Logan, male   DOB: 05/31/1930,    MRN: [id number]  HPI  8 yowm  From [place]/ [place] graduated [employer] in [place],  very active including full court basketball until L knee slowed him down about 2017 and planned L TKR but preop cxr showed lung mass so referred to pulmonary clinic 08/26/2016 by Dr   Mayer Camel for pulmonary eval.    08/26/2016 1st [facility] office visit/ W[place]   Chief Complaint  Patient presents with  . consult    Pt sees Dr. Mayer Camel was scheduled left knee surgery for August 23 2016, had routine pre otp for surgery conducted chest Xray, than sent for CT scan and PET yesterday. Concerned regarding xray results, also thinks t may have UTI. Pt is normally active, breathing is good exercises daily.   Not limited by breathing from desired activities    No obvious day to day or daytime variability or assoc excess/ purulent sputum or mucus plugs or hemoptysis or cp or chest tightness, subjective wheeze or overt sinus or hb symptoms. No unusual exp hx or h/o childhood pna/ asthma or knowledge of premature birth.  Sleeping ok without nocturnal  or early am exacerbation  of respiratory  c/o's or need for noct saba. Also denies any obvious fluctuation of symptoms with weather or environmental changes or other aggravating or alleviating factors except as outlined above   Current Medications, Allergies, Complete Past Medical History, Past Surgical History, Family History, and Social History were reviewed in [employer] record.  ROS  The following are not active complaints unless bolded sore throat, dysphagia, dental problems, itching, sneezing,  nasal congestion or excess/ purulent secretions, ear ache,   fever, chills, sweats, unintended wt loss, classically pleuritic or exertional cp,  orthopnea pnd or leg swelling, presyncope, palpitations, abdominal pain, anorexia, nausea, vomiting, diarrhea  or change in bowel or bladder  habits, change in stools or urine, dysuria,hematuria,  rash, arthralgias, visual complaints, headache, numbness, weakness or ataxia or problems with walking or coordination,  change in mood/affect or memory.            Review of Systems     Objective:   Physical Exam    exceptionally pleasant hoarse wm nad  Wt Readings from Last 3 Encounters:  08/26/16 163 lb 3.2 oz (74 kg)  08/18/16 165 lb (74.8 kg)  08/12/16 161 lb (73 kg)    Vital signs reviewed - Note on arrival 02 sats  97% on RA     HEENT: nl dentition, turbinates bilaterally, and oropharynx. Nl external ear canals without cough reflex   NECK :  without JVD/Nodes/TM/ nl carotid upstrokes bilaterally   LUNGS: no acc muscle use,  Nl contour chest which is clear to A and P bilaterally without cough on insp or exp maneuvers   CV:  RRR  no s3 or murmur or increase in P2, and no edema   ABD:  soft and nontender with nl inspiratory excursion in the supine position. No bruits or organomegaly appreciated, bowel sounds nl  MS:  Nl gait/ ext warm without deformities, calf tenderness, cyanosis or clubbing - wearing L knee brace      SKIN: warm and dry without lesions    NEURO:  alert, approp, nl sensorium with  no motor or cerebellar deficits apparent.     MRI 05/18/16 Left sphenoid sinusitis./ neg mets    I personally reviewed images and agree with radiology  impression as follows:  Chest CT with contrast  08/17/16 Spiculated central RIGHT middle lobe mass 3.0 x 2.3 x 2.9 cm compatible with pulmonary malignancy.   PET 08/25/16  3.5 x 2.1 cm right middle lobe lesion extending along a right middle lobe bronchus is weakly hypermetabolic with SUV max of 3.1. Small hilar lymph node is hypermetabolic with SUV max of 5.8. There is also a small left hilar lymph which is hypermetabolic with SUV max of 4.9. Assessment:

## 2016-08-26 NOTE — Patient Instructions (Signed)
Please see patient coordinator before you leave today  to schedule thoracic surgery evaluation/ Dr Roxan Hockey

## 2016-08-26 NOTE — Assessment & Plan Note (Addendum)
Quit smoking 1978 Spirometry 08/26/2016  FEV1 2.38 (91%)  Ratio 73 with mild curvature  And nothing prior to study   Could easily tolerate RMLobectomy if he proves to be resectable

## 2016-08-29 NOTE — Assessment & Plan Note (Signed)
PET 08/25/16  3.5 x 2.1 cm right middle lobe lesion extending along a right middle lobe bronchus is weakly hypermetabolic with SUV max of 3.1. Small hilar lymph node is hypermetabolic with SUV max of 5.8. There is also a small left hilar lymph which is hypermetabolic with SUV max of 4.9. - 08/26/2016 refer to T surgery   Unusual pattern with the primary less positive than the nodes but concern is that he may already have IIIB dz and is is completely asymptomatic and in need of need surgery if it turns out this is not resectable so will refer to T surgery to expedite the w/u  Discussed in detail all the  indications, usual  risks and alternatives  relative to the benefits with patient who agrees to proceed with w/u as outlined.  Total time devoted to counseling  > 50 % of initial 60 min office visit:  review case with pt/daughter discussion of options/alternatives/ personally creating written customized instructions  in presence of pt  then going over those specific  Instructions directly with the pt including how to use all of the meds but in particular covering each new medication in detail and the difference between the maintenance= "automatic" meds and the prns using an action plan format for the latter (If this problem/symptom => do that organization reading Left to right).  Please see AVS from this visit for a full list of these instructions which I personally wrote for this pt and  are unique to this visit.

## 2016-09-06 ENCOUNTER — Encounter: Payer: Medicare Other | Admitting: Thoracic Surgery (Cardiothoracic Vascular Surgery)

## 2016-09-10 ENCOUNTER — Institutional Professional Consult (permissible substitution) (INDEPENDENT_AMBULATORY_CARE_PROVIDER_SITE_OTHER): Payer: Medicare Other | Admitting: Thoracic Surgery (Cardiothoracic Vascular Surgery)

## 2016-09-10 ENCOUNTER — Encounter: Payer: Self-pay | Admitting: Thoracic Surgery (Cardiothoracic Vascular Surgery)

## 2016-09-10 VITALS — BP 144/77 | HR 56 | Resp 20 | Ht 69.0 in | Wt 155.0 lb

## 2016-09-10 DIAGNOSIS — R918 Other nonspecific abnormal finding of lung field: Secondary | ICD-10-CM

## 2016-09-10 NOTE — Progress Notes (Signed)
PCP is Biagio Borg, MD Referring Provider is Tanda Rockers, MD  Chief Complaint  Patient presents with  . Lung Mass    Surgical eval, Chest CT 08/17/16, PET Scan 08/26/16, Spirometry with Graph 08/26/16    HPI: Raymond Logan is sent for consultation regarding a right middle lobe lesion.  Raymond Logan is a an 83 year old gentleman with a past medical history significant for remote tobacco abuse (quit 40 years ago), benign prostatic hypertrophy, hyperlipidemia, gastroesophageal reflux and arthritis. He recently was being evaluated for a total knee replacement by Dr. Mayer Camel. As part of his preop evaluation he had a chest x-ray which showed a right lung nodule. A CT of the chest was done. It showed a 3.0 x 2.3 x 2.9 cm spiculated nodule in the right middle lobe. There was no hilar or mediastinal adenopathy. A PET CT showed the mass was mildly hypermetabolic with an SUV of 3.1. There was hypermetabolic activity in both hilar regions, 5.8 on the right and 4.9 on the left, associated with small lymph nodes. There was no mediastinal activity and no evidence of distant metastases.  Raymond Logan remains very active. He was playing full court basketball up until his knee problems became too severe to continue. He has no history of cardiac disease. He denies any chest pain, pressure, or tightness at rest or with exertion. He does have prostate enlargement and a history of difficult catheterization. He has chronic migraine headaches, feels those studies off recently. He does take Tums about once a day for indigestion. Zubrod Score: At the time of surgery this patient's most appropriate activity status/level should be described as: [x]     0    Normal activity, no symptoms []     1    Restricted in physical strenuous activity but ambulatory, able to do out light work []     2    Ambulatory and capable of self care, unable to do work activities, up and about >50 % of waking hours                              []     3     Only limited self care, in bed greater than 50% of waking hours []     4    Completely disabled, no self care, confined to bed or chair []     5    Moribund   Past Medical History:  Diagnosis Date  . ALLERGIC RHINITIS 01/23/2007  . Arthritis   . BENIGN PROSTATIC HYPERTROPHY 09/10/2006  . Chronic headaches   . COPD, MILD 04/03/2010   patient denies on preop visit of 08/02/2014   . Difficulty in urination   . FATIGUE 01/08/2008  . GERD 04/15/2010  . H. pylori infection    hx of   . Headache(784.0) 09/10/2006  . HOARSENESS 04/15/2010  . HYPERLIPIDEMIA 09/10/2006  . INSOMNIA-SLEEP DISORDER-UNSPEC 04/24/2009  . PEPTIC ULCER DISEASE 01/23/2007    Past Surgical History:  Procedure Laterality Date  . CATARACT EXTRACTION    . HEMORRHOID BANDING    . JOINT REPLACEMENT    . KNEE ARTHROSCOPY    . LUMBAR LAMINECTOMY/DECOMPRESSION MICRODISCECTOMY Right 08/07/2014   Procedure: complete DECOMPRESSION LUMBAR LAMINECTOMY/MICRODISCECTOMY OF L4-L5 for spinal stenosis, DECOMPRESSION OF L3-L4;  Surgeon: Latanya Maudlin, MD;  Location: [facility];  Service: Orthopedics;  Laterality: Right;  . SHOULDER ARTHROSCOPY     x3, L & R    Family History  Problem  Relation Age of Onset  . Prostate cancer Brother   . Alzheimer's disease Mother   . Alzheimer's disease Sister     Social History Social History  Substance Use Topics  . Smoking status: Former Smoker    Packs/day: 1.00    Years: 23.00    Quit date: 76  . Smokeless tobacco: Never Used  . Alcohol use No     Comment: rare    Current Outpatient Prescriptions  Medication Sig Dispense Refill  . aspirin EC 325 MG tablet Take 325 mg by mouth every 6 (six) hours as needed (Only takes if gabapentin does not work for headaches).     . eszopiclone (LUNESTA) 2 MG TABS tablet Take 1 tablet (2 mg total) by mouth at bedtime as needed for sleep. Take immediately before bedtime 90 tablet 1  . finasteride (PROSCAR) 5 MG tablet Take 5 mg by mouth daily.    [name]  gabapentin (NEURONTIN) 100 MG capsule Take 1 capsule by mouth 2 (two) times daily as needed (headache).   0  . gabapentin (NEURONTIN) 600 MG tablet Take 600 mg by mouth 2 (two) times daily as needed.   0  . silodosin (RAPAFLO) 8 MG CAPS capsule Take 8 mg by mouth daily with breakfast.      No current facility-administered medications for this visit.     Allergies  Allergen Reactions  . Alfuzosin Other (See Comments)    BP went crazy, dizzy, passing out   . Aricept [Donepezil Hcl] Other (See Comments)    Insomnia, headache  . Penicillins Hives         Review of Systems  Constitutional: Negative for activity change, chills, fatigue, fever and unexpected weight change.  HENT: Negative for trouble swallowing and voice change.   Respiratory: Negative for cough, chest tightness, shortness of breath and wheezing.   Cardiovascular: Negative for chest pain and leg swelling.  Gastrointestinal: Positive for abdominal pain (Heartburn). Negative for blood in stool.  Genitourinary: Positive for frequency and urgency.       Enlarged prostate, difficult to catheterize, followed by [facility]  Musculoskeletal: Positive for arthralgias and joint swelling.  Neurological: Positive for headaches. Negative for dizziness, syncope and weakness.       Episode of dysarthria associated with severe migraine in March 2018  Hematological: Negative for adenopathy. Does not bruise/bleed easily.  All other systems reviewed and are negative.   BP (!) 144/77   Pulse (!) 56   Resp 20   Ht 5\' 9"  (1.753 m)   Wt 155 lb (70.3 kg)   SpO2 97% Comment: RA  BMI 22.89 kg/m  Physical Exam  Constitutional: He is oriented to person, place, and time. He appears well-developed and well-nourished. No distress.  HENT:  Head: Normocephalic and atraumatic.  Mouth/Throat: No oropharyngeal exudate.  Eyes: Conjunctivae and EOM are normal. No scleral icterus.  Neck: No thyromegaly present.  Cardiovascular: Normal rate,  regular rhythm and normal heart sounds.  Exam reveals no gallop and no friction rub.   No murmur heard. Pulmonary/Chest: No respiratory distress. He has no wheezes. He has no rales.  Abdominal: Soft. Bowel sounds are normal. He exhibits no distension. There is no tenderness.  Musculoskeletal: He exhibits deformity (Swelling left knee). He exhibits no edema.  Lymphadenopathy:    He has no cervical adenopathy.  Neurological: He is alert and oriented to person, place, and time. No cranial nerve deficit. He exhibits normal muscle tone.  No motor deficit  Skin: Skin is  warm and dry.  Vitals reviewed.    Diagnostic Tests: CT CHEST WITH CONTRAST  TECHNIQUE: Multidetector CT imaging of the chest was performed during intravenous contrast administration. Sagittal and coronal MPR images reconstructed from axial data set.  CONTRAST:  91mL ISOVUE-300 IOPAMIDOL (ISOVUE-300) INJECTION 61% IV  COMPARISON:  None; correlation chest radiograph 08/12/2016  FINDINGS: Cardiovascular: Atherosclerotic calcifications aorta. Aorta normal caliber without aneurysmal dilatation. No pericardial effusion. Pulmonary arteries grossly patent on nondedicated exam. Azygosvein noted within an azygous fissure.  Mediastinum/Nodes: Esophagus unremarkable. Nonspecific 5 mm RIGHT thyroid nodule. Base of cervical region otherwise normal appearance. Scattered normal sized mediastinal and hilar lymph nodes.  Lungs/Pleura: Spiculated mass centrally in RIGHT middle lobe 3.0 x 2.3 x 2.9 cm compatible with pulmonary malignancy. Scattered interstitial thickening in the periphery of the RIGHT lower lobe inferiorly. Remaining lungs clear. No pulmonary infiltrate, pleural effusion or pneumothorax. No additional mass/ nodule.  Upper Abdomen: Mild cortical scarring at upper pole LEFT kidney. Remaining visualized upper abdomen unremarkable.  Musculoskeletal: No acute osseous findings.  IMPRESSION: Spiculated  central RIGHT middle lobe mass 3.0 x 2.3 x 2.9 cm compatible with pulmonary malignancy.  No definite thoracic adenopathy or pulmonary metastases identified.  Nonspecific interstitial thickening in the periphery of the inferior RIGHT lower lobe.  Aortic Atherosclerosis (ICD10-I70.0).   Electronically Signed   By: Lavonia Dana M.D.   On: 08/17/2016 13:20 NUCLEAR MEDICINE PET SKULL BASE TO THIGH  TECHNIQUE: 8.29 mCi F-18 FDG was injected intravenously. Full-ring PET imaging was performed from the skull base to thigh after the radiotracer. CT data was obtained and used for attenuation correction and anatomic localization.  FASTING BLOOD GLUCOSE:  Value: 94 mg/dl  COMPARISON:  Chest CT 08/17/2016  FINDINGS: NECK  No hypermetabolic lymph nodes in the neck.  CHEST  3.5 x 2.1 cm right middle lobe lesion extending along a right middle lobe bronchus is weakly hypermetabolic with SUV max of 3.1. Small hilar lymph node is hypermetabolic with SUV max of 5.8. There is also a small left hilar lymph which is hypermetabolic with SUV max of 4.9.  No other lung lesions are identified. No other areas of hypermetabolism. No pleural effusion.  ABDOMEN/PELVIS  No abnormal hypermetabolic activity within the liver, pancreas, adrenal glands, or spleen. No hypermetabolic lymph nodes in the abdomen or pelvis. New line additional CT findings include bilateral renal cysts. Probable chronic left UPJ obstruction, advanced atherosclerotic calcifications involving the aorta iliac arteries with a remote calcified distal aortic dissection. Markedly enlarged prostate gland is noted with median lobe hypertrophy impressing on the base of the bladder.  SKELETON  No focal hypermetabolic activity to suggest skeletal metastasis.  IMPRESSION: 1. Elongated right middle lobe lung lesion is weakly hypermetabolic but still suspicious for neoplasm. It is sub solid appearance may suggest  adenocarcinoma with FDG positive bilateral hilar lymph nodes. This lesion should be amenable to bronchoscopic biopsy as it is surrounding a right middle lobe bronchus. 2. No findings for metastatic disease involving the abdomen/pelvis, neck or supraclavicular regions. No findings for osseous metastatic disease.   Electronically Signed   By: Marijo Sanes M.D.   On: 08/25/2016 16:24 I personally reviewed the CT and PET/CT and concur with the findings noted above.  Impression: Raymond Logan is an 83 year old gentleman who was found to have a right middle lobe nodule on preoperative evaluation prior to knee replacement. He has a remote history of tobacco abuse less than 20 pack years and quit over 40 years ago. The nodule is  sub-solid , spiculated, and mildly hypermetabolic on CT. This would most likely be a low-grade adenocarcinoma, although infectious or inflammatory etiologies are also within the differential diagnosis.  It is hard to know what to make of the bilateral hilar activity on PET/CT. There are no pathologically enlarged lymph nodes to account for that. It's fairly symmetrical. Contralateral hilar metastasis without mediastinal involvement would be unusual. I do think we should at least investigate that before we proceed with surgical resection, but I doubt it represents metastatic disease.  I recommended to Raymond Logan that we do an avid patient will bronchoscopy and endobronchial ultrasound to evaluate with the primary lesion in the middle lobe as well as the hilar nodes. Reason for this is that at the left hilar nodes were involved, surgery would not be beneficial for him. I described the procedure to Raymond Logan and his 2 daughters who accompanied him. This will be done in the operating room under general anesthesia on an outpatient basis. There is risk associated with procedures under general anesthesia with rare incidences of MI, stroke, blood clots, or even death. I think  those are all very unlikely in this case given his overall general good health. There is a risk of bleeding or pneumothorax with the biopsies and there also is a risk of failure to make a definitive diagnosis.  He does have a history of urinary retention and is difficult to catheterize. He is followed by [facility]. If he has issues with urinary retention we will consult them postoperatively.  One of his daughters who accompanied him was very upset about the possibility of him having a procedure done at [facility] and would like him to go to [facility]. She requested that his records be sent to do it did not have a physician's name. We will be happy to send his records if Mr. Melroy would like [place] to.   Plan: Navigational bronchoscopy and endobronchial ultrasound on Friday, 09/24/2016  Raymond Nakayama, MD [facility] [phone]

## 2016-09-13 ENCOUNTER — Other Ambulatory Visit: Payer: Self-pay | Admitting: *Deleted

## 2016-09-13 DIAGNOSIS — R911 Solitary pulmonary nodule: Secondary | ICD-10-CM

## 2016-09-16 NOTE — Pre-Procedure Instructions (Signed)
Raymond Logan  09/16/2016      [facility], [place] [address] Phone: [phone] Fax: [phone]  [facility], [place] [place] [address] [place] [address] Phone: [phone] Fax: [place] [phone], [place] [address] Phone: [phone] Fax: [phone]  [facility] [place], [place] - [address] [place] [address] Phone: [phone] Fax: [phone]    Your procedure is scheduled on Friday July 27.  Report to [facility] Admitting at 12:00 P.M.  Call this number if you have problems the morning of surgery:  [phone]   Remember:  Do not eat food or drink liquids after midnight.  Take these medicines the morning of surgery with A SIP OF WATER: gabapentin (neurontin) if needed  7 days prior to surgery STOP taking any Aspirin, diclofenac (Voltaren), Excedrin Migraine, Aleve, Naproxen, Ibuprofen, Motrin, Advil, Goody's, BC's, all herbal medications, fish oil, and all vitamins    Do not wear jewelry, make-up or nail polish.  Do not wear lotions, powders, or perfumes, or deoderant.   Men may shave face and neck.  Do not bring valuables to the hospital.  [facility] is not responsible for any belongings or valuables.  Contacts, dentures or bridgework may not be worn into surgery.  Leave your suitcase in the car.  After surgery it may be brought to your room.  For patients admitted to the hospital, discharge time will be determined by your treatment team.  Patients discharged the day of surgery will not be allowed to drive home.   Special instructions:    [facility]- Preparing For Surgery  Before surgery, you can play  an important role. Because skin is not sterile, your skin needs to be as free of germs as possible. You can reduce the number of germs on your skin by washing with CHG (chlorahexidine gluconate) Soap before surgery.  CHG is an antiseptic cleaner which kills germs and bonds with the skin to continue killing germs even after washing.  Please do not use if you have an allergy to CHG or antibacterial soaps. If your skin becomes reddened/irritated stop using the CHG.  Do not shave (including legs and underarms) for at least 48 hours prior to first CHG shower. It is OK to shave your face.  Please follow these instructions carefully.   1. Shower the NIGHT BEFORE SURGERY and the MORNING OF SURGERY with CHG.   2. If you chose to wash your hair, wash your hair first as usual with your normal shampoo.  3. After you shampoo, rinse your hair and body thoroughly to remove the shampoo.  4. Use CHG as you would any other liquid soap. You can apply CHG directly to the skin and wash gently with a scrungie or a clean washcloth.   5. Apply the CHG Soap to your body ONLY FROM THE NECK DOWN.  Do not use on open wounds or open sores. Avoid contact with your eyes, ears, mouth and genitals (private parts). Wash genitals (private parts) with your normal soap.  6. Wash thoroughly, paying special attention to the area where your surgery will be performed.  7. Thoroughly rinse your  body with warm water from the neck down.  8. DO NOT shower/wash with your normal soap after using and rinsing off the CHG Soap.  9. Pat yourself dry with a CLEAN TOWEL.   10. Wear CLEAN PAJAMAS   11. Place CLEAN SHEETS on your bed the night of your first shower and DO NOT SLEEP WITH PETS.    Day of Surgery: Do not apply any deodorants/lotions. Please wear clean clothes to the hospital/surgery center.

## 2016-09-17 ENCOUNTER — Encounter (HOSPITAL_COMMUNITY): Payer: Self-pay

## 2016-09-17 ENCOUNTER — Encounter (HOSPITAL_COMMUNITY)
Admission: RE | Admit: 2016-09-17 | Discharge: 2016-09-17 | Disposition: A | Discharge status: Home / Self Care | Payer: Medicare Other | Source: Ambulatory Visit | Attending: Thoracic Surgery (Cardiothoracic Vascular Surgery) | Admitting: Thoracic Surgery (Cardiothoracic Vascular Surgery)

## 2016-09-17 DIAGNOSIS — R911 Solitary pulmonary nodule: Secondary | ICD-10-CM | POA: Diagnosis not present

## 2016-09-17 LAB — COMPREHENSIVE METABOLIC PANEL
ALT: 15 U/L — AB (ref 17–63)
AST: 25 U/L (ref 15–41)
Albumin: 3.9 g/dL (ref 3.5–5.0)
Alkaline Phosphatase: 63 U/L (ref 38–126)
Anion gap: 8 (ref 5–15)
BILIRUBIN TOTAL: 0.7 mg/dL (ref 0.3–1.2)
BUN: 14 mg/dL (ref 6–20)
CO2: 26 mmol/L (ref 22–32)
Calcium: 9.1 mg/dL (ref 8.9–10.3)
Chloride: 108 mmol/L (ref 101–111)
Creatinine, Ser: 1.17 mg/dL (ref 0.61–1.24)
GFR calc Af Amer: >60 mL/min (ref >60)
GFR calc non Af Amer: 56 mL/min — ABNORMAL LOW (ref >60)
GLUCOSE: 130 mg/dL — AB (ref 65–99)
POTASSIUM: 3.8 mmol/L (ref 3.5–5.1)
Sodium: 142 mmol/L (ref 135–145)
Total Protein: 6.4 g/dL — ABNORMAL LOW (ref 6.5–8.1)

## 2016-09-17 LAB — CBC
HEMATOCRIT: 39.8 % (ref 39.0–52.0)
Hemoglobin: 13.7 g/dL (ref 13.0–17.0)
MCH: 30.6 pg (ref 26.0–34.0)
MCHC: 34.4 g/dL (ref 30.0–36.0)
MCV: 88.8 fL (ref 78.0–100.0)
Platelets: 201 10*3/uL (ref 150–400)
RBC: 4.48 MIL/uL (ref 4.22–5.81)
RDW: 12.6 % (ref 11.5–15.5)
WBC: 5 10*3/uL (ref 4.0–10.5)

## 2016-09-17 LAB — PROTIME-INR
INR: 1.01
Prothrombin Time: 13.3 seconds (ref 11.4–15.2)

## 2016-09-17 LAB — APTT: APTT: 31 s (ref 24–36)

## 2016-09-17 NOTE — Progress Notes (Signed)
Patient was just here for pre op for knee surgery.  Having his lung worked on first.   No real changes in histories. PCP is Dr. Cathlean Cower  LOV 08/2016 Denies any cardiac issues.  He did state that "yrs ago, I had some heart tests done, but all were normal." No stroke, no murmur.

## 2016-09-23 ENCOUNTER — Ambulatory Visit (HOSPITAL_COMMUNITY): Payer: Medicare Other | Admitting: Registered Nurse

## 2016-09-23 ENCOUNTER — Ambulatory Visit (HOSPITAL_COMMUNITY): Payer: Medicare Other

## 2016-09-23 ENCOUNTER — Ambulatory Visit (HOSPITAL_COMMUNITY)
Admission: RE | Admit: 2016-09-23 | Discharge: 2016-09-23 | Disposition: A | Discharge status: Home / Self Care | Payer: Medicare Other | Source: Ambulatory Visit | Attending: Thoracic Surgery (Cardiothoracic Vascular Surgery) | Admitting: Thoracic Surgery (Cardiothoracic Vascular Surgery)

## 2016-09-23 ENCOUNTER — Encounter (HOSPITAL_COMMUNITY)
Admission: RE | Disposition: A | Discharge status: Home / Self Care | Payer: Self-pay | Source: Ambulatory Visit | Attending: Thoracic Surgery (Cardiothoracic Vascular Surgery)

## 2016-09-23 ENCOUNTER — Encounter (HOSPITAL_COMMUNITY): Payer: Self-pay | Admitting: *Deleted

## 2016-09-23 DIAGNOSIS — Z8711 Personal history of peptic ulcer disease: Secondary | ICD-10-CM | POA: Diagnosis not present

## 2016-09-23 DIAGNOSIS — J449 Chronic obstructive pulmonary disease, unspecified: Secondary | ICD-10-CM | POA: Insufficient documentation

## 2016-09-23 DIAGNOSIS — R911 Solitary pulmonary nodule: Secondary | ICD-10-CM

## 2016-09-23 DIAGNOSIS — Z79899 Other long term (current) drug therapy: Secondary | ICD-10-CM | POA: Diagnosis not present

## 2016-09-23 DIAGNOSIS — Z880 Allergy status to penicillin: Secondary | ICD-10-CM | POA: Insufficient documentation

## 2016-09-23 DIAGNOSIS — Z7982 Long term (current) use of aspirin: Secondary | ICD-10-CM | POA: Insufficient documentation

## 2016-09-23 DIAGNOSIS — I700 Atherosclerosis of aorta: Secondary | ICD-10-CM | POA: Diagnosis not present

## 2016-09-23 DIAGNOSIS — M48061 Spinal stenosis, lumbar region without neurogenic claudication: Secondary | ICD-10-CM | POA: Insufficient documentation

## 2016-09-23 DIAGNOSIS — E785 Hyperlipidemia, unspecified: Secondary | ICD-10-CM | POA: Insufficient documentation

## 2016-09-23 DIAGNOSIS — R739 Hyperglycemia, unspecified: Secondary | ICD-10-CM | POA: Diagnosis not present

## 2016-09-23 DIAGNOSIS — C342 Malignant neoplasm of middle lobe, bronchus or lung: Secondary | ICD-10-CM | POA: Diagnosis not present

## 2016-09-23 DIAGNOSIS — Z87891 Personal history of nicotine dependence: Secondary | ICD-10-CM | POA: Diagnosis not present

## 2016-09-23 DIAGNOSIS — N401 Enlarged prostate with lower urinary tract symptoms: Secondary | ICD-10-CM | POA: Diagnosis not present

## 2016-09-23 DIAGNOSIS — Z01818 Encounter for other preprocedural examination: Secondary | ICD-10-CM | POA: Diagnosis not present

## 2016-09-23 DIAGNOSIS — R846 Abnormal cytological findings in specimens from respiratory organs and thorax: Secondary | ICD-10-CM | POA: Diagnosis not present

## 2016-09-23 DIAGNOSIS — K219 Gastro-esophageal reflux disease without esophagitis: Secondary | ICD-10-CM | POA: Insufficient documentation

## 2016-09-23 DIAGNOSIS — Z419 Encounter for procedure for purposes other than remedying health state, unspecified: Secondary | ICD-10-CM

## 2016-09-23 DIAGNOSIS — R918 Other nonspecific abnormal finding of lung field: Secondary | ICD-10-CM | POA: Diagnosis not present

## 2016-09-23 HISTORY — PX: VIDEO BRONCHOSCOPY WITH ENDOBRONCHIAL ULTRASOUND: SHX6177

## 2016-09-23 HISTORY — PX: VIDEO BRONCHOSCOPY WITH ENDOBRONCHIAL NAVIGATION: SHX6175

## 2016-09-23 SURGERY — VIDEO BRONCHOSCOPY WITH ENDOBRONCHIAL NAVIGATION
Anesthesia: General

## 2016-09-23 MED ORDER — MEPERIDINE HCL 25 MG/ML IJ SOLN: 6.2500 mg | INTRAMUSCULAR | Status: DC | PRN

## 2016-09-23 MED ORDER — SODIUM CHLORIDE 0.9% FLUSH: 3.0000 mL | INTRAVENOUS | Status: DC | PRN

## 2016-09-23 MED ORDER — EPHEDRINE SULFATE 50 MG/ML IJ SOLN
INTRAMUSCULAR | Status: DC | PRN
  Administered 2016-09-23: 10 mg via INTRAVENOUS
  Administered 2016-09-23 (×2): 5 mg via INTRAVENOUS
  Administered 2016-09-23: 10 mg via INTRAVENOUS

## 2016-09-23 MED ORDER — SUGAMMADEX SODIUM 200 MG/2ML IV SOLN
INTRAVENOUS | Status: DC | PRN
  Administered 2016-09-23: 150 mg via INTRAVENOUS

## 2016-09-23 MED ORDER — PROPOFOL 10 MG/ML IV BOLUS
INTRAVENOUS | Status: AC
  Filled 2016-09-23: qty 20

## 2016-09-23 MED ORDER — EPHEDRINE 5 MG/ML INJ
INTRAVENOUS | Status: AC
  Filled 2016-09-23: qty 10

## 2016-09-23 MED ORDER — EPINEPHRINE PF 1 MG/ML IJ SOLN
INTRAMUSCULAR | Status: AC
  Filled 2016-09-23: qty 1

## 2016-09-23 MED ORDER — ACETAMINOPHEN 650 MG RE SUPP: 650.0000 mg | RECTAL | Status: DC | PRN

## 2016-09-23 MED ORDER — FENTANYL CITRATE (PF) 250 MCG/5ML IJ SOLN
INTRAMUSCULAR | Status: AC
  Filled 2016-09-23: qty 5

## 2016-09-23 MED ORDER — SODIUM CHLORIDE 0.9% FLUSH: 3.0000 mL | Freq: Two times a day (BID) | INTRAVENOUS | Status: DC

## 2016-09-23 MED ORDER — SODIUM CHLORIDE 0.9 % IV SOLN: 250.0000 mL | INTRAVENOUS | Status: DC | PRN

## 2016-09-23 MED ORDER — LIDOCAINE HCL (CARDIAC) 20 MG/ML IV SOLN
INTRAVENOUS | Status: DC | PRN
  Administered 2016-09-23: 60 mg via INTRAVENOUS

## 2016-09-23 MED ORDER — 0.9 % SODIUM CHLORIDE (POUR BTL) OPTIME
TOPICAL | Status: DC | PRN
Start: 2016-09-23 — End: 2016-09-23
  Administered 2016-09-23: 1000 mL

## 2016-09-23 MED ORDER — PROPOFOL 10 MG/ML IV BOLUS
INTRAVENOUS | Status: DC | PRN
  Administered 2016-09-23: 150 mg via INTRAVENOUS

## 2016-09-23 MED ORDER — ACETAMINOPHEN 325 MG PO TABS: 650.0000 mg | ORAL_TABLET | ORAL | Status: DC | PRN

## 2016-09-23 MED ORDER — DEXAMETHASONE SODIUM PHOSPHATE 10 MG/ML IJ SOLN
INTRAMUSCULAR | Status: AC
  Filled 2016-09-23: qty 1

## 2016-09-23 MED ORDER — FENTANYL CITRATE (PF) 100 MCG/2ML IJ SOLN: 25.0000 ug | INTRAMUSCULAR | Status: DC | PRN

## 2016-09-23 MED ORDER — ONDANSETRON HCL 4 MG/2ML IJ SOLN
INTRAMUSCULAR | Status: DC | PRN
  Administered 2016-09-23: 4 mg via INTRAVENOUS

## 2016-09-23 MED ORDER — FENTANYL CITRATE (PF) 100 MCG/2ML IJ SOLN
INTRAMUSCULAR | Status: DC | PRN
  Administered 2016-09-23 (×2): 50 ug via INTRAVENOUS

## 2016-09-23 MED ORDER — LACTATED RINGERS IV SOLN
INTRAVENOUS | Status: DC
  Administered 2016-09-23 (×2): via INTRAVENOUS

## 2016-09-23 MED ORDER — PHENYLEPHRINE HCL 10 MG/ML IJ SOLN
INTRAMUSCULAR | Status: DC | PRN
  Administered 2016-09-23 (×3): 80 ug via INTRAVENOUS

## 2016-09-23 MED ORDER — GLYCOPYRROLATE 0.2 MG/ML IJ SOLN
INTRAMUSCULAR | Status: DC | PRN
  Administered 2016-09-23: .2 mg via INTRAVENOUS

## 2016-09-23 MED ORDER — ONDANSETRON HCL 4 MG/2ML IJ SOLN: 4.0000 mg | Freq: Once | INTRAMUSCULAR | Status: DC | PRN

## 2016-09-23 MED ORDER — ROCURONIUM BROMIDE 100 MG/10ML IV SOLN
INTRAVENOUS | Status: DC | PRN
  Administered 2016-09-23: 10 mg via INTRAVENOUS
  Administered 2016-09-23: 50 mg via INTRAVENOUS

## 2016-09-23 SURGICAL SUPPLY — 49 items
ADAPTER BRONCH F/PENTAX (ADAPTER) ×2 IMPLANT
ADPR BSCP EDG PNTX (ADAPTER) ×1
BRUSH BIOPSY BRONCH 10 SDTNB (MISCELLANEOUS) ×2 IMPLANT
BRUSH CYTOL CELLEBRITY 1.5X140 (MISCELLANEOUS) IMPLANT
BRUSH SUPERTRAX BIOPSY (INSTRUMENTS) IMPLANT
BRUSH SUPERTRAX NDL-TIP CYTO (INSTRUMENTS) ×2 IMPLANT
CANISTER SUCT 3000ML PPV (MISCELLANEOUS) ×4 IMPLANT
CHANNEL WORK EXTEND EDGE 180 (KITS) IMPLANT
CHANNEL WORK EXTEND EDGE 45 (KITS) IMPLANT
CHANNEL WORK EXTEND EDGE 90 (KITS) IMPLANT
CONT SPEC 4OZ CLIKSEAL STRL BL (MISCELLANEOUS) ×6 IMPLANT
COVER BACK TABLE 60X90IN (DRAPES) ×4 IMPLANT
COVER DOME SNAP 22 D (MISCELLANEOUS) ×2 IMPLANT
FILTER STRAW FLUID ASPIR (MISCELLANEOUS) IMPLANT
FORCEPS BIOP RJ4 1.8 (CUTTING FORCEPS) IMPLANT
FORCEPS BIOP SUPERTRX PREMAR (INSTRUMENTS) ×1 IMPLANT
GAUZE SPONGE 4X4 12PLY STRL (GAUZE/BANDAGES/DRESSINGS) ×2 IMPLANT
GLOVE SURG SIGNA 7.5 PF LTX (GLOVE) ×4 IMPLANT
GOWN STRL REUS W/ TWL XL LVL3 (GOWN DISPOSABLE) ×2 IMPLANT
GOWN STRL REUS W/TWL XL LVL3 (GOWN DISPOSABLE) ×4
KIT CLEAN ENDO COMPLIANCE (KITS) ×6 IMPLANT
KIT PROCEDURE EDGE 180 (KITS) ×1 IMPLANT
KIT PROCEDURE EDGE 45 (KITS) IMPLANT
KIT PROCEDURE EDGE 90 (KITS) IMPLANT
KIT ROOM TURNOVER OR (KITS) ×4 IMPLANT
MARKER SKIN DUAL TIP RULER LAB (MISCELLANEOUS) ×4 IMPLANT
NDL BLUNT 18X1 FOR OR ONLY (NEEDLE) IMPLANT
NDL EBUS SONO TIP PENTAX (NEEDLE) ×1 IMPLANT
NDL SUPERTRX PREMARK BIOPSY (NEEDLE) IMPLANT
NEEDLE 22X1 1/2 (OR ONLY) (NEEDLE) IMPLANT
NEEDLE BLUNT 18X1 FOR OR ONLY (NEEDLE) IMPLANT
NEEDLE EBUS SONO TIP PENTAX (NEEDLE) ×2 IMPLANT
NEEDLE SUPERTRX PREMARK BIOPSY (NEEDLE) ×2 IMPLANT
NS IRRIG 1000ML POUR BTL (IV SOLUTION) ×4 IMPLANT
OIL SILICONE PENTAX (PARTS (SERVICE/REPAIRS)) ×4 IMPLANT
PAD ARMBOARD 7.5X6 YLW CONV (MISCELLANEOUS) ×8 IMPLANT
PATCHES PATIENT (LABEL) ×6 IMPLANT
SYR 20CC LL (SYRINGE) ×4 IMPLANT
SYR 20ML ECCENTRIC (SYRINGE) ×4 IMPLANT
SYR 30ML LL (SYRINGE) ×2 IMPLANT
SYR 30ML SLIP (SYRINGE) ×1 IMPLANT
SYR 3ML LL SCALE MARK (SYRINGE) IMPLANT
SYR 5ML LL (SYRINGE) ×4 IMPLANT
SYR 5ML LUER SLIP (SYRINGE) ×2 IMPLANT
TOWEL GREEN STERILE FF (TOWEL DISPOSABLE) ×4 IMPLANT
TRAP SPECIMEN MUCOUS 40CC (MISCELLANEOUS) ×4 IMPLANT
TUBE CONNECTING 20X1/4 (TUBING) ×6 IMPLANT
UNDERPAD 30X30 (UNDERPADS AND DIAPERS) ×2 IMPLANT
WATER STERILE IRR 1000ML POUR (IV SOLUTION) ×4 IMPLANT

## 2016-09-23 NOTE — Anesthesia Preprocedure Evaluation (Addendum)
Anesthesia Evaluation  Patient identified by MRN, date of birth, ID band Patient awake    Reviewed: Allergy & Precautions, H&P , Patient's Chart, lab work & pertinent test results, reviewed documented beta blocker date and time   Airway Mallampati: II  TM Distance: >3 FB Neck ROM: full    Dental no notable dental hx. (+) Caps, Dental Advisory Given   Pulmonary former smoker,    Pulmonary exam normal breath sounds clear to auscultation       Cardiovascular  Rhythm:regular Rate:Normal     Neuro/Psych    GI/Hepatic   Endo/Other    Renal/GU      Musculoskeletal   Abdominal   Peds  Hematology   Anesthesia Other Findings   Reproductive/Obstetrics                            Anesthesia Physical Anesthesia Plan  ASA: III  Anesthesia Plan: General   Post-op Pain Management:    Induction: Intravenous  PONV Risk Score and Plan: 2 and Ondansetron, Dexamethasone, Propofol and Treatment may vary due to age or medical condition  Airway Management Planned: Oral ETT  Additional Equipment:   Intra-op Plan:   Post-operative Plan: Extubation in OR  Informed Consent: I have reviewed the patients History and Physical, chart, labs and discussed the procedure including the risks, benefits and alternatives for the proposed anesthesia with the patient or authorized representative who has indicated his/her understanding and acceptance.   Dental Advisory Given  Plan Discussed with: CRNA and Surgeon  Anesthesia Plan Comments: (  )        Anesthesia Quick Evaluation

## 2016-09-23 NOTE — H&P (View-Only) (Signed)
PCP is Biagio Borg, MD Referring Provider is Tanda Rockers, MD  Chief Complaint  Patient presents with  . Lung Mass    Surgical eval, Chest CT 08/17/16, PET Scan 08/26/16, Spirometry with Graph 08/26/16    HPI: [name] is sent for consultation regarding a right middle lobe lesion.  [name] is a an 83 year old gentleman with a past medical history significant for remote tobacco abuse (quit 40 years ago), benign prostatic hypertrophy, hyperlipidemia, gastroesophageal reflux and arthritis. He recently was being evaluated for a total knee replacement by Dr. Mayer Camel. As part of his preop evaluation he had a chest x-ray which showed a right lung nodule. A CT of the chest was done. It showed a 3.0 x 2.3 x 2.9 cm spiculated nodule in the right middle lobe. There was no hilar or mediastinal adenopathy. A PET CT showed the mass was mildly hypermetabolic with an SUV of 3.1. There was hypermetabolic activity in both hilar regions, 5.8 on the right and 4.9 on the left, associated with small lymph nodes. There was no mediastinal activity and no evidence of distant metastases.  Mr. Fix remains very active. He was playing full court basketball up until his knee problems became too severe to continue. He has no history of cardiac disease. He denies any chest pain, pressure, or tightness at rest or with exertion. He does have prostate enlargement and a history of difficult catheterization. He has chronic migraine headaches, feels those studies off recently. He does take Tums about once a day for indigestion. Zubrod Score: At the time of surgery this patient's most appropriate activity status/level should be described as: [x]     0    Normal activity, no symptoms []     1    Restricted in physical strenuous activity but ambulatory, able to do out light work []     2    Ambulatory and capable of self care, unable to do work activities, up and about >50 % of waking hours                              []     3     Only limited self care, in bed greater than 50% of waking hours []     4    Completely disabled, no self care, confined to bed or chair []     5    Moribund   Past Medical History:  Diagnosis Date  . ALLERGIC RHINITIS 01/23/2007  . Arthritis   . BENIGN PROSTATIC HYPERTROPHY 09/10/2006  . Chronic headaches   . COPD, MILD 04/03/2010   patient denies on preop visit of 08/02/2014   . Difficulty in urination   . FATIGUE 01/08/2008  . GERD 04/15/2010  . H. pylori infection    hx of   . Headache(784.0) 09/10/2006  . HOARSENESS 04/15/2010  . HYPERLIPIDEMIA 09/10/2006  . INSOMNIA-SLEEP DISORDER-UNSPEC 04/24/2009  . PEPTIC ULCER DISEASE 01/23/2007    Past Surgical History:  Procedure Laterality Date  . CATARACT EXTRACTION    . HEMORRHOID BANDING    . JOINT REPLACEMENT    . KNEE ARTHROSCOPY    . LUMBAR LAMINECTOMY/DECOMPRESSION MICRODISCECTOMY Right 08/07/2014   Procedure: complete DECOMPRESSION LUMBAR LAMINECTOMY/MICRODISCECTOMY OF L4-L5 for spinal stenosis, DECOMPRESSION OF L3-L4;  Surgeon: Latanya Maudlin, MD;  Location: [facility];  Service: Orthopedics;  Laterality: Right;  . SHOULDER ARTHROSCOPY     x3, L & R    Family History  Problem  Relation Age of Onset  . Prostate cancer Brother   . Alzheimer's disease Mother   . Alzheimer's disease Sister     Social History Social History  Substance Use Topics  . Smoking status: Former Smoker    Packs/day: 1.00    Years: 23.00    Quit date: 36  . Smokeless tobacco: Never Used  . Alcohol use No     Comment: rare    Current Outpatient Prescriptions  Medication Sig Dispense Refill  . aspirin EC 325 MG tablet Take 325 mg by mouth every 6 (six) hours as needed (Only takes if gabapentin does not work for headaches).     . eszopiclone (LUNESTA) 2 MG TABS tablet Take 1 tablet (2 mg total) by mouth at bedtime as needed for sleep. Take immediately before bedtime 90 tablet 1  . finasteride (PROSCAR) 5 MG tablet Take 5 mg by mouth daily.    [name]  gabapentin (NEURONTIN) 100 MG capsule Take 1 capsule by mouth 2 (two) times daily as needed (headache).   0  . gabapentin (NEURONTIN) 600 MG tablet Take 600 mg by mouth 2 (two) times daily as needed.   0  . silodosin (RAPAFLO) 8 MG CAPS capsule Take 8 mg by mouth daily with breakfast.      No current facility-administered medications for this visit.     Allergies  Allergen Reactions  . Alfuzosin Other (See Comments)    BP went crazy, dizzy, passing out   . Aricept [Donepezil Hcl] Other (See Comments)    Insomnia, headache  . Penicillins Hives         Review of Systems  Constitutional: Negative for activity change, chills, fatigue, fever and unexpected weight change.  HENT: Negative for trouble swallowing and voice change.   Respiratory: Negative for cough, chest tightness, shortness of breath and wheezing.   Cardiovascular: Negative for chest pain and leg swelling.  Gastrointestinal: Positive for abdominal pain (Heartburn). Negative for blood in stool.  Genitourinary: Positive for frequency and urgency.       Enlarged prostate, difficult to catheterize, followed by [facility]  Musculoskeletal: Positive for arthralgias and joint swelling.  Neurological: Positive for headaches. Negative for dizziness, syncope and weakness.       Episode of dysarthria associated with severe migraine in March 2018  Hematological: Negative for adenopathy. Does not bruise/bleed easily.  All other systems reviewed and are negative.   BP (!) 144/77   Pulse (!) 56   Resp 20   Ht 5\' 9"  (1.753 m)   Wt 155 lb (70.3 kg)   SpO2 97% Comment: RA  BMI 22.89 kg/m  Physical Exam  Constitutional: He is oriented to person, place, and time. He appears well-developed and well-nourished. No distress.  HENT:  Head: Normocephalic and atraumatic.  Mouth/Throat: No oropharyngeal exudate.  Eyes: Conjunctivae and EOM are normal. No scleral icterus.  Neck: No thyromegaly present.  Cardiovascular: Normal rate,  regular rhythm and normal heart sounds.  Exam reveals no gallop and no friction rub.   No murmur heard. Pulmonary/Chest: No respiratory distress. He has no wheezes. He has no rales.  Abdominal: Soft. Bowel sounds are normal. He exhibits no distension. There is no tenderness.  Musculoskeletal: He exhibits deformity (Swelling left knee). He exhibits no edema.  Lymphadenopathy:    He has no cervical adenopathy.  Neurological: He is alert and oriented to person, place, and time. No cranial nerve deficit. He exhibits normal muscle tone.  No motor deficit  Skin: Skin is  warm and dry.  Vitals reviewed.    Diagnostic Tests: CT CHEST WITH CONTRAST  TECHNIQUE: Multidetector CT imaging of the chest was performed during intravenous contrast administration. Sagittal and coronal MPR images reconstructed from axial data set.  CONTRAST:  31mL ISOVUE-300 IOPAMIDOL (ISOVUE-300) INJECTION 61% IV  COMPARISON:  None; correlation chest radiograph 08/12/2016  FINDINGS: Cardiovascular: Atherosclerotic calcifications aorta. Aorta normal caliber without aneurysmal dilatation. No pericardial effusion. Pulmonary arteries grossly patent on nondedicated exam. Azygosvein noted within an azygous fissure.  Mediastinum/Nodes: Esophagus unremarkable. Nonspecific 5 mm RIGHT thyroid nodule. Base of cervical region otherwise normal appearance. Scattered normal sized mediastinal and hilar lymph nodes.  Lungs/Pleura: Spiculated mass centrally in RIGHT middle lobe 3.0 x 2.3 x 2.9 cm compatible with pulmonary malignancy. Scattered interstitial thickening in the periphery of the RIGHT lower lobe inferiorly. Remaining lungs clear. No pulmonary infiltrate, pleural effusion or pneumothorax. No additional mass/ nodule.  Upper Abdomen: Mild cortical scarring at upper pole LEFT kidney. Remaining visualized upper abdomen unremarkable.  Musculoskeletal: No acute osseous findings.  IMPRESSION: Spiculated  central RIGHT middle lobe mass 3.0 x 2.3 x 2.9 cm compatible with pulmonary malignancy.  No definite thoracic adenopathy or pulmonary metastases identified.  Nonspecific interstitial thickening in the periphery of the inferior RIGHT lower lobe.  Aortic Atherosclerosis (ICD10-I70.0).   Electronically Signed   By: Lavonia Dana M.D.   On: 08/17/2016 13:20 NUCLEAR MEDICINE PET SKULL BASE TO THIGH  TECHNIQUE: 8.29 mCi F-18 FDG was injected intravenously. Full-ring PET imaging was performed from the skull base to thigh after the radiotracer. CT data was obtained and used for attenuation correction and anatomic localization.  FASTING BLOOD GLUCOSE:  Value: 94 mg/dl  COMPARISON:  Chest CT 08/17/2016  FINDINGS: NECK  No hypermetabolic lymph nodes in the neck.  CHEST  3.5 x 2.1 cm right middle lobe lesion extending along a right middle lobe bronchus is weakly hypermetabolic with SUV max of 3.1. Small hilar lymph node is hypermetabolic with SUV max of 5.8. There is also a small left hilar lymph which is hypermetabolic with SUV max of 4.9.  No other lung lesions are identified. No other areas of hypermetabolism. No pleural effusion.  ABDOMEN/PELVIS  No abnormal hypermetabolic activity within the liver, pancreas, adrenal glands, or spleen. No hypermetabolic lymph nodes in the abdomen or pelvis. New line additional CT findings include bilateral renal cysts. Probable chronic left UPJ obstruction, advanced atherosclerotic calcifications involving the aorta iliac arteries with a remote calcified distal aortic dissection. Markedly enlarged prostate gland is noted with median lobe hypertrophy impressing on the base of the bladder.  SKELETON  No focal hypermetabolic activity to suggest skeletal metastasis.  IMPRESSION: 1. Elongated right middle lobe lung lesion is weakly hypermetabolic but still suspicious for neoplasm. It is sub solid appearance may suggest  adenocarcinoma with FDG positive bilateral hilar lymph nodes. This lesion should be amenable to bronchoscopic biopsy as it is surrounding a right middle lobe bronchus. 2. No findings for metastatic disease involving the abdomen/pelvis, neck or supraclavicular regions. No findings for osseous metastatic disease.   Electronically Signed   By: Marijo Sanes M.D.   On: 08/25/2016 16:24 I personally reviewed the CT and PET/CT and concur with the findings noted above.  Impression: Mr. Dill is an 83 year old gentleman who was found to have a right middle lobe nodule on preoperative evaluation prior to knee replacement. He has a remote history of tobacco abuse less than 20 pack years and quit over 40 years ago. The nodule is  sub-solid , spiculated, and mildly hypermetabolic on CT. This would most likely be a low-grade adenocarcinoma, although infectious or inflammatory etiologies are also within the differential diagnosis.  It is hard to know what to make of the bilateral hilar activity on PET/CT. There are no pathologically enlarged lymph nodes to account for that. It's fairly symmetrical. Contralateral hilar metastasis without mediastinal involvement would be unusual. I do think we should at least investigate that before we proceed with surgical resection, but I doubt it represents metastatic disease.  I recommended to Mr. Ransford that we do an avid patient will bronchoscopy and endobronchial ultrasound to evaluate with the primary lesion in the middle lobe as well as the hilar nodes. Reason for this is that at the left hilar nodes were involved, surgery would not be beneficial for him. I described the procedure to Mr. Nardozzi and his 2 daughters who accompanied him. This will be done in the operating room under general anesthesia on an outpatient basis. There is risk associated with procedures under general anesthesia with rare incidences of MI, stroke, blood clots, or even death. I think  those are all very unlikely in this case given his overall general good health. There is a risk of bleeding or pneumothorax with the biopsies and there also is a risk of failure to make a definitive diagnosis.  He does have a history of urinary retention and is difficult to catheterize. He is followed by [facility]. If he has issues with urinary retention we will consult them postoperatively.  One of his daughters who accompanied him was very upset about the possibility of him having a procedure done at [facility] and would like him to go to [facility]. She requested that his records be sent to do it did not have a physician's name. We will be happy to send his records if Mr. Gut would like [place] to.   Plan: Navigational bronchoscopy and endobronchial ultrasound on Friday, 09/24/2016  [name], MD [facility] [phone]

## 2016-09-23 NOTE — Discharge Instructions (Addendum)
Do not drive or engage in heavy physical activity for 24 hours  You may resume normal activities tomorrow  You may cough up small amounts of blood over the next few days. Call if you cough up more than 2 tablespoons of blood  You may use an over the counter cough medication if needed.  You may use acetaminophen (Tylenol) if needed for pain  Call [phone] if you develop chest pain, shortness of breath, fever > 101F or cough up large amounts of blood  My office will contact you with a follow up appointment for next week

## 2016-09-23 NOTE — Transfer of Care (Signed)
Immediate Anesthesia Transfer of Care Note  Patient: Raymond Logan  Procedure(s) Performed: Procedure(s): VIDEO BRONCHOSCOPY WITH ENDOBRONCHIAL NAVIGATION (N/A) VIDEO BRONCHOSCOPY WITH ENDOBRONCHIAL ULTRASOUND (N/A)  Patient Location: PACU  Anesthesia Type:General  Level of Consciousness: awake, alert , oriented and sedated  Airway & Oxygen Therapy: Patient Spontanous Breathing and Patient connected to nasal cannula oxygen  Post-op Assessment: Report given to RN, Post -op Vital signs reviewed and stable and Patient moving all extremities X 4  Post vital signs: Reviewed and stable  Last Vitals:  Vitals:   09/23/16 0806  BP: 139/68  Pulse: (!) 54  Resp: 18  Temp: 36.5 C    Last Pain:  Vitals:   09/23/16 0835  TempSrc:   PainSc: 4          Complications: No apparent anesthesia complications

## 2016-09-23 NOTE — Brief Op Note (Signed)
09/23/2016  1:54 PM  PATIENT:  Raymond Logan  83 y.o. male  PRE-OPERATIVE DIAGNOSIS:  RIGHT MIDDLE LOBE NODULE  POST-OPERATIVE DIAGNOSIS:  RIGHT MIDDLE LOBE NODULE  PROCEDURE:  Procedure(s): VIDEO BRONCHOSCOPY WITH ENDOBRONCHIAL NAVIGATION (N/A) VIDEO BRONCHOSCOPY WITH ENDOBRONCHIAL ULTRASOUND (N/A)  SURGEON:  Surgeon(s) and Role:    * [name], MD - Primary  ANESTHESIA:   general  EBL:  Total I/O In: 1000 [I.V.:1000] Out: 25 [Blood:25]  BLOOD ADMINISTERED:none  DRAINS: none   LOCAL MEDICATIONS USED:  NONE  SPECIMEN:  Source of Specimen:  10L, 7, 11R nodes, RML nodule  DISPOSITION OF SPECIMEN:  PATHOLOGY  PLAN OF CARE: DC home  PATIENT DISPOSITION:  PACU - hemodynamically stable.   Delay start of Pharmacological VTE agent (>24hrs) due to surgical blood loss or risk of bleeding: not applicable

## 2016-09-23 NOTE — Anesthesia Postprocedure Evaluation (Signed)
Anesthesia Post Note  Patient: TAVIN VERNET  Procedure(s) Performed: Procedure(s) (LRB): VIDEO BRONCHOSCOPY WITH ENDOBRONCHIAL NAVIGATION (N/A) VIDEO BRONCHOSCOPY WITH ENDOBRONCHIAL ULTRASOUND (N/A)     Patient location during evaluation: PACU Anesthesia Type: General Level of consciousness: awake and alert Pain management: pain level controlled Vital Signs Assessment: post-procedure vital signs reviewed and stable Respiratory status: spontaneous breathing, nonlabored ventilation, respiratory function stable and patient connected to nasal cannula oxygen Cardiovascular status: blood pressure returned to baseline and stable Postop Assessment: no signs of nausea or vomiting Anesthetic complications: no    Last Vitals:  Vitals:   09/23/16 1419 09/23/16 1430  BP: 98/65 (!) 102/55  Pulse: 72 72  Resp: 19 18  Temp:      Last Pain:  Vitals:   09/23/16 1430  TempSrc:   PainSc: 0-No pain                 Malkia Nippert EDWARD

## 2016-09-23 NOTE — Anesthesia Procedure Notes (Signed)
Procedure Name: Intubation Date/Time: 09/23/2016 11:38 AM Performed by: Gaylene Brooks Pre-anesthesia Checklist: Patient identified, Emergency Drugs available, Suction available and Patient being monitored Patient Re-evaluated:Patient Re-evaluated prior to induction Oxygen Delivery Method: Circle system utilized Preoxygenation: Pre-oxygenation with 100% oxygen Induction Type: IV induction Ventilation: Mask ventilation without difficulty and Oral airway inserted - appropriate to patient size Laryngoscope Size: Mac and 3 Grade View: Grade II Tube type: Oral Tube size: 8.5 mm Number of attempts: 1 Airway Equipment and Method: Stylet Placement Confirmation: ETT inserted through vocal cords under direct vision,  positive ETCO2 and breath sounds checked- equal and bilateral Secured at: 24 cm Tube secured with: Tape Dental Injury: Teeth and Oropharynx as per pre-operative assessment

## 2016-09-23 NOTE — Interval H&P Note (Signed)
History and Physical Interval Note:  09/23/2016 11:07 AM  Raymond Logan  has presented today for surgery, with the diagnosis of RML NODULE  The various methods of treatment have been discussed with the patient and family. After consideration of risks, benefits and other options for treatment, the patient has consented to  Procedure(s): VIDEO BRONCHOSCOPY WITH ENDOBRONCHIAL NAVIGATION (N/A) VIDEO BRONCHOSCOPY WITH ENDOBRONCHIAL ULTRASOUND (N/A) as a surgical intervention .  The patient's history has been reviewed, patient examined, no change in status, stable for surgery.  I have reviewed the patient's chart and labs.  Questions were answered to the patient's satisfaction.     [name]

## 2016-09-24 ENCOUNTER — Encounter (HOSPITAL_COMMUNITY): Payer: Self-pay | Admitting: Thoracic Surgery (Cardiothoracic Vascular Surgery)

## 2016-09-24 NOTE — Op Note (Signed)
NAMEAMELIA, Raymond Logan NO.:  [id number]  MEDICAL RECORD NO.:  [id number]  LOCATION:                                 FACILITY:  PHYSICIAN:  Revonda Standard. Roxan Hockey, M.D.DATE OF BIRTH:  [birth year]  DATE OF PROCEDURE:  09/23/2016 DATE OF DISCHARGE:                              OPERATIVE REPORT   PREOPERATIVE DIAGNOSES:  Right middle lobe nodule, bilateral hilar lymph node hypermetabolism.  POSTOPERATIVE DIAGNOSES:  Right middle lobe nodule, bilateral hilar lymph node hypermetabolism.  PROCEDURE:   1.Electromagnetic navigational bronchoscopy with needle aspirations, brushings, and transbronchial biopsies 2.Endobronchial ultrasound.  SURGEON:  Revonda Standard. Roxan Hockey, M.D.  ASSISTANT:  None.  ANESTHESIA:  General.  FINDINGS:  Aspiration of lymph nodes showed bloody specimens with no tumor seen.  Needle aspirations and brushings from right middle lobe showed atypical cells.  CLINICAL NOTE:  Raymond Logan is an 83 year old gentleman with a remote history of tobacco abuse who had a chest x-ray while being evaluated for a total knee replacement. It showed a right lung nodule, which was confirmed by CT scan.  There was no hilar or mediastinal adenopathy on CT.  On PET-CT, the mass was mildly hypermetabolic and there also was significant hypermetabolic activity in both hilar regions associated with small lymph nodes.  The patient was advised to undergo navigational bronchoscopy and endobronchial ultrasound for further assessment.  The indications, risks, benefits, and alternatives were discussed in detail with the patient.  He understood and accepted the risks and agreed to proceed.  OPERATIVE NOTE:  Raymond Logan was brought to the operating room on September 23, 2016.  He had induction of general anesthesia.  Flexible fiberoptic bronchoscopy was performed via the endotracheal tube. It revealed normal endobronchial anatomy and no endobronchial lesions to the level of  the subsegmental bronchi.  The endobronchial ultrasound probe was advanced and systematic inspection of the mediastinal and hilar lymph node stations was carried out.  No pathologically enlarged nodes were seen in any location.  There was a node visible in the left hilum at the carina between the upper and lower lobe bronchi in the site where the hypermetabolism was noted on PET-CT.  Aspirations were performed on this node. With each node that was aspirated, multiple aspirations were performed.  All were done using ultrasound visualization.  Specimens were obtained both with and without suction applied.  Specimens were applied to slides and placed into cytologic preparation fluid for cell block.  The scope then was pulled back to the main carina and a relatively normal-appearing node was noted in the subcarinal space.  Aspirations were performed of this node as well.  Finally, the scope was advanced into the right main stem.  There was no node with a window for aspiration in the 4R and 10R locations.  There was an 11R node. It did not appear pathologically enlarged.  Aspirations were performed from this node in a similar fashion.  The endobronchial ultrasound probe was removed.  These specimens were sent for quick prep.  Next, the bronchoscope was replaced and the locatable guide for navigation was placed.  Registration was performed, and there was good correlation of the video on virtual bronchoscopy.  The  scope then was directed to the origin of the right middle lobe bronchus and the appropriate segmental bronchus was cannulated.  The locatable guide was advanced within 4 mm of the center of the nodule seen on CT.  Needle aspirations and brushings using a triple brush were performed.  All sampling with during navigational bronchoscopy was done with fluoroscopic visualization.  The total fluoroscopy time was 5.1 minutes with a total of 65.55 mGy of radiation.  These specimens were sent  for quick prep.  The lymph nodes returned showing blood, but no tumor was seen.  While awaiting the results of the needle aspirations and brushings from the right middle lobe, multiple biopsies were taken.  The locatable guide was repositioned periodically to try to biopsy different parts of the lesion and ensure continued good alignment and proximity to the lesion.  There was minimal bleeding with biopsies.  When the biopsies were completed, bronchoalveolar lavage was performed by instilling 100 mL of saline through the sheath for locatable guide, approximately 20 mL of bloody fluid was withdrawn.  The needle aspirations and brushings returned showing atypical cells.  The patient was extubated in the operating room and taken to the postanesthetic care unit in good condition.     Revonda Standard Roxan Hockey, M.D.     SCH/MEDQ  D:  09/23/2016  T:  09/24/2016  Job:  [id number]

## 2016-09-29 ENCOUNTER — Encounter: Payer: Self-pay | Admitting: Thoracic Surgery (Cardiothoracic Vascular Surgery)

## 2016-09-29 ENCOUNTER — Ambulatory Visit (INDEPENDENT_AMBULATORY_CARE_PROVIDER_SITE_OTHER): Payer: Medicare Other | Admitting: Thoracic Surgery (Cardiothoracic Vascular Surgery)

## 2016-09-29 ENCOUNTER — Other Ambulatory Visit: Payer: Self-pay | Admitting: *Deleted

## 2016-09-29 VITALS — BP 107/65 | HR 67 | Resp 16 | Ht 69.0 in | Wt 160.0 lb

## 2016-09-29 DIAGNOSIS — Z9889 Other specified postprocedural states: Secondary | ICD-10-CM | POA: Diagnosis not present

## 2016-09-29 DIAGNOSIS — C3491 Malignant neoplasm of unspecified part of right bronchus or lung: Secondary | ICD-10-CM

## 2016-09-29 DIAGNOSIS — R918 Other nonspecific abnormal finding of lung field: Secondary | ICD-10-CM

## 2016-09-29 NOTE — Progress Notes (Signed)
      [address]       [place] [address]             [phone]      [name] returns to discuss the results of his navigational bronchoscopy and endobronchial ultrasound.  He is an 83 year old gentleman with a past medical history of remote tobacco abuse, hypokalemia, gastroesophageal reflux, arthritis, and BPH. He was being evaluated for a total knee replacement. His preoperative chest x-ray showed a right lung nodule. CT of the chest showed a 3 x 2.3 x 2.9 cm spiculated nodule in the right middle lobe. The nodule is mildly hypermetabolic on PET/CT. There was hypermetabolic activity in both hilar regions associated with small lymph nodes. There is no activity in the mediastinum and no evidence of distant metastases..  I did a navigational bronchoscopy and endobronchial ultrasound on 09/23/2016. The lymph node aspirations showed no evidence of tumor. Brushings from the right middle lobe showed non-small cell carcinoma.  [name] appears to have stage I non-small cell carcinoma of the right middle lobe. I discussed the options of surgical resection versus radiation. We discussed the relative advantages and disadvantages of each of those approaches.  I described the proposed operation to him. We will plan to do a right VATS and middle lobectomy. He understands general nature of the procedure, the incisions to be used, the general anesthesia, the use of the drainage tube postoperative, the expected hospital stay, and the overall recovery. I reviewed the indications, risks, benefits, and alternatives. He understands the risks include, but are not limited to death, MI, DVT, PE, bleeding, possible need for transfusion, stroke, infection, cardiac arrhythmias, prolonged air leaks, as well as possibility of other unforeseeable complications.  His daughter Raymond Logan listened in on the phone during the consultation.  He wishes to proceed with right middle  lobectomy.  Plan  Right VATS for middle lobectomy on Thursday, 10/07/2016.

## 2016-10-05 ENCOUNTER — Ambulatory Visit (HOSPITAL_COMMUNITY)
Admission: RE | Admit: 2016-10-05 | Discharge: 2016-10-05 | Disposition: A | Discharge status: Home / Self Care | Payer: Medicare Other | Source: Ambulatory Visit | Attending: Thoracic Surgery (Cardiothoracic Vascular Surgery) | Admitting: Thoracic Surgery (Cardiothoracic Vascular Surgery)

## 2016-10-05 ENCOUNTER — Other Ambulatory Visit: Payer: Self-pay | Admitting: Urology

## 2016-10-05 ENCOUNTER — Encounter (HOSPITAL_COMMUNITY)
Admission: RE | Admit: 2016-10-05 | Discharge: 2016-10-05 | Disposition: A | Discharge status: Home / Self Care | Payer: Medicare Other | Source: Ambulatory Visit | Attending: Thoracic Surgery (Cardiothoracic Vascular Surgery) | Admitting: Thoracic Surgery (Cardiothoracic Vascular Surgery)

## 2016-10-05 ENCOUNTER — Other Ambulatory Visit: Payer: Self-pay

## 2016-10-05 ENCOUNTER — Encounter (HOSPITAL_COMMUNITY): Payer: Self-pay

## 2016-10-05 DIAGNOSIS — C3491 Malignant neoplasm of unspecified part of right bronchus or lung: Secondary | ICD-10-CM

## 2016-10-05 DIAGNOSIS — M19042 Primary osteoarthritis, left hand: Secondary | ICD-10-CM | POA: Diagnosis not present

## 2016-10-05 DIAGNOSIS — G4700 Insomnia, unspecified: Secondary | ICD-10-CM | POA: Diagnosis not present

## 2016-10-05 DIAGNOSIS — C342 Malignant neoplasm of middle lobe, bronchus or lung: Secondary | ICD-10-CM | POA: Diagnosis not present

## 2016-10-05 DIAGNOSIS — N401 Enlarged prostate with lower urinary tract symptoms: Secondary | ICD-10-CM | POA: Diagnosis not present

## 2016-10-05 DIAGNOSIS — E785 Hyperlipidemia, unspecified: Secondary | ICD-10-CM | POA: Diagnosis not present

## 2016-10-05 DIAGNOSIS — Z01810 Encounter for preprocedural cardiovascular examination: Secondary | ICD-10-CM

## 2016-10-05 DIAGNOSIS — J449 Chronic obstructive pulmonary disease, unspecified: Secondary | ICD-10-CM | POA: Diagnosis not present

## 2016-10-05 DIAGNOSIS — M19041 Primary osteoarthritis, right hand: Secondary | ICD-10-CM | POA: Diagnosis not present

## 2016-10-05 DIAGNOSIS — I9789 Other postprocedural complications and disorders of the circulatory system, not elsewhere classified: Secondary | ICD-10-CM | POA: Diagnosis not present

## 2016-10-05 DIAGNOSIS — Z01818 Encounter for other preprocedural examination: Secondary | ICD-10-CM | POA: Insufficient documentation

## 2016-10-05 DIAGNOSIS — Z01812 Encounter for preprocedural laboratory examination: Secondary | ICD-10-CM | POA: Insufficient documentation

## 2016-10-05 DIAGNOSIS — K219 Gastro-esophageal reflux disease without esophagitis: Secondary | ICD-10-CM | POA: Diagnosis not present

## 2016-10-05 DIAGNOSIS — J9383 Other pneumothorax: Secondary | ICD-10-CM | POA: Diagnosis not present

## 2016-10-05 DIAGNOSIS — I700 Atherosclerosis of aorta: Secondary | ICD-10-CM | POA: Diagnosis not present

## 2016-10-05 DIAGNOSIS — G43909 Migraine, unspecified, not intractable, without status migrainosus: Secondary | ICD-10-CM | POA: Diagnosis not present

## 2016-10-05 HISTORY — DX: Pneumonia, unspecified organism: J18.9

## 2016-10-05 LAB — URINALYSIS, ROUTINE W REFLEX MICROSCOPIC
Bilirubin Urine: NEGATIVE
GLUCOSE, UA: NEGATIVE mg/dL
Hgb urine dipstick: NEGATIVE
Ketones, ur: NEGATIVE mg/dL
Nitrite: NEGATIVE
PH: 6 (ref 5.0–8.0)
Protein, ur: NEGATIVE mg/dL
SPECIFIC GRAVITY, URINE: 1.014 (ref 1.005–1.030)
SQUAMOUS EPITHELIAL / LPF: NONE SEEN

## 2016-10-05 LAB — BLOOD GAS, ARTERIAL
Acid-base deficit: 0.6 mmol/L (ref 0.0–2.0)
Bicarbonate: 23.7 mmol/L (ref 20.0–28.0)
DRAWN BY: 449841
FIO2: 21
O2 SAT: 96.8 %
PATIENT TEMPERATURE: 98.6
PO2 ART: 89.6 mmHg (ref 83.0–108.0)
pCO2 arterial: 39.7 mmHg (ref 32.0–48.0)
pH, Arterial: 7.393 (ref 7.350–7.450)

## 2016-10-05 LAB — COMPREHENSIVE METABOLIC PANEL
ALBUMIN: 3.8 g/dL (ref 3.5–5.0)
ALK PHOS: 64 U/L (ref 38–126)
ALT: 12 U/L — AB (ref 17–63)
ANION GAP: 9 (ref 5–15)
AST: 17 U/L (ref 15–41)
BUN: 16 mg/dL (ref 6–20)
CHLORIDE: 109 mmol/L (ref 101–111)
CO2: 21 mmol/L — AB (ref 22–32)
CREATININE: 0.96 mg/dL (ref 0.61–1.24)
Calcium: 8.9 mg/dL (ref 8.9–10.3)
GFR calc non Af Amer: >60 mL/min (ref >60)
GLUCOSE: 101 mg/dL — AB (ref 65–99)
Potassium: 4.2 mmol/L (ref 3.5–5.1)
SODIUM: 139 mmol/L (ref 135–145)
Total Bilirubin: 0.8 mg/dL (ref 0.3–1.2)
Total Protein: 6.3 g/dL — ABNORMAL LOW (ref 6.5–8.1)

## 2016-10-05 LAB — CBC
HCT: 36.9 % — ABNORMAL LOW (ref 39.0–52.0)
HEMOGLOBIN: 13.1 g/dL (ref 13.0–17.0)
MCH: 30.8 pg (ref 26.0–34.0)
MCHC: 35.5 g/dL (ref 30.0–36.0)
MCV: 86.6 fL (ref 78.0–100.0)
PLATELETS: 171 10*3/uL (ref 150–400)
RBC: 4.26 MIL/uL (ref 4.22–5.81)
RDW: 12.4 % (ref 11.5–15.5)
WBC: 5.2 10*3/uL (ref 4.0–10.5)

## 2016-10-05 LAB — SURGICAL PCR SCREEN
MRSA, PCR: NEGATIVE
Staphylococcus aureus: NEGATIVE

## 2016-10-05 LAB — PROTIME-INR
INR: 1.07
Prothrombin Time: 13.9 seconds (ref 11.4–15.2)

## 2016-10-05 LAB — APTT: APTT: 31 s (ref 24–36)

## 2016-10-05 NOTE — Pre-Procedure Instructions (Signed)
Raymond Logan  10/05/2016    Your procedure is scheduled on Thursday, August 9.  Report to [facility] Admitting at :5:55 AM               Your surgery or procedure is scheduled for 7:55 AM   Call this number if you have problems the morning of surgery: [phone]- pre op desk                 Remember:  Do not eat food or drink liquids after midnight Wednesday, August 8.  Take these medicines the morning of surgery with A SIP OF WATER : gabapentin (NEURONTIN).                 1 Week prior to surgery STOP taking Aspirin, Aspirin Products (Goody Powder, Excedrin Migraine), Ibuprofen (Advil), Naproxen (Aleve), Vitamins and Herbal Products (ie Fish Oil) Special instructions:   [place]- Preparing For Surgery  Before surgery, you can play an important role. Because skin is not sterile, your skin needs to be as free of germs as possible. You can reduce the number of germs on your skin by washing with CHG (chlorahexidine gluconate) Soap before surgery.  CHG is an antiseptic cleaner which kills germs and bonds with the skin to continue killing germs even after washing.  Please do not use if you have an allergy to CHG or antibacterial soaps. If your skin becomes reddened/irritated stop using the CHG.  Do not shave (including legs and underarms) for at least 48 hours prior to first CHG shower. It is OK to shave your face.  Please follow these instructions carefully.   1. Shower the NIGHT BEFORE SURGERY and the MORNING OF SURGERY with CHG.   2. If you chose to wash your hair, wash your hair first as usual with your normal shampoo.  3. After you shampoo, rinse your hair and body thoroughly to remove the shampoo.  Wash your face and private area with the soap you use at home, then rinse.  4. Use CHG as you would any other liquid soap. You can apply CHG directly to the skin and wash gently with a scrungie or a clean washcloth.   5. Apply the CHG Soap to your body ONLY FROM  THE NECK DOWN.  Do not use on open wounds or open sores. Avoid contact with your eyes, ears, mouth and genitals (private parts). Wash genitals (private parts) with your normal soap.  6. Wash thoroughly, paying special attention to the area where your surgery will be performed.  7. Thoroughly rinse your body with warm water from the neck down.  8. DO NOT shower/wash with your normal soap after using and rinsing off the CHG Soap.  9. Pat yourself dry with a CLEAN TOWEL.   10. Wear CLEAN PAJAMAS   11. Place CLEAN SHEETS on your bed the night of your first shower and DO NOT SLEEP WITH PETS.  Day of Surgery: Shower as above Do not apply any deodorants/lotions, powders or cologne. Please wear clean clothes to the hospital/surgery center.    Do not wear jewelry, make-up or nail polish.  Do not shave 48 hours prior to surgery.  Men may shave face and neck.  Do not bring valuables to the hospital.  [facility] is not responsible for any belongings or valuables.  Contacts, dentures or bridgework may not be worn into surgery.  Leave your suitcase in the car.  After surgery it may be  brought to your room.  For patients admitted to the hospital, discharge time will be determined by your treatment team.  Patients discharged the day of surgery will not be allowed to drive home.   Please read over the following fact sheets that you were given: Pain Booklet, Patient Instructions for Mupirocin Application, Incentive Spirometry, Surgical Site Infections.

## 2016-10-07 ENCOUNTER — Inpatient Hospital Stay (HOSPITAL_COMMUNITY): Payer: Medicare Other | Admitting: Anesthesiology

## 2016-10-07 ENCOUNTER — Encounter (HOSPITAL_COMMUNITY)
Admission: RE | Disposition: A | Discharge status: Home Health | Payer: Self-pay | Source: Ambulatory Visit | Attending: Thoracic Surgery (Cardiothoracic Vascular Surgery)

## 2016-10-07 ENCOUNTER — Inpatient Hospital Stay (HOSPITAL_COMMUNITY): Payer: Medicare Other

## 2016-10-07 ENCOUNTER — Inpatient Hospital Stay (HOSPITAL_COMMUNITY)
Admission: RE | Admit: 2016-10-07 | Discharge: 2016-10-12 | DRG: 164 | Disposition: A | Discharge status: Home Health | Payer: Medicare Other | Source: Ambulatory Visit | Attending: Thoracic Surgery (Cardiothoracic Vascular Surgery) | Admitting: Thoracic Surgery (Cardiothoracic Vascular Surgery)

## 2016-10-07 ENCOUNTER — Encounter (HOSPITAL_COMMUNITY): Payer: Self-pay | Admitting: *Deleted

## 2016-10-07 DIAGNOSIS — G43909 Migraine, unspecified, not intractable, without status migrainosus: Secondary | ICD-10-CM | POA: Diagnosis not present

## 2016-10-07 DIAGNOSIS — Z820 Family history of epilepsy and other diseases of the nervous system: Secondary | ICD-10-CM | POA: Diagnosis not present

## 2016-10-07 DIAGNOSIS — Z79899 Other long term (current) drug therapy: Secondary | ICD-10-CM

## 2016-10-07 DIAGNOSIS — M25462 Effusion, left knee: Secondary | ICD-10-CM | POA: Diagnosis present

## 2016-10-07 DIAGNOSIS — J90 Pleural effusion, not elsewhere classified: Secondary | ICD-10-CM | POA: Diagnosis not present

## 2016-10-07 DIAGNOSIS — I700 Atherosclerosis of aorta: Secondary | ICD-10-CM | POA: Diagnosis present

## 2016-10-07 DIAGNOSIS — J939 Pneumothorax, unspecified: Secondary | ICD-10-CM

## 2016-10-07 DIAGNOSIS — I9789 Other postprocedural complications and disorders of the circulatory system, not elsewhere classified: Secondary | ICD-10-CM | POA: Diagnosis not present

## 2016-10-07 DIAGNOSIS — M1990 Unspecified osteoarthritis, unspecified site: Secondary | ICD-10-CM | POA: Diagnosis not present

## 2016-10-07 DIAGNOSIS — E785 Hyperlipidemia, unspecified: Secondary | ICD-10-CM | POA: Diagnosis present

## 2016-10-07 DIAGNOSIS — Z4682 Encounter for fitting and adjustment of non-vascular catheter: Secondary | ICD-10-CM | POA: Diagnosis not present

## 2016-10-07 DIAGNOSIS — Z8711 Personal history of peptic ulcer disease: Secondary | ICD-10-CM | POA: Diagnosis not present

## 2016-10-07 DIAGNOSIS — Z8042 Family history of malignant neoplasm of prostate: Secondary | ICD-10-CM | POA: Diagnosis not present

## 2016-10-07 DIAGNOSIS — K219 Gastro-esophageal reflux disease without esophagitis: Secondary | ICD-10-CM | POA: Diagnosis present

## 2016-10-07 DIAGNOSIS — Z96659 Presence of unspecified artificial knee joint: Secondary | ICD-10-CM | POA: Diagnosis present

## 2016-10-07 DIAGNOSIS — Z9849 Cataract extraction status, unspecified eye: Secondary | ICD-10-CM | POA: Diagnosis not present

## 2016-10-07 DIAGNOSIS — Z7982 Long term (current) use of aspirin: Secondary | ICD-10-CM

## 2016-10-07 DIAGNOSIS — Z888 Allergy status to other drugs, medicaments and biological substances status: Secondary | ICD-10-CM

## 2016-10-07 DIAGNOSIS — C3491 Malignant neoplasm of unspecified part of right bronchus or lung: Secondary | ICD-10-CM

## 2016-10-07 DIAGNOSIS — N401 Enlarged prostate with lower urinary tract symptoms: Secondary | ICD-10-CM | POA: Diagnosis present

## 2016-10-07 DIAGNOSIS — J9383 Other pneumothorax: Secondary | ICD-10-CM | POA: Diagnosis present

## 2016-10-07 DIAGNOSIS — J449 Chronic obstructive pulmonary disease, unspecified: Secondary | ICD-10-CM | POA: Diagnosis present

## 2016-10-07 DIAGNOSIS — C342 Malignant neoplasm of middle lobe, bronchus or lung: Principal | ICD-10-CM | POA: Diagnosis present

## 2016-10-07 DIAGNOSIS — Z902 Acquired absence of lung [part of]: Secondary | ICD-10-CM | POA: Diagnosis not present

## 2016-10-07 DIAGNOSIS — M19042 Primary osteoarthritis, left hand: Secondary | ICD-10-CM | POA: Diagnosis present

## 2016-10-07 DIAGNOSIS — Z87891 Personal history of nicotine dependence: Secondary | ICD-10-CM | POA: Diagnosis not present

## 2016-10-07 DIAGNOSIS — I4891 Unspecified atrial fibrillation: Secondary | ICD-10-CM | POA: Diagnosis not present

## 2016-10-07 DIAGNOSIS — E876 Hypokalemia: Secondary | ICD-10-CM | POA: Diagnosis present

## 2016-10-07 DIAGNOSIS — R197 Diarrhea, unspecified: Secondary | ICD-10-CM | POA: Diagnosis not present

## 2016-10-07 DIAGNOSIS — R918 Other nonspecific abnormal finding of lung field: Secondary | ICD-10-CM | POA: Diagnosis not present

## 2016-10-07 DIAGNOSIS — G4700 Insomnia, unspecified: Secondary | ICD-10-CM | POA: Diagnosis present

## 2016-10-07 DIAGNOSIS — R338 Other retention of urine: Secondary | ICD-10-CM | POA: Diagnosis not present

## 2016-10-07 DIAGNOSIS — Z880 Allergy status to penicillin: Secondary | ICD-10-CM

## 2016-10-07 DIAGNOSIS — R112 Nausea with vomiting, unspecified: Secondary | ICD-10-CM | POA: Diagnosis not present

## 2016-10-07 DIAGNOSIS — R51 Headache: Secondary | ICD-10-CM | POA: Diagnosis not present

## 2016-10-07 DIAGNOSIS — M19041 Primary osteoarthritis, right hand: Secondary | ICD-10-CM | POA: Diagnosis not present

## 2016-10-07 HISTORY — PX: VIDEO ASSISTED THORACOSCOPY (VATS)/ LOBECTOMY: SHX6169

## 2016-10-07 HISTORY — PX: INSERTION OF SUPRAPUBIC CATHETER: SHX5870

## 2016-10-07 HISTORY — PX: CYSTOSCOPY: SHX5120

## 2016-10-07 LAB — PREPARE RBC (CROSSMATCH)

## 2016-10-07 SURGERY — VIDEO ASSISTED THORACOSCOPY (VATS)/ LOBECTOMY
Anesthesia: General | Site: Chest | Laterality: Right

## 2016-10-07 MED ORDER — PROPOFOL 10 MG/ML IV BOLUS
INTRAVENOUS | Status: AC
  Filled 2016-10-07: qty 20

## 2016-10-07 MED ORDER — LIDOCAINE 2% (20 MG/ML) 5 ML SYRINGE
INTRAMUSCULAR | Status: AC
  Filled 2016-10-07: qty 5

## 2016-10-07 MED ORDER — MIDAZOLAM HCL 2 MG/2ML IJ SOLN
1.0000 mg | Freq: Once | INTRAMUSCULAR | Status: AC
  Administered 2016-10-07: 1 mg via INTRAVENOUS

## 2016-10-07 MED ORDER — PROPOFOL 10 MG/ML IV BOLUS
INTRAVENOUS | Status: DC | PRN
  Administered 2016-10-07: 100 mg via INTRAVENOUS

## 2016-10-07 MED ORDER — GLYCOPYRROLATE 0.2 MG/ML IJ SOLN
INTRAMUSCULAR | Status: DC | PRN
  Administered 2016-10-07: 0.2 mg via INTRAVENOUS

## 2016-10-07 MED ORDER — ACETAMINOPHEN 160 MG/5ML PO SOLN: 1000.0000 mg | Freq: Four times a day (QID) | ORAL | Status: DC

## 2016-10-07 MED ORDER — TRAMADOL HCL 50 MG PO TABS: 50.0000 mg | ORAL_TABLET | Freq: Four times a day (QID) | ORAL | Status: DC | PRN

## 2016-10-07 MED ORDER — SODIUM CHLORIDE 0.9% FLUSH: 9.0000 mL | INTRAVENOUS | Status: DC | PRN

## 2016-10-07 MED ORDER — ZOLPIDEM TARTRATE 5 MG PO TABS: 5.0000 mg | ORAL_TABLET | Freq: Every evening | ORAL | Status: DC | PRN

## 2016-10-07 MED ORDER — MIDAZOLAM HCL 5 MG/5ML IJ SOLN
INTRAMUSCULAR | Status: DC | PRN
  Administered 2016-10-07: 2 mg via INTRAVENOUS

## 2016-10-07 MED ORDER — MIDAZOLAM HCL 2 MG/2ML IJ SOLN
INTRAMUSCULAR | Status: AC
  Filled 2016-10-07: qty 2

## 2016-10-07 MED ORDER — SUGAMMADEX SODIUM 200 MG/2ML IV SOLN
INTRAVENOUS | Status: DC | PRN
  Administered 2016-10-07: 150 mg via INTRAVENOUS

## 2016-10-07 MED ORDER — LACTATED RINGERS IV SOLN
INTRAVENOUS | Status: DC
  Administered 2016-10-07 (×2): via INTRAVENOUS

## 2016-10-07 MED ORDER — ROCURONIUM BROMIDE 10 MG/ML (PF) SYRINGE
PREFILLED_SYRINGE | INTRAVENOUS | Status: AC
  Filled 2016-10-07: qty 5

## 2016-10-07 MED ORDER — 0.9 % SODIUM CHLORIDE (POUR BTL) OPTIME
TOPICAL | Status: DC | PRN
  Administered 2016-10-07: 2000 mL

## 2016-10-07 MED ORDER — METOCLOPRAMIDE HCL 5 MG/ML IJ SOLN
10.0000 mg | Freq: Four times a day (QID) | INTRAMUSCULAR | Status: DC
  Administered 2016-10-07 – 2016-10-08 (×3): 10 mg via INTRAVENOUS
  Filled 2016-10-07 (×3): qty 2

## 2016-10-07 MED ORDER — BUPIVACAINE ON-Q PAIN PUMP (FOR ORDER SET NO CHG)
INJECTION | Status: AC
  Filled 2016-10-07: qty 1

## 2016-10-07 MED ORDER — BUPIVACAINE 0.5 % ON-Q PUMP SINGLE CATH 400 ML
400.0000 mL | INJECTION | Status: DC
  Filled 2016-10-07: qty 400

## 2016-10-07 MED ORDER — HYDROMORPHONE 1 MG/ML IV SOLN
INTRAVENOUS | Status: DC
  Administered 2016-10-07: 3 mg via INTRAVENOUS
  Administered 2016-10-08: 8.2 mL via INTRAVENOUS
  Administered 2016-10-08: 1.6 mL via INTRAVENOUS
  Administered 2016-10-08: 1 mg via INTRAVENOUS
  Administered 2016-10-08: 1.4 mL via INTRAVENOUS
  Administered 2016-10-08: 0.6 mg via INTRAVENOUS
  Administered 2016-10-08: 2.5 mg via INTRAVENOUS
  Administered 2016-10-09: 0.4 mg via INTRAVENOUS
  Administered 2016-10-09: 2.5 mg via INTRAVENOUS
  Administered 2016-10-09: 0.6 mg via INTRAVENOUS
  Administered 2016-10-09: 2.5 mg via INTRAVENOUS
  Administered 2016-10-09: 4 mL via INTRAVENOUS
  Administered 2016-10-09: 1 mg via INTRAVENOUS
  Administered 2016-10-10: 0.2 mg via INTRAVENOUS
  Administered 2016-10-10: 1 mg via INTRAVENOUS
  Filled 2016-10-07: qty 25

## 2016-10-07 MED ORDER — SURGILUBE EX GEL
CUTANEOUS | Status: DC | PRN
  Administered 2016-10-07: 1 via TOPICAL

## 2016-10-07 MED ORDER — CIPROFLOXACIN IN D5W 400 MG/200ML IV SOLN
400.0000 mg | Freq: Once | INTRAVENOUS | Status: AC
  Administered 2016-10-07: 400 mg via INTRAVENOUS
  Filled 2016-10-07: qty 200

## 2016-10-07 MED ORDER — ONDANSETRON HCL 4 MG/2ML IJ SOLN: 4.0000 mg | Freq: Four times a day (QID) | INTRAMUSCULAR | Status: DC | PRN

## 2016-10-07 MED ORDER — VANCOMYCIN HCL IN DEXTROSE 1-5 GM/200ML-% IV SOLN
1000.0000 mg | INTRAVENOUS | Status: AC
  Administered 2016-10-07: 1000 mg via INTRAVENOUS

## 2016-10-07 MED ORDER — HYDROMORPHONE HCL 1 MG/ML IJ SOLN
INTRAMUSCULAR | Status: AC
  Filled 2016-10-07: qty 1

## 2016-10-07 MED ORDER — DIPHENHYDRAMINE HCL 50 MG/ML IJ SOLN: 12.5000 mg | Freq: Four times a day (QID) | INTRAMUSCULAR | Status: DC | PRN

## 2016-10-07 MED ORDER — HYDROMORPHONE HCL 1 MG/ML IJ SOLN
0.2500 mg | INTRAMUSCULAR | Status: DC | PRN
  Administered 2016-10-07 (×2): 0.5 mg via INTRAVENOUS

## 2016-10-07 MED ORDER — FENTANYL CITRATE (PF) 250 MCG/5ML IJ SOLN
INTRAMUSCULAR | Status: AC
  Filled 2016-10-07: qty 5

## 2016-10-07 MED ORDER — HYDROMORPHONE 1 MG/ML IV SOLN
INTRAVENOUS | Status: AC
  Administered 2016-10-07: 1.4 mg
  Administered 2016-10-07: 1.4 mg via INTRAVENOUS
  Filled 2016-10-07: qty 25

## 2016-10-07 MED ORDER — POTASSIUM CHLORIDE 10 MEQ/50ML IV SOLN
10.0000 meq | Freq: Every day | INTRAVENOUS | Status: DC | PRN
  Filled 2016-10-07: qty 50

## 2016-10-07 MED ORDER — ROCURONIUM BROMIDE 100 MG/10ML IV SOLN
INTRAVENOUS | Status: DC | PRN
  Administered 2016-10-07: 30 mg via INTRAVENOUS
  Administered 2016-10-07: 50 mg via INTRAVENOUS
  Administered 2016-10-07: 30 mg via INTRAVENOUS
  Administered 2016-10-07: 10 mg via INTRAVENOUS

## 2016-10-07 MED ORDER — DEXAMETHASONE SODIUM PHOSPHATE 10 MG/ML IJ SOLN
INTRAMUSCULAR | Status: DC | PRN
  Administered 2016-10-07: 10 mg via INTRAVENOUS

## 2016-10-07 MED ORDER — MIDAZOLAM HCL 2 MG/2ML IJ SOLN
INTRAMUSCULAR | Status: AC
  Administered 2016-10-07: 1 mg via INTRAVENOUS
  Filled 2016-10-07: qty 2

## 2016-10-07 MED ORDER — SUGAMMADEX SODIUM 200 MG/2ML IV SOLN
INTRAVENOUS | Status: AC
  Filled 2016-10-07: qty 2

## 2016-10-07 MED ORDER — DICLOFENAC SODIUM 1 % TD GEL: 1.0000 g | Freq: Four times a day (QID) | TRANSDERMAL | Status: DC | PRN

## 2016-10-07 MED ORDER — FENTANYL CITRATE (PF) 100 MCG/2ML IJ SOLN
INTRAMUSCULAR | Status: AC
  Administered 2016-10-07: 50 ug via INTRAVENOUS
  Filled 2016-10-07: qty 2

## 2016-10-07 MED ORDER — MEPERIDINE HCL 25 MG/ML IJ SOLN: 6.2500 mg | INTRAMUSCULAR | Status: DC | PRN

## 2016-10-07 MED ORDER — STERILE WATER FOR IRRIGATION IR SOLN
Status: DC | PRN
  Administered 2016-10-07: 1000 mL

## 2016-10-07 MED ORDER — BUPIVACAINE 0.5 % ON-Q PUMP SINGLE CATH 400 ML
INJECTION | Status: AC | PRN
  Administered 2016-10-07: 400 mL

## 2016-10-07 MED ORDER — BUPIVACAINE HCL (PF) 0.5 % IJ SOLN
INTRAMUSCULAR | Status: AC
  Filled 2016-10-07: qty 10

## 2016-10-07 MED ORDER — OXYCODONE HCL 5 MG PO TABS
5.0000 mg | ORAL_TABLET | ORAL | Status: DC | PRN
  Administered 2016-10-07: 5 mg via ORAL
  Administered 2016-10-08: 10 mg via ORAL
  Filled 2016-10-07: qty 1
  Filled 2016-10-07: qty 2

## 2016-10-07 MED ORDER — ONDANSETRON HCL 4 MG/2ML IJ SOLN
4.0000 mg | Freq: Four times a day (QID) | INTRAMUSCULAR | Status: DC | PRN
  Administered 2016-10-11: 4 mg via INTRAVENOUS
  Filled 2016-10-07 (×2): qty 2

## 2016-10-07 MED ORDER — VANCOMYCIN HCL IN DEXTROSE 1-5 GM/200ML-% IV SOLN
1000.0000 mg | Freq: Two times a day (BID) | INTRAVENOUS | Status: AC
  Administered 2016-10-07: 1000 mg via INTRAVENOUS
  Filled 2016-10-07: qty 200

## 2016-10-07 MED ORDER — SODIUM CHLORIDE 0.9 % IV SOLN
Freq: Once | INTRAVENOUS | Status: AC
  Administered 2016-10-07 (×2): via INTRAVENOUS

## 2016-10-07 MED ORDER — EPHEDRINE SULFATE 50 MG/ML IJ SOLN
INTRAMUSCULAR | Status: DC | PRN
  Administered 2016-10-07: 10 mg via INTRAVENOUS

## 2016-10-07 MED ORDER — HEMOSTATIC AGENTS (NO CHARGE) OPTIME
TOPICAL | Status: DC | PRN
  Administered 2016-10-07: 1 via TOPICAL

## 2016-10-07 MED ORDER — ENOXAPARIN SODIUM 40 MG/0.4ML ~~LOC~~ SOLN: 40.0000 mg | Freq: Two times a day (BID) | SUBCUTANEOUS | Status: DC

## 2016-10-07 MED ORDER — FENTANYL CITRATE (PF) 100 MCG/2ML IJ SOLN
50.0000 ug | Freq: Once | INTRAMUSCULAR | Status: AC
  Administered 2016-10-07: 50 ug via INTRAVENOUS

## 2016-10-07 MED ORDER — ACETAMINOPHEN 500 MG PO TABS
1000.0000 mg | ORAL_TABLET | Freq: Four times a day (QID) | ORAL | Status: DC
  Administered 2016-10-07 – 2016-10-12 (×18): 1000 mg via ORAL
  Filled 2016-10-07 (×17): qty 2

## 2016-10-07 MED ORDER — TAMSULOSIN HCL 0.4 MG PO CAPS
0.4000 mg | ORAL_CAPSULE | Freq: Every day | ORAL | Status: DC
  Administered 2016-10-07 – 2016-10-09 (×3): 0.4 mg via ORAL
  Filled 2016-10-07 (×3): qty 1

## 2016-10-07 MED ORDER — ONDANSETRON HCL 4 MG/2ML IJ SOLN
INTRAMUSCULAR | Status: AC
  Filled 2016-10-07: qty 2

## 2016-10-07 MED ORDER — VANCOMYCIN HCL IN DEXTROSE 1-5 GM/200ML-% IV SOLN
INTRAVENOUS | Status: AC
  Filled 2016-10-07: qty 200

## 2016-10-07 MED ORDER — FINASTERIDE 5 MG PO TABS
5.0000 mg | ORAL_TABLET | Freq: Every day | ORAL | Status: DC
  Administered 2016-10-07 – 2016-10-11 (×5): 5 mg via ORAL
  Filled 2016-10-07 (×5): qty 1

## 2016-10-07 MED ORDER — PHENYLEPHRINE HCL 10 MG/ML IJ SOLN
INTRAMUSCULAR | Status: DC | PRN
  Administered 2016-10-07: 50 ug/min via INTRAVENOUS

## 2016-10-07 MED ORDER — DIPHENHYDRAMINE HCL 12.5 MG/5ML PO ELIX
12.5000 mg | ORAL_SOLUTION | Freq: Four times a day (QID) | ORAL | Status: DC | PRN
  Filled 2016-10-07: qty 5

## 2016-10-07 MED ORDER — DEXTROSE-NACL 5-0.9 % IV SOLN
INTRAVENOUS | Status: DC
  Administered 2016-10-07 – 2016-10-08 (×3): via INTRAVENOUS

## 2016-10-07 MED ORDER — SUCCINYLCHOLINE CHLORIDE 200 MG/10ML IV SOSY
PREFILLED_SYRINGE | INTRAVENOUS | Status: AC
  Filled 2016-10-07: qty 10

## 2016-10-07 MED ORDER — LACTATED RINGERS IV SOLN
INTRAVENOUS | Status: DC | PRN
  Administered 2016-10-07: 08:00:00 via INTRAVENOUS

## 2016-10-07 MED ORDER — BISACODYL 5 MG PO TBEC
10.0000 mg | DELAYED_RELEASE_TABLET | Freq: Every day | ORAL | Status: DC
  Administered 2016-10-07 – 2016-10-10 (×4): 10 mg via ORAL
  Filled 2016-10-07 (×4): qty 2

## 2016-10-07 MED ORDER — DEXAMETHASONE SODIUM PHOSPHATE 10 MG/ML IJ SOLN
INTRAMUSCULAR | Status: AC
  Filled 2016-10-07: qty 1

## 2016-10-07 MED ORDER — NALOXONE HCL 0.4 MG/ML IJ SOLN
0.4000 mg | INTRAMUSCULAR | Status: DC | PRN
  Filled 2016-10-07: qty 1

## 2016-10-07 MED ORDER — SENNOSIDES-DOCUSATE SODIUM 8.6-50 MG PO TABS
1.0000 | ORAL_TABLET | Freq: Every day | ORAL | Status: DC
  Administered 2016-10-07 – 2016-10-10 (×4): 1 via ORAL
  Filled 2016-10-07 (×4): qty 1

## 2016-10-07 MED ORDER — FENTANYL CITRATE (PF) 100 MCG/2ML IJ SOLN
INTRAMUSCULAR | Status: DC | PRN
  Administered 2016-10-07: 50 ug via INTRAVENOUS
  Administered 2016-10-07: 100 ug via INTRAVENOUS
  Administered 2016-10-07: 150 ug via INTRAVENOUS
  Administered 2016-10-07: 50 ug via INTRAVENOUS

## 2016-10-07 MED ORDER — HYDROMORPHONE 1 MG/ML IV SOLN
INTRAVENOUS | Status: DC
  Administered 2016-10-07: 13:00:00 via INTRAVENOUS

## 2016-10-07 MED ORDER — GABAPENTIN 600 MG PO TABS: 600.0000 mg | ORAL_TABLET | Freq: Two times a day (BID) | ORAL | Status: DC | PRN

## 2016-10-07 MED ORDER — BUPIVACAINE HCL (PF) 0.5 % IJ SOLN
INTRAMUSCULAR | Status: DC | PRN
  Administered 2016-10-07: 10 mL

## 2016-10-07 MED ORDER — PROMETHAZINE HCL 25 MG/ML IJ SOLN: 6.2500 mg | INTRAMUSCULAR | Status: DC | PRN

## 2016-10-07 MED ORDER — ONDANSETRON HCL 4 MG/2ML IJ SOLN
INTRAMUSCULAR | Status: DC | PRN
  Administered 2016-10-07: 4 mg via INTRAVENOUS

## 2016-10-07 SURGICAL SUPPLY — 104 items
ADH SRG 12 PREFL SYR 3 SPRDR (MISCELLANEOUS) ×2
APPLIER CLIP ROT 10 11.4 M/L (STAPLE)
APR CLP MED LRG 11.4X10 (STAPLE)
BAG SPEC RTRVL 10 TROC 200 (ENDOMECHANICALS) ×2
BAG SPEC RTRVL LRG 6X4 10 (ENDOMECHANICALS)
CANISTER SUCT 3000ML PPV (MISCELLANEOUS) ×4 IMPLANT
CATH COUDE FOLEY 2W 5CC 18FR (CATHETERS) ×2 IMPLANT
CATH KIT ON Q 5IN SLV (PAIN MANAGEMENT) IMPLANT
CATH KIT ON-Q SILVERSOAK 5 (CATHETERS) IMPLANT
CATH KIT ON-Q SILVERSOAK 5IN (CATHETERS) ×4 IMPLANT
CATH THORACIC 28FR (CATHETERS) ×2 IMPLANT
CATH THORACIC 28FR RT ANG (CATHETERS) IMPLANT
CATH THORACIC 36FR (CATHETERS) IMPLANT
CATH THORACIC 36FR RT ANG (CATHETERS) IMPLANT
CLIP APPLIE ROT 10 11.4 M/L (STAPLE) IMPLANT
CLIP VESOCCLUDE MED 6/CT (CLIP) ×4 IMPLANT
CONN ST 1/4X3/8  BEN (MISCELLANEOUS)
CONN ST 1/4X3/8 BEN (MISCELLANEOUS) IMPLANT
CONN Y 3/8X3/8X3/8  BEN (MISCELLANEOUS)
CONN Y 3/8X3/8X3/8 BEN (MISCELLANEOUS) IMPLANT
CONT SPEC 4OZ CLIKSEAL STRL BL (MISCELLANEOUS) ×24 IMPLANT
COVER BACK TABLE 60X90IN (DRAPES) ×2 IMPLANT
COVER SURGICAL LIGHT HANDLE (MISCELLANEOUS) IMPLANT
CUTTER ECHEON FLEX ENDO 45 340 (ENDOMECHANICALS) ×2 IMPLANT
DECANTER SPIKE VIAL GLASS SM (MISCELLANEOUS) ×2 IMPLANT
DRAIN CHANNEL 28F RND 3/8 FF (WOUND CARE) IMPLANT
DRAIN CHANNEL 32F RND 10.7 FF (WOUND CARE) IMPLANT
DRAPE LAPAROSCOPIC ABDOMINAL (DRAPES) ×4 IMPLANT
DRAPE WARM FLUID 44X44 (DRAPE) ×4 IMPLANT
ELECT BLADE 6.5 EXT (BLADE) ×4 IMPLANT
ELECT REM PT RETURN 9FT ADLT (ELECTROSURGICAL) ×4
ELECTRODE REM PT RTRN 9FT ADLT (ELECTROSURGICAL) ×2 IMPLANT
GAUZE SPONGE 4X4 12PLY STRL (GAUZE/BANDAGES/DRESSINGS) ×4 IMPLANT
GAUZE SPONGE 4X4 12PLY STRL LF (GAUZE/BANDAGES/DRESSINGS) ×2 IMPLANT
GLOVE BIO SURGEON STRL SZ 6.5 (GLOVE) ×1 IMPLANT
GLOVE BIO SURGEON STRL SZ7.5 (GLOVE) ×2 IMPLANT
GLOVE BIO SURGEONS STRL SZ 6.5 (GLOVE) ×1
GLOVE ECLIPSE 6.5 STRL STRAW (GLOVE) ×2 IMPLANT
GLOVE SURG SIGNA 7.5 PF LTX (GLOVE) ×8 IMPLANT
GLOVE SURG SS PI 6.5 STRL IVOR (GLOVE) ×8 IMPLANT
GOWN STRL REUS W/ TWL LRG LVL3 (GOWN DISPOSABLE) ×4 IMPLANT
GOWN STRL REUS W/ TWL XL LVL3 (GOWN DISPOSABLE) ×2 IMPLANT
GOWN STRL REUS W/TWL LRG LVL3 (GOWN DISPOSABLE) ×28
GOWN STRL REUS W/TWL XL LVL3 (GOWN DISPOSABLE) ×4
HEMOSTAT SURGICEL 2X14 (HEMOSTASIS) IMPLANT
KIT BASIN OR (CUSTOM PROCEDURE TRAY) ×4 IMPLANT
KIT ROOM TURNOVER OR (KITS) ×4 IMPLANT
KIT SUCTION CATH 14FR (SUCTIONS) IMPLANT
NS IRRIG 1000ML POUR BTL (IV SOLUTION) ×12 IMPLANT
PACK CHEST (CUSTOM PROCEDURE TRAY) ×4 IMPLANT
PAD ARMBOARD 7.5X6 YLW CONV (MISCELLANEOUS) ×8 IMPLANT
POUCH ENDO CATCH II 15MM (MISCELLANEOUS) IMPLANT
POUCH RETRIEVAL ECOSAC 10 (ENDOMECHANICALS) IMPLANT
POUCH RETRIEVAL ECOSAC 10MM (ENDOMECHANICALS) ×2
POUCH SPECIMEN RETRIEVAL 10MM (ENDOMECHANICALS) IMPLANT
RELOAD STAPLE 35X2.5 WHT THIN (STAPLE) IMPLANT
RELOAD STAPLE 45 4.1 GRN THCK (STAPLE) IMPLANT
RELOAD STAPLE 45 GOLD REG/THCK (STAPLE) IMPLANT
SCISSORS ENDO CVD 5DCS (MISCELLANEOUS) IMPLANT
SEALANT PROGEL (MISCELLANEOUS) IMPLANT
SEALANT SURG COSEAL 4ML (VASCULAR PRODUCTS) IMPLANT
SEALANT SURG COSEAL 8ML (VASCULAR PRODUCTS) IMPLANT
SET CYSTO W/LG BORE CLAMP LF (SET/KITS/TRAYS/PACK) ×2 IMPLANT
SHEARS HARMONIC ACE PLUS 36CM (ENDOMECHANICALS) IMPLANT
SHEARS HARMONIC HDI 20CM (ELECTROSURGICAL) ×2 IMPLANT
SHEARS HARMONIC HDI 36CM (ELECTROSURGICAL) IMPLANT
SOLUTION ANTI FOG 6CC (MISCELLANEOUS) ×4 IMPLANT
SPECIMEN JAR MEDIUM (MISCELLANEOUS) IMPLANT
SPONGE INTESTINAL PEANUT (DISPOSABLE) ×12 IMPLANT
SPONGE TONSIL 1 RF SGL (DISPOSABLE) ×4 IMPLANT
STAPLE RELOAD 2.5MM WHITE (STAPLE) ×16 IMPLANT
STAPLE RELOAD 45 GRN (STAPLE) ×2 IMPLANT
STAPLE RELOAD 45MM GOLD (STAPLE) ×36 IMPLANT
STAPLE RELOAD 45MM GREEN (STAPLE) ×4
STAPLER VASCULAR ECHELON 35 (CUTTER) ×2 IMPLANT
SUT PROLENE 4 0 RB 1 (SUTURE) ×4
SUT PROLENE 4-0 RB1 .5 CRCL 36 (SUTURE) IMPLANT
SUT SILK  1 MH (SUTURE) ×4
SUT SILK 1 MH (SUTURE) ×4 IMPLANT
SUT SILK 1 TIES 10X30 (SUTURE) ×4 IMPLANT
SUT SILK 2 0 SH (SUTURE) IMPLANT
SUT SILK 2 0SH CR/8 30 (SUTURE) ×2 IMPLANT
SUT SILK 3 0 SH 30 (SUTURE) IMPLANT
SUT SILK 3 0SH CR/8 30 (SUTURE) ×4 IMPLANT
SUT VIC AB 1 CTX 36 (SUTURE) ×8
SUT VIC AB 1 CTX36XBRD ANBCTR (SUTURE) ×2 IMPLANT
SUT VIC AB 2-0 CTX 36 (SUTURE) ×4 IMPLANT
SUT VIC AB 2-0 UR6 27 (SUTURE) ×2 IMPLANT
SUT VIC AB 3-0 MH 27 (SUTURE) IMPLANT
SUT VIC AB 3-0 X1 27 (SUTURE) ×4 IMPLANT
SUT VICRYL 2 TP 1 (SUTURE) ×2 IMPLANT
SYR 10ML KIT SKIN ADHESIVE (MISCELLANEOUS) ×2 IMPLANT
SYR 10ML LL (SYRINGE) ×4 IMPLANT
SYSTEM SAHARA CHEST DRAIN ATS (WOUND CARE) ×6 IMPLANT
TAPE CLOTH 4X10 WHT NS (GAUZE/BANDAGES/DRESSINGS) ×4 IMPLANT
TAPE CLOTH SURG 4X10 WHT LF (GAUZE/BANDAGES/DRESSINGS) ×2 IMPLANT
TIP APPLICATOR SPRAY EXTEND 16 (VASCULAR PRODUCTS) IMPLANT
TOWEL GREEN STERILE (TOWEL DISPOSABLE) ×4 IMPLANT
TOWEL GREEN STERILE FF (TOWEL DISPOSABLE) ×4 IMPLANT
TRAY FOLEY W/METER SILVER 16FR (SET/KITS/TRAYS/PACK) ×4 IMPLANT
TROCAR XCEL BLADELESS 5X75MML (TROCAR) ×4 IMPLANT
TUNNELER SHEATH ON-Q 11GX8 DSP (PAIN MANAGEMENT) ×2 IMPLANT
WATER STERILE IRR 1000ML POUR (IV SOLUTION) ×4 IMPLANT
WATER STERILE IRR 1000ML UROMA (IV SOLUTION) ×2 IMPLANT

## 2016-10-07 NOTE — Brief Op Note (Addendum)
10/07/2016  12:12 PM  PATIENT:  Raymond Logan  83 y.o. male  PRE-OPERATIVE DIAGNOSIS:  NON SMALL CELL CARCINOMA RML  POST-OPERATIVE DIAGNOSIS:  NON SMALL CELL CARCINOMA RML  PROCEDURE:  Procedure(s):  RIGHT VIDEO ASSISTED THORACOSCOPY  -Right Middle Lobectomy -Mediastinal Lymph Node Dissection -Insertion of ON-Q Anesthetic Catheter  CYSTOSCOPY (N/A)  FOLEY  CATHETER PLACEMENT (N/A)  SURGEON:  Surgeon(s) and Role: Panel 1:    * [name], MD - Primary  Panel 2:    * Festus Aloe, MD - Primary  PHYSICIAN ASSISTANT: Ellwood Handler PA-C  ANESTHESIA:   general  EBL:  Total I/O In: 1400 [I.V.:1400] Out: 1075 [Urine:875; Blood:200]  BLOOD ADMINISTERED:none  DRAINS: 28 Straight Chest Tube Right Chest   LOCAL MEDICATIONS USED:  MARCAINE     SPECIMEN:  Source of Specimen:  Right Middle Lobe, Lymph Nodes  DISPOSITION OF SPECIMEN:  PATHOLOGY  COUNTS:  YES  PLAN OF CARE: Admit to inpatient   PATIENT DISPOSITION:  ICU - extubated and stable.   Delay start of Pharmacological VTE agent (>24hrs) due to surgical blood loss or risk of bleeding: yes  Bronchial margin negative for tumor. Tumor seen in tissue adjacent to a lymph node on the staple line. Additional margin at that area negative for tumor

## 2016-10-07 NOTE — Anesthesia Procedure Notes (Signed)
Procedure Name: Intubation Date/Time: 10/07/2016 8:22 AM Performed by: Rejeana Brock L Pre-anesthesia Checklist: Patient identified, Emergency Drugs available, Suction available and Patient being monitored Patient Re-evaluated:Patient Re-evaluated prior to induction Oxygen Delivery Method: Circle System Utilized Preoxygenation: Pre-oxygenation with 100% oxygen Induction Type: IV induction Ventilation: Mask ventilation without difficulty Laryngoscope Size: Mac and 4 Grade View: Grade II Tube type: Oral Endobronchial tube: Double lumen EBT, EBT position confirmed by auscultation and EBT position confirmed by fiberoptic bronchoscope and 39 Fr Number of attempts: 1 Airway Equipment and Method: Stylet and Oral airway Placement Confirmation: ETT inserted through vocal cords under direct vision,  positive ETCO2 and breath sounds checked- equal and bilateral Secured at: 30 cm Tube secured with: Tape Dental Injury: Teeth and Oropharynx as per pre-operative assessment

## 2016-10-07 NOTE — Anesthesia Procedure Notes (Signed)
Arterial Line Insertion Start/End8/10/2016 7:20 AM, 10/07/2016 7:38 AM Performed by: Lelon Perla A, CRNA  Patient location: Pre-op. Preanesthetic checklist: patient identified, IV checked, surgical consent and monitors and equipment checked Patient sedated Left, radial was placed Catheter size: 20 G Hand hygiene performed  and maximum sterile barriers used   Attempts: 1 Procedure performed without using ultrasound guided technique. Following insertion, Biopatch and dressing applied. Post procedure assessment: unchanged  Patient tolerated the procedure well with no immediate complications.

## 2016-10-07 NOTE — Consult Note (Addendum)
Consult: BPH Requested by: Dr. Roxan Hockey  History of Present Illness: 83 yo male scheduled for right VATS and middle lobectomy today with a history of BPH, urinary retention and difficult foley. He continues Rapaflo and finasteride and is voiding with a good stream. He's had no recent issues with retention or dysuria but was treated for a UA in June with bacteruria with Cipro. No Cx done. F/u Cx no growth and UA looked much cleaner. Pre-op UA shows 6-30 wbc's and few bacteria. Pt without dysuria.   Past Medical History:  Diagnosis Date  . ALLERGIC RHINITIS 01/23/2007  . Arthritis    hands  . BENIGN PROSTATIC HYPERTROPHY 09/10/2006  . Chronic headaches   . Complication of anesthesia    difficulty voiding- if cath needed needs to placed with need to be done with scope  . COPD, MILD 04/03/2010   patient denies on preop visit of 08/02/2014   . Difficulty in urination   . FATIGUE 01/08/2008  . GERD 04/15/2010  . H. pylori infection    hx of   . Headache(784.0) 09/10/2006  . HOARSENESS 04/15/2010  . HYPERLIPIDEMIA 09/10/2006  . INSOMNIA-SLEEP DISORDER-UNSPEC 04/24/2009  . PEPTIC ULCER DISEASE 01/23/2007  . Pneumonia    1955   Past Surgical History:  Procedure Laterality Date  . CATARACT EXTRACTION Left   . COLONOSCOPY    . HEMORRHOID BANDING    . KNEE ARTHROSCOPY Left    torn meniscus  . LUMBAR LAMINECTOMY/DECOMPRESSION MICRODISCECTOMY Right 08/07/2014   Procedure: complete DECOMPRESSION LUMBAR LAMINECTOMY/MICRODISCECTOMY OF L4-L5 for spinal stenosis, DECOMPRESSION OF L3-L4;  Surgeon: Latanya Maudlin, MD;  Location: [facility];  Service: Orthopedics;  Laterality: Right;  . ROTATOR CUFF REPAIR Right   . SHOULDER ARTHROSCOPY     x3, L & R  . VIDEO BRONCHOSCOPY WITH ENDOBRONCHIAL NAVIGATION N/A 09/23/2016   Procedure: VIDEO BRONCHOSCOPY WITH ENDOBRONCHIAL NAVIGATION;  Surgeon: [name], MD;  Location: [place];  Service: Thoracic;  Laterality: N/A;  . VIDEO BRONCHOSCOPY WITH ENDOBRONCHIAL  ULTRASOUND N/A 09/23/2016   Procedure: VIDEO BRONCHOSCOPY WITH ENDOBRONCHIAL ULTRASOUND;  Surgeon: [name], MD;  Location: [place];  Service: Thoracic;  Laterality: N/A;    Home Medications:  Prescriptions Prior to Admission  Medication Sig Dispense Refill Last Dose  . aspirin EC 325 MG tablet Take 325-650 mg by mouth every 6 (six) hours as needed (for pain.).    Past Month at Unknown time  . aspirin-acetaminophen-caffeine (EXCEDRIN MIGRAINE) 250-250-65 MG tablet Take 2 tablets by mouth daily as needed for headache or migraine.   Past Month at Unknown time  . Cal Carb-Mag Hydrox-Simeth (ROLAIDS ADVANCED) 1000-200-40 MG CHEW Chew 1 tablet by mouth 3 (three) times daily as needed (for acid reflux).   10/06/2016 at Unknown time  . calcium carbonate (TUMS - DOSED IN MG ELEMENTAL CALCIUM) 500 MG chewable tablet Chew 1 tablet by mouth 3 (three) times daily as needed (for acid reflux.).   Past Week at Unknown time  . diclofenac sodium (VOLTAREN) 1 % GEL Apply 1-2 g topically 4 (four) times daily as needed (for knee pain.).   Past Month at Unknown time  . eszopiclone (LUNESTA) 2 MG TABS tablet Take 1 tablet (2 mg total) by mouth at bedtime as needed for sleep. Take immediately before bedtime 90 tablet 1 Past Week at Unknown time  . finasteride (PROSCAR) 5 MG tablet Take 5 mg by mouth at bedtime.    10/06/2016 at Unknown time  . gabapentin (NEURONTIN) 600 MG tablet Take  600 mg by mouth 2 (two) times daily as needed (for pain (typically once daily in the morning)).   0 Past Week at Unknown time  . silodosin (RAPAFLO) 8 MG CAPS capsule Take 8 mg by mouth at bedtime.    10/06/2016 at Unknown time   Allergies:  Allergies  Allergen Reactions  . Alfuzosin Other (See Comments)    BP went crazy, dizzy, passing out   . Aricept [Donepezil Hcl] Other (See Comments)    Insomnia, headache  . Penicillins Hives    PATIENT HAS HAD A PCN REACTION WITH IMMEDIATE RASH, FACIAL/TONGUE/THROAT SWELLING, SOB, OR  LIGHTHEADEDNESS WITH HYPOTENSION:  #  #  #  YES  #  #  #   Has patient had a PCN reaction causing severe rash involving mucus membranes or skin necrosis:No Has patient had a PCN reaction that required hospitalization:No Has patient had a PCN reaction occurring within the last 10 years:No If all of the above answers are "NO", then may proceed with Cephalosporin use.     Family History  Problem Relation Age of Onset  . Prostate cancer Brother   . Alzheimer's disease Mother   . Alzheimer's disease Sister    Social History:  reports that he quit smoking about 40 years ago. He has a 23.00 pack-year smoking history. He has never used smokeless tobacco. He reports that he does not drink alcohol or use drugs.  ROS: A complete review of systems was performed.  All systems are negative except for pertinent findings as noted. Review of Systems  Gastrointestinal: Positive for heartburn.  All other systems reviewed and are negative.    Physical Exam:  Vital signs in last 24 hours: Temp:  [97.6 F (36.4 C)] 97.6 F (36.4 C) (08/09 0704) Pulse Rate:  [53] 53 (08/09 0704) Resp:  [18] 18 (08/09 0704) BP: (152)/(69) 152/69 (08/09 0704) SpO2:  [100 %] 100 % (08/09 0704) Weight:  [72.1 kg (159 lb)] 72.1 kg (159 lb) (08/09 0704) General:  Alert and oriented, No acute distress HEENT: Normocephalic, atraumatic Cardiovascular: Regular rate and rhythm Lungs: Regular rate and effort Abdomen: Soft, nontender, nondistended, no abdominal masses Extremities: No edema Neurologic: Grossly intact  Laboratory Data:  No results found for this or any previous visit (from the past 24 hour(s)). Recent Results (from the past 240 hour(s))  Surgical pcr screen     Status: None   Collection Time: 10/05/16  9:34 AM  Result Value Ref Range Status   MRSA, PCR NEGATIVE NEGATIVE Final   Staphylococcus aureus NEGATIVE NEGATIVE Final    Comment:        The Xpert SA Assay (FDA approved for NASAL specimens in  patients over 73 years of age), is one component of a comprehensive surveillance program.  Test performance has been validated by [facility] for patients greater than or equal to 24 year old. It is not intended to diagnose infection nor to guide or monitor treatment.    Creatinine:  Recent Labs  10/05/16 0934  CREATININE 0.96    Impression/Assessment/plan: BPH - h/o retention and difficult foley placement. I discussed with the patient the nature, potential benefits, risks and alternatives to cystoscopy, exam under anesthesia and foley placement, including side effects of the proposed treatment, the likelihood of the patient achieving the goals of the procedure, and any potential problems that might occur during the procedure or recuperation. All questions answered. Patient elects to proceed.    Esker Dever 10/07/2016, 7:38 AM

## 2016-10-07 NOTE — Anesthesia Preprocedure Evaluation (Signed)
Anesthesia Evaluation  Patient identified by MRN, date of birth, ID band Patient awake    Reviewed: Allergy & Precautions, H&P , NPO status , Patient's Chart, lab work & pertinent test results, reviewed documented beta blocker date and time   Airway Mallampati: II  TM Distance: >3 FB Neck ROM: full    Dental no notable dental hx. (+) Caps, Dental Advisory Given   Pulmonary pneumonia, COPD, former smoker,    Pulmonary exam normal breath sounds clear to auscultation       Cardiovascular  Rhythm:regular Rate:Normal     Neuro/Psych  Headaches, negative psych ROS   GI/Hepatic Neg liver ROS, PUD, GERD  ,  Endo/Other  negative endocrine ROS  Renal/GU negative Renal ROS     Musculoskeletal  (+) Arthritis ,   Abdominal   Peds  Hematology negative hematology ROS (+)   Anesthesia Other Findings   Reproductive/Obstetrics negative OB ROS                             Anesthesia Physical  Anesthesia Plan  ASA: III  Anesthesia Plan: General   Post-op Pain Management:    Induction: Intravenous  PONV Risk Score and Plan: 2 and Ondansetron, Dexamethasone, Propofol and Treatment may vary due to age or medical condition  Airway Management Planned: Double Lumen EBT  Additional Equipment: Arterial line, CVP and Ultrasound Guidance Line Placement  Intra-op Plan:   Post-operative Plan: Extubation in OR  Informed Consent: I have reviewed the patients History and Physical, chart, labs and discussed the procedure including the risks, benefits and alternatives for the proposed anesthesia with the patient or authorized representative who has indicated his/her understanding and acceptance.   Dental advisory given  Plan Discussed with: CRNA  Anesthesia Plan Comments: (  )        Anesthesia Quick Evaluation                                   Anesthesia Evaluation  Patient identified by MRN, date  of birth, ID band Patient awake    Reviewed: Allergy & Precautions, H&P , Patient's Chart, lab work & pertinent test results, reviewed documented beta blocker date and time   Airway Mallampati: II  TM Distance: >3 FB Neck ROM: full    Dental no notable dental hx. (+) Caps, Dental Advisory Given   Pulmonary former smoker,    Pulmonary exam normal breath sounds clear to auscultation       Cardiovascular  Rhythm:regular Rate:Normal     Neuro/Psych    GI/Hepatic   Endo/Other    Renal/GU      Musculoskeletal   Abdominal   Peds  Hematology   Anesthesia Other Findings   Reproductive/Obstetrics                            Anesthesia Physical Anesthesia Plan  ASA: III  Anesthesia Plan: General   Post-op Pain Management:    Induction: Intravenous  PONV Risk Score and Plan: 2 and Ondansetron, Dexamethasone, Propofol and Treatment may vary due to age or medical condition  Airway Management Planned: Oral ETT  Additional Equipment:   Intra-op Plan:   Post-operative Plan: Extubation in OR  Informed Consent: I have reviewed the patients History and Physical, chart, labs and discussed the procedure including the risks, benefits and alternatives for  the proposed anesthesia with the patient or authorized representative who has indicated his/her understanding and acceptance.   Dental Advisory Given  Plan Discussed with: CRNA and Surgeon  Anesthesia Plan Comments: (  )        Anesthesia Quick Evaluation

## 2016-10-07 NOTE — Interval H&P Note (Signed)
History and Physical Interval Note: From last week   Raymond Logan returns to discuss the results of his navigational bronchoscopy and endobronchial ultrasound.  He is an 83 year old gentleman with a past medical history of remote tobacco abuse, hypokalemia, gastroesophageal reflux, arthritis, and BPH. He was being evaluated for a total knee replacement. His preoperative chest x-ray showed a right lung nodule. CT of the chest showed a 3 x 2.3 x 2.9 cm spiculated nodule in the right middle lobe. The nodule is mildly hypermetabolic on PET/CT. There was hypermetabolic activity in both hilar regions associated with small lymph nodes. There is no activity in the mediastinum and no evidence of distant metastases..  I did a navigational bronchoscopy and endobronchial ultrasound on 09/23/2016. The lymph node aspirations showed no evidence of tumor. Brushings from the right middle lobe showed non-small cell carcinoma.  Raymond Logan appears to have stage I non-small cell carcinoma of the right middle lobe. I discussed the options of surgical resection versus radiation. We discussed the relative advantages and disadvantages of each of those approaches.  I described the proposed operation to him. We will plan to do a right VATS and middle lobectomy. He understands general nature of the procedure, the incisions to be used, the general anesthesia, the use of the drainage tube postoperative, the expected hospital stay, and the overall recovery. I reviewed the indications, risks, benefits, and alternatives. He understands the risks include, but are not limited to death, MI, DVT, PE, bleeding, possible need for transfusion, stroke, infection, cardiac arrhythmias, prolonged air leaks, as well as possibility of other unforeseeable complications.  His daughter Raymond Logan listened in on the phone during the consultation.  He wishes to proceed with right middle lobectomy.  Plan  Right VATS for middle lobectomy on  Thursday, 10/07/2016. 10/07/2016 6:52 AM  Raymond Logan  has presented today for surgery, with the diagnosis of NON SMALL CELL CARCINOMA RML  The various methods of treatment have been discussed with the patient and family. After consideration of risks, benefits and other options for treatment, the patient has consented to  Procedure(s): VIDEO ASSISTED THORACOSCOPY (VATS)/RIGHT MIDDLE LOBECTOMY (Right) CYSTOSCOPY (N/A) FOLEY  CATHETER PLACEMENT (N/A) as a surgical intervention .  The patient's history has been reviewed, patient examined, no change in status, stable for surgery.  I have reviewed the patient's chart and labs.  Questions were answered to the patient's satisfaction.     Raymond Logan

## 2016-10-07 NOTE — Anesthesia Postprocedure Evaluation (Signed)
Anesthesia Post Note  Patient: Raymond Logan  Procedure(s) Performed: Procedure(s) (LRB): VIDEO ASSISTED THORACOSCOPY (VATS)/RIGHT MIDDLE LOBECTOMY (Right) CYSTOSCOPY (N/A) FOLEY  CATHETER PLACEMENT (N/A)     Patient location during evaluation: PACU Anesthesia Type: General Level of consciousness: sedated and patient cooperative Pain management: pain level controlled Vital Signs Assessment: post-procedure vital signs reviewed and stable Respiratory status: spontaneous breathing Cardiovascular status: stable Anesthetic complications: no    Last Vitals:  Vitals:   10/07/16 1500 10/07/16 1700  BP: (!) 142/64   Pulse: (!) 50   Resp: (!) 8 11  Temp:    SpO2: 100%     Last Pain:  Vitals:   10/07/16 1615  TempSrc:   PainSc: [name]

## 2016-10-07 NOTE — Anesthesia Procedure Notes (Signed)
Central Venous Catheter Insertion Performed by: Nolon Nations, anesthesiologist Patient location: Pre-op. Preanesthetic checklist: patient identified, IV checked, site marked, risks and benefits discussed, surgical consent, monitors and equipment checked, pre-op evaluation, timeout performed and anesthesia consent Position: Trendelenburg Lidocaine 1% used for infiltration and patient sedated Hand hygiene performed , maximum sterile barriers used  and Seldinger technique used Catheter size: 8 Fr Total catheter length 16. Central line was placed.Double lumen Procedure performed using ultrasound guided technique. Ultrasound Notes:anatomy identified, needle tip was noted to be adjacent to the nerve/plexus identified, no ultrasound evidence of intravascular and/or intraneural injection and image(s) printed for medical record Attempts: 1 Following insertion, dressing applied, line sutured and Biopatch. Post procedure assessment: blood return through all ports, free fluid flow and no air  Patient tolerated the procedure well with no immediate complications.

## 2016-10-07 NOTE — Op Note (Signed)
Preoperative diagnosis: BPH Postoperative diagnosis: BPH  Procedure: Exam under anesthesia, cystoscopy, Foley catheter placement  Surgeon: Junious Silk  Anesthesia: Gen.  Indication for procedure: 83 year old with history of BPH, urinary retention a difficult Foley who is scheduled for right VATS and middle lobectomy. Urology consulted for Foley placement.  Findings: On  Exam under anesthesia the penis was uncircumcised with a normal foreskin, no lesion or phimosis.he patient was frog legged and on digital rectal exam the prostate was smooth without hard area or nodule. All landmarks were preserved. Prostate broad and about 75 g.  On cystoscopy the urethra was normal, the prostatic urethra was elongated and visually obstructed by lateral lobe hypertrophy. There was no stricture or false passage. The trigone and ureteral orifices were difficult to see because of an elevated bladder neck and some friability of the prostate which cause mild bleeding when trying to visualize the trigone. There were no stones or foreign  In the bladder. There was mild trabeculation. The bladder mucosa appeared normal. On retroflexion the bladder appeared normal, prostate protruded into the bladder for 1-2 cm.   Description of procedure: After consent was obtained the patient was brought to the operating room and placed supine.After adequate anesthesia a timeout was performed to confirm the patient and procedure. An exam under anesthesia was performed. He was prepped and draped in the usual sterile fashion. The flexible cystoscope was passed per urethra and the urethra and bladder inspected. An 68 French coud catheter was placed without difficulty. This was left to gravity drainage draining clear urine. The patient was turned over to the care of Dr. Roxan Hockey for his portion of the procedure.   Complications: none Blood loss: none Specimens: none Drains: 18 Fr coude catheter  Disposition: to Dr. Roxan Hockey for his  procedure

## 2016-10-07 NOTE — Transfer of Care (Signed)
Immediate Anesthesia Transfer of Care Note  Patient: Raymond Logan  Procedure(s) Performed: Procedure(s): VIDEO ASSISTED THORACOSCOPY (VATS)/RIGHT MIDDLE LOBECTOMY (Right) CYSTOSCOPY (N/A) FOLEY  CATHETER PLACEMENT (N/A)  Patient Location: PACU  Anesthesia Type:General  Level of Consciousness: awake  Airway & Oxygen Therapy: Patient Spontanous Breathing and Patient connected to face mask oxygen  Post-op Assessment: Report given to RN, Post -op Vital signs reviewed and stable and Patient moving all extremities X 4  Post vital signs: Reviewed and stable  Last Vitals:  Vitals:   10/07/16 0704  BP: (!) 152/69  Pulse: (!) 53  Resp: 18  Temp: 36.4 C  SpO2: 100%    Last Pain:  Vitals:   10/07/16 0708  TempSrc:   PainSc: 0-No pain      Patients Stated Pain Goal: 3 (09/38/18 2993)  Complications: No apparent anesthesia complications

## 2016-10-07 NOTE — H&P (View-Only) (Signed)
PCP is Biagio Borg, MD Referring Provider is Tanda Rockers, MD  Chief Complaint  Patient presents with  . Lung Mass    Surgical eval, Chest CT 08/17/16, PET Scan 08/26/16, Spirometry with Graph 08/26/16    HPI: Raymond Logan is sent for consultation regarding a right middle lobe lesion.  [name] is a an 83 year old gentleman with a past medical history significant for remote tobacco abuse (quit 40 years ago), benign prostatic hypertrophy, hyperlipidemia, gastroesophageal reflux and arthritis. He recently was being evaluated for a total knee replacement by Dr. Mayer Camel. As part of his preop evaluation he had a chest x-ray which showed a right lung nodule. A CT of the chest was done. It showed a 3.0 x 2.3 x 2.9 cm spiculated nodule in the right middle lobe. There was no hilar or mediastinal adenopathy. A PET CT showed the mass was mildly hypermetabolic with an SUV of 3.1. There was hypermetabolic activity in both hilar regions, 5.8 on the right and 4.9 on the left, associated with small lymph nodes. There was no mediastinal activity and no evidence of distant metastases.  Mr. Dommer remains very active. He was playing full court basketball up until his knee problems became too severe to continue. He has no history of cardiac disease. He denies any chest pain, pressure, or tightness at rest or with exertion. He does have prostate enlargement and a history of difficult catheterization. He has chronic migraine headaches, feels those studies off recently. He does take Tums about once a day for indigestion. Zubrod Score: At the time of surgery this patient's most appropriate activity status/level should be described as: [x]     0    Normal activity, no symptoms []     1    Restricted in physical strenuous activity but ambulatory, able to do out light work []     2    Ambulatory and capable of self care, unable to do work activities, up and about >50 % of waking hours                              []     3     Only limited self care, in bed greater than 50% of waking hours []     4    Completely disabled, no self care, confined to bed or chair []     5    Moribund   Past Medical History:  Diagnosis Date  . ALLERGIC RHINITIS 01/23/2007  . Arthritis   . BENIGN PROSTATIC HYPERTROPHY 09/10/2006  . Chronic headaches   . COPD, MILD 04/03/2010   patient denies on preop visit of 08/02/2014   . Difficulty in urination   . FATIGUE 01/08/2008  . GERD 04/15/2010  . H. pylori infection    hx of   . Headache(784.0) 09/10/2006  . HOARSENESS 04/15/2010  . HYPERLIPIDEMIA 09/10/2006  . INSOMNIA-SLEEP DISORDER-UNSPEC 04/24/2009  . PEPTIC ULCER DISEASE 01/23/2007    Past Surgical History:  Procedure Laterality Date  . CATARACT EXTRACTION    . HEMORRHOID BANDING    . JOINT REPLACEMENT    . KNEE ARTHROSCOPY    . LUMBAR LAMINECTOMY/DECOMPRESSION MICRODISCECTOMY Right 08/07/2014   Procedure: complete DECOMPRESSION LUMBAR LAMINECTOMY/MICRODISCECTOMY OF L4-L5 for spinal stenosis, DECOMPRESSION OF L3-L4;  Surgeon: Latanya Maudlin, MD;  Location: [facility];  Service: Orthopedics;  Laterality: Right;  . SHOULDER ARTHROSCOPY     x3, L & R    Family History  Problem  Relation Age of Onset  . Prostate cancer Brother   . Alzheimer's disease Mother   . Alzheimer's disease Sister     Social History Social History  Substance Use Topics  . Smoking status: Former Smoker    Packs/day: 1.00    Years: 23.00    Quit date: 18  . Smokeless tobacco: Never Used  . Alcohol use No     Comment: rare    Current Outpatient Prescriptions  Medication Sig Dispense Refill  . aspirin EC 325 MG tablet Take 325 mg by mouth every 6 (six) hours as needed (Only takes if gabapentin does not work for headaches).     . eszopiclone (LUNESTA) 2 MG TABS tablet Take 1 tablet (2 mg total) by mouth at bedtime as needed for sleep. Take immediately before bedtime 90 tablet 1  . finasteride (PROSCAR) 5 MG tablet Take 5 mg by mouth daily.    [name]  gabapentin (NEURONTIN) 100 MG capsule Take 1 capsule by mouth 2 (two) times daily as needed (headache).   0  . gabapentin (NEURONTIN) 600 MG tablet Take 600 mg by mouth 2 (two) times daily as needed.   0  . silodosin (RAPAFLO) 8 MG CAPS capsule Take 8 mg by mouth daily with breakfast.      No current facility-administered medications for this visit.     Allergies  Allergen Reactions  . Alfuzosin Other (See Comments)    BP went crazy, dizzy, passing out   . Aricept [Donepezil Hcl] Other (See Comments)    Insomnia, headache  . Penicillins Hives         Review of Systems  Constitutional: Negative for activity change, chills, fatigue, fever and unexpected weight change.  HENT: Negative for trouble swallowing and voice change.   Respiratory: Negative for cough, chest tightness, shortness of breath and wheezing.   Cardiovascular: Negative for chest pain and leg swelling.  Gastrointestinal: Positive for abdominal pain (Heartburn). Negative for blood in stool.  Genitourinary: Positive for frequency and urgency.       Enlarged prostate, difficult to catheterize, followed by [facility]  Musculoskeletal: Positive for arthralgias and joint swelling.  Neurological: Positive for headaches. Negative for dizziness, syncope and weakness.       Episode of dysarthria associated with severe migraine in March 2018  Hematological: Negative for adenopathy. Does not bruise/bleed easily.  All other systems reviewed and are negative.   BP (!) 144/77   Pulse (!) 56   Resp 20   Ht 5\' 9"  (1.753 m)   Wt 155 lb (70.3 kg)   SpO2 97% Comment: RA  BMI 22.89 kg/m  Physical Exam  Constitutional: He is oriented to person, place, and time. He appears well-developed and well-nourished. No distress.  HENT:  Head: Normocephalic and atraumatic.  Mouth/Throat: No oropharyngeal exudate.  Eyes: Conjunctivae and EOM are normal. No scleral icterus.  Neck: No thyromegaly present.  Cardiovascular: Normal rate,  regular rhythm and normal heart sounds.  Exam reveals no gallop and no friction rub.   No murmur heard. Pulmonary/Chest: No respiratory distress. He has no wheezes. He has no rales.  Abdominal: Soft. Bowel sounds are normal. He exhibits no distension. There is no tenderness.  Musculoskeletal: He exhibits deformity (Swelling left knee). He exhibits no edema.  Lymphadenopathy:    He has no cervical adenopathy.  Neurological: He is alert and oriented to person, place, and time. No cranial nerve deficit. He exhibits normal muscle tone.  No motor deficit  Skin: Skin is  warm and dry.  Vitals reviewed.    Diagnostic Tests: CT CHEST WITH CONTRAST  TECHNIQUE: Multidetector CT imaging of the chest was performed during intravenous contrast administration. Sagittal and coronal MPR images reconstructed from axial data set.  CONTRAST:  28mL ISOVUE-300 IOPAMIDOL (ISOVUE-300) INJECTION 61% IV  COMPARISON:  None; correlation chest radiograph 08/12/2016  FINDINGS: Cardiovascular: Atherosclerotic calcifications aorta. Aorta normal caliber without aneurysmal dilatation. No pericardial effusion. Pulmonary arteries grossly patent on nondedicated exam. Azygosvein noted within an azygous fissure.  Mediastinum/Nodes: Esophagus unremarkable. Nonspecific 5 mm RIGHT thyroid nodule. Base of cervical region otherwise normal appearance. Scattered normal sized mediastinal and hilar lymph nodes.  Lungs/Pleura: Spiculated mass centrally in RIGHT middle lobe 3.0 x 2.3 x 2.9 cm compatible with pulmonary malignancy. Scattered interstitial thickening in the periphery of the RIGHT lower lobe inferiorly. Remaining lungs clear. No pulmonary infiltrate, pleural effusion or pneumothorax. No additional mass/ nodule.  Upper Abdomen: Mild cortical scarring at upper pole LEFT kidney. Remaining visualized upper abdomen unremarkable.  Musculoskeletal: No acute osseous findings.  IMPRESSION: Spiculated  central RIGHT middle lobe mass 3.0 x 2.3 x 2.9 cm compatible with pulmonary malignancy.  No definite thoracic adenopathy or pulmonary metastases identified.  Nonspecific interstitial thickening in the periphery of the inferior RIGHT lower lobe.  Aortic Atherosclerosis (ICD10-I70.0).   Electronically Signed   By: Lavonia Dana M.D.   On: 08/17/2016 13:20 NUCLEAR MEDICINE PET SKULL BASE TO THIGH  TECHNIQUE: 8.29 mCi F-18 FDG was injected intravenously. Full-ring PET imaging was performed from the skull base to thigh after the radiotracer. CT data was obtained and used for attenuation correction and anatomic localization.  FASTING BLOOD GLUCOSE:  Value: 94 mg/dl  COMPARISON:  Chest CT 08/17/2016  FINDINGS: NECK  No hypermetabolic lymph nodes in the neck.  CHEST  3.5 x 2.1 cm right middle lobe lesion extending along a right middle lobe bronchus is weakly hypermetabolic with SUV max of 3.1. Small hilar lymph node is hypermetabolic with SUV max of 5.8. There is also a small left hilar lymph which is hypermetabolic with SUV max of 4.9.  No other lung lesions are identified. No other areas of hypermetabolism. No pleural effusion.  ABDOMEN/PELVIS  No abnormal hypermetabolic activity within the liver, pancreas, adrenal glands, or spleen. No hypermetabolic lymph nodes in the abdomen or pelvis. New line additional CT findings include bilateral renal cysts. Probable chronic left UPJ obstruction, advanced atherosclerotic calcifications involving the aorta iliac arteries with a remote calcified distal aortic dissection. Markedly enlarged prostate gland is noted with median lobe hypertrophy impressing on the base of the bladder.  SKELETON  No focal hypermetabolic activity to suggest skeletal metastasis.  IMPRESSION: 1. Elongated right middle lobe lung lesion is weakly hypermetabolic but still suspicious for neoplasm. It is sub solid appearance may suggest  adenocarcinoma with FDG positive bilateral hilar lymph nodes. This lesion should be amenable to bronchoscopic biopsy as it is surrounding a right middle lobe bronchus. 2. No findings for metastatic disease involving the abdomen/pelvis, neck or supraclavicular regions. No findings for osseous metastatic disease.   Electronically Signed   By: Marijo Sanes M.D.   On: 08/25/2016 16:24 I personally reviewed the CT and PET/CT and concur with the findings noted above.  Impression: Mr. Hofferber is an 83 year old gentleman who was found to have a right middle lobe nodule on preoperative evaluation prior to knee replacement. He has a remote history of tobacco abuse less than 20 pack years and quit over 40 years ago. The nodule is  sub-solid , spiculated, and mildly hypermetabolic on CT. This would most likely be a low-grade adenocarcinoma, although infectious or inflammatory etiologies are also within the differential diagnosis.  It is hard to know what to make of the bilateral hilar activity on PET/CT. There are no pathologically enlarged lymph nodes to account for that. It's fairly symmetrical. Contralateral hilar metastasis without mediastinal involvement would be unusual. I do think we should at least investigate that before we proceed with surgical resection, but I doubt it represents metastatic disease.  I recommended to Mr. Fisch that we do an avid patient will bronchoscopy and endobronchial ultrasound to evaluate with the primary lesion in the middle lobe as well as the hilar nodes. Reason for this is that at the left hilar nodes were involved, surgery would not be beneficial for him. I described the procedure to Mr. Toman and his 2 daughters who accompanied him. This will be done in the operating room under general anesthesia on an outpatient basis. There is risk associated with procedures under general anesthesia with rare incidences of MI, stroke, blood clots, or even death. I think  those are all very unlikely in this case given his overall general good health. There is a risk of bleeding or pneumothorax with the biopsies and there also is a risk of failure to make a definitive diagnosis.  He does have a history of urinary retention and is difficult to catheterize. He is followed by [facility]. If he has issues with urinary retention we will consult them postoperatively.  One of his daughters who accompanied him was very upset about the possibility of him having a procedure done at [facility] and would like him to go to [facility]. She requested that his records be sent to do it did not have a physician's name. We will be happy to send his records if Mr. Hruska would like [place] to.   Plan: Navigational bronchoscopy and endobronchial ultrasound on Friday, 09/24/2016  [name], MD [facility] [phone]

## 2016-10-07 NOTE — Progress Notes (Signed)
CTS PM Rounds   S/P R lobectomy OOB to chair Mild air leak Stable vitals

## 2016-10-08 ENCOUNTER — Encounter (HOSPITAL_COMMUNITY): Payer: Self-pay | Admitting: Thoracic Surgery (Cardiothoracic Vascular Surgery)

## 2016-10-08 ENCOUNTER — Inpatient Hospital Stay (HOSPITAL_COMMUNITY): Payer: Medicare Other

## 2016-10-08 LAB — BLOOD GAS, ARTERIAL
Acid-base deficit: 3.1 mmol/L — ABNORMAL HIGH (ref 0.0–2.0)
BICARBONATE: 21.8 mmol/L (ref 20.0–28.0)
Drawn by: 51133
O2 SAT: 94.6 %
PATIENT TEMPERATURE: 98.6
PCO2 ART: 42 mmHg (ref 32.0–48.0)
PO2 ART: 74.5 mmHg — AB (ref 83.0–108.0)
pH, Arterial: 7.336 — ABNORMAL LOW (ref 7.350–7.450)

## 2016-10-08 LAB — BASIC METABOLIC PANEL
Anion gap: 7 (ref 5–15)
BUN: 21 mg/dL — AB (ref 6–20)
CHLORIDE: 110 mmol/L (ref 101–111)
CO2: 23 mmol/L (ref 22–32)
CREATININE: 1.12 mg/dL (ref 0.61–1.24)
Calcium: 7.8 mg/dL — ABNORMAL LOW (ref 8.9–10.3)
GFR calc Af Amer: >60 mL/min (ref >60)
GFR calc non Af Amer: 58 mL/min — ABNORMAL LOW (ref >60)
Glucose, Bld: 168 mg/dL — ABNORMAL HIGH (ref 65–99)
POTASSIUM: 4.6 mmol/L (ref 3.5–5.1)
Sodium: 140 mmol/L (ref 135–145)

## 2016-10-08 LAB — CBC
HEMATOCRIT: 33.1 % — AB (ref 39.0–52.0)
HEMOGLOBIN: 11.2 g/dL — AB (ref 13.0–17.0)
MCH: 30.2 pg (ref 26.0–34.0)
MCHC: 33.8 g/dL (ref 30.0–36.0)
MCV: 89.2 fL (ref 78.0–100.0)
Platelets: 197 10*3/uL (ref 150–400)
RBC: 3.71 MIL/uL — ABNORMAL LOW (ref 4.22–5.81)
RDW: 12.8 % (ref 11.5–15.5)
WBC: 7.9 10*3/uL (ref 4.0–10.5)

## 2016-10-08 MED ORDER — ZOLPIDEM TARTRATE 5 MG PO TABS: 5.0000 mg | ORAL_TABLET | Freq: Every evening | ORAL | Status: DC | PRN

## 2016-10-08 MED ORDER — POTASSIUM CHLORIDE IN NACL 20-0.45 MEQ/L-% IV SOLN
INTRAVENOUS | Status: DC
  Administered 2016-10-08: 75 mL/h via INTRAVENOUS
  Administered 2016-10-12: 06:00:00 via INTRAVENOUS
  Filled 2016-10-08 (×3): qty 1000

## 2016-10-08 MED ORDER — METOCLOPRAMIDE HCL 5 MG/ML IJ SOLN
10.0000 mg | Freq: Four times a day (QID) | INTRAMUSCULAR | Status: AC
Start: 2016-10-08 — End: 2016-10-09
  Administered 2016-10-08 – 2016-10-09 (×4): 10 mg via INTRAVENOUS
  Filled 2016-10-08 (×4): qty 2

## 2016-10-08 MED ORDER — SODIUM CHLORIDE 0.45 % IV BOLUS
250.0000 mL | Freq: Once | INTRAVENOUS | Status: AC
  Administered 2016-10-08: 250 mL via INTRAVENOUS

## 2016-10-08 MED ORDER — ENOXAPARIN SODIUM 40 MG/0.4ML ~~LOC~~ SOLN
40.0000 mg | SUBCUTANEOUS | Status: DC
  Administered 2016-10-08 – 2016-10-11 (×4): 40 mg via SUBCUTANEOUS
  Filled 2016-10-08 (×5): qty 0.4

## 2016-10-08 NOTE — Progress Notes (Signed)
Dr. Roxan Hockey paged. Updated of pt condition. Informed that pt has only voided 105 cc since 0700. Awaiting orders at this time.

## 2016-10-08 NOTE — Op Note (Signed)
NAMEJERRY, CLYNE NO.:  [id number]  MEDICAL RECORD NO.:  [id number]  LOCATION:                                 FACILITY:  PHYSICIAN:  Revonda Standard. Roxan Hockey, M.D.DATE OF BIRTH:  1930/06/05  DATE OF PROCEDURE:  10/07/2016 DATE OF DISCHARGE:                              OPERATIVE REPORT   PREOPERATIVE DIAGNOSIS:  Adenocarcinoma, right middle lobe.  POSTOPERATIVE DIAGNOSIS:  Adenocarcinoma, right middle lobe.  PROCEDURES:  Right video-assisted thoracoscopy, thoracoscopic right middle lobectomy, mediastinal lymph node dissection, On-Q local anesthetic catheter placement.  SURGEON:  Revonda Standard. Roxan Hockey, M.D.  ASSISTANT:  Ellwood Handler, PA.  ANESTHESIA:  General.  FINDINGS:  Azygous lobe of the upper lobe. Central tumor in right middle lobe with extension into the area of the main pulmonary artery near the takeoff of the superior segmental and posterior ascending branches.  Bronchial margin and final margin negative for tumor.  CLINICAL NOTE:  Mr. Willden is an 83 year old gentleman who was having a preoperative evaluation for a total knee replacement.  A chest x-ray showed a right middle lobe nodule.  A CT confirmed the mass and it was hypermetabolic on PET-CT.  There also was hypermetabolic activity in both hilar regions without corresponding adenopathy.  Bronchoscopy and endobronchial ultrasound were performed.  The right middle lobe abnormality was an adenocarcinoma.  No tumor was found in the hilar lymph nodes.  The patient was advised to undergo right middle lobectomy for treatment of the tumor.  The alternative of radiation was discussed with the patient.  He understood the indications risks, benefits, and alternatives and wished to proceed.  OPERATIVE NOTE:  Mr. Edelen was brought to the preoperative holding area on October 07, 2016.  Anesthesia placed a central line and an arterial blood pressure monitoring line.  He was taken to the  operating room, anesthetized, and intubated with a double-lumen endotracheal tube. Sequential compression devices were placed on the calves for DVT prophylaxis.  A Foley catheter was placed by Dr. Junious Silk of Urology. Intravenous antibiotics were administered.  He was placed in a left lateral decubitus position and the right chest was prepped and draped in usual sterile fashion.  Single lung ventilation of the left lung was initiated and was tolerated well throughout the procedure.  An incision was made in the seventh intercostal space in the midaxillary line.  A 5-mm port was inserted into the chest.  The thoracoscope was advanced in the chest.  There was a small tear in the visceral pleura on the lower lobe from the trocar.  A 5-cm working incision was made in the fourth interspace anterolaterally.  No rib spreading was performed during the procedure.  The visceral pleural tear in the right lower lobe was sealed with a single firing of the Echelon 45-mm stapler using a gold cartridge, which was used for all the parenchymal staple lines. The inferior ligament was divided.  The inferior ligament level 9 node was removed and sent for permanent pathology.  All lymph nodes that were encountered during the dissection were removed and sent as separate specimens to Pathology.  The pleural reflection was divided at the hilum anteriorly and posteriorly.  Posteriorly, level 7  nodes were removed.  The fissure was inspected.  It was incomplete.  Dissection was begun in the fissure between the middle and lower lobes and a node was identified overlying the basilar pulmonary artery in that area.  This node was dissected off.  With this dissection, the lateral middle lobe branch was identified.  The middle lobe pulmonary veins then were dissected out, encircled, and divided with the endoscopic vascular stapler.  A second port incision was made anterior to the first for placement of the stapler when  dividing the vessels. The camera was moved to the more anterior incision and vessels were divided from the posterior incision.  The lateral pulmonary arterial branch was divided with a vascular stapler as well.  The minor fissure then was completed with sequential firings of the Echelon stapler.  A small portion of the minor fissure at the confluence of the fissures was left intact at this point in time.  The right middle lobe bronchus was isolated and was dissected out, encircled.  The Echelon stapler with a green cartridge was placed across the right middle lobe bronchus and closed.  A test inflation showed good aeration of the upper and lower lobes.  The stapler then was fired transecting the middle lobe bronchus.  There was a large but otherwise benign-appearing node overlying the main pulmonary artery and in close proximity to the medial middle lobe pulmonary artery branch.  This was dissected off enough to allow the medial branch to be divided with a vascular stapler.  As this node was further dissected off the main pulmonary artery, there was a fibrous tissue extending into the area between the main pulmonary artery, the posterior ascending branch of the upper lobe, and the superior segmental branch of the lower lobe.  Once those 2 branches were identified, the parenchymal division was completed with staplers.  The right middle lobe was placed into an endoscopic retrieval bag and sent for frozen section of the bronchial margin as well as the margin along the lymph node and that was en bloc with the specimen. That node was the in continuity with the fibrous tissue noted above. While awaiting the results of the frozen section, an On-Q local anesthetic catheter was tunneled through a separate incision into a subpleural location and primed with 5 mL of 0.5% Marcaine.  Frozen section returned showing the bronchial margin was negative for tumor. The lymph node itself was negative for  tumor, but there was some tumor in the perilymph node tissue.  In the area where the lymph node had been removed, there was additional tissue, which was removed and sent as a final margin which returned with no tumor seen on frozen section.  The chest was copiously irrigated with warm saline.  A test inflation to 30 cm of water showed no leakage from the bronchial stump, although there was some leakage from the parenchymal dissection along the lower lobe.  A 28-French chest tube was placed through the anterior incision.  The posterior incision was closed in a standard fashion.  The upper and lower lobes were reinflated.  The working incision was closed with a #1 Vicryl fascial suture, a 2-0 Vicryl subcutaneous suture, and a 3-0 Vicryl subcuticular suture.  All sponge, needle, and instrument counts were correct at the end of the procedure. The patient was placed back in a supine position.  The chest tubes were placed to suction.  He then was extubated in the operating room and taken to the postanesthetic care unit  in good condition.     Revonda Standard Roxan Hockey, M.D.     SCH/MEDQ  D:  10/07/2016  T:  10/08/2016  Job:  [id number]

## 2016-10-08 NOTE — Discharge Summary (Signed)
Physician Discharge Summary  Patient ID: Raymond Logan MRN: [id number] DOB/AGE: 02-Jan-1931 83 y.o.  Admit date: 10/07/2016 Discharge date: 10/12/2016  Admission Diagnoses:  Patient Active Problem List   Diagnosis Date Noted  . Mass of middle lobe of right lung 08/26/2016  . Abnormal urinalysis 08/18/2016  . Hyperglycemia 03/11/2016  . Spinal stenosis, lumbar region, with neurogenic claudication 08/07/2014  . Mild cognitive impairment 04/03/2014  . Back pain 12/01/2010  . Preventative health care 11/30/2010  . GERD 04/15/2010  . HOARSENESS 04/15/2010  . COPD GOLD 0 04/03/2010  . Insomnia 04/24/2009  . FATIGUE 01/08/2008  . UNSPEC HEMORRHOIDS WITHOUT MENTION COMPLICATION 82/95/6213  . ALLERGIC RHINITIS 01/23/2007  . PEPTIC ULCER DISEASE 01/23/2007  . Hyperlipidemia 09/10/2006  . BENIGN PROSTATIC HYPERTROPHY 09/10/2006   Discharge Diagnoses:   Patient Active Problem List   Diagnosis Date Noted  . S/P lobectomy of lung 10/07/2016  . Mass of middle lobe of right lung 08/26/2016  . Abnormal urinalysis 08/18/2016  . Hyperglycemia 03/11/2016  . Spinal stenosis, lumbar region, with neurogenic claudication 08/07/2014  . Mild cognitive impairment 04/03/2014  . Back pain 12/01/2010  . Preventative health care 11/30/2010  . GERD 04/15/2010  . HOARSENESS 04/15/2010  . COPD GOLD 0 04/03/2010  . Insomnia 04/24/2009  . FATIGUE 01/08/2008  . UNSPEC HEMORRHOIDS WITHOUT MENTION COMPLICATION 08/65/7846  . ALLERGIC RHINITIS 01/23/2007  . PEPTIC ULCER DISEASE 01/23/2007  . Hyperlipidemia 09/10/2006  . BENIGN PROSTATIC HYPERTROPHY 09/10/2006  -Postoperative atrial fibrillation  Discharged Condition: good  History of Present Illness:  Raymond Logan is an 83 yo white male with known medical history of smoking abuse (quit 40 yrs ago), BPH, Hyperlipidemia, GERD, and Arthritis.  He remains physically active playing full court basketball.  He developed problems with his knee which progressed  to being severe, prompting him to undergo evaluation to have his knee replaced.  During the preoperative workup CXR showed a right lung nodule.  CT scan of the chest was obtained and showed a 3 x 2.3 x 2.9 cm spiculated nodule in the right middle lobe.  There was no hilar or mediastinal adenopathy.  PET CT scan was obtained and showed the mass to be mildly hypermetabolic.  There was also evidence of hypermetabolic lymph node activity in both hilar regions.  He was referred to [facility] for further evaluation.  He was evaluated by Dr. Roxan Hockey at which time the patient denied symptoms of shortness of breath, chest pain, and cough.  It was felt with the evidence of hypermetabolic activity that the patient should undergo a navigational biopsy to obtain diagnosis.  The risks and benefits of the procedure were explained to the patient and he was agreeable to proceed.  This was performed and 09/23/2016 and pathology was suspicious for Adenocarcinoma.  He presented for follow up with Dr. Roxan Hockey on 09/29/2016 at which time it was felt they should proceed with Right Middle Lobectomy.  The risks and benefits of the procedure were explained to the patient and he was agreeable to proceed.      Hospital Course:   Mr. Moskal presented to [facility] on 10/07/2016.  He was taken to the operating room and underwent  Cystoscopy with Foley placement,  Right VATS with Right Middle Lobectomy, and Lymph Node Dissection.  He tolerated the procedure without difficulty, was extubated, and taken to the SICU in stable condition.  The patient progressed nicely post operatively.  His chest tube had minimal output and was free from air leak.  He was transitioned to water seal on POD #1.  His chest tube was removed on 10/09/2016. Follow up CXR showed right apical pneumothorax.  He had some mild bowel distention with hypoactive BS.  He was treated with Reglan and his diet was advanced slowly.  He was ambulating and stable for transfer  to the telemetry unit on 10/09/2016.  He developed Atrial Fibrillation and was treated with IV Amiodarone.  He converted to NSR and was transitioned to oral regimen.  He is ambulating on room air. He had a fair amount of incisional pain so PCA and ON Q remained after chest tube was removed. He was then transitioned to oral pain medication.  He had 24 hours of diarrhea and vomiting.  This resolved without intervention.  His incisions are clean and dry. He is felt surgically stable for discharge today.   Significant Diagnostic Studies: nuclear medicine:   1. Elongated right middle lobe lung lesion is weakly hypermetabolic but still suspicious for neoplasm. It is sub solid appearance may suggest adenocarcinoma with FDG positive bilateral hilar lymph nodes. This lesion should be amenable to bronchoscopic biopsy as it is surrounding a right middle lobe bronchus. 2. No findings for metastatic disease involving the abdomen/pelvis, neck or supraclavicular regions. No findings for osseous metastatic Disease.  Pathology:  1. Lung, resection (segmental or lobe), RML - INVASIVE ADENOCARCINOMA, WELL DIFFERENTIATED, SPANNING 2.5 CM. - TUMOR INVOLVES VASCULAR MARGINS. - BRONCHIAL AND FINAL PARENCHYMAL MARGIN (PART #2) ARE NEGATIVE. - TUMOR IS LIMITED TO LUNG. - TWO OF TWO LYMPH NODES NEGATIVE FOR CARCINOMA (0/2). - SEE ONCOLOGY TABLE. 2. Lung, biopsy, Upper margin right - BENIGN LUNG TISSUE. - NO MALIGNANCY IDENTIFIED. 3. Lymph node, biopsy, 7 #2 - ONE OF ONE LYMPH NODES NEGATIVE FOR TUMOR (0/1). 4. Lymph node, biopsy, 12 R - ONE OF ONE LYMPH NODES NEGATIVE FOR TUMOR (0/1). 5. Lymph node, biopsy, 12R #2 - ONE OF ONE LYMPH NODES NEGATIVE FOR TUMOR (0/1). 6. Lymph node, biopsy, 12R #3 - ONE OF ONE LYMPH NODES NEGATIVE FOR TUMOR (0/1). 7. Lymph node, biopsy, 12R #4 - ONE OF ONE LYMPH NODES NEGATIVE FOR TUMOR (0/1). 8. Lymph node, biopsy, 9R - ONE OF ONE LYMPH NODES NEGATIVE FOR TUMOR (0/1). 9. Lymph  node, biopsy, 4R - ONE OF ONE LYMPH NODES NEGATIVE FOR TUMOR (0/1). 10. Lymph node, biopsy, 10R - ONE OF ONE LYMPH NODES NEGATIVE FOR TUMOR (0/1). 11. Lymph node, biopsy, 7 #1 - ONE OF ONE LYMPH NODES NEGATIVE FOR TUMOR (0/1).  Treatments: surgery:   Right video-assisted thoracoscopy, thoracoscopic right middle lobectomy, mediastinal lymph node dissection, On-Q local anesthetic catheter placement.  Exam under anesthesia, cystoscopy, Foley catheter placement  Disposition: 01-Home or Self Care   Discharge Medications:   Allergies as of 10/12/2016      Reactions   Alfuzosin Other (See Comments)   BP went crazy, dizzy, passing out    Aricept [donepezil Hcl] Other (See Comments)   Insomnia, headache   Penicillins Hives   PATIENT HAS HAD A PCN REACTION WITH IMMEDIATE RASH, FACIAL/TONGUE/THROAT SWELLING, SOB, OR LIGHTHEADEDNESS WITH HYPOTENSION:  #  #  #  YES  #  #  #   Has patient had a PCN reaction causing severe rash involving mucus membranes or skin necrosis:No Has patient had a PCN reaction that required hospitalization:No Has patient had a PCN reaction occurring within the last 10 years:No If all of the above answers are "NO", then may proceed with Cephalosporin use.  Medication List    TAKE these medications   acetaminophen 500 MG tablet Commonly known as:  TYLENOL Take 2 tablets (1,000 mg total) by mouth every 6 (six) hours as needed.   amiodarone 400 MG tablet Commonly known as:  PACERONE Take 1 tablet (400 mg total) by mouth 2 (two) times daily. For 7 Days, then decrease to 200 mg (1/2 tab) BID for 7 days, then decrease to 200 mg daily   aspirin EC 325 MG tablet Take 325-650 mg by mouth every 6 (six) hours as needed (for pain.).   aspirin-acetaminophen-caffeine 644-034-74 MG tablet Commonly known as:  EXCEDRIN MIGRAINE Take 2 tablets by mouth daily as needed for headache or migraine.   calcium carbonate 500 MG chewable tablet Commonly known as:  TUMS - dosed  in mg elemental calcium Chew 1 tablet by mouth 3 (three) times daily as needed (for acid reflux.).   diclofenac sodium 1 % Gel Commonly known as:  VOLTAREN Apply 1-2 g topically 4 (four) times daily as needed (for knee pain.).   eszopiclone 2 MG Tabs tablet Commonly known as:  LUNESTA Take 1 tablet (2 mg total) by mouth at bedtime as needed for sleep. Take immediately before bedtime   finasteride 5 MG tablet Commonly known as:  PROSCAR Take 5 mg by mouth at bedtime.   gabapentin 600 MG tablet Commonly known as:  NEURONTIN Take 600 mg by mouth 2 (two) times daily as needed (for pain (typically once daily in the morning)).   guaiFENesin-dextromethorphan 100-10 MG/5ML syrup Commonly known as:  ROBITUSSIN DM Take 5 mLs by mouth every 4 (four) hours as needed for cough.   ROLAIDS ADVANCED 1000-200-40 MG Chew Generic drug:  Cal Carb-Mag Hydrox-Simeth Chew 1 tablet by mouth 3 (three) times daily as needed (for acid reflux).   silodosin 8 MG Caps capsule Commonly known as:  RAPAFLO Take 8 mg by mouth at bedtime.   traMADol 50 MG tablet Commonly known as:  ULTRAM Take 1 tablet (50 mg total) by mouth every 6 (six) hours as needed for moderate pain.      Follow-up Information    [name], MD Follow up on 10/26/2016.   Specialty:  Cardiothoracic Surgery Why:  Appointment is at 9:45, please get CXR at 9:15 at [facility] imaging located on first floor of our office building Contact information: [place] [address] [phone]        Biagio Borg, MD. Schedule an appointment as soon as possible for a visit in 2 week(s).   Specialties:  Internal Medicine, Radiology Why:  hospital follow up, follow up EKG for A. Fib Contact information: [place] [address] [phone]           Signed: Ellwood Handler 10/12/2016, 8:14 AM

## 2016-10-08 NOTE — Progress Notes (Signed)
1 Day Post-Op Procedure(s) (LRB): VIDEO ASSISTED THORACOSCOPY (VATS)/RIGHT MIDDLE LOBECTOMY (Right) CYSTOSCOPY (N/A) FOLEY  CATHETER PLACEMENT (N/A) Subjective: Some incisional pain, denies nausea  Objective: Vital signs in last 24 hours: Temp:  [97.2 F (36.2 C)-98.1 F (36.7 C)] 98.1 F (36.7 C) (08/10 0300) Pulse Rate:  [50-64] 59 (08/10 0705) Cardiac Rhythm: Normal sinus rhythm (08/09 2000) Resp:  [6-17] 9 (08/10 0705) BP: (87-142)/(44-72) 106/54 (08/10 0705) SpO2:  [91 %-100 %] 91 % (08/10 0705) Arterial Line BP: (104-169)/(37-69) 124/44 (08/10 0600) Weight:  [158 lb 11.7 oz (72 kg)] 158 lb 11.7 oz (72 kg) (08/09 1600)  Hemodynamic parameters for last 24 hours:    Intake/Output from previous day: 08/09 0701 - 08/10 0700 In: 3876.4 [P.O.:420; I.V.:3256.4; IV Piggyback:200] Out: 1988 [Urine:1538; Blood:200; Chest Tube:250] Intake/Output this shift: No intake/output data recorded.  General appearance: alert, cooperative and no distress Neurologic: intact Heart: regular rate and rhythm Lungs: diminished breath sounds bibasilar Abdomen: mildly distended, tympanitic, nontender, hypoactive BS no air leak  Lab Results:  Recent Labs  10/05/16 0934 10/08/16 0428  WBC 5.2 7.9  HGB 13.1 11.2*  HCT 36.9* 33.1*  PLT 171 197   BMET:  Recent Labs  10/05/16 0934 10/08/16 0428  NA 139 140  K 4.2 4.6  CL 109 110  CO2 21* 23  GLUCOSE 101* 168*  BUN 16 21*  CREATININE 0.96 1.12  CALCIUM 8.9 7.8*    PT/INR:  Recent Labs  10/05/16 0934  LABPROT 13.9  INR 1.07   ABG    Component Value Date/Time   PHART 7.336 (L) 10/08/2016 0330   HCO3 21.8 10/08/2016 0330   ACIDBASEDEF 3.1 (H) 10/08/2016 0330   O2SAT 94.6 10/08/2016 0330   CBG (last 3)  No results for input(s): GLUCAP in the last 72 hours.  Assessment/Plan: S/P Procedure(s) (LRB): VIDEO ASSISTED THORACOSCOPY (VATS)/RIGHT MIDDLE LOBECTOMY (Right) CYSTOSCOPY (N/A) FOLEY  CATHETER PLACEMENT (N/A) Plan  for transfer to step-down: see transfer orders  POD # 1 Doing well  CV- stable, sinus bradycardia, BP OK  RESP_ s/p RML. No air leak- CT to water seal. Not much drainage  IS  RENAL- creatinine and lytes OK  ENDO_ CBG elevated with D5NS- decrease rate to 50 ml/hjr  GI_ no nausea but stomach dilated on CXR and hypoactive BS  Clears today. Will give reglan x 24 hours  Advance diet when BS improve  SCD + enoxaparin for DVT prophylaxis  Continue ambulation   LOS: 1 day    [name] 10/08/2016

## 2016-10-08 NOTE — Op Note (Signed)
Raymond Logan, Raymond Logan NO.:  [id number]  MEDICAL RECORD NO.:  [id number]  LOCATION:                                 FACILITY:  PHYSICIAN:  Revonda Standard. Roxan Hockey, M.D.DATE OF BIRTH:  1931/02/18  DATE OF PROCEDURE:  10/07/2016 DATE OF DISCHARGE:                              OPERATIVE REPORT   POSTOPERATIVE DIAGNOSIS:  Adenocarcinoma, right middle lobe.  POSTOPERATIVE DIAGNOSIS:  Adenocarcinoma, right middle lobe.  PROCEDURES:  Right video-assisted thoracoscopy, thoracoscopic right middle lobectomy, mediastinal lymph node dissection, On-Q local anesthetic catheter placement.  SURGEON:  Revonda Standard. Roxan Hockey, M.D.  ASSISTANT:  Ellwood Handler, PA.  ANESTHESIA:  General.  FINDINGS:  Central tumor in right middle lobe with extension into the area of the main pulmonary artery near the takeoff of the superior segmental and posterior ascending branches.  Bronchial margin and final margin negative for tumor.  CLINICAL NOTE:  This is an 83 year old gentleman who was being evaluated for a right total knee replaced.  His preoperative evaluation and chest x-ray showed a lung nodule.  This was confirmed by CT and on PET the nodule was hypermetabolic.  He also had metabolic activity in both hilar regions.  He underwent bronchoscopy and endobronchial ultrasound.  The right middle lobe lesion was an adenocarcinoma.  Aspirations from hilar lymph nodes revealed no evidence of tumor.  The patient was advised to undergo right middle lobectomy.  The indications risks, benefits, and alternatives were discussed in detail.  He understood and accepted the risks and agreed to proceed.  OPERATIVE NOTE:  Raymond Logan was brought to the preoperative holding area on October 07, 2016.  Anesthesia placed a central line and an arterial blood pressure monitoring line.  He was taken to the operating room, anesthetized, and intubated.  A Foley catheter was placed. Sequential compression  devices were placed for DVT prophylaxis. Intravenous antibiotics were administered.  He was placed in a left lateral decubitus position and the right chest was prepped and draped in usual sterile fashion.  Single lung ventilation of the left lung was initiated and was tolerated well throughout the procedure.  An incision was made in the seventh intercostal space in the midaxillary line.  A 5-mm port was inserted.  The thoracoscope was advanced into the chest.  There was good isolation of the right lung.  There was a small tear in the visceral pleura of the lower lobe from the trocar placement. A 5-cm working incision was made in the fourth interspace anterolaterally.  No rib spreading was performed during the procedure. The area of the visceral tear in the right lower lobe was closed with an endoscopic stapler.  An Echelon 45-mm powered stapler was used.  A gold cartridge was used on the parenchyma.  The dissection was begun in the major fissure.  This was relatively incomplete and there was a large node in the fissure.  This was dissected out exposing the main pulmonary artery branch to the basilar segments of the lower lobe.  The inferior ligament was divided with the Harmonic Scalpel.  All lymph nodes that were encountered during the dissection were removed and sent for permanent pathology.  The pleural reflection was divided  at the hilum anteriorly and posteriorly.  Subcarinal nodes were removed.  The right middle lobe pulmonary veins were identified.  These were dissected out, encircled, and divided with an endoscopic vascular stapler. A second port incision was made for placement of the stapler. A portion of the minor fissure then was completed with sequential firings of the Echelon stapler, again using a gold cartridge.  The more lateral middle lobe pulmonary arterial branch was encircled and divided with an endoscopic vascular stapler.  At this point, there was a large node  that was adjacent to the medial branch that was dissected out.  This was relatively a slow and tedious dissection. Once the node was dissected off  the medial pulmonary artery branch, it was easily encircled and divided.  The remainder of the node then was dissected off the main pulmonary artery.  This was the main pulmonary artery in the areas between the superior segmental branch to the lower lobe and the posterior ascending branch to the upper lobe.  The dissection was carried down onto the artery itself. The right middle lobe bronchus was divided with the Echelon stapler using a green cartridge.  A test inflation was done to show aeration in the lower and upper lobes prior to transecting the bronchus.  The parenchymal division was completed using a gold cartridge in the Echelon stapler.  The lymph node in question which had been resected en bloc was marked for frozen section and frozen section was performed to the bronchial margin.  While awaiting results of frozen section, an On-Q local anesthetic catheter was tunneled through a separate incision posteriorly into a subpleural location and primed with 5 mL of 0.5% Marcaine.  The frozen section returned showing no tumor at the bronchial margin.  There was a tumor in tissue adjacent to the lymph node, but not in the node itself.  Additional tissue in the area was taken off the pulmonary artery.  There was no evidence of invasion of the pulmonary artery.  This was sent as the final margin.  Frozen section revealed no tumor in that specimen.  The upper lobe was retracted downward.  There was an azygos lobe of the upper lobe.  The paratracheal pleura was incised and multiple level 4R nodes were removed.  These all appeared normal in size.  The chest was copiously irrigated with warm saline.  A test inflation showed good aeration of the upper and lower lobes.  There was some air leak from the parenchymal dissection, but no air leak from the  bronchial stump.  A 28-French chest tube was placed through the original port incision. The second port incision that had been made for placement of the stapler while dividing the vessels was closed with 0 Vicryl fascial suture and 3-0 Vicryl subcuticular suture.  The upper and lower lobes were reinflated and dual-lung ventilation was resumed.   DICTATION ENDS HERE   The chest tube was placed to suction. The patient was placed back in a supine position. He was extubated in the operating room and taken to the postanesthetic care unit in good condition. Counts were correct at the end of the procedure.   Revonda Standard Roxan Hockey, M.D.   ______________________________ Revonda Standard. Roxan Hockey, M.D.    SCH/MEDQ  D:  10/07/2016  T:  10/07/2016  Job:  [id number]  REFER TO OP NOTE DATED 10/08/2016. [facility] 10/18/2016

## 2016-10-08 NOTE — Care Management Note (Signed)
Case Management Note  Patient Details  Name: Raymond Logan MRN: [id number] Date of Birth: September 19, 1930  Subjective/Objective:  From home, POD 1 VATS, Lobectomy, chest tube to water seal,  On clears.                Action/Plan: NCM will follow for dc needs.   Expected Discharge Date:                  Expected Discharge Plan:     In-House Referral:     Discharge planning Services  CM Consult  Post Acute Care Choice:    Choice offered to:     DME Arranged:    DME Agency:     HH Arranged:    HH Agency:     Status of Service:  In process, will continue to follow  If discussed at Long Length of Stay Meetings, dates discussed:    Additional Comments:  Zenon Mayo, RN 10/08/2016, 12:27 PM

## 2016-10-09 ENCOUNTER — Inpatient Hospital Stay (HOSPITAL_COMMUNITY): Payer: Medicare Other

## 2016-10-09 LAB — CBC
HEMATOCRIT: 31.4 % — AB (ref 39.0–52.0)
HEMOGLOBIN: 10.5 g/dL — AB (ref 13.0–17.0)
MCH: 30.2 pg (ref 26.0–34.0)
MCHC: 33.4 g/dL (ref 30.0–36.0)
MCV: 90.2 fL (ref 78.0–100.0)
Platelets: 154 10*3/uL (ref 150–400)
RBC: 3.48 MIL/uL — AB (ref 4.22–5.81)
RDW: 13.1 % (ref 11.5–15.5)
WBC: 6.6 10*3/uL (ref 4.0–10.5)

## 2016-10-09 LAB — COMPREHENSIVE METABOLIC PANEL
ALBUMIN: 2.9 g/dL — AB (ref 3.5–5.0)
ALT: 11 U/L — AB (ref 17–63)
AST: 19 U/L (ref 15–41)
Alkaline Phosphatase: 47 U/L (ref 38–126)
Anion gap: 6 (ref 5–15)
BUN: 17 mg/dL (ref 6–20)
CHLORIDE: 107 mmol/L (ref 101–111)
CO2: 25 mmol/L (ref 22–32)
CREATININE: 1.08 mg/dL (ref 0.61–1.24)
Calcium: 8 mg/dL — ABNORMAL LOW (ref 8.9–10.3)
Glucose, Bld: 92 mg/dL (ref 65–99)
POTASSIUM: 3.7 mmol/L (ref 3.5–5.1)
Sodium: 138 mmol/L (ref 135–145)
Total Bilirubin: 0.7 mg/dL (ref 0.3–1.2)
Total Protein: 5.2 g/dL — ABNORMAL LOW (ref 6.5–8.1)

## 2016-10-09 MED ORDER — GUAIFENESIN-DM 100-10 MG/5ML PO SYRP
5.0000 mL | ORAL_SOLUTION | ORAL | Status: DC | PRN
  Administered 2016-10-09 – 2016-10-10 (×2): 5 mL via ORAL
  Filled 2016-10-09 (×2): qty 5

## 2016-10-09 NOTE — Progress Notes (Signed)
2 Days Post-Op Procedure(s) (LRB): VIDEO ASSISTED THORACOSCOPY (VATS)/RIGHT MIDDLE LOBECTOMY (Right) CYSTOSCOPY (N/A) FOLEY  CATHETER PLACEMENT (N/A) Subjective: More chest wall pain today  Objective: Vital signs in last 24 hours: Temp:  [97.7 F (36.5 C)-99.9 F (37.7 C)] 99.9 F (37.7 C) (08/11 0814) Pulse Rate:  [52-76] 66 (08/11 0814) Cardiac Rhythm: Normal sinus rhythm (08/11 0717) Resp:  [7-23] 17 (08/11 0814) BP: (94-124)/(50-96) 122/53 (08/11 0814) SpO2:  [91 %-97 %] 91 % (08/11 0814)  Hemodynamic parameters for last 24 hours:    Intake/Output from previous day: 08/10 0701 - 08/11 0700 In: 2444.2 [P.O.:790; I.V.:1404.2; IV Piggyback:250] Out: 940 [Urine:870; Chest Tube:70] Intake/Output this shift: Total I/O In: 203.8 [I.V.:203.8] Out: 110 [Urine:110]  General appearance: alert and cooperative Neurologic: intact Heart: regular rate and rhythm, S1, S2 normal, no murmur, click, rub or gallop Lungs: clear to auscultation bilaterally Extremities: extremities normal, atraumatic, no cyanosis or edema Wound: dressings dry minimal drainage and no air leak from chest tube  Lab Results:  Recent Labs  10/08/16 0428 10/09/16 0405  WBC 7.9 6.6  HGB 11.2* 10.5*  HCT 33.1* 31.4*  PLT 197 154   BMET:  Recent Labs  10/08/16 0428 10/09/16 0405  NA 140 138  K 4.6 3.7  CL 110 107  CO2 23 25  GLUCOSE 168* 92  BUN 21* 17  CREATININE 1.12 1.08  CALCIUM 7.8* 8.0*    PT/INR: No results for input(s): LABPROT, INR in the last 72 hours. ABG    Component Value Date/Time   PHART 7.336 (L) 10/08/2016 0330   HCO3 21.8 10/08/2016 0330   ACIDBASEDEF 3.1 (H) 10/08/2016 0330   O2SAT 94.6 10/08/2016 0330   CBG (last 3)  No results for input(s): GLUCAP in the last 72 hours.  CXR: no ptx. Minimal atelectasis. No effusion  Assessment/Plan: S/P Procedure(s) (LRB): VIDEO ASSISTED THORACOSCOPY (VATS)/RIGHT MIDDLE LOBECTOMY (Right) CYSTOSCOPY (N/A) FOLEY  CATHETER  PLACEMENT (N/A)  He has been hemodynamically stable.  CXR looks good and no air leak, no drainage so will remove chest tube. Continue On-Q and PCA today. He says Oxy works for him so will transition him to that over next 24 hrs.  Keep foley in today. He has enlarged prostate and had to have foley inserted by urology. Daughter will bring Rapaflow in from home to start today. IS, ambulation. Just got bed on 4E  Discussed with daughter at bedside.   LOS: 2 days    Gaye Pollack 10/09/2016

## 2016-10-10 ENCOUNTER — Inpatient Hospital Stay (HOSPITAL_COMMUNITY): Payer: Medicare Other

## 2016-10-10 MED ORDER — TRAMADOL HCL 50 MG PO TABS: 50.0000 mg | ORAL_TABLET | Freq: Four times a day (QID) | ORAL | Status: DC | PRN

## 2016-10-10 MED ORDER — ENSURE ENLIVE PO LIQD
237.0000 mL | Freq: Three times a day (TID) | ORAL | Status: DC
  Administered 2016-10-10: 237 mL via ORAL

## 2016-10-10 MED ORDER — AMIODARONE HCL IN DEXTROSE 360-4.14 MG/200ML-% IV SOLN
30.0000 mg/h | INTRAVENOUS | Status: DC
  Administered 2016-10-10 – 2016-10-11 (×3): 30 mg/h via INTRAVENOUS
  Filled 2016-10-10 (×2): qty 200

## 2016-10-10 MED ORDER — ALUM & MAG HYDROXIDE-SIMETH 200-200-20 MG/5ML PO SUSP
30.0000 mL | ORAL | Status: DC | PRN
  Administered 2016-10-10: 30 mL via ORAL
  Filled 2016-10-10 (×2): qty 30

## 2016-10-10 MED ORDER — AMIODARONE LOAD VIA INFUSION
150.0000 mg | Freq: Once | INTRAVENOUS | Status: AC
  Administered 2016-10-10: 150 mg via INTRAVENOUS
  Filled 2016-10-10: qty 83.34

## 2016-10-10 MED ORDER — SILODOSIN 8 MG PO CAPS
8.0000 mg | ORAL_CAPSULE | Freq: Every day | ORAL | Status: DC
  Administered 2016-10-10 – 2016-10-11 (×2): 8 mg via ORAL

## 2016-10-10 MED ORDER — ALBUMIN HUMAN 5 % IV SOLN
12.5000 g | Freq: Once | INTRAVENOUS | Status: AC
  Administered 2016-10-10: 12.5 g via INTRAVENOUS
  Filled 2016-10-10: qty 250

## 2016-10-10 MED ORDER — AMIODARONE HCL IN DEXTROSE 360-4.14 MG/200ML-% IV SOLN
60.0000 mg/h | INTRAVENOUS | Status: AC
  Administered 2016-10-10 (×2): 60 mg/h via INTRAVENOUS
  Filled 2016-10-10 (×2): qty 200

## 2016-10-10 MED ORDER — OXYCODONE HCL 5 MG PO TABS
5.0000 mg | ORAL_TABLET | ORAL | Status: DC | PRN
  Administered 2016-10-11: 5 mg via ORAL
  Filled 2016-10-10: qty 1

## 2016-10-10 NOTE — Progress Notes (Signed)
dc'ed on Q pt. tolerated well

## 2016-10-10 NOTE — Progress Notes (Signed)
Witnessed waste of 19 cc of dilaudid in the sink by [employer].  Fritz Pickerel, RN

## 2016-10-10 NOTE — Progress Notes (Signed)
  Amiodarone Drug - Drug Interaction Consult Note  Recommendations: NO SPECIFIC CURRENT INTERACTIONS.  Amiodarone is metabolized by the cytochrome P450 system and therefore has the potential to cause many drug interactions. Amiodarone has an average plasma half-life of 50 days (range 20 to 100 days).   There is potential for drug interactions to occur several weeks or months after stopping treatment and the onset of drug interactions may be slow after initiating amiodarone.   []  Statins: Increased risk of myopathy. Simvastatin- restrict dose to 20mg  daily. Other statins: counsel patients to report any muscle pain or weakness immediately.  []  Anticoagulants: Amiodarone can increase anticoagulant effect. Consider warfarin dose reduction. Patients should be monitored closely and the dose of anticoagulant altered accordingly, remembering that amiodarone levels take several weeks to stabilize.  []  Antiepileptics: Amiodarone can increase plasma concentration of phenytoin, the dose should be reduced. Note that small changes in phenytoin dose can result in large changes in levels. Monitor patient and counsel on signs of toxicity.  []  Beta blockers: increased risk of bradycardia, AV block and myocardial depression. Sotalol - avoid concomitant use.  []   Calcium channel blockers (diltiazem and verapamil): increased risk of bradycardia, AV block and myocardial depression.  []   Cyclosporine: Amiodarone increases levels of cyclosporine. Reduced dose of cyclosporine is recommended.  []  Digoxin dose should be halved when amiodarone is started.  []  Diuretics: increased risk of cardiotoxicity if hypokalemia occurs.  []  Oral hypoglycemic agents (glyburide, glipizide, glimepiride): increased risk of hypoglycemia. Patient's glucose levels should be monitored closely when initiating amiodarone therapy.   []  Drugs that prolong the QT interval:  Torsades de pointes risk may be increased with concurrent use -  avoid if possible.  Monitor QTc, also keep magnesium/potassium WNL if concurrent therapy can't be avoided. [name] Antibiotics: e.g. fluoroquinolones, erythromycin. . Antiarrhythmics: e.g. quinidine, procainamide, disopyramide, sotalol. . Antipsychotics: e.g. phenothiazines, haloperidol.  . Lithium, tricyclic antidepressants, and methadone.  Thank you,  Wynona Neat, PharmD, BCPS 10/10/2016 1:15 AM

## 2016-10-10 NOTE — Progress Notes (Addendum)
      [address]       [place] [address]             [phone]       3 Days Post-Op Procedure(s) (LRB): VIDEO ASSISTED THORACOSCOPY (VATS)/RIGHT MIDDLE LOBECTOMY (Right) CYSTOSCOPY (N/A) FOLEY  CATHETER PLACEMENT (N/A)  Subjective: Patient had a weird dream last night. He states nurses "were at him" a fair amount (in a fib).  Objective: Vital signs in last 24 hours: Temp:  [98 F (36.7 C)-100.9 F (38.3 C)] 98.3 F (36.8 C) (08/12 0800) Pulse Rate:  [63-146] 82 (08/12 0800) Cardiac Rhythm: Atrial fibrillation (08/12 0730) Resp:  [14-25] 17 (08/12 0800) BP: (101-132)/(56-104) 103/65 (08/12 0800) SpO2:  [92 %-96 %] 92 % (08/12 0800)     Intake/Output from previous day: 08/11 0701 - 08/12 0700 In: 643.8 [P.O.:440; I.V.:203.8] Out: 1610 [Urine:1610]   Physical Exam:  Cardiovascular: IRRR IRRR Pulmonary: Somewhat coarse breath sounds bilaterally Abdomen: Soft, non tender, bowel sounds present. Extremities: Mild bilateral lower extremity edema. Wounds: Clean and dry.  No erythema or signs of infection.   Lab Results: CBC: Recent Labs  10/08/16 0428 10/09/16 0405  WBC 7.9 6.6  HGB 11.2* 10.5*  HCT 33.1* 31.4*  PLT 197 154   BMET:  Recent Labs  10/08/16 0428 10/09/16 0405  NA 140 138  K 4.6 3.7  CL 110 107  CO2 23 25  GLUCOSE 168* 92  BUN 21* 17  CREATININE 1.12 1.08  CALCIUM 7.8* 8.0*    PT/INR: No results for input(s): LABPROT, INR in the last 72 hours. ABG:  INR: Will add last result for INR, ABG once components are confirmed Will add last 4 CBG results once components are confirmed  Assessment/Plan:  1. CV - Went into a fib yesterday. On Amiodarone drip. He remains in a fib with a controlled ventricular rate. Will continue Amiodarone drip for now.  2.  Pulmonary - On room air. CXR this am shows tiny right apical pneumothorax. Re check CXR in amEncourage incentive spirometer. 3. GU-BPH. Foley placed by Dr. Junious Silk  prior to surgery. Continue Proscar 5 mg at hs and daughter brought in Rapaflow as Flomax does not work for him. 4. GI-advance diet and try Vanilla Ensure (at patient request) 5. Regarding pain control, stop PCA and use oral Oxy or Ultram  ZIMMERMAN,DONIELLE MPA-C 10/10/2016,10:41 AM   Chart reviewed, patient examined, agree with above. He went back into sinus this afternoon. Will continue IV amio tonight and switch to oral tomorrow if maintaining sinus. His daughter reports coughing after drinking liquids and questions if he has some swallowing difficulty. Will ask speech therapy to evaluate him. Told him not to use straw and to sit up when drinking. He is just about to take his first dose of Rapaflow postop so would keep his foley in for now. He is still using PCA but in am can DC it and use oral pain meds.

## 2016-10-11 ENCOUNTER — Inpatient Hospital Stay (HOSPITAL_COMMUNITY): Payer: Medicare Other

## 2016-10-11 LAB — BPAM RBC
BLOOD PRODUCT EXPIRATION DATE: 201808292359
BLOOD PRODUCT EXPIRATION DATE: 201808292359
ISSUE DATE / TIME: 201808090737
ISSUE DATE / TIME: 201808090737
UNIT TYPE AND RH: 6200
Unit Type and Rh: 6200

## 2016-10-11 LAB — TYPE AND SCREEN
ABO/RH(D): A POS
Antibody Screen: NEGATIVE
UNIT DIVISION: 0
Unit division: 0

## 2016-10-11 MED ORDER — CALCIUM CARBONATE ANTACID 500 MG PO CHEW
1.0000 | CHEWABLE_TABLET | Freq: Three times a day (TID) | ORAL | Status: DC | PRN
  Administered 2016-10-11: 400 mg via ORAL
  Filled 2016-10-11: qty 2

## 2016-10-11 MED ORDER — AMIODARONE HCL 200 MG PO TABS
400.0000 mg | ORAL_TABLET | Freq: Two times a day (BID) | ORAL | Status: DC
  Administered 2016-10-11 – 2016-10-12 (×3): 400 mg via ORAL
  Filled 2016-10-11 (×3): qty 2

## 2016-10-11 NOTE — Evaluation (Signed)
Clinical/Bedside Swallow Evaluation Patient Details  Name: Raymond Logan MRN: [id number] Date of Birth: [birth year]  Today's Date: 10/11/2016 Time: SLP Start Time (ACUTE ONLY): 1010 SLP Stop Time (ACUTE ONLY): 1025 SLP Time Calculation (min) (ACUTE ONLY): 15 min  Past Medical History:  Past Medical History:  Diagnosis Date  . ALLERGIC RHINITIS 01/23/2007  . Arthritis    hands  . BENIGN PROSTATIC HYPERTROPHY 09/10/2006  . Chronic headaches   . Complication of anesthesia    difficulty voiding- if cath needed needs to placed with need to be done with scope  . COPD, MILD 04/03/2010   patient denies on preop visit of 08/02/2014   . Difficulty in urination   . FATIGUE 01/08/2008  . GERD 04/15/2010  . H. pylori infection    hx of   . Headache(784.0) 09/10/2006  . HOARSENESS 04/15/2010  . HYPERLIPIDEMIA 09/10/2006  . INSOMNIA-SLEEP DISORDER-UNSPEC 04/24/2009  . PEPTIC ULCER DISEASE 01/23/2007  . Pneumonia    1955   Past Surgical History:  Past Surgical History:  Procedure Laterality Date  . CATARACT EXTRACTION Left   . COLONOSCOPY    . CYSTOSCOPY N/A 10/07/2016   Procedure: CYSTOSCOPY;  Surgeon: Festus Aloe, MD;  Location: [facility];  Service: Urology;  Laterality: N/A;  . HEMORRHOID BANDING    . INSERTION OF SUPRAPUBIC CATHETER N/A 10/07/2016   Procedure: FOLEY  CATHETER PLACEMENT;  Surgeon: Festus Aloe, MD;  Location: [facility];  Service: Urology;  Laterality: N/A;  . KNEE ARTHROSCOPY Left    torn meniscus  . LUMBAR LAMINECTOMY/DECOMPRESSION MICRODISCECTOMY Right 08/07/2014   Procedure: complete DECOMPRESSION LUMBAR LAMINECTOMY/MICRODISCECTOMY OF L4-L5 for spinal stenosis, DECOMPRESSION OF L3-L4;  Surgeon: Latanya Maudlin, MD;  Location: [facility];  Service: Orthopedics;  Laterality: Right;  . ROTATOR CUFF REPAIR Right   . SHOULDER ARTHROSCOPY     x3, L & R  . VIDEO ASSISTED THORACOSCOPY (VATS)/ LOBECTOMY Right 10/07/2016   Procedure: VIDEO ASSISTED THORACOSCOPY (VATS)/RIGHT MIDDLE  LOBECTOMY;  Surgeon: [name], MD;  Location: [facility];  Service: Thoracic;  Laterality: Right;  [name] VIDEO BRONCHOSCOPY WITH ENDOBRONCHIAL NAVIGATION N/A 09/23/2016   Procedure: VIDEO BRONCHOSCOPY WITH ENDOBRONCHIAL NAVIGATION;  Surgeon: [name], MD;  Location: [facility];  Service: Thoracic;  Laterality: N/A;  . VIDEO BRONCHOSCOPY WITH ENDOBRONCHIAL ULTRASOUND N/A 09/23/2016   Procedure: VIDEO BRONCHOSCOPY WITH ENDOBRONCHIAL ULTRASOUND;  Surgeon: [name], MD;  Location: [facility] OR;  Service: Thoracic;  Laterality: N/A;   HPI:  83 year old male with non-small cell carcinoma of RML presented for VATS and RML lobectomy on 8/9. Per notes, patient coughing after eating and drinking, swallow evaluation ordered.    Assessment / Plan / Recommendation Clinical Impression  Patient presents with a functional oropharyngeal swallow. Baseline congested cough which continued during po trials appearing unrelated to swallowing function. If coughing during po intake persists with resolving baseline cough, place re consult.  SLP Visit Diagnosis: Dysphagia, unspecified (R13.10)    Aspiration Risk       Diet Recommendation Regular;Thin liquid   Liquid Administration via: Cup;Straw Medication Administration: Whole meds with liquid Supervision: Patient able to self feed Compensations: Slow rate;Small sips/bites Postural Changes: Seated upright at 90 degrees;Remain upright for at least 30 minutes after po intake    Other  Recommendations Oral Care Recommendations: Oral care BID   Follow up Recommendations None        Swallow Study   General HPI: 83 year old male with non-small cell carcinoma of RML presented for VATS  and RML lobectomy on 8/9. Per notes, patient coughing after eating and drinking, swallow evaluation ordered.  Type of Study: Bedside Swallow Evaluation Previous Swallow Assessment: none Diet Prior to this Study: Regular;Thin liquids Temperature Spikes Noted:  No Respiratory Status: Room air History of Recent Intubation: Yes Length of Intubations (days):  (surgery only) Date extubated: 10/07/16 Behavior/Cognition: Alert;Cooperative;Pleasant mood Oral Cavity Assessment: Within Functional Limits Oral Care Completed by SLP: No Oral Cavity - Dentition: Adequate natural dentition Vision: Functional for self-feeding Self-Feeding Abilities: Able to feed self Patient Positioning: Upright in chair Baseline Vocal Quality: Hoarse (per patient, worsened with coughing ) Volitional Cough: Strong Volitional Swallow: Able to elicit    Oral/Motor/Sensory Function Overall Oral Motor/Sensory Function: Within functional limits   Ice Chips Ice chips: Not tested   Thin Liquid Thin Liquid: Within functional limits Presentation: Cup;Self Fed;Straw    Nectar Thick Nectar Thick Liquid: Not tested   Honey Thick Honey Thick Liquid: Not tested   Puree Puree: Within functional limits Presentation: Self Fed;Spoon   Solid   GO   Solid: Within functional limits Presentation: [place], CCC-SLP [phone]  [name] [name] 10/11/2016,10:41 AM

## 2016-10-11 NOTE — Progress Notes (Signed)
      [address]       [place] [address]             [phone]      4 Days Post-Op Procedure(s) (LRB): VIDEO ASSISTED THORACOSCOPY (VATS)/RIGHT MIDDLE LOBECTOMY (Right) CYSTOSCOPY (N/A) FOLEY  CATHETER PLACEMENT (N/A)   Subjective:  Patient states he is not doing good this morning.  He states he developed vomiting and diarrhea this morning.  Objective: Vital signs in last 24 hours: Temp:  [98.1 F (36.7 C)-99.1 F (37.3 C)] 98.1 F (36.7 C) (08/13 0412) Pulse Rate:  [63-74] 63 (08/13 0412) Cardiac Rhythm: Normal sinus rhythm (08/13 0703) Resp:  [14-23] 19 (08/13 0412) BP: (96-124)/(48-58) 124/51 (08/13 0412) SpO2:  [92 %-96 %] 95 % (08/13 0412)  Intake/Output from previous day: 08/12 0701 - 08/13 0700 In: 1420 [P.O.:1120] Out: 1 [Stool:1]  General appearance: alert, cooperative and no distress Heart: regular rate and rhythm Lungs: clear to auscultation bilaterally Abdomen: soft, non-tender; bowel sounds normal; no masses,  no organomegaly Extremities: extremities normal, atraumatic, no cyanosis or edema Wound: clean and dry  Lab Results:  Recent Labs  10/09/16 0405  WBC 6.6  HGB 10.5*  HCT 31.4*  PLT 154   BMET:  Recent Labs  10/09/16 0405  NA 138  K 3.7  CL 107  CO2 25  GLUCOSE 92  BUN 17  CREATININE 1.08  CALCIUM 8.0*    PT/INR: No results for input(s): LABPROT, INR in the last 72 hours. ABG    Component Value Date/Time   PHART 7.336 (L) 10/08/2016 0330   HCO3 21.8 10/08/2016 0330   ACIDBASEDEF 3.1 (H) 10/08/2016 0330   O2SAT 94.6 10/08/2016 0330   CBG (last 3)  No results for input(s): GLUCAP in the last 72 hours.  Assessment/Plan: S/P Procedure(s) (LRB): VIDEO ASSISTED THORACOSCOPY (VATS)/RIGHT MIDDLE LOBECTOMY (Right) CYSTOSCOPY (N/A) FOLEY  CATHETER PLACEMENT (N/A)  1. CV- A. Fib with conversion to NSR on Amiodarone, yesterday, remains in NSR this morning- will d/c IV Amiodarone, start oral regimen 2. Pulm-  no acute issues, continue IS 3. GU- BPH, foley remains in place, on home medications, will plan to remove foley catheter this evening prior to bed 4. GI- + N/V/D this morning will d/c stool softener, 1 isolated fever around midnight, patient states he would like TUMS... Will increase IV fluids to 50 cc/hr to ensure patient doesn't get dehydrated 5. Dispo- patient with N/V Diarrhea, all stool softeners stopped, will increase fluids, TUMS added per patient request.. Will attempt foley removal this evening  LOS: 4 days    Ahmed Prima, Adalynd Donahoe 10/11/2016

## 2016-10-11 NOTE — Care Management Important Message (Signed)
Important Message  Patient Details  Name: Raymond Logan MRN: [id number] Date of Birth: 08-04-1930   Medicare Important Message Given:  Yes    Sindhu Nguyen Abena 10/11/2016, 11:26 AM

## 2016-10-12 DIAGNOSIS — C342 Malignant neoplasm of middle lobe, bronchus or lung: Secondary | ICD-10-CM

## 2016-10-12 MED ORDER — GUAIFENESIN-DM 100-10 MG/5ML PO SYRP: 5.0000 mL | ORAL_SOLUTION | ORAL | 0 refills | Status: DC | PRN

## 2016-10-12 MED ORDER — ACETAMINOPHEN 500 MG PO TABS: 1000.0000 mg | ORAL_TABLET | Freq: Four times a day (QID) | ORAL | 0 refills | Status: DC | PRN

## 2016-10-12 MED ORDER — AMIODARONE HCL 400 MG PO TABS: 400.0000 mg | ORAL_TABLET | Freq: Two times a day (BID) | ORAL | 1 refills | Status: DC

## 2016-10-12 MED ORDER — TRAMADOL HCL 50 MG PO TABS: 50.0000 mg | ORAL_TABLET | Freq: Four times a day (QID) | ORAL | 0 refills | Status: DC | PRN

## 2016-10-12 NOTE — Progress Notes (Signed)
Pt. Discharged to home with home health Pt. D/C'd via  With [facility] Discharge information reviewed and given along with RX  All personal belongings given to Pt.  Education discussed IV was d/c Tele d/c  home meds with daughter

## 2016-10-12 NOTE — Care Management Note (Signed)
Case Management Note  Patient Details  Name: Raymond Logan MRN: [id number] Date of Birth: [birth year]  Subjective/Objective:  From home, POD 1 VATS, Lobectomy, chest tube to water seal,  On clears.                Action/Plan: NCM will follow for dc needs.   Expected Discharge Date:  10/12/16               Expected Discharge Plan:  [place]  In-House Referral:  NA  Discharge planning Services  CM Consult  Post Acute Care Choice:  Home Health Choice offered to:  Patient, Adult Children  DME Arranged:    DME Agency:     HH Arranged:  RN [place] Agency:  [facility] (formerly [employer])  Status of Service:  Completed, signed off  If discussed at [employer], dates discussed:    Discharge Disposition: home/home health   Additional Comments:  10/12/16- 0950- Marvetta Gibbons RN, CM- pt for d/c home today- order [facility] for [facility]- spoke with pt and daughter at the bedside- choice offered for [facility] agency in [facility]- list provided - per daughter they would like to use [facility] [place] [facility]) - no DME needs noted- referral called to [facility] with [facility]- referral accepted- [facility] will try to see pt prior to leaving otherwise will call to f/u on referral and [facility] arrangements.   Dahlia Client [place], RN 10/12/2016, 9:58 AM [phone]

## 2016-10-12 NOTE — Discharge Instructions (Signed)
Video-Assisted Thoracic Surgery, Care After This sheet gives you information about how to care for yourself after your procedure. Your health care provider may also give you more specific instructions. If you have problems or questions, contact your health care provider. What can I expect after the procedure? After the procedure, it is common to have:  Some pain and soreness in your chest.  Pain when breathing in (inhaling) and coughing.  Constipation.  Fatigue.  Difficulty sleeping.  Follow these instructions at home: Preventing pneumonia  Take deep breaths or do breathing exercises as instructed by your health care provider. Doing this helps prevent lung infection (pneumonia).  Cough frequently. Coughing may cause discomfort, but it is important to clear mucus (phlegm) and expand your lungs. If it hurts to cough, hold a pillow against your chest or place the palms of both hands on top of the incision (use splinting) when you cough. This may help relieve discomfort.  If you were given an incentive spirometer, use it as directed. An incentive spirometer is a tool that measures how well you are filling your lungs with each breath.  Participate in pulmonary rehabilitation as directed by your health care provider. This is a program that combines education, exercise, and support from a team of specialists. The goal is to help you heal and get back to your normal activities as soon as possible. Medicines  Take over-the-counter or prescription medicines only as told by your health care provider.  If you have pain, take pain-relieving medicine before your pain becomes severe. This is important because if your pain is under control, you will be able to breathe and cough more comfortably.  If you were prescribed an antibiotic medicine, take it as told by your health care provider. Do not stop taking the antibiotic even if you start to feel better. Activity  Ask your health care provider what  activities are safe for you.  Avoid activities that use your chest muscles for at least 3-4 weeks.  Do not lift anything that is heavier than 10 lb (4.5 kg), or the limit that your health care provider tells you, until he or she says that it is safe. Incision care  Follow instructions from your health care provider about how to take care of your incision(s). Make sure you: ? Wash your hands with soap and water before you change your bandage (dressing). If soap and water are not available, use hand sanitizer. ? Change your dressing as told by your health care provider. ? Leave stitches (sutures), skin glue, or adhesive strips in place. These skin closures may need to stay in place for 2 weeks or longer. If adhesive strip edges start to loosen and curl up, you may trim the loose edges. Do not remove adhesive strips completely unless your health care provider tells you to do that.  Keep your dressing dry until it has been removed.  Check your incision area every day for signs of infection. Check for: ? Redness, swelling, or pain. ? Fluid or blood. ? Warmth. ? Pus or a bad smell. Bathing  Do not take baths, swim, or use a hot tub until your health care provider approves. You may take showers.  After your dressing has been removed, use soap and water to gently wash your incision area. Do not use anything else to clean your incision(s) unless your health care provider tells you to do this. Driving  Do not drive until your health care provider approves.  Do not drive or  use heavy machinery while taking prescription pain medicine. Eating and drinking  Eat a healthy, balanced diet as instructed by your health care provider. A healthy diet includes plenty of fresh fruits and vegetables, whole grains, and low-fat (lean) proteins.  Limit foods that are high in fat and processed sugars, such as fried and sweet foods.  Drink enough fluid to keep your urine clear or pale yellow. General  instructions  To prevent or treat constipation while you are taking prescription pain medicine, your health care provider may recommend that you: ? Take over-the-counter or prescription medicines. ? Eat foods that are high in fiber, such as beans, fresh fruits and vegetables, and whole grains.  Do not use any products that contain nicotine or tobacco, such as cigarettes and e-cigarettes. If you need help quitting, ask your health care provider.  Avoid secondhand smoke.  Wear compression stockings as told by your health care provider. These stockings help to prevent blood clots and reduce swelling in your legs.  If you have a chest tube, care for it as instructed by your health care provider. Do not travel by airplane during the 2 weeks after your chest tube is removed, or until your health care provider says that this is safe.  Keep all follow-up visits as told by your health care provider. This is important. Contact a health care provider if:  You have redness, swelling, or pain around an incision.  You have fluid or blood coming from an incision.  Your incision area feels warm to the touch.  You have pus or a bad smell coming from an incision.  You have a fever or chills.  You have nausea or vomiting.  You have pain that does not get better with medicine. Get help right away if:  You have chest pain.  Your heart is fluttering or beating rapidly.  You develop a rash.  You have shortness of breath or trouble breathing.  You are confused.  You have trouble speaking.  You feel weak, light-headed, or dizzy.  You faint. Summary  To help prevent lung infection (pneumonia), take deep breaths or do breathing exercises as instructed by your health care provider.  Cough frequently to clear mucus (phlegm) and expand your lungs. If it hurts to cough, hold a pillow against your chest or place the palms of both hands on top of the incision (use splinting) when you cough.  If  you have pain, take pain-relieving medicine before your pain becomes severe. This is important because if your pain is under control, you will be able to breathe and cough more comfortably.  Ask your health care provider what activities are safe for you. This information is not intended to replace advice given to you by your health care provider. Make sure you discuss any questions you have with your health care provider. Document Released: 06/12/2012 Document Revised: 01/26/2016 Document Reviewed: 01/26/2016 Elsevier Interactive Patient Education  2017 [place].    Atrial Fibrillation Atrial fibrillation is a type of heartbeat that is irregular or fast (rapid). If you have this condition, your heart keeps quivering in a weird (chaotic) way. This condition can make it so your heart cannot pump blood normally. Having this condition gives a person more risk for stroke, heart failure, and other heart problems. There are different types of atrial fibrillation. Talk with your doctor to learn about the type that you have. Follow these instructions at home:  Take over-the-counter and prescription medicines only as told by your doctor.  If your doctor prescribed a blood-thinning medicine, take it exactly as told. Taking too much of it can cause bleeding. If you do not take enough of it, you will not have the protection that you need against stroke and other problems.  Do not use any tobacco products. These include cigarettes, chewing tobacco, and e-cigarettes. If you need help quitting, ask your doctor.  If you have apnea (obstructive sleep apnea), manage it as told by your doctor.  Do not drink alcohol.  Do not drink beverages that have caffeine. These include coffee, soda, and tea.  Maintain a healthy weight. Do not use diet pills unless your doctor says they are safe for you. Diet pills may make heart problems worse.  Follow diet instructions as told by your doctor.  Exercise regularly  as told by your doctor.  Keep all follow-up visits as told by your doctor. This is important. Contact a doctor if:  You notice a change in the speed, rhythm, or strength of your heartbeat.  You are taking a blood-thinning medicine and you notice more bruising.  You get tired more easily when you move or exercise. Get help right away if:  You have pain in your chest or your belly (abdomen).  You have sweating or weakness.  You feel sick to your stomach (nauseous).  You notice blood in your throw up (vomit), poop (stool), or pee (urine).  You are short of breath.  You suddenly have swollen feet and ankles.  You feel dizzy.  Your suddenly get weak or numb in your face, arms, or legs, especially if it happens on one side of your body.  You have trouble talking, trouble understanding, or both.  Your face or your eyelid droops on one side. These symptoms may be an emergency. Do not wait to see if the symptoms will go away. Get medical help right away. Call your local emergency services (911 in the [place] Do not drive yourself to the hospital. This information is not intended to replace advice given to you by your health care provider. Make sure you discuss any questions you have with your health care provider. Document Released: 11/25/2007 Document Revised: 07/24/2015 Document Reviewed: 06/12/2014 Elsevier Interactive Patient Education  [employer].

## 2016-10-12 NOTE — Progress Notes (Signed)
      [address]       [place] [address]             [phone]      5 Days Post-Op Procedure(s) (LRB): VIDEO ASSISTED THORACOSCOPY (VATS)/RIGHT MIDDLE LOBECTOMY (Right) CYSTOSCOPY (N/A) FOLEY  CATHETER PLACEMENT (N/A)   Subjective:  Mr. Romanoski is feeling much better.  States he is ready to go home.  Objective: Vital signs in last 24 hours: Temp:  [98.2 F (36.8 C)-98.6 F (37 C)] 98.6 F (37 C) (08/14 0421) Pulse Rate:  [22-71] 22 (08/14 0421) Cardiac Rhythm: Normal sinus rhythm (08/13 1950) Resp:  [16-24] 18 (08/13 2118) BP: (138)/(65) 138/65 (08/13 2118) SpO2:  [91 %-98 %] 91 % (08/14 0421)  Intake/Output from previous day: 08/13 0701 - 08/14 0700 In: 1975.4 [P.O.:770; I.V.:1205.4] Out: 1300 [Urine:1300]  General appearance: alert, cooperative and no distress Heart: regular rate and rhythm Lungs: clear to auscultation bilaterally Abdomen: soft, non-tender; bowel sounds normal; no masses,  no organomegaly Extremities: extremities normal, atraumatic, no cyanosis or edema Wound: clean and dry  Lab Results: No results for input(s): WBC, HGB, HCT, PLT in the last 72 hours. BMET: No results for input(s): NA, K, CL, CO2, GLUCOSE, BUN, CREATININE, CALCIUM in the last 72 hours.  PT/INR: No results for input(s): LABPROT, INR in the last 72 hours. ABG    Component Value Date/Time   PHART 7.336 (L) 10/08/2016 0330   HCO3 21.8 10/08/2016 0330   ACIDBASEDEF 3.1 (H) 10/08/2016 0330   O2SAT 94.6 10/08/2016 0330   CBG (last 3)  No results for input(s): GLUCAP in the last 72 hours.  Assessment/Plan: S/P Procedure(s) (LRB): VIDEO ASSISTED THORACOSCOPY (VATS)/RIGHT MIDDLE LOBECTOMY (Right) CYSTOSCOPY (N/A) FOLEY  CATHETER PLACEMENT (N/A)  1. CV- NSR, continue Amiodarone and will taper over next several weeks 2. Pulm- no acute issues, continue IS... CXR from 8/13 shows improvement of pneumothorax 3. GU- foley has been removed, patient voiding  without difficulty 4. GI- N/V/D resolved 5. Dispo- patient stable, looks great, hemodynamically stable in NSR, will d/c home today   LOS: 5 days    Ahmed Prima, [facility] 10/12/2016

## 2016-10-13 ENCOUNTER — Telehealth: Payer: Self-pay | Admitting: *Deleted

## 2016-10-13 NOTE — Telephone Encounter (Signed)
Transition Care Management Follow-up Telephone Call   Date discharged? 10/12/16   How have you been since you were released from the hospital? Pt states he is doing alright    Do you understand why you were in the hospital? YES   Do you understand the discharge instructions? YES   Where were you discharged to? Home   Items Reviewed:  Medications reviewed: YES  Allergies reviewed: YES  Dietary changes reviewed: YES  Referrals reviewed: YES, cardiothoracic appt w/Dr. Roxan Hockey 10/26/16   Functional Questionnaire:   Activities of Daily Living (ADLs):   He states he are independent in the following: feeding, grooming, toileting and dressing States they require assistance with the following: ambulation and continence   Any transportation issues/concerns?: NO, but he states they told him not to dive for at least 2 weeks   Any patient concerns? NO   Confirmed importance and date/time of follow-up visits scheduled YES, appt 10/27/16  Provider Appointment booked with Dr. Jenny Reichmann  Confirmed with patient if condition begins to worsen call PCP or go to the ER.  Patient was given the office number and encouraged to call back with question or concerns.  : YES

## 2016-10-14 ENCOUNTER — Telehealth: Payer: Self-pay | Admitting: Internal Medicine

## 2016-10-14 NOTE — Telephone Encounter (Signed)
Did receive referral for home health services.  States RN will be visiting patient on 8/17 to start patient on home health services.

## 2016-10-15 DIAGNOSIS — J449 Chronic obstructive pulmonary disease, unspecified: Secondary | ICD-10-CM | POA: Diagnosis not present

## 2016-10-15 DIAGNOSIS — N401 Enlarged prostate with lower urinary tract symptoms: Secondary | ICD-10-CM | POA: Diagnosis not present

## 2016-10-15 DIAGNOSIS — M48062 Spinal stenosis, lumbar region with neurogenic claudication: Secondary | ICD-10-CM | POA: Diagnosis not present

## 2016-10-15 DIAGNOSIS — I4891 Unspecified atrial fibrillation: Secondary | ICD-10-CM | POA: Diagnosis not present

## 2016-10-15 DIAGNOSIS — C342 Malignant neoplasm of middle lobe, bronchus or lung: Secondary | ICD-10-CM | POA: Diagnosis not present

## 2016-10-15 DIAGNOSIS — Z483 Aftercare following surgery for neoplasm: Secondary | ICD-10-CM | POA: Diagnosis not present

## 2016-10-15 NOTE — Telephone Encounter (Signed)
noted 

## 2016-10-19 DIAGNOSIS — C342 Malignant neoplasm of middle lobe, bronchus or lung: Secondary | ICD-10-CM | POA: Diagnosis not present

## 2016-10-19 DIAGNOSIS — N401 Enlarged prostate with lower urinary tract symptoms: Secondary | ICD-10-CM | POA: Diagnosis not present

## 2016-10-19 DIAGNOSIS — J449 Chronic obstructive pulmonary disease, unspecified: Secondary | ICD-10-CM | POA: Diagnosis not present

## 2016-10-19 DIAGNOSIS — Z483 Aftercare following surgery for neoplasm: Secondary | ICD-10-CM | POA: Diagnosis not present

## 2016-10-19 DIAGNOSIS — I4891 Unspecified atrial fibrillation: Secondary | ICD-10-CM | POA: Diagnosis not present

## 2016-10-19 DIAGNOSIS — M48062 Spinal stenosis, lumbar region with neurogenic claudication: Secondary | ICD-10-CM | POA: Diagnosis not present

## 2016-10-21 ENCOUNTER — Telehealth: Payer: Self-pay | Admitting: Internal Medicine

## 2016-10-21 DIAGNOSIS — I4891 Unspecified atrial fibrillation: Secondary | ICD-10-CM | POA: Diagnosis not present

## 2016-10-21 DIAGNOSIS — Z483 Aftercare following surgery for neoplasm: Secondary | ICD-10-CM | POA: Diagnosis not present

## 2016-10-21 DIAGNOSIS — C342 Malignant neoplasm of middle lobe, bronchus or lung: Secondary | ICD-10-CM | POA: Diagnosis not present

## 2016-10-21 DIAGNOSIS — N401 Enlarged prostate with lower urinary tract symptoms: Secondary | ICD-10-CM | POA: Diagnosis not present

## 2016-10-21 DIAGNOSIS — J449 Chronic obstructive pulmonary disease, unspecified: Secondary | ICD-10-CM | POA: Diagnosis not present

## 2016-10-21 DIAGNOSIS — M48062 Spinal stenosis, lumbar region with neurogenic claudication: Secondary | ICD-10-CM | POA: Diagnosis not present

## 2016-10-21 MED ORDER — TRAZODONE HCL 50 MG PO TABS: 25.0000 mg | ORAL_TABLET | Freq: Every evening | ORAL | 1 refills | Status: DC | PRN

## 2016-10-21 NOTE — Telephone Encounter (Signed)
Pharmacy called and said that his plan only covers 60 a year.

## 2016-10-21 NOTE — Telephone Encounter (Signed)
Pt wife called and said that pt needs his refill of his eszopiclone (LUNESTA) 2 MG TABS tablet [[id number]]  He has had cancer and not able to sleep, he is needing this med asap in order to rest per wife

## 2016-10-21 NOTE — Telephone Encounter (Signed)
lunesta not covered by insurance, so ok for trazodone qhs prn - done erx to [facility]

## 2016-10-21 NOTE — Telephone Encounter (Signed)
Notified pt MD sent rx to [facility] for Trazodone. Pt states he does not use [facility] pt uses [facility]. Called [facility] cancel script w/tech Dory Larsen) cancel script, and resent to [facility]...Raymond Logan

## 2016-10-25 ENCOUNTER — Other Ambulatory Visit: Payer: Self-pay | Admitting: Thoracic Surgery (Cardiothoracic Vascular Surgery)

## 2016-10-25 DIAGNOSIS — C3491 Malignant neoplasm of unspecified part of right bronchus or lung: Secondary | ICD-10-CM

## 2016-10-26 ENCOUNTER — Encounter: Payer: Self-pay | Admitting: Thoracic Surgery (Cardiothoracic Vascular Surgery)

## 2016-10-26 ENCOUNTER — Ambulatory Visit
Admission: RE | Admit: 2016-10-26 | Discharge: 2016-10-26 | Disposition: A | Discharge status: Home / Self Care | Payer: Medicare Other | Source: Ambulatory Visit | Attending: Thoracic Surgery (Cardiothoracic Vascular Surgery) | Admitting: Thoracic Surgery (Cardiothoracic Vascular Surgery)

## 2016-10-26 ENCOUNTER — Telehealth: Payer: Self-pay | Admitting: *Deleted

## 2016-10-26 ENCOUNTER — Ambulatory Visit (INDEPENDENT_AMBULATORY_CARE_PROVIDER_SITE_OTHER): Payer: Self-pay | Admitting: Thoracic Surgery (Cardiothoracic Vascular Surgery)

## 2016-10-26 VITALS — BP 110/64 | HR 66 | Resp 16 | Ht 69.0 in | Wt 150.8 lb

## 2016-10-26 DIAGNOSIS — Z902 Acquired absence of lung [part of]: Secondary | ICD-10-CM

## 2016-10-26 DIAGNOSIS — J90 Pleural effusion, not elsewhere classified: Secondary | ICD-10-CM | POA: Diagnosis not present

## 2016-10-26 DIAGNOSIS — C3491 Malignant neoplasm of unspecified part of right bronchus or lung: Secondary | ICD-10-CM

## 2016-10-26 DIAGNOSIS — I9789 Other postprocedural complications and disorders of the circulatory system, not elsewhere classified: Secondary | ICD-10-CM

## 2016-10-26 DIAGNOSIS — I4891 Unspecified atrial fibrillation: Secondary | ICD-10-CM

## 2016-10-26 DIAGNOSIS — C342 Malignant neoplasm of middle lobe, bronchus or lung: Secondary | ICD-10-CM

## 2016-10-26 NOTE — Progress Notes (Signed)
Raymond Logan [address]       [place] [address]             [phone]     HPI: Raymond Logan returns for a scheduled postoperative follow-up visit  He is an 83 year old gentleman who is being evaluated for a total knee replacement. On his preoperative chest x-ray was found to have a possible lung nodule. CT confirmed the nodule and it was hypermetabolic on PET/CT. Biopsy showed adenocarcinoma. On PET he had bilateral hilar areas of hypermetabolism. He was did not show any evidence of tumor in those areas.  I did a thoracoscopic right middle lobectomy on 10/07/2016. Tumor extended centrally near the main pulmonary artery. It had to be peeled off the main artery in that area. Vascular margin was positive but additional tissue was taken from that area and there was no tumor in that tissue. He had some mild confusion postoperatively and also had atrial fibrillation which converted to sinus rhythm with amiodarone. He was discharged on 10/12/2016.  After discharge she had some constipation at first few days but that is resolved. He has not been taking tramadol that has used some Tylenol #3 that he already had. He has been reluctant to increase his activities due to instructions from his home health nurse.  Past Medical History:  Diagnosis Date  . ALLERGIC RHINITIS 01/23/2007  . Arthritis    hands  . BENIGN PROSTATIC HYPERTROPHY 09/10/2006  . Chronic headaches   . Complication of anesthesia    difficulty voiding- if cath needed needs to placed with need to be done with scope  . COPD, MILD 04/03/2010   patient denies on preop visit of 08/02/2014   . Difficulty in urination   . FATIGUE 01/08/2008  . GERD 04/15/2010  . H. pylori infection    hx of   . Headache(784.0) 09/10/2006  . HOARSENESS 04/15/2010  . HYPERLIPIDEMIA 09/10/2006  . INSOMNIA-SLEEP DISORDER-UNSPEC 04/24/2009  . PEPTIC ULCER DISEASE 01/23/2007  . Pneumonia    1955    Current Outpatient Prescriptions  Medication Sig  Dispense Refill  . acetaminophen (TYLENOL) 500 MG tablet Take 2 tablets (1,000 mg total) by mouth every 6 (six) hours as needed. 30 tablet 0  . amiodarone (PACERONE) 400 MG tablet Take 1 tablet (400 mg total) by mouth 2 (two) times daily. For 7 Days, then decrease to 200 mg (1/2 tab) BID for 7 days, then decrease to 200 mg daily 90 tablet 1  . aspirin EC 325 MG tablet Take 325-650 mg by mouth every 6 (six) hours as needed (for pain.).     [name] finasteride (PROSCAR) 5 MG tablet Take 5 mg by mouth at bedtime.     . silodosin (RAPAFLO) 8 MG CAPS capsule Take 8 mg by mouth at bedtime.     . traZODone (DESYREL) 50 MG tablet Take 0.5-1 tablets (25-50 mg total) by mouth at bedtime as needed for sleep. 90 tablet 1  . aspirin-acetaminophen-caffeine (EXCEDRIN MIGRAINE) 250-250-65 MG tablet Take 2 tablets by mouth daily as needed for headache or migraine.    . Cal Carb-Mag Hydrox-Simeth (ROLAIDS ADVANCED) 1000-200-40 MG CHEW Chew 1 tablet by mouth 3 (three) times daily as needed (for acid reflux).    . calcium carbonate (TUMS - DOSED IN MG ELEMENTAL CALCIUM) 500 MG chewable tablet Chew 1 tablet by mouth 3 (three) times daily as needed (for acid reflux.).    [name] diclofenac sodium (VOLTAREN) 1 % GEL Apply 1-2 g topically  4 (four) times daily as needed (for knee pain.).    [name] gabapentin (NEURONTIN) 600 MG tablet Take 600 mg by mouth 2 (two) times daily as needed (for pain (typically once daily in the morning)).   0  . guaiFENesin-dextromethorphan (ROBITUSSIN DM) 100-10 MG/5ML syrup Take 5 mLs by mouth every 4 (four) hours as needed for cough. (Patient not taking: Reported on 10/26/2016) 118 mL 0  . traMADol (ULTRAM) 50 MG tablet Take 1 tablet (50 mg total) by mouth every 6 (six) hours as needed for moderate pain. (Patient not taking: Reported on 10/26/2016) 30 tablet 0   No current facility-administered medications for this visit.     Physical Exam BP 110/64 (BP Location: Right Arm, Patient Position: Sitting, Cuff  Size: Normal)   Pulse 66   Resp 16   Ht 5\' 9"  (1.753 m)   Wt 150 lb 12.8 oz (68.4 kg)   SpO2 96%   BMI 22.38 kg/m  83 year old man in no acute distress Alert and oriented 3 with no focal deficits Incision and chest tube sites clean dry and intact Lungs clear with equal breath sounds bilaterally Cardiac regular rate and rhythm normal S1 and S2 No peripheral edema  Diagnostic Tests: CHEST  2 VIEW  COMPARISON:  Chest x-ray of 10/11/2016  FINDINGS: The previous right apical pneumothorax appears to have resolved. Small bilateral pleural effusions remain. Abnormality of the right hilum is secondary to right middle lobe lobectomy and resultant volume loss and scarring. The heart is within normal limits in size. No bony abnormality is seen.  IMPRESSION: 1. Stable postoperative change with resolution of small right apical pneumothorax. 2. Bilateral small pleural effusions.   Electronically Signed   By: Raymond Logan M.D.   On: 10/26/2016 09:00 I personally reviewed the chest x-ray current findings noted above  Impression: Raymond Logan is an 83 year old man who is about 3 weeks out from a thoracoscopic right middle lobectomy. Postoperative course was palpated by atrial fibrillation and some confusion both of which resolved prior to discharge. He looks good overall the day. Unfortunately his home health nurse instructed him not to shower or to ambulate. I don't know where that came from. I instructed him to do both. I encouraged him to build into new activities gradually, but I want him to be as active as possible.  Atrial fibrillation postop- regular rate and rhythm today. He will follow-up with cardiology as an outpatient.  Lung cancer- final vascular margin reported positive, although I did take additional perivascular tissue from this area which turned out to be negative. I think in any event is a very close margin. He may benefit from adjuvant radiation therapy. I will  arrange for him to be seen in antibiotic oncology and radiation oncology to get their opinion.  Arthritis left knee- left knee has not been bothering him. He will need a total knee replacement in the future, be best to keep it off for a couple of months.   Plan: Appointment in [place] next week to see oncology and radiation oncology Follow-up in one month with PA and lateral chest x-ray to check on progress  [name], MD [facility] [phone]

## 2016-10-26 NOTE — Telephone Encounter (Signed)
Oncology Nurse Navigator Documentation  Oncology Nurse Navigator Flowsheets 10/26/2016  Navigator Location [facility]  Referral date to RadOnc/MedOnc 10/26/2016  Navigator Encounter Type Telephone/I received referral on Mr. Kampf today.  I called to set him up to be seen at [facility] next week.  I was unable to reach and left vm message with my name and phone number to call.   Telephone Outgoing Call  Patient Visit Type Surgery  Treatment Phase Pre-Tx/Tx Discussion  Barriers/Navigation Needs Coordination of Care  Interventions Coordination of Care  Coordination of Care Other  Acuity Level 2  Acuity Level 2 Other  Time Spent with Patient 30

## 2016-10-27 ENCOUNTER — Encounter: Payer: Self-pay | Admitting: Internal Medicine

## 2016-10-27 ENCOUNTER — Ambulatory Visit (INDEPENDENT_AMBULATORY_CARE_PROVIDER_SITE_OTHER): Payer: Medicare Other | Admitting: Internal Medicine

## 2016-10-27 ENCOUNTER — Telehealth: Payer: Self-pay | Admitting: *Deleted

## 2016-10-27 VITALS — BP 106/64 | HR 61 | Temp 97.8°F | Ht 69.0 in | Wt 154.0 lb

## 2016-10-27 DIAGNOSIS — Z23 Encounter for immunization: Secondary | ICD-10-CM

## 2016-10-27 DIAGNOSIS — R972 Elevated prostate specific antigen [PSA]: Secondary | ICD-10-CM | POA: Diagnosis not present

## 2016-10-27 DIAGNOSIS — G4700 Insomnia, unspecified: Secondary | ICD-10-CM

## 2016-10-27 DIAGNOSIS — J449 Chronic obstructive pulmonary disease, unspecified: Secondary | ICD-10-CM

## 2016-10-27 DIAGNOSIS — I4891 Unspecified atrial fibrillation: Secondary | ICD-10-CM | POA: Diagnosis not present

## 2016-10-27 DIAGNOSIS — I9789 Other postprocedural complications and disorders of the circulatory system, not elsewhere classified: Secondary | ICD-10-CM

## 2016-10-27 DIAGNOSIS — K5900 Constipation, unspecified: Secondary | ICD-10-CM | POA: Diagnosis not present

## 2016-10-27 NOTE — Assessment & Plan Note (Signed)
Ok to finish the [employer] as he has, ok for refill if reqeusted

## 2016-10-27 NOTE — Patient Instructions (Addendum)
You had the flu shot today  Your EKG was OK today  Please continue all other medications as before, and refills have been done if requested, including the miralax, tramadol and lunesta  Please have the pharmacy call with any other refills you may need.  Please keep your appointments with your specialists as you may have planned  Please return in 6 months, or sooner if needed,

## 2016-10-27 NOTE — Progress Notes (Signed)
Subjective:    Patient ID: Raymond Logan, male    DOB: [birth year], 83 y.o.   MRN: [id number]  HPI  Here to f/u after recent dx right lung cancer, s/p recent procedure per Dr Roxan Hockey and pain tolerable now on tramadol, complicated by post op afib now on tapering dose of amiodarone with d/c next week.  Pt denies chest pain, increased sob or doe, wheezing, orthopnea, PND, increased LE swelling, palpitations, dizziness or syncope.  Pt denies new neurological symptoms such as new headache, or facial or extremity weakness or numbness   Pt denies polydipsia, polyuria.   Pt denies fever, night sweats, loss of appetite, or other constitutional symptoms.  Wt is overall down several lbs.  Wt Readings from Last 3 Encounters:  10/27/16 154 lb (69.9 kg)  10/26/16 150 lb 12.8 oz (68.4 kg)  10/07/16 158 lb 11.7 oz (72 kg)  Had mild constipation on narcotic, now improved with miralax, plans to take the tramadol more frequently.  C/o persistent insomnia, but has 2 wks lunesta and wants to try get off after that.  Past Medical History:  Diagnosis Date  . ALLERGIC RHINITIS 01/23/2007  . Arthritis    hands  . BENIGN PROSTATIC HYPERTROPHY 09/10/2006  . Chronic headaches   . Complication of anesthesia    difficulty voiding- if cath needed needs to placed with need to be done with scope  . COPD, MILD 04/03/2010   patient denies on preop visit of 08/02/2014   . Difficulty in urination   . FATIGUE 01/08/2008  . GERD 04/15/2010  . H. pylori infection    hx of   . Headache(784.0) 09/10/2006  . HOARSENESS 04/15/2010  . HYPERLIPIDEMIA 09/10/2006  . INSOMNIA-SLEEP DISORDER-UNSPEC 04/24/2009  . PEPTIC ULCER DISEASE 01/23/2007  . Pneumonia    1955   Past Surgical History:  Procedure Laterality Date  . CATARACT EXTRACTION Left   . COLONOSCOPY    . CYSTOSCOPY N/A 10/07/2016   Procedure: CYSTOSCOPY;  Surgeon: Festus Aloe, MD;  Location: [facility];  Service: Urology;  Laterality: N/A;  . HEMORRHOID BANDING    .  INSERTION OF SUPRAPUBIC CATHETER N/A 10/07/2016   Procedure: FOLEY  CATHETER PLACEMENT;  Surgeon: Festus Aloe, MD;  Location: [facility];  Service: Urology;  Laterality: N/A;  . KNEE ARTHROSCOPY Left    torn meniscus  . LUMBAR LAMINECTOMY/DECOMPRESSION MICRODISCECTOMY Right 08/07/2014   Procedure: complete DECOMPRESSION LUMBAR LAMINECTOMY/MICRODISCECTOMY OF L4-L5 for spinal stenosis, DECOMPRESSION OF L3-L4;  Surgeon: Latanya Maudlin, MD;  Location: [facility];  Service: Orthopedics;  Laterality: Right;  . ROTATOR CUFF REPAIR Right   . SHOULDER ARTHROSCOPY     x3, L & R  . VIDEO ASSISTED THORACOSCOPY (VATS)/ LOBECTOMY Right 10/07/2016   Procedure: VIDEO ASSISTED THORACOSCOPY (VATS)/RIGHT MIDDLE LOBECTOMY;  Surgeon: [name], MD;  Location: [facility];  Service: Thoracic;  Laterality: Right;  [name] VIDEO BRONCHOSCOPY WITH ENDOBRONCHIAL NAVIGATION N/A 09/23/2016   Procedure: VIDEO BRONCHOSCOPY WITH ENDOBRONCHIAL NAVIGATION;  Surgeon: [name], MD;  Location: [facility];  Service: Thoracic;  Laterality: N/A;  . VIDEO BRONCHOSCOPY WITH ENDOBRONCHIAL ULTRASOUND N/A 09/23/2016   Procedure: VIDEO BRONCHOSCOPY WITH ENDOBRONCHIAL ULTRASOUND;  Surgeon: [name], MD;  Location: [facility];  Service: Thoracic;  Laterality: N/A;    reports that he quit smoking about 40 years ago. He has a 23.00 pack-year smoking history. He has never used smokeless tobacco. He reports that he does not drink alcohol or use drugs. family history includes Alzheimer's disease in his  mother and sister; Prostate cancer in his brother. Allergies  Allergen Reactions  . Alfuzosin Other (See Comments)    BP went crazy, dizzy, passing out   . Aricept [Donepezil Hcl] Other (See Comments)    Insomnia, headache  . Penicillins Hives    PATIENT HAS HAD A PCN REACTION WITH IMMEDIATE RASH, FACIAL/TONGUE/THROAT SWELLING, SOB, OR LIGHTHEADEDNESS WITH HYPOTENSION:  #  #  #  YES  #  #  #   Has patient had a PCN reaction causing severe  rash involving mucus membranes or skin necrosis:No Has patient had a PCN reaction that required hospitalization:No Has patient had a PCN reaction occurring within the last 10 years:No If all of the above answers are "NO", then may proceed with Cephalosporin use.    Current Outpatient Prescriptions on File Prior to Visit  Medication Sig Dispense Refill  . acetaminophen (TYLENOL) 500 MG tablet Take 2 tablets (1,000 mg total) by mouth every 6 (six) hours as needed. 30 tablet 0  . amiodarone (PACERONE) 400 MG tablet Take 1 tablet (400 mg total) by mouth 2 (two) times daily. For 7 Days, then decrease to 200 mg (1/2 tab) BID for 7 days, then decrease to 200 mg daily 90 tablet 1  . aspirin EC 325 MG tablet Take 325-650 mg by mouth every 6 (six) hours as needed (for pain.).     [name] aspirin-acetaminophen-caffeine (EXCEDRIN MIGRAINE) 250-250-65 MG tablet Take 2 tablets by mouth daily as needed for headache or migraine.    . Cal Carb-Mag Hydrox-Simeth (ROLAIDS ADVANCED) 1000-200-40 MG CHEW Chew 1 tablet by mouth 3 (three) times daily as needed (for acid reflux).    . calcium carbonate (TUMS - DOSED IN MG ELEMENTAL CALCIUM) 500 MG chewable tablet Chew 1 tablet by mouth 3 (three) times daily as needed (for acid reflux.).    [name] diclofenac sodium (VOLTAREN) 1 % GEL Apply 1-2 g topically 4 (four) times daily as needed (for knee pain.).    [name] finasteride (PROSCAR) 5 MG tablet Take 5 mg by mouth at bedtime.     . gabapentin (NEURONTIN) 600 MG tablet Take 600 mg by mouth 2 (two) times daily as needed (for pain (typically once daily in the morning)).   0  . guaiFENesin-dextromethorphan (ROBITUSSIN DM) 100-10 MG/5ML syrup Take 5 mLs by mouth every 4 (four) hours as needed for cough. 118 mL 0  . silodosin (RAPAFLO) 8 MG CAPS capsule Take 8 mg by mouth at bedtime.     . traMADol (ULTRAM) 50 MG tablet Take 1 tablet (50 mg total) by mouth every 6 (six) hours as needed for moderate pain. 30 tablet 0   No current  facility-administered medications on file prior to visit.    Review of Systems  Constitutional: Negative for other unusual diaphoresis or sweats HENT: Negative for ear discharge or swelling Eyes: Negative for other worsening visual disturbances Respiratory: Negative for stridor or other swelling  Gastrointestinal: Negative for worsening distension or other blood Genitourinary: Negative for retention or other urinary change Musculoskeletal: Negative for other MSK pain or swelling Skin: Negative for color change or other new lesions Neurological: Negative for worsening tremors and other numbness  Psychiatric/Behavioral: Negative for worsening agitation or other fatigue All other system neg per pt    Objective:   Physical Exam BP 106/64   Pulse 61   Temp 97.8 F (36.6 C) (Oral)   Ht 5\' 9"  (1.753 m)   Wt 154 lb (69.9 kg)   SpO2 96%  BMI 22.74 kg/m  VS noted,  Constitutional: Pt appears in NAD HENT: Head: NCAT.  Right Ear: External ear normal.  Left Ear: External ear normal.  Eyes: . Pupils are equal, round, and reactive to light. Conjunctivae and EOM are normal Nose: without d/c or deformity Neck: Neck supple. Gross normal ROM Cardiovascular: Normal rate and regular rhythm.   Pulmonary/Chest: Effort normal and breath sounds without rales or wheezing.  Abd:  Soft, NT, ND, + BS, no organomegaly Neurological: Pt is alert. At baseline orientation, motor grossly intact Skin: Skin is warm. No rashes, other new lesions, no LE edema Psychiatric: Pt behavior is normal without agitation  No other exam findings  ECG I have personally interpreted today Sinus brady 56 without acute changes    Assessment & Plan:

## 2016-10-27 NOTE — Assessment & Plan Note (Signed)
Stable, ok to continue wean off amiodarone as planned, ecg reviewed,  to f/u any worsening symptoms or concerns

## 2016-10-27 NOTE — Assessment & Plan Note (Signed)
Improved, to continue the miralax,  to f/u any worsening symptoms or concerns with incresaed tramadol use

## 2016-10-27 NOTE — Telephone Encounter (Signed)
Oncology Nurse Navigator Documentation  Oncology Nurse Navigator Flowsheets 10/27/2016  Navigator Location [facility]  Navigator Encounter Type Telephone/I called Raymond Logan today to schedule for [place].  I was unable to reach him.  I left vm message for him to call with my name and phone number.   Telephone Outgoing Call  Treatment Phase Pre-Tx/Tx Discussion  Barriers/Navigation Needs Coordination of Care  Interventions Coordination of Care  Coordination of Care Other  Time Spent with Patient 15

## 2016-10-27 NOTE — Assessment & Plan Note (Signed)
stable overall by history and exam, and pt to continue medical treatment as before,  to f/u any worsening symptoms or concerns 

## 2016-11-02 DIAGNOSIS — J449 Chronic obstructive pulmonary disease, unspecified: Secondary | ICD-10-CM | POA: Diagnosis not present

## 2016-11-02 DIAGNOSIS — I4891 Unspecified atrial fibrillation: Secondary | ICD-10-CM | POA: Diagnosis not present

## 2016-11-02 DIAGNOSIS — Z483 Aftercare following surgery for neoplasm: Secondary | ICD-10-CM | POA: Diagnosis not present

## 2016-11-02 DIAGNOSIS — M48062 Spinal stenosis, lumbar region with neurogenic claudication: Secondary | ICD-10-CM | POA: Diagnosis not present

## 2016-11-02 DIAGNOSIS — C342 Malignant neoplasm of middle lobe, bronchus or lung: Secondary | ICD-10-CM | POA: Diagnosis not present

## 2016-11-02 DIAGNOSIS — N401 Enlarged prostate with lower urinary tract symptoms: Secondary | ICD-10-CM | POA: Diagnosis not present

## 2016-11-03 DIAGNOSIS — N401 Enlarged prostate with lower urinary tract symptoms: Secondary | ICD-10-CM | POA: Diagnosis not present

## 2016-11-03 DIAGNOSIS — N3941 Urge incontinence: Secondary | ICD-10-CM | POA: Diagnosis not present

## 2016-11-03 DIAGNOSIS — R972 Elevated prostate specific antigen [PSA]: Secondary | ICD-10-CM | POA: Diagnosis not present

## 2016-11-04 ENCOUNTER — Encounter: Payer: Self-pay | Admitting: *Deleted

## 2016-11-04 ENCOUNTER — Other Ambulatory Visit: Payer: Self-pay | Admitting: Medical Oncology

## 2016-11-04 ENCOUNTER — Telehealth: Payer: Self-pay | Admitting: Internal Medicine

## 2016-11-04 ENCOUNTER — Ambulatory Visit (HOSPITAL_BASED_OUTPATIENT_CLINIC_OR_DEPARTMENT_OTHER): Payer: Medicare Other | Admitting: Internal Medicine

## 2016-11-04 ENCOUNTER — Ambulatory Visit
Admission: RE | Admit: 2016-11-04 | Discharge: 2016-11-04 | Disposition: A | Discharge status: Home / Self Care | Payer: Medicare Other | Source: Ambulatory Visit | Attending: Radiation Oncology | Admitting: Radiation Oncology

## 2016-11-04 ENCOUNTER — Encounter: Payer: Self-pay | Admitting: Internal Medicine

## 2016-11-04 ENCOUNTER — Other Ambulatory Visit (HOSPITAL_BASED_OUTPATIENT_CLINIC_OR_DEPARTMENT_OTHER): Payer: Medicare Other

## 2016-11-04 ENCOUNTER — Ambulatory Visit: Payer: Medicare Other | Attending: Internal Medicine | Admitting: Physical Therapy

## 2016-11-04 VITALS — BP 124/57 | HR 71 | Temp 97.9°F | Resp 17 | Ht 69.0 in | Wt 152.6 lb

## 2016-11-04 DIAGNOSIS — M25562 Pain in left knee: Secondary | ICD-10-CM | POA: Diagnosis not present

## 2016-11-04 DIAGNOSIS — Z510 Encounter for antineoplastic radiation therapy: Secondary | ICD-10-CM | POA: Insufficient documentation

## 2016-11-04 DIAGNOSIS — Z880 Allergy status to penicillin: Secondary | ICD-10-CM | POA: Insufficient documentation

## 2016-11-04 DIAGNOSIS — Z902 Acquired absence of lung [part of]: Secondary | ICD-10-CM

## 2016-11-04 DIAGNOSIS — Z820 Family history of epilepsy and other diseases of the nervous system: Secondary | ICD-10-CM | POA: Insufficient documentation

## 2016-11-04 DIAGNOSIS — N400 Enlarged prostate without lower urinary tract symptoms: Secondary | ICD-10-CM | POA: Insufficient documentation

## 2016-11-04 DIAGNOSIS — Z8711 Personal history of peptic ulcer disease: Secondary | ICD-10-CM | POA: Insufficient documentation

## 2016-11-04 DIAGNOSIS — Z79899 Other long term (current) drug therapy: Secondary | ICD-10-CM | POA: Insufficient documentation

## 2016-11-04 DIAGNOSIS — Z888 Allergy status to other drugs, medicaments and biological substances status: Secondary | ICD-10-CM | POA: Insufficient documentation

## 2016-11-04 DIAGNOSIS — Z79891 Long term (current) use of opiate analgesic: Secondary | ICD-10-CM | POA: Insufficient documentation

## 2016-11-04 DIAGNOSIS — G8929 Other chronic pain: Secondary | ICD-10-CM | POA: Insufficient documentation

## 2016-11-04 DIAGNOSIS — R293 Abnormal posture: Secondary | ICD-10-CM | POA: Diagnosis not present

## 2016-11-04 DIAGNOSIS — Z87891 Personal history of nicotine dependence: Secondary | ICD-10-CM | POA: Diagnosis not present

## 2016-11-04 DIAGNOSIS — C3491 Malignant neoplasm of unspecified part of right bronchus or lung: Secondary | ICD-10-CM

## 2016-11-04 DIAGNOSIS — E785 Hyperlipidemia, unspecified: Secondary | ICD-10-CM | POA: Insufficient documentation

## 2016-11-04 DIAGNOSIS — R29898 Other symptoms and signs involving the musculoskeletal system: Secondary | ICD-10-CM | POA: Insufficient documentation

## 2016-11-04 DIAGNOSIS — M48061 Spinal stenosis, lumbar region without neurogenic claudication: Secondary | ICD-10-CM | POA: Insufficient documentation

## 2016-11-04 DIAGNOSIS — Z9889 Other specified postprocedural states: Secondary | ICD-10-CM | POA: Insufficient documentation

## 2016-11-04 DIAGNOSIS — K219 Gastro-esophageal reflux disease without esophagitis: Secondary | ICD-10-CM | POA: Insufficient documentation

## 2016-11-04 DIAGNOSIS — C342 Malignant neoplasm of middle lobe, bronchus or lung: Secondary | ICD-10-CM | POA: Insufficient documentation

## 2016-11-04 DIAGNOSIS — Z7982 Long term (current) use of aspirin: Secondary | ICD-10-CM | POA: Insufficient documentation

## 2016-11-04 DIAGNOSIS — Z8042 Family history of malignant neoplasm of prostate: Secondary | ICD-10-CM | POA: Insufficient documentation

## 2016-11-04 DIAGNOSIS — J449 Chronic obstructive pulmonary disease, unspecified: Secondary | ICD-10-CM | POA: Insufficient documentation

## 2016-11-04 DIAGNOSIS — R51 Headache: Secondary | ICD-10-CM | POA: Insufficient documentation

## 2016-11-04 LAB — CBC WITH DIFFERENTIAL/PLATELET
BASO%: 0.6 % (ref 0.0–2.0)
BASOS ABS: 0 10*3/uL (ref 0.0–0.1)
EOS%: 3 % (ref 0.0–7.0)
Eosinophils Absolute: 0.2 10*3/uL (ref 0.0–0.5)
HEMATOCRIT: 34.5 % — AB (ref 38.4–49.9)
HGB: 11.7 g/dL — ABNORMAL LOW (ref 13.0–17.1)
LYMPH%: 22.9 % (ref 14.0–49.0)
MCH: 30.5 pg (ref 27.2–33.4)
MCHC: 33.9 g/dL (ref 32.0–36.0)
MCV: 89.8 fL (ref 79.3–98.0)
MONO#: 0.6 10*3/uL (ref 0.1–0.9)
MONO%: 11.5 % (ref 0.0–14.0)
NEUT#: 3.3 10*3/uL (ref 1.5–6.5)
NEUT%: 62 % (ref 39.0–75.0)
Platelets: 248 10*3/uL (ref 140–400)
RBC: 3.84 10*6/uL — ABNORMAL LOW (ref 4.20–5.82)
RDW: 12.5 % (ref 11.0–14.6)
WBC: 5.3 10*3/uL (ref 4.0–10.3)
lymph#: 1.2 10*3/uL (ref 0.9–3.3)
nRBC: 0 % (ref 0–0)

## 2016-11-04 LAB — COMPREHENSIVE METABOLIC PANEL
ALT: 10 U/L (ref 0–55)
AST: 14 U/L (ref 5–34)
Albumin: 2.9 g/dL — ABNORMAL LOW (ref 3.5–5.0)
Alkaline Phosphatase: 91 U/L (ref 40–150)
Anion Gap: 8 mEq/L (ref 3–11)
BUN: 15.4 mg/dL (ref 7.0–26.0)
CO2: 26 mEq/L (ref 22–29)
Calcium: 9.3 mg/dL (ref 8.4–10.4)
Chloride: 108 mEq/L (ref 98–109)
Creatinine: 1.2 mg/dL (ref 0.7–1.3)
EGFR: 57 mL/min/{1.73_m2} — ABNORMAL LOW (ref >90)
Glucose: 110 mg/dl (ref 70–140)
Potassium: 4 mEq/L (ref 3.5–5.1)
Sodium: 142 mEq/L (ref 136–145)
Total Bilirubin: 0.37 mg/dL (ref 0.20–1.20)
Total Protein: 6.8 g/dL (ref 6.4–8.3)

## 2016-11-04 NOTE — Progress Notes (Signed)
[place] [phone]   [phone] Multidisciplinary thoracic oncology clinic  CONSULT NOTE  REFERRING PHYSICIAN: Dr. Modesto Charon  REASON FOR CONSULTATION:  83 years old white male recently diagnosed with lung cancer.  HPI Raymond Logan is a 83 y.o. male with past medical history significant for COPD, GERD, benign prostatic hypertrophy, osteoarthritis, dyslipidemia the patient was undergoing evaluation for total knee replacement. Preoperative chest x-ray was performed on 08/12/2016 and it showed possible 3.0 cm spiculated right mid lung mass. This was followed by CT scan of the chest on 08/17/2016 and it showed a spiculated mass centrally in the right middle lobe measuring 3.0 x 2.3 x 2.9 cm compatible with pulmonary malignancy. The patient was referred to Dr. Melvyn Novas and a PET scan was performed on 08/25/2016 and it showed superior and 5 x 2.1 cm right middle lobe lesion extending along the right middle lobe bronchus, weakly hypermetabolic with SUV max of 3.1. There is a small hilar lymph node that was hypermetabolic with SUV max of 5.8. There was also small left hilar lymph node that was hypermetabolic with SUV max of 4.9. There was no evidence of metastatic disease involving the neck, supraclavicular regions, abdomen or pelvis. On 09/23/2016 the patient underwent electromagnetic navigational bronchoscopy with needle aspiration, brushing and transbronchial biopsies as well as endobronchial ultrasound under the care of Dr. Roxan Hockey. The final cytology of the right middle lobe biopsy ([id number]) showed malignant cells consistent with non-small cell carcinoma. The fine-needle aspiration of the 11R, 11L and 7 lymph nodes showed no evidence of malignancy. On 10/07/2016 the patient underwent right VATS with right middle lobectomy and mediastinal lymph node dissection under the care of Dr. Roxan Hockey. The final pathology ([id number]) showed invasive  adenocarcinoma, well-differentiated spanning 2.5 cm with tumor involving the vascular margin. The dissected lymph nodes were negative for malignancy. Dr. Roxan Hockey kindly referred the patient to the multidisciplinary thoracic oncology clinic today for evaluation and recommendation regarding treatment of his condition. When seen today the patient continues to have soreness in the right side of the chest worse with sneezing as well as mild shortness of breath and cough. He has no hemoptysis. He lost few pounds since his last visit. The patient denied having any headache or visual changes. He has no nausea, vomiting, diarrhea or constipation. Family history significant for mother died at age 45 was on Zymar, father died from complication of appendicitis and brother had prostate cancer. The patient is married and has 3 children. He was accompanied today by his wife Raymond Logan. He used to work in, Production assistant, radio in. He has a history of smoking for around 25 years but quit 4 years ago. He has no history of alcohol or drug abuse.  HPI  Past Medical History:  Diagnosis Date  . ALLERGIC RHINITIS 01/23/2007  . Arthritis    hands  . BENIGN PROSTATIC HYPERTROPHY 09/10/2006  . Chronic headaches   . Complication of anesthesia    difficulty voiding- if cath needed needs to placed with need to be done with scope  . COPD, MILD 04/03/2010   patient denies on preop visit of 08/02/2014   . Difficulty in urination   . FATIGUE 01/08/2008  . GERD 04/15/2010  . H. pylori infection    hx of   . Headache(784.0) 09/10/2006  . HOARSENESS 04/15/2010  . HYPERLIPIDEMIA 09/10/2006  . INSOMNIA-SLEEP DISORDER-UNSPEC 04/24/2009  . PEPTIC ULCER DISEASE 01/23/2007  . Pneumonia    1953    Past Surgical History:  Procedure Laterality Date  . CATARACT EXTRACTION Left   . COLONOSCOPY    . CYSTOSCOPY N/A 10/07/2016   Procedure: CYSTOSCOPY;  Surgeon: Festus Aloe, MD;  Location: [facility];  Service: Urology;  Laterality: N/A;  .  HEMORRHOID BANDING    . INSERTION OF SUPRAPUBIC CATHETER N/A 10/07/2016   Procedure: FOLEY  CATHETER PLACEMENT;  Surgeon: Festus Aloe, MD;  Location: [facility];  Service: Urology;  Laterality: N/A;  . KNEE ARTHROSCOPY Left    torn meniscus  . LUMBAR LAMINECTOMY/DECOMPRESSION MICRODISCECTOMY Right 08/07/2014   Procedure: complete DECOMPRESSION LUMBAR LAMINECTOMY/MICRODISCECTOMY OF L4-L5 for spinal stenosis, DECOMPRESSION OF L3-L4;  Surgeon: Latanya Maudlin, MD;  Location: [facility];  Service: Orthopedics;  Laterality: Right;  . ROTATOR CUFF REPAIR Right   . SHOULDER ARTHROSCOPY     x3, L & R  . VIDEO ASSISTED THORACOSCOPY (VATS)/ LOBECTOMY Right 10/07/2016   Procedure: VIDEO ASSISTED THORACOSCOPY (VATS)/RIGHT MIDDLE LOBECTOMY;  Surgeon: [name], MD;  Location: [facility];  Service: Thoracic;  Laterality: Right;  [name] VIDEO BRONCHOSCOPY WITH ENDOBRONCHIAL NAVIGATION N/A 09/23/2016   Procedure: VIDEO BRONCHOSCOPY WITH ENDOBRONCHIAL NAVIGATION;  Surgeon: [name], MD;  Location: [facility];  Service: Thoracic;  Laterality: N/A;  . VIDEO BRONCHOSCOPY WITH ENDOBRONCHIAL ULTRASOUND N/A 09/23/2016   Procedure: VIDEO BRONCHOSCOPY WITH ENDOBRONCHIAL ULTRASOUND;  Surgeon: [name], MD;  Location: [facility];  Service: Thoracic;  Laterality: N/A;    Family History  Problem Relation Age of Onset  . Prostate cancer Brother   . Alzheimer's disease Mother   . Alzheimer's disease Sister     Social History Social History  Substance Use Topics  . Smoking status: Former Smoker    Packs/day: 1.00    Years: 23.00    Quit date: 66  . Smokeless tobacco: Never Used  . Alcohol use No     Comment: rare    Allergies  Allergen Reactions  . Alfuzosin Other (See Comments)    BP went crazy, dizzy, passing out   . Aricept [Donepezil Hcl] Other (See Comments)    Insomnia, headache  . Penicillins Hives    PATIENT HAS HAD A PCN REACTION WITH IMMEDIATE RASH, FACIAL/TONGUE/THROAT SWELLING, SOB,  OR LIGHTHEADEDNESS WITH HYPOTENSION:  #  #  #  YES  #  #  #   Has patient had a PCN reaction causing severe rash involving mucus membranes or skin necrosis:No Has patient had a PCN reaction that required hospitalization:No Has patient had a PCN reaction occurring within the last 10 years:No If all of the above answers are "NO", then may proceed with Cephalosporin use.     Current Outpatient Prescriptions  Medication Sig Dispense Refill  . acetaminophen (TYLENOL) 500 MG tablet Take 2 tablets (1,000 mg total) by mouth every 6 (six) hours as needed. 30 tablet 0  . amiodarone (PACERONE) 400 MG tablet Take 1 tablet (400 mg total) by mouth 2 (two) times daily. For 7 Days, then decrease to 200 mg (1/2 tab) BID for 7 days, then decrease to 200 mg daily 90 tablet 1  . aspirin EC 325 MG tablet Take 325-650 mg by mouth every 6 (six) hours as needed (for pain.).     [name] aspirin-acetaminophen-caffeine (EXCEDRIN MIGRAINE) 250-250-65 MG tablet Take 2 tablets by mouth daily as needed for headache or migraine.    . Cal Carb-Mag Hydrox-Simeth (ROLAIDS ADVANCED) 1000-200-40 MG CHEW Chew 1 tablet by mouth 3 (three) times daily as needed (for acid reflux).    . calcium carbonate (TUMS -  DOSED IN MG ELEMENTAL CALCIUM) 500 MG chewable tablet Chew 1 tablet by mouth 3 (three) times daily as needed (for acid reflux.).    [name] diclofenac sodium (VOLTAREN) 1 % GEL Apply 1-2 g topically 4 (four) times daily as needed (for knee pain.).    [name] finasteride (PROSCAR) 5 MG tablet Take 5 mg by mouth at bedtime.     . gabapentin (NEURONTIN) 600 MG tablet Take 600 mg by mouth 2 (two) times daily as needed (for pain (typically once daily in the morning)).   0  . guaiFENesin-dextromethorphan (ROBITUSSIN DM) 100-10 MG/5ML syrup Take 5 mLs by mouth every 4 (four) hours as needed for cough. 118 mL 0  . silodosin (RAPAFLO) 8 MG CAPS capsule Take 8 mg by mouth at bedtime.     . traMADol (ULTRAM) 50 MG tablet Take 1 tablet (50 mg total) by mouth  every 6 (six) hours as needed for moderate pain. 30 tablet 0   No current facility-administered medications for this visit.     Review of Systems  Constitutional: positive for fatigue and weight loss Eyes: negative Ears, nose, mouth, throat, and face: negative Respiratory: positive for cough, dyspnea on exertion and pleurisy/chest pain Cardiovascular: negative Gastrointestinal: negative Genitourinary:negative Integument/breast: negative Hematologic/lymphatic: negative Musculoskeletal:negative Neurological: negative Behavioral/Psych: negative Endocrine: negative Allergic/Immunologic: negative  Physical Exam  XNA:TFTDD, healthy, no distress, well nourished and well developed SKIN: skin color, texture, turgor are normal, no rashes or significant lesions HEAD: Normocephalic, No masses, lesions, tenderness or abnormalities EYES: normal, PERRLA, Conjunctiva are pink and non-injected EARS: External ears normal, Canals clear OROPHARYNX:no exudate, no erythema and lips, buccal mucosa, and tongue normal  NECK: supple, no adenopathy, no JVD LYMPH:  no palpable lymphadenopathy, no hepatosplenomegaly LUNGS: clear to auscultation , and palpation HEART: regular rate & rhythm, no murmurs and no gallops ABDOMEN:abdomen soft, non-tender, normal bowel sounds and no masses or organomegaly BACK: Back symmetric, no curvature., No CVA tenderness EXTREMITIES:no joint deformities, effusion, or inflammation, no edema, no skin discoloration  NEURO: alert & oriented x 3 with fluent speech, no focal motor/sensory deficits  PERFORMANCE STATUS: ECOG 1  LABORATORY DATA: Lab Results  Component Value Date   WBC 5.3 11/04/2016   HGB 11.7 (L) 11/04/2016   HCT 34.5 (L) 11/04/2016   MCV 89.8 11/04/2016   PLT 248 11/04/2016      Chemistry      Component Value Date/Time   NA 138 10/09/2016 0405   K 3.7 10/09/2016 0405   CL 107 10/09/2016 0405   CO2 25 10/09/2016 0405   BUN 17 10/09/2016 0405    CREATININE 1.08 10/09/2016 0405      Component Value Date/Time   CALCIUM 8.0 (L) 10/09/2016 0405   ALKPHOS 47 10/09/2016 0405   AST 19 10/09/2016 0405   ALT 11 (L) 10/09/2016 0405   BILITOT 0.7 10/09/2016 0405       RADIOGRAPHIC STUDIES: Dg Chest 2 View  Result Date: 10/26/2016 CLINICAL DATA:  History of adenocarcinoma of the right lung, post right middle lobe lobectomy on 10/07/2016, some right-sided chest pain EXAM: CHEST  2 VIEW COMPARISON:  Chest x-ray of 10/11/2016 FINDINGS: The previous right apical pneumothorax appears to have resolved. Small bilateral pleural effusions remain. Abnormality of the right hilum is secondary to right middle lobe lobectomy and resultant volume loss and scarring. The heart is within normal limits in size. No bony abnormality is seen. IMPRESSION: 1. Stable postoperative change with resolution of small right apical pneumothorax. 2. Bilateral small pleural  effusions. Electronically Signed   By: Ivar Drape M.D.   On: 10/26/2016 09:00   Dg Chest 2 View  Result Date: 10/11/2016 CLINICAL DATA:  83 year old male post right lung surgery with chest tube removal and small pneumothorax. Subsequent encounter. EXAM: CHEST  2 VIEW COMPARISON:  Several prior exams most recent 10/10/2016. FINDINGS: Decrease in size of tiny right apical pneumothorax (less than 5%). On the lateral view there may be a very small loculated hydropneumothorax at the sternal manubrial junction level. Postsurgical changes right hilar region with small right-sided pleural effusion. Calcified mildly tortuous aorta. Right central line tip mid superior vena cava. Heart size within normal limits. No acute osseous abnormality. IMPRESSION: Decrease in size of tiny right apical pneumothorax (less than 5%). On the lateral view there may be a very small loculated hydropneumothorax at the sternal manubrial junction level. Postsurgical changes right hilar region with small right-sided pleural effusion. Aortic  Atherosclerosis (ICD10-I70.0). Electronically Signed   By: Genia Del M.D.   On: 10/11/2016 08:45   Dg Chest Port 1 View  Result Date: 10/10/2016 CLINICAL DATA:  Status post lobectomy on the right EXAM: PORTABLE CHEST 1 VIEW COMPARISON:  10/09/2016 FINDINGS: Thoracostomy catheter has been removed in the interval. Right jugular central line remains. A small apical pneumothorax is identified. This is slightly more prominent than the previous exam. Continued blunting of the right costophrenic angle is noted. The left lung remains clear. Postoperative changes on the right are noted consistent with the given clinical history. IMPRESSION: Tiny right apical pneumothorax following tube removal. These results will be called to the ordering clinician or representative by the Radiologist Assistant, and communication documented in the PACS or zVision Dashboard. Electronically Signed   By: Inez Catalina M.D.   On: 10/10/2016 07:37   Dg Chest Port 1 View  Result Date: 10/09/2016 CLINICAL DATA:  Right middle lobectomy 2 days ago. EXAM: PORTABLE CHEST 1 VIEW COMPARISON:  One-view chest x-ray 10/08/2016 FINDINGS: The heart size is normal. And right-sided chest tubes in place. A right IJ line remains. There is no pneumothorax. A small right pleural effusion and right basilar airspace disease have slightly increased. Left lung is clear. Postsurgical changes are again noted with right shoulder. IMPRESSION: 1. Stable right-sided chest tube without pneumothorax. 2. Slight increase in right pleural effusion and basilar opacities, likely atelectasis. Electronically Signed   By: San Morelle M.D.   On: 10/09/2016 07:29   Dg Chest Port 1 View  Result Date: 10/08/2016 CLINICAL DATA:  Chest tube EXAM: PORTABLE CHEST 1 VIEW COMPARISON:  Yesterday FINDINGS: Right chest tube is stable. No pneumothorax. Right jugular venous catheter is stable. Postoperative changes in the right lung are stable with scattered scarring. Left  lung is clear. Low volumes. Normal vascularity. Upper normal heart size. IMPRESSION: Stable right chest tube without pneumothorax. No evidence of CHF or pneumonia. Electronically Signed   By: Marybelle Killings M.D.   On: 10/08/2016 07:52   Dg Chest Port 1 View  Result Date: 10/07/2016 CLINICAL DATA:  Post right lobectomy. EXAM: PORTABLE CHEST 1 VIEW COMPARISON:  10/05/2016 FINDINGS: Examination demonstrates a right-sided chest tube in adequate position. Right IJ central venous catheter has tip over the SVC. There is a surgical suture line over the lateral right base as well as multiple surgical sutures over the right perihilar region. Lungs are hypoinflated and otherwise clear. Cardiomediastinal silhouette and remainder of the exam is unchanged. IMPRESSION: No acute cardiopulmonary disease. Postsurgical changes as described over the right lung  and perihilar region. Right-sided chest tube in adequate position. Electronically Signed   By: Marin Olp M.D.   On: 10/07/2016 13:06    ASSESSMENT: This is a very pleasant 83 years old white male recently diagnosed with a stage IA3 (T1c, N0, M0) non-small cell lung cancer, adenocarcinoma status post right middle lobectomy with lymph node dissection with very close vascular resection margin diagnosed in July 2018.   PLAN: I had a lengthy discussion with the patient and his wife today about his current disease is stage, prognosis and treatment options. I personally and independently reviewed his previous record, pathology reports as well as imaging studies. I explained to the patient that there is no survival benefit for adjuvant systemic chemotherapy for patient with a stage IA non-small cell lung cancer and the current standard of care is observation and close monitoring. Because of the close vascular margin, the patient is at risk for local disease recurrence. I recommended for him to see Dr. [name] for consideration of adjuvant radiotherapy to this area. I will  arrange for the patient to come back for follow-up visit in 6 months for evaluation and repeat CT scan of the chest for restaging of his disease. He has no neurosurgical disorder and he has early stage disease. I would consider imaging studies of his brain if he becomes symptomatic. The patient was seen during the multidisciplinary thoracic oncology clinic today by medical oncology, radiation oncology, thoracic navigator, physical therapist and social worker. He was advised to call immediately if he has any concerning symptoms in the interval. The patient voices understanding of current disease status and treatment options and is in agreement with the current care plan. All questions were answered. The patient knows to call the clinic with any problems, questions or concerns. We can certainly see the patient much sooner if necessary.  Thank you so much for allowing me to participate in the care of Raymond Logan. I will continue to follow up the patient with you and assist in his care.  I spent 40 minutes counseling the patient face to face. The total time spent in the appointment was 60 minutes.  Disclaimer: This note was dictated with voice recognition software. Similar sounding words can inadvertently be transcribed and may not be corrected upon review.   Eilleen Kempf November 04, 2016, 1:58 PM

## 2016-11-04 NOTE — Therapy (Signed)
[place], [place], [address] Phone: [phone]   Fax:  [phone]  Physical Therapy Evaluation  Patient Details  Name: Raymond Logan MRN: [id number] Date of Birth: 1930/09/02 Referring Provider: Dr. Curt Bears  Encounter Date: 11/04/2016      PT End of Session - 11/04/16 1643    Visit Number 1   Number of Visits 1   PT Start Time 1500   PT Stop Time 1525   PT Time Calculation (min) 25 min   Activity Tolerance Patient tolerated treatment well   Behavior During Therapy [facility] for tasks assessed/performed      Past Medical History:  Diagnosis Date  . ALLERGIC RHINITIS 01/23/2007  . Arthritis    hands  . BENIGN PROSTATIC HYPERTROPHY 09/10/2006  . Chronic headaches   . Complication of anesthesia    difficulty voiding- if cath needed needs to placed with need to be done with scope  . COPD, MILD 04/03/2010   patient denies on preop visit of 08/02/2014   . Difficulty in urination   . FATIGUE 01/08/2008  . GERD 04/15/2010  . H. pylori infection    hx of   . Headache(784.0) 09/10/2006  . HOARSENESS 04/15/2010  . HYPERLIPIDEMIA 09/10/2006  . INSOMNIA-SLEEP DISORDER-UNSPEC 04/24/2009  . PEPTIC ULCER DISEASE 01/23/2007  . Pneumonia    1955    Past Surgical History:  Procedure Laterality Date  . CATARACT EXTRACTION Left   . COLONOSCOPY    . CYSTOSCOPY N/A 10/07/2016   Procedure: CYSTOSCOPY;  Surgeon: Festus Aloe, MD;  Location: [facility];  Service: Urology;  Laterality: N/A;  . HEMORRHOID BANDING    . INSERTION OF SUPRAPUBIC CATHETER N/A 10/07/2016   Procedure: FOLEY  CATHETER PLACEMENT;  Surgeon: Festus Aloe, MD;  Location: [facility];  Service: Urology;  Laterality: N/A;  . KNEE ARTHROSCOPY Left    torn meniscus  . LUMBAR LAMINECTOMY/DECOMPRESSION MICRODISCECTOMY Right 08/07/2014   Procedure: complete DECOMPRESSION LUMBAR LAMINECTOMY/MICRODISCECTOMY OF L4-L5 for spinal stenosis, DECOMPRESSION OF  L3-L4;  Surgeon: Latanya Maudlin, MD;  Location: [facility];  Service: Orthopedics;  Laterality: Right;  . ROTATOR CUFF REPAIR Right   . SHOULDER ARTHROSCOPY     x3, L & R  . VIDEO ASSISTED THORACOSCOPY (VATS)/ LOBECTOMY Right 10/07/2016   Procedure: VIDEO ASSISTED THORACOSCOPY (VATS)/RIGHT MIDDLE LOBECTOMY;  Surgeon: [name], MD;  Location: [facility];  Service: Thoracic;  Laterality: Right;  [name] VIDEO BRONCHOSCOPY WITH ENDOBRONCHIAL NAVIGATION N/A 09/23/2016   Procedure: VIDEO BRONCHOSCOPY WITH ENDOBRONCHIAL NAVIGATION;  Surgeon: [name], MD;  Location: [facility];  Service: Thoracic;  Laterality: N/A;  . VIDEO BRONCHOSCOPY WITH ENDOBRONCHIAL ULTRASOUND N/A 09/23/2016   Procedure: VIDEO BRONCHOSCOPY WITH ENDOBRONCHIAL ULTRASOUND;  Surgeon: [name], MD;  Location: [facility];  Service: Thoracic;  Laterality: N/A;    There were no vitals filed for this visit.       Subjective Assessment - 11/04/16 1530    Subjective Left knee is bone on bone; played basketball until he was 83 years old (now 83).   Patient is accompained by: Family member  wife of 32 years, Dorothy   Pertinent History Incidental finding of right middle lobe lung nodule on pre-op chest x-ray for knee surgery, which he wanted to have to be able toplay basketball again. He had bronchoscopy and then on 10/07/16, VATS fir right lower lobectomy for diagnosis of invasive adenocarcinoma.  Tumor had a positive vascular margin and spanned 2.5 cm. Pt. is most  likely to have XRT for that.  Ex-smoker who quit 1978. Hand arthritis, mild COPD, h/o left knee scope for torn meniscus; 07/2014 lumbar laminectomy/decompression microdiscectomy; h/o right RTC repair, left and right shoulder scopes.   Patient Stated Goals get info from all lung clinic providers   Currently in Pain? Yes   Pain Score 4   but intermittently up to 8/10.   Pain Location Chest   Pain Orientation Right;Anterior   Pain Type Surgical pain   Aggravating  Factors  sleeping on right side   Pain Relieving Factors resting            [facility] PT Assessment - 11/04/16 0001      Assessment   Medical Diagnosis rght middle lobe invasive adenocarcinoma   Referring Provider Dr. Curt Bears   Onset Date/Surgical Date 10/07/16  VATS right lower lobectomy   Prior Therapy none     Precautions   Precautions Other (comment)   Precaution Comments cancer precautions     Restrictions   Weight Bearing Restrictions No     Balance Screen   Has the patient fallen in the past 6 months No   Has the patient had a decrease in activity level because of a fear of falling?  No   Is the patient reluctant to leave their home because of a fear of falling?  No     Home Ecologist residence   Living Arrangements Spouse/significant other   Type of [place] Two level   Alternate Level Stairs-Rails --  yes     Prior Function   Level of Independence Independent   Leisure rode a recumbent bike until surgery; used to play basketball until age 83     Cognition   Overall Cognitive Status Within Functional Limits for tasks assessed  but chart notes mild cognitive impairment     Observation/Other Assessments   Observations unremarkable     Coordination   Gross Motor Movements are Fluid and Coordinated Yes     Functional Tests   Functional tests Sit to Stand     Sit to Stand   Comments 10 times in 30 seconds, average for age  mild dyspnea following; also left knee discomfort     Posture/Postural Control   Posture/Postural Control Postural limitations   Postural Limitations Rounded Shoulders;Forward head;Flexed trunk     ROM / Strength   AROM / PROM / Strength AROM     AROM   Overall AROM Comments standing trunk AROM: in flexion, reaches approx. 10 inches fingertips to floor; extension limited 50%; sidebend limited 10% to right and 25% to left due to stretch on surgical site at right chest; right  rotation 10% limited, left 25% limited, also due to stretch on surgical incision     Ambulation/Gait   Ambulation/Gait Yes   Ambulation/Gait Assistance 6: Modified independent (Device/Increase time)  says he uses step-to pattern and railing on stairs; no A.D.   Gait Comments also reports some SOB with walking     Balance   Balance Assessed Yes     Dynamic Standing Balance   Dynamic Standing - Comments reaches forward 9 inches in standing, below average for age            Objective measurements completed on examination: See above findings.                  PT Education - 11/04/16 1642    Education provided Yes  Education Details energy conservation, walking, CURE article on staying active, Why exercise? handout, posture, breathing, PT info   Person(s) Educated Patient;Spouse   Methods Explanation;Handout   Comprehension Verbalized understanding               Lung Clinic Goals - 11/04/16 1651      Patient will be able to verbalize understanding of the benefit of exercise to decrease fatigue.   Status Achieved     Patient will be able to verbalize the importance of posture.   Status Achieved     Patient will be able to demonstrate diaphragmatic breathing for improved lung function.   Status Achieved     Patient will be able to verbalize understanding of the role of physical therapy to prevent functional decline and who to contact if physical therapy is needed.   Status Achieved              Plan - 11/04/16 1643    Clinical Impression Statement This is a pleasant who is active and would be more active except for recent VATS surgery for right lower lobectomy and left knee pain.  He has invasive adenocarcinoma and is expected to have adjuvant XRT. He has limited trunk AROM, at least in part from surger a month ago; forward posture and limited forward reach in standing; dyspnea with activity.   History and Personal Factors relevant to plan of  care: left knee pain, h/o lumbar laminectomy/discectomy two years ago, h/o shoulder surgeries   Clinical Presentation Evolving   Clinical Presentation due to: recent VATS lobectomy and expected to have adjuvant XRT starting soon   Clinical Decision Making Moderate   Rehab Potential Good   PT Frequency One time visit   PT Treatment/Interventions Patient/family education   PT Next Visit Plan none at this time   PT Home Exercise Plan stationary biking or walking   Consulted and Agree with Plan of Care Patient      Patient will benefit from skilled therapeutic intervention in order to improve the following deficits and impairments:  Cardiopulmonary status limiting activity, Decreased range of motion, Postural dysfunction, Pain  Visit Diagnosis: Abnormal posture - Plan: PT plan of care cert/re-cert  Chronic pain of left knee - Plan: PT plan of care cert/re-cert  Other symptoms and signs involving the musculoskeletal system - Plan: PT plan of care cert/re-cert      G-Codes - 41/32/44 1651    Functional Assessment Tool Used (Outpatient Only) clnical judgement   Functional Limitation Changing and maintaining body position   Changing and Maintaining Body Position Current Status (W1027) At least 1 percent but less than 20 percent impaired, limited or restricted   Changing and Maintaining Body Position Goal Status (O5366) At least 1 percent but less than 20 percent impaired, limited or restricted   Changing and Maintaining Body Position Discharge Status (Y4034) At least 1 percent but less than 20 percent impaired, limited or restricted       Problem List Patient Active Problem List   Diagnosis Date Noted  . Constipation 10/27/2016  . Postoperative atrial fibrillation ([place]) 10/26/2016  . Adenocarcinoma of lung, right ([place]) 10/12/2016  . S/P lobectomy of lung 10/07/2016  . Mass of middle lobe of right lung 08/26/2016  . Abnormal urinalysis 08/18/2016  . Hyperglycemia 03/11/2016  .  Spinal stenosis, lumbar region, with neurogenic claudication 08/07/2014  . Mild cognitive impairment 04/03/2014  . Back pain 12/01/2010  . Preventative health care 11/30/2010  . GERD 04/15/2010  .  HOARSENESS 04/15/2010  . COPD GOLD 0 04/03/2010  . Insomnia 04/24/2009  . FATIGUE 01/08/2008  . UNSPEC HEMORRHOIDS WITHOUT MENTION COMPLICATION 67/28/9791  . ALLERGIC RHINITIS 01/23/2007  . PEPTIC ULCER DISEASE 01/23/2007  . Hyperlipidemia 09/10/2006  . BENIGN PROSTATIC HYPERTROPHY 09/10/2006    Raymond Logan 11/04/2016, 4:53 PM  [place], [place], [address] Phone: [phone]   Fax:  [phone]  Name: Raymond Logan MRN: [id number] Date of Birth: 1930-03-30  Serafina Royals, PT 11/04/16 4:54 PM

## 2016-11-04 NOTE — Telephone Encounter (Signed)
Gave avs and calendar for march 2019 appoinment

## 2016-11-04 NOTE — Progress Notes (Signed)
Radiation Oncology         [phone] ________________________________  Name: Raymond Logan        MRN: [id number]  Date of Service: 11/04/2016 DOB: 13-Dec-1930  TD:DUKG, Hunt Oris, MD  [name], *     REFERRING PHYSICIAN: [name], *   DIAGNOSIS: The encounter diagnosis was Adenocarcinoma of lung, right ([place]).   HISTORY OF PRESENT ILLNESS: Raymond Logan is a 83 y.o. male seen at the request of Dr. Roxan Hockey for a newly diagnosed lung cancer. The patient was being evaluated preoperatively in anticipation for knee replacement when a CXR revealed a right lung nodule. Follow up CT scan on 08/17/16 revealed a 3 x 2.3 x 2.9 cm spiculated mass in the right middle lobe. No adenopathy was noted. He proceeded with PET scan on 08/25/16 and this confirmed hypermetabolic change without evidence of metastatic disease. He underwent VATS with right middle lobectomy on 10/07/16 and final pathology revealed a well differentiated adenocarcinoma spanning 2.5 cm, and positive disease at the vascular margin though the final margin was negative, and none of the 11 sampled nodes were involved. Given the close margin his case was discussed in conference and he comes to discuss options of adjuvant radiotherapy for local control.   PREVIOUS RADIATION THERAPY: No   PAST MEDICAL HISTORY:  Past Medical History:  Diagnosis Date  . ALLERGIC RHINITIS 01/23/2007  . Arthritis    hands  . BENIGN PROSTATIC HYPERTROPHY 09/10/2006  . Chronic headaches   . Complication of anesthesia    difficulty voiding- if cath needed needs to placed with need to be done with scope  . COPD, MILD 04/03/2010   patient denies on preop visit of 08/02/2014   . Difficulty in urination   . FATIGUE 01/08/2008  . GERD 04/15/2010  . H. pylori infection    hx of   . Headache(784.0) 09/10/2006  . HOARSENESS 04/15/2010  . HYPERLIPIDEMIA 09/10/2006  . INSOMNIA-SLEEP DISORDER-UNSPEC 04/24/2009  . PEPTIC ULCER DISEASE 01/23/2007   . Pneumonia    1955       PAST SURGICAL HISTORY: Past Surgical History:  Procedure Laterality Date  . CATARACT EXTRACTION Left   . COLONOSCOPY    . CYSTOSCOPY N/A 10/07/2016   Procedure: CYSTOSCOPY;  Surgeon: Festus Aloe, MD;  Location: [facility];  Service: Urology;  Laterality: N/A;  . HEMORRHOID BANDING    . INSERTION OF SUPRAPUBIC CATHETER N/A 10/07/2016   Procedure: FOLEY  CATHETER PLACEMENT;  Surgeon: Festus Aloe, MD;  Location: [facility];  Service: Urology;  Laterality: N/A;  . KNEE ARTHROSCOPY Left    torn meniscus  . LUMBAR LAMINECTOMY/DECOMPRESSION MICRODISCECTOMY Right 08/07/2014   Procedure: complete DECOMPRESSION LUMBAR LAMINECTOMY/MICRODISCECTOMY OF L4-L5 for spinal stenosis, DECOMPRESSION OF L3-L4;  Surgeon: Latanya Maudlin, MD;  Location: [facility];  Service: Orthopedics;  Laterality: Right;  . ROTATOR CUFF REPAIR Right   . SHOULDER ARTHROSCOPY     x3, L & R  . VIDEO ASSISTED THORACOSCOPY (VATS)/ LOBECTOMY Right 10/07/2016   Procedure: VIDEO ASSISTED THORACOSCOPY (VATS)/RIGHT MIDDLE LOBECTOMY;  Surgeon: [name], MD;  Location: [facility];  Service: Thoracic;  Laterality: Right;  [name] VIDEO BRONCHOSCOPY WITH ENDOBRONCHIAL NAVIGATION N/A 09/23/2016   Procedure: VIDEO BRONCHOSCOPY WITH ENDOBRONCHIAL NAVIGATION;  Surgeon: [name], MD;  Location: [facility];  Service: Thoracic;  Laterality: N/A;  . VIDEO BRONCHOSCOPY WITH ENDOBRONCHIAL ULTRASOUND N/A 09/23/2016   Procedure: VIDEO BRONCHOSCOPY WITH ENDOBRONCHIAL ULTRASOUND;  Surgeon: [name], MD;  Location: [facility];  Service: Thoracic;  Laterality: N/A;     FAMILY HISTORY:  Family History  Problem Relation Age of Onset  . Prostate cancer Brother   . Alzheimer's disease Mother   . Alzheimer's disease Sister      SOCIAL HISTORY:  reports that he quit smoking about 40 years ago. He has a 23.00 pack-year smoking history. He has never used smokeless tobacco. He reports that he does not drink alcohol or  use drugs. The paitent is married and lives in [place]. 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   ALLERGIES: Alfuzosin; Aricept [donepezil hcl]; and Penicillins   MEDICATIONS:  Current Outpatient Prescriptions  Medication Sig Dispense Refill  . acetaminophen (TYLENOL) 500 MG tablet Take 2 tablets (1,000 mg total) by mouth every 6 (six) hours as needed. 30 tablet 0  . amiodarone (PACERONE) 400 MG tablet Take 1 tablet (400 mg total) by mouth 2 (two) times daily. For 7 Days, then decrease to 200 mg (1/2 tab) BID for 7 days, then decrease to 200 mg daily 90 tablet 1  . aspirin EC 325 MG tablet Take 325-650 mg by mouth every 6 (six) hours as needed (for pain.).     [name] aspirin-acetaminophen-caffeine (EXCEDRIN MIGRAINE) 250-250-65 MG tablet Take 2 tablets by mouth daily as needed for headache or migraine.    . Cal Carb-Mag Hydrox-Simeth (ROLAIDS ADVANCED) 1000-200-40 MG CHEW Chew 1 tablet by mouth 3 (three) times daily as needed (for acid reflux).    . calcium carbonate (TUMS - DOSED IN MG ELEMENTAL CALCIUM) 500 MG chewable tablet Chew 1 tablet by mouth 3 (three) times daily as needed (for acid reflux.).    [name] diclofenac sodium (VOLTAREN) 1 % GEL Apply 1-2 g topically 4 (four) times daily as needed (for knee pain.).    [name] eszopiclone (LUNESTA) 1 MG TABS tablet Take 1 mg by mouth at bedtime as needed for sleep. Take immediately before bedtime    . finasteride (PROSCAR) 5 MG tablet Take 5 mg by mouth at bedtime.     . gabapentin (NEURONTIN) 600 MG tablet Take 600 mg by mouth 2 (two) times daily as needed (for pain (typically once daily in the morning)).   0  . guaiFENesin-dextromethorphan (ROBITUSSIN DM) 100-10 MG/5ML syrup Take 5 mLs by mouth every 4 (four) hours as needed for cough. (Patient not taking: Reported on 11/04/2016) 118 mL 0  . silodosin (RAPAFLO) 8 MG CAPS capsule Take 8 mg by mouth at bedtime.     . traMADol (ULTRAM) 50 MG tablet Take 1 tablet (50 mg total)  by mouth every 6 (six) hours as needed for moderate pain. 30 tablet 0   No current facility-administered medications for this encounter.      REVIEW OF SYSTEMS: On review of systems, the patient reports that he is doing well overall. He still has some chest wall soreness since surgery. He denies any chest pain, shortness of breath, cough, fevers, chills, night sweats, unintended weight changes. He denies any bowel or bladder disturbances, and denies abdominal pain, nausea or vomiting. He denies any new musculoskeletal or joint aches or pains. A complete review of systems is obtained and is otherwise negative.     PHYSICAL EXAM:  Wt Readings from Last 3 Encounters:  11/04/16 152 lb 9.6 oz (69.2 kg)  10/27/16 154 lb (69.9 kg)  10/26/16 150 lb 12.8 oz (68.4 kg)   Temp Readings from Last 3 Encounters:  11/04/16 97.9 F (36.6 C) (Oral)  10/27/16 97.8 F (  36.6 C) (Oral)  10/12/16 98.6 F (37 C) (Oral)   BP Readings from Last 3 Encounters:  11/04/16 (!) 124/57  10/27/16 106/64  10/26/16 110/64   Pulse Readings from Last 3 Encounters:  11/04/16 71  10/27/16 61  10/26/16 66     In general this is a well appearing elderly caucasian male in no acute distress. He is alert and oriented x4 and appropriate throughout the examination. HEENT reveals that the patient is normocephalic, atraumatic. EOMs are intact. PERRLA. Skin is intact without any evidence of gross lesions. Cardiovascular exam reveals a regular rate and rhythm, no clicks rubs or murmurs are auscultated. Chest is clear to auscultation bilaterally. Lymphatic assessment is performed and does not reveal any adenopathy in the cervical, supraclavicular, axillary, or inguinal chains. Abdomen has active bowel sounds in all quadrants and is intact. The abdomen is soft, non tender, non distended. Lower extremities are negative for pretibial pitting edema, deep calf tenderness, cyanosis or clubbing.   ECOG = 1  0 - Asymptomatic (Fully  active, able to carry on all predisease activities without restriction)  1 - Symptomatic but completely ambulatory (Restricted in physically strenuous activity but ambulatory and able to carry out work of a light or sedentary nature. For example, light housework, office work)  2 - Symptomatic, <50% in bed during the day (Ambulatory and capable of all self care but unable to carry out any work activities. Up and about more than 50% of waking hours)  3 - Symptomatic, >50% in bed, but not bedbound (Capable of only limited self-care, confined to bed or chair 50% or more of waking hours)  4 - Bedbound (Completely disabled. Cannot carry on any self-care. Totally confined to bed or chair)  5 - Death   Eustace Pen MM, Creech RH, Tormey DC, et al. [phone]). "Toxicity and response criteria of the [facility]". [place] Oncol. 5 (6): 649-55    LABORATORY DATA:  Lab Results  Component Value Date   WBC 5.3 11/04/2016   HGB 11.7 (L) 11/04/2016   HCT 34.5 (L) 11/04/2016   MCV 89.8 11/04/2016   PLT 248 11/04/2016   Lab Results  Component Value Date   NA 142 11/04/2016   K 4.0 11/04/2016   CL 107 10/09/2016   CO2 26 11/04/2016   Lab Results  Component Value Date   ALT 10 11/04/2016   AST 14 11/04/2016   ALKPHOS 91 11/04/2016   BILITOT 0.37 11/04/2016      RADIOGRAPHY: Dg Chest 2 View  Result Date: 10/26/2016 CLINICAL DATA:  History of adenocarcinoma of the right lung, post right middle lobe lobectomy on 10/07/2016, some right-sided chest pain EXAM: CHEST  2 VIEW COMPARISON:  Chest x-ray of 10/11/2016 FINDINGS: The previous right apical pneumothorax appears to have resolved. Small bilateral pleural effusions remain. Abnormality of the right hilum is secondary to right middle lobe lobectomy and resultant volume loss and scarring. The heart is within normal limits in size. No bony abnormality is seen. IMPRESSION: 1. Stable postoperative change with resolution of small right  apical pneumothorax. 2. Bilateral small pleural effusions. Electronically Signed   By: Ivar Drape M.D.   On: 10/26/2016 09:00   Dg Chest 2 View  Result Date: 10/11/2016 CLINICAL DATA:  83 year old male post right lung surgery with chest tube removal and small pneumothorax. Subsequent encounter. EXAM: CHEST  2 VIEW COMPARISON:  Several prior exams most recent 10/10/2016. FINDINGS: Decrease in size of tiny right apical pneumothorax (less than  5%). On the lateral view there may be a very small loculated hydropneumothorax at the sternal manubrial junction level. Postsurgical changes right hilar region with small right-sided pleural effusion. Calcified mildly tortuous aorta. Right central line tip mid superior vena cava. Heart size within normal limits. No acute osseous abnormality. IMPRESSION: Decrease in size of tiny right apical pneumothorax (less than 5%). On the lateral view there may be a very small loculated hydropneumothorax at the sternal manubrial junction level. Postsurgical changes right hilar region with small right-sided pleural effusion. Aortic Atherosclerosis (ICD10-I70.0). Electronically Signed   By: Genia Del M.D.   On: 10/11/2016 08:45   Dg Chest Port 1 View  Result Date: 10/10/2016 CLINICAL DATA:  Status post lobectomy on the right EXAM: PORTABLE CHEST 1 VIEW COMPARISON:  10/09/2016 FINDINGS: Thoracostomy catheter has been removed in the interval. Right jugular central line remains. A small apical pneumothorax is identified. This is slightly more prominent than the previous exam. Continued blunting of the right costophrenic angle is noted. The left lung remains clear. Postoperative changes on the right are noted consistent with the given clinical history. IMPRESSION: Tiny right apical pneumothorax following tube removal. These results will be called to the ordering clinician or representative by the Radiologist Assistant, and communication documented in the PACS or zVision Dashboard.  Electronically Signed   By: Inez Catalina M.D.   On: 10/10/2016 07:37   Dg Chest Port 1 View  Result Date: 10/09/2016 CLINICAL DATA:  Right middle lobectomy 2 days ago. EXAM: PORTABLE CHEST 1 VIEW COMPARISON:  One-view chest x-ray 10/08/2016 FINDINGS: The heart size is normal. And right-sided chest tubes in place. A right IJ line remains. There is no pneumothorax. A small right pleural effusion and right basilar airspace disease have slightly increased. Left lung is clear. Postsurgical changes are again noted with right shoulder. IMPRESSION: 1. Stable right-sided chest tube without pneumothorax. 2. Slight increase in right pleural effusion and basilar opacities, likely atelectasis. Electronically Signed   By: San Morelle M.D.   On: 10/09/2016 07:29   Dg Chest Port 1 View  Result Date: 10/08/2016 CLINICAL DATA:  Chest tube EXAM: PORTABLE CHEST 1 VIEW COMPARISON:  Yesterday FINDINGS: Right chest tube is stable. No pneumothorax. Right jugular venous catheter is stable. Postoperative changes in the right lung are stable with scattered scarring. Left lung is clear. Low volumes. Normal vascularity. Upper normal heart size. IMPRESSION: Stable right chest tube without pneumothorax. No evidence of CHF or pneumonia. Electronically Signed   By: Marybelle Killings M.D.   On: 10/08/2016 07:52   Dg Chest Port 1 View  Result Date: 10/07/2016 CLINICAL DATA:  Post right lobectomy. EXAM: PORTABLE CHEST 1 VIEW COMPARISON:  10/05/2016 FINDINGS: Examination demonstrates a right-sided chest tube in adequate position. Right IJ central venous catheter has tip over the SVC. There is a surgical suture line over the lateral right base as well as multiple surgical sutures over the right perihilar region. Lungs are hypoinflated and otherwise clear. Cardiomediastinal silhouette and remainder of the exam is unchanged. IMPRESSION: No acute cardiopulmonary disease. Postsurgical changes as described over the right lung and perihilar  region. Right-sided chest tube in adequate position. Electronically Signed   By: Marin Olp M.D.   On: 10/07/2016 13:06       IMPRESSION/PLAN: 1. Stage IB, pT1cN0, NSCLC, adenocarcinoma of the right middle lobe of the lung. Dr. [name] discusses the pathology findings and reviews the nature of invasive lung disease. The consensus from the thoracic conference included moving forward  with adjuvant treatment. He has recovered rather well since his surgery and is ready to move forward with radiation. We discussed the utility of local control of treatment. We discussed the risks, benefits, short, and long term effects of radiotherapy, and the patient is interested in proceeding. Dr. [name] discusses the delivery and logistics of radiotherapy and would anticipate a 6 week course of therapy. He will return for simulation on Wednesday, 11/10/16 at 2pm. Written consent is obtained and placed in the chart, a copy was provided to the patient.  The above documentation reflects my direct findings during this shared patient visit. Please see the separate note by Dr. [name] on this date for the remainder of the patient's plan of care.    Carola Rhine, PAC

## 2016-11-04 NOTE — Progress Notes (Signed)
[facility] Clinical Social Work  Clinical Social Work met with patient/family at [employer] appointment to offer support and assess for psychosocial needs.  [name] was accompanied by his spouse.  He has no concerns at this time.  CSW did provide information on [employer] program, as patient was interested in maintaining his fitness.    ONCBCN DISTRESS SCREENING 11/04/2016  Distress experienced in past week (1-10) 5  Emotional problem type Adjusting to illness  Physical Problem type Pain;Sleep/insomnia    Clinical Social Work briefly discussed Clinical Social Work role and [employer] support programs/services.  Clinical Social Work encouraged patient to call with any additional questions or concerns.   Maryjean Morn, MSW, LCSW, OSW-C Clinical Social Worker [facility] [phone]

## 2016-11-10 ENCOUNTER — Ambulatory Visit
Admission: RE | Admit: 2016-11-10 | Discharge: 2016-11-10 | Disposition: A | Discharge status: Home / Self Care | Payer: Medicare Other | Source: Ambulatory Visit | Attending: Radiation Oncology | Admitting: Radiation Oncology

## 2016-11-10 DIAGNOSIS — J449 Chronic obstructive pulmonary disease, unspecified: Secondary | ICD-10-CM | POA: Diagnosis not present

## 2016-11-10 DIAGNOSIS — Z820 Family history of epilepsy and other diseases of the nervous system: Secondary | ICD-10-CM | POA: Diagnosis not present

## 2016-11-10 DIAGNOSIS — E785 Hyperlipidemia, unspecified: Secondary | ICD-10-CM | POA: Diagnosis not present

## 2016-11-10 DIAGNOSIS — Z79891 Long term (current) use of opiate analgesic: Secondary | ICD-10-CM | POA: Diagnosis not present

## 2016-11-10 DIAGNOSIS — Z8042 Family history of malignant neoplasm of prostate: Secondary | ICD-10-CM | POA: Diagnosis not present

## 2016-11-10 DIAGNOSIS — Z880 Allergy status to penicillin: Secondary | ICD-10-CM | POA: Diagnosis not present

## 2016-11-10 DIAGNOSIS — N400 Enlarged prostate without lower urinary tract symptoms: Secondary | ICD-10-CM | POA: Diagnosis not present

## 2016-11-10 DIAGNOSIS — R51 Headache: Secondary | ICD-10-CM | POA: Diagnosis not present

## 2016-11-10 DIAGNOSIS — Z888 Allergy status to other drugs, medicaments and biological substances status: Secondary | ICD-10-CM | POA: Diagnosis not present

## 2016-11-10 DIAGNOSIS — M48061 Spinal stenosis, lumbar region without neurogenic claudication: Secondary | ICD-10-CM | POA: Diagnosis not present

## 2016-11-10 DIAGNOSIS — Z510 Encounter for antineoplastic radiation therapy: Secondary | ICD-10-CM | POA: Diagnosis not present

## 2016-11-10 DIAGNOSIS — C342 Malignant neoplasm of middle lobe, bronchus or lung: Secondary | ICD-10-CM

## 2016-11-10 DIAGNOSIS — Z79899 Other long term (current) drug therapy: Secondary | ICD-10-CM | POA: Diagnosis not present

## 2016-11-10 DIAGNOSIS — K219 Gastro-esophageal reflux disease without esophagitis: Secondary | ICD-10-CM | POA: Diagnosis not present

## 2016-11-10 DIAGNOSIS — Z7982 Long term (current) use of aspirin: Secondary | ICD-10-CM | POA: Diagnosis not present

## 2016-11-10 DIAGNOSIS — Z9889 Other specified postprocedural states: Secondary | ICD-10-CM | POA: Diagnosis not present

## 2016-11-10 DIAGNOSIS — Z8711 Personal history of peptic ulcer disease: Secondary | ICD-10-CM | POA: Diagnosis not present

## 2016-11-10 DIAGNOSIS — Z87891 Personal history of nicotine dependence: Secondary | ICD-10-CM | POA: Diagnosis not present

## 2016-11-10 NOTE — Progress Notes (Signed)
Patient here for ct simulation today , patient vitals and meds reconciled, wnl vitals, patient very tired and weak stated,  Here for right middle lung, 98% room air sats, has loss 10-15 lbs, but states appetite good, pain mild right middle loung 1:32 PM

## 2016-11-16 ENCOUNTER — Encounter (HOSPITAL_COMMUNITY): Payer: Self-pay | Admitting: Emergency Medicine

## 2016-11-16 DIAGNOSIS — Z79899 Other long term (current) drug therapy: Secondary | ICD-10-CM | POA: Diagnosis not present

## 2016-11-16 DIAGNOSIS — R51 Headache: Secondary | ICD-10-CM | POA: Diagnosis not present

## 2016-11-16 DIAGNOSIS — K219 Gastro-esophageal reflux disease without esophagitis: Secondary | ICD-10-CM | POA: Diagnosis not present

## 2016-11-16 DIAGNOSIS — Z510 Encounter for antineoplastic radiation therapy: Secondary | ICD-10-CM | POA: Diagnosis not present

## 2016-11-16 DIAGNOSIS — R109 Unspecified abdominal pain: Secondary | ICD-10-CM | POA: Diagnosis present

## 2016-11-16 DIAGNOSIS — Z87891 Personal history of nicotine dependence: Secondary | ICD-10-CM | POA: Diagnosis not present

## 2016-11-16 DIAGNOSIS — C342 Malignant neoplasm of middle lobe, bronchus or lung: Secondary | ICD-10-CM | POA: Diagnosis not present

## 2016-11-16 DIAGNOSIS — J449 Chronic obstructive pulmonary disease, unspecified: Secondary | ICD-10-CM | POA: Diagnosis not present

## 2016-11-16 DIAGNOSIS — R1084 Generalized abdominal pain: Secondary | ICD-10-CM | POA: Insufficient documentation

## 2016-11-16 DIAGNOSIS — N400 Enlarged prostate without lower urinary tract symptoms: Secondary | ICD-10-CM | POA: Diagnosis not present

## 2016-11-16 LAB — CBC
HEMATOCRIT: 34.9 % — AB (ref 39.0–52.0)
HEMOGLOBIN: 11.9 g/dL — AB (ref 13.0–17.0)
MCH: 30 pg (ref 26.0–34.0)
MCHC: 34.1 g/dL (ref 30.0–36.0)
MCV: 87.9 fL (ref 78.0–100.0)
Platelets: 462 10*3/uL — ABNORMAL HIGH (ref 150–400)
RBC: 3.97 MIL/uL — AB (ref 4.22–5.81)
RDW: 12.3 % (ref 11.5–15.5)
WBC: 7.6 10*3/uL (ref 4.0–10.5)

## 2016-11-16 LAB — COMPREHENSIVE METABOLIC PANEL
ALT: 13 U/L — AB (ref 17–63)
AST: 18 U/L (ref 15–41)
Albumin: 3.1 g/dL — ABNORMAL LOW (ref 3.5–5.0)
Alkaline Phosphatase: 90 U/L (ref 38–126)
Anion gap: 9 (ref 5–15)
BUN: 17 mg/dL (ref 6–20)
CHLORIDE: 103 mmol/L (ref 101–111)
CO2: 26 mmol/L (ref 22–32)
Calcium: 8.9 mg/dL (ref 8.9–10.3)
Creatinine, Ser: 1.27 mg/dL — ABNORMAL HIGH (ref 0.61–1.24)
GFR calc Af Amer: 58 mL/min — ABNORMAL LOW (ref >60)
GFR, EST NON AFRICAN AMERICAN: 50 mL/min — AB (ref >60)
Glucose, Bld: 118 mg/dL — ABNORMAL HIGH (ref 65–99)
Potassium: 3.9 mmol/L (ref 3.5–5.1)
Sodium: 138 mmol/L (ref 135–145)
TOTAL PROTEIN: 6.8 g/dL (ref 6.5–8.1)
Total Bilirubin: 0.5 mg/dL (ref 0.3–1.2)

## 2016-11-16 LAB — I-STAT CG4 LACTIC ACID, ED: Lactic Acid, Venous: 1.26 mmol/L (ref 0.5–1.9)

## 2016-11-16 LAB — LIPASE, BLOOD: LIPASE: 24 U/L (ref 11–51)

## 2016-11-16 NOTE — ED Triage Notes (Signed)
Pt arrives with increased weakness, loose stools and increased urinary frequency for about last week. Pt and wife poor historians. noncompliance with meds d/t weakness but was able to take an [place] last night. Reports every time he drinks or eats he goes to the bathroom.

## 2016-11-16 NOTE — ED Notes (Signed)
Unable to provide urine sample in triage. Will attempt to collect later.

## 2016-11-17 ENCOUNTER — Emergency Department (HOSPITAL_COMMUNITY)
Admission: EM | Admit: 2016-11-17 | Discharge: 2016-11-17 | Disposition: A | Discharge status: Home / Self Care | Payer: Medicare Other | Attending: Emergency Medicine | Admitting: Emergency Medicine

## 2016-11-17 ENCOUNTER — Emergency Department (HOSPITAL_COMMUNITY): Payer: Medicare Other

## 2016-11-17 DIAGNOSIS — R109 Unspecified abdominal pain: Secondary | ICD-10-CM | POA: Diagnosis not present

## 2016-11-17 DIAGNOSIS — R1084 Generalized abdominal pain: Secondary | ICD-10-CM | POA: Diagnosis not present

## 2016-11-17 MED ORDER — PANTOPRAZOLE SODIUM 40 MG IV SOLR
40.0000 mg | Freq: Once | INTRAVENOUS | Status: AC
  Administered 2016-11-17: 40 mg via INTRAVENOUS
  Filled 2016-11-17: qty 40

## 2016-11-17 MED ORDER — MORPHINE SULFATE (PF) 4 MG/ML IV SOLN
4.0000 mg | Freq: Once | INTRAVENOUS | Status: AC
  Administered 2016-11-17: 4 mg via INTRAVENOUS
  Filled 2016-11-17: qty 1

## 2016-11-17 MED ORDER — GI COCKTAIL ~~LOC~~
30.0000 mL | Freq: Once | ORAL | Status: AC
  Administered 2016-11-17: 30 mL via ORAL
  Filled 2016-11-17: qty 30

## 2016-11-17 MED ORDER — IOPAMIDOL (ISOVUE-300) INJECTION 61%
INTRAVENOUS | Status: AC
  Administered 2016-11-17: 100 mL
  Filled 2016-11-17: qty 100

## 2016-11-17 MED ORDER — ONDANSETRON HCL 4 MG/2ML IJ SOLN
4.0000 mg | Freq: Once | INTRAMUSCULAR | Status: AC
  Administered 2016-11-17: 4 mg via INTRAVENOUS
  Filled 2016-11-17: qty 2

## 2016-11-17 MED ORDER — SODIUM CHLORIDE 0.9 % IV BOLUS (SEPSIS)
1000.0000 mL | Freq: Once | INTRAVENOUS | Status: AC
  Administered 2016-11-17: 1000 mL via INTRAVENOUS

## 2016-11-17 MED ORDER — SUCRALFATE 1 G PO TABS: 1.0000 g | ORAL_TABLET | Freq: Three times a day (TID) | ORAL | 0 refills | Status: DC

## 2016-11-17 MED ORDER — OMEPRAZOLE 20 MG PO CPDR: 20.0000 mg | DELAYED_RELEASE_CAPSULE | Freq: Every day | ORAL | 0 refills | Status: DC

## 2016-11-17 NOTE — ED Provider Notes (Signed)
[facility] DEPT Provider Note   CSN: [id number] Arrival date & time: 11/16/16  1925     History   Chief Complaint Chief Complaint  Patient presents with  . Abdominal Pain    HPI Raymond Logan is a 83 y.o. male.  Patient presents to the ED with a chief complaint of abdominal pain.  He reports severe abdominal pain x 2 days.  He states that the pain is spread out across his abdomen.  He states that the pain does not radiate.  He denies any fever, chills, nausea, vomiting, or diarrhea.  He states that his pain is severe and worsened with palpation.  He has tried taking tums and rolaids with no relief.  He reports a history of stomach ulcers, but states this is much worse.   The history is provided by the patient. No language interpreter was used.    Past Medical History:  Diagnosis Date  . ALLERGIC RHINITIS 01/23/2007  . Arthritis    hands  . BENIGN PROSTATIC HYPERTROPHY 09/10/2006  . Chronic headaches   . Complication of anesthesia    difficulty voiding- if cath needed needs to placed with need to be done with scope  . COPD, MILD 04/03/2010   patient denies on preop visit of 08/02/2014   . Difficulty in urination   . FATIGUE 01/08/2008  . GERD 04/15/2010  . H. pylori infection    hx of   . Headache(784.0) 09/10/2006  . HOARSENESS 04/15/2010  . HYPERLIPIDEMIA 09/10/2006  . INSOMNIA-SLEEP DISORDER-UNSPEC 04/24/2009  . PEPTIC ULCER DISEASE 01/23/2007  . Pneumonia    1955    Patient Active Problem List   Diagnosis Date Noted  . Constipation 10/27/2016  . Postoperative atrial fibrillation ([place]) 10/26/2016  . Adenocarcinoma of lung, right ([place]) 10/12/2016  . S/P lobectomy of lung 10/07/2016  . Mass of middle lobe of right lung 08/26/2016  . Abnormal urinalysis 08/18/2016  . Hyperglycemia 03/11/2016  . Spinal stenosis, lumbar region, with neurogenic claudication 08/07/2014  . Mild cognitive impairment 04/03/2014  . Back pain 12/01/2010  . Preventative health care  11/30/2010  . GERD 04/15/2010  . HOARSENESS 04/15/2010  . COPD GOLD 0 04/03/2010  . Insomnia 04/24/2009  . FATIGUE 01/08/2008  . UNSPEC HEMORRHOIDS WITHOUT MENTION COMPLICATION 33/00/7622  . ALLERGIC RHINITIS 01/23/2007  . PEPTIC ULCER DISEASE 01/23/2007  . Hyperlipidemia 09/10/2006  . BENIGN PROSTATIC HYPERTROPHY 09/10/2006    Past Surgical History:  Procedure Laterality Date  . CATARACT EXTRACTION Left   . COLONOSCOPY    . CYSTOSCOPY N/A 10/07/2016   Procedure: CYSTOSCOPY;  Surgeon: Festus Aloe, MD;  Location: [facility];  Service: Urology;  Laterality: N/A;  . HEMORRHOID BANDING    . INSERTION OF SUPRAPUBIC CATHETER N/A 10/07/2016   Procedure: FOLEY  CATHETER PLACEMENT;  Surgeon: Festus Aloe, MD;  Location: [facility];  Service: Urology;  Laterality: N/A;  . KNEE ARTHROSCOPY Left    torn meniscus  . LUMBAR LAMINECTOMY/DECOMPRESSION MICRODISCECTOMY Right 08/07/2014   Procedure: complete DECOMPRESSION LUMBAR LAMINECTOMY/MICRODISCECTOMY OF L4-L5 for spinal stenosis, DECOMPRESSION OF L3-L4;  Surgeon: Latanya Maudlin, MD;  Location: [facility];  Service: Orthopedics;  Laterality: Right;  . ROTATOR CUFF REPAIR Right   . SHOULDER ARTHROSCOPY     x3, L & R  . VIDEO ASSISTED THORACOSCOPY (VATS)/ LOBECTOMY Right 10/07/2016   Procedure: VIDEO ASSISTED THORACOSCOPY (VATS)/RIGHT MIDDLE LOBECTOMY;  Surgeon: [name], MD;  Location: [facility];  Service: Thoracic;  Laterality: Right;  [name] VIDEO BRONCHOSCOPY WITH ENDOBRONCHIAL NAVIGATION N/A  09/23/2016   Procedure: VIDEO BRONCHOSCOPY WITH ENDOBRONCHIAL NAVIGATION;  Surgeon: [name], MD;  Location: [facility];  Service: Thoracic;  Laterality: N/A;  . VIDEO BRONCHOSCOPY WITH ENDOBRONCHIAL ULTRASOUND N/A 09/23/2016   Procedure: VIDEO BRONCHOSCOPY WITH ENDOBRONCHIAL ULTRASOUND;  Surgeon: [name], MD;  Location: [facility];  Service: Thoracic;  Laterality: N/A;       Home Medications    Prior to Admission medications   Medication  Sig Start Date End Date Taking? Authorizing Provider  acetaminophen (TYLENOL) 500 MG tablet Take 2 tablets (1,000 mg total) by mouth every 6 (six) hours as needed. 10/12/16   Barrett, Lodema Hong, PA-C  amiodarone (PACERONE) 400 MG tablet Take 1 tablet (400 mg total) by mouth 2 (two) times daily. For 7 Days, then decrease to 200 mg (1/2 tab) BID for 7 days, then decrease to 200 mg daily Patient not taking: Reported on 11/10/2016 10/12/16   Barrett, Lodema Hong, PA-C  aspirin EC 325 MG tablet Take 325-650 mg by mouth every 6 (six) hours as needed (for pain.).     [provider]  aspirin-acetaminophen-caffeine (EXCEDRIN MIGRAINE) [phone] MG tablet Take 2 tablets by mouth daily as needed for headache or migraine.    [provider]  Cal Carb-Mag Hydrox-Simeth (ROLAIDS ADVANCED) 1000-200-40 MG CHEW Chew 1 tablet by mouth 3 (three) times daily as needed (for acid reflux).    [provider]  calcium carbonate (TUMS - DOSED IN MG ELEMENTAL CALCIUM) 500 MG chewable tablet Chew 1 tablet by mouth 3 (three) times daily as needed (for acid reflux.).    [provider]  diclofenac sodium (VOLTAREN) 1 % GEL Apply 1-2 g topically 4 (four) times daily as needed (for knee pain.).    [provider]  eszopiclone (LUNESTA) 1 MG TABS tablet Take 1 mg by mouth at bedtime as needed for sleep. Take immediately before bedtime    [provider]  finasteride (PROSCAR) 5 MG tablet Take 5 mg by mouth at bedtime.     [provider]  gabapentin (NEURONTIN) 600 MG tablet Take 600 mg by mouth 2 (two) times daily as needed (for pain (typically once daily in the morning)).  07/30/14   [provider]  guaiFENesin-dextromethorphan (ROBITUSSIN DM) 100-10 MG/5ML syrup Take 5 mLs by mouth every 4 (four) hours as needed for cough. 10/12/16   Barrett, Lodema Hong, PA-C  silodosin (RAPAFLO) 8 MG CAPS capsule Take 8 mg by mouth at bedtime.     [provider]  traMADol  (ULTRAM) 50 MG tablet Take 1 tablet (50 mg total) by mouth every 6 (six) hours as needed for moderate pain. 10/12/16   Barrett, Lodema Hong, PA-C    Family History Family History  Problem Relation Age of Onset  . Prostate cancer Brother   . Alzheimer's disease Mother   . Alzheimer's disease Sister     Social History Social History  Substance Use Topics  . Smoking status: Former Smoker    Packs/day: 1.00    Years: 23.00    Quit date: 53  . Smokeless tobacco: Never Used  . Alcohol use No     Comment: rare     Allergies   Alfuzosin; Aricept [donepezil hcl]; and Penicillins   Review of Systems Review of Systems  All other systems reviewed and are negative.    Physical Exam Updated Vital Signs BP 126/67 (BP Location: Left Arm)   Pulse 87   Temp 98.7 F (37.1 C)   Resp (!)  22   Ht 5\' 9"  (1.753 m)   Wt 69.9 kg (154 lb)   SpO2 98%   BMI 22.74 kg/m   Physical Exam  Constitutional: He is oriented to person, place, and time. He appears well-developed and well-nourished.  HENT:  Head: Normocephalic and atraumatic.  Eyes: Pupils are equal, round, and reactive to light. Conjunctivae and EOM are normal. Right eye exhibits no discharge. Left eye exhibits no discharge. No scleral icterus.  Neck: Normal range of motion. Neck supple. No JVD present.  Cardiovascular: Normal rate, regular rhythm and normal heart sounds.  Exam reveals no gallop and no friction rub.   No murmur heard. Pulmonary/Chest: Effort normal and breath sounds normal. No respiratory distress. He has no wheezes. He has no rales. He exhibits no tenderness.  Abdominal: Soft. He exhibits no distension and no mass. There is tenderness. There is no rebound and no guarding.  TTP in the RUQ and LLQ  Musculoskeletal: Normal range of motion. He exhibits no edema or tenderness.  Neurological: He is alert and oriented to person, place, and time.  Skin: Skin is warm and dry.  Psychiatric: He has a normal mood and affect.  His behavior is normal. Judgment and thought content normal.  Nursing note and vitals reviewed.    ED Treatments / Results  Labs (all labs ordered are listed, but only abnormal results are displayed) Labs Reviewed  COMPREHENSIVE METABOLIC PANEL - Abnormal; Notable for the following:       Result Value   Glucose, Bld 118 (*)    Creatinine, Ser 1.27 (*)    Albumin 3.1 (*)    ALT 13 (*)    GFR calc non Af Amer 50 (*)    GFR calc Af Amer 58 (*)    All other components within normal limits  CBC - Abnormal; Notable for the following:    RBC 3.97 (*)    Hemoglobin 11.9 (*)    HCT 34.9 (*)    Platelets 462 (*)    All other components within normal limits  LIPASE, BLOOD  URINALYSIS, ROUTINE W REFLEX MICROSCOPIC  I-STAT CG4 LACTIC ACID, ED  I-STAT CG4 LACTIC ACID, ED    EKG  EKG Interpretation None       Radiology No results found.  Procedures Procedures (including critical care time)  Medications Ordered in ED Medications  morphine 4 MG/ML injection 4 mg (not administered)  ondansetron (ZOFRAN) injection 4 mg (not administered)     Initial Impression / Assessment and Plan / ED Course  I have reviewed the triage vital signs and the nursing notes.  Pertinent labs & imaging results that were available during my care of the patient were reviewed by me and considered in my medical decision making (see chart for details).     Patient with severe abdominal pain.  Labs are fairly reassuring, however he does have a significant amount of reproducible tenderness.  Will check CT.  Patient seen by and discussed with Dr. Dina Rich, who agrees with the plan.  CT is negative for acute abdominal findings.  CT does show right pleural effusion.  Patient has close follow-up with his doctors, and is receiving radiation therapy for lung cancer.  Will follow-up with his doctor for this.  Will treat with omeprazole and recommend close follow-up with PCP.  Final Clinical Impressions(s) /  ED Diagnoses   Final diagnoses:  Generalized abdominal pain    New Prescriptions New Prescriptions   OMEPRAZOLE (PRILOSEC) 20 MG CAPSULE  Take 1 capsule (20 mg total) by mouth daily.   SUCRALFATE (CARAFATE) 1 G TABLET    Take 1 tablet (1 g total) by mouth 4 (four) times daily -  with meals and at bedtime.     Montine Circle, PA-C 11/17/16 [id number]    Merryl Hacker, MD 11/17/16 [phone]

## 2016-11-17 NOTE — ED Notes (Signed)
Patient c/o abd. Pain onset 3 days ago denies n/d. Was told to come to ED 3 days ago however he stated he thought it would go away. States he is suppose to start radiation on Thurs. States everything he eats he gets gas.

## 2016-11-18 ENCOUNTER — Ambulatory Visit
Admission: RE | Admit: 2016-11-18 | Discharge: 2016-11-18 | Disposition: A | Discharge status: Home / Self Care | Payer: Medicare Other | Source: Ambulatory Visit | Attending: Radiation Oncology | Admitting: Radiation Oncology

## 2016-11-18 DIAGNOSIS — R918 Other nonspecific abnormal finding of lung field: Secondary | ICD-10-CM

## 2016-11-18 DIAGNOSIS — J449 Chronic obstructive pulmonary disease, unspecified: Secondary | ICD-10-CM | POA: Diagnosis not present

## 2016-11-18 DIAGNOSIS — Z510 Encounter for antineoplastic radiation therapy: Secondary | ICD-10-CM | POA: Diagnosis not present

## 2016-11-18 DIAGNOSIS — C342 Malignant neoplasm of middle lobe, bronchus or lung: Secondary | ICD-10-CM | POA: Diagnosis not present

## 2016-11-18 DIAGNOSIS — N400 Enlarged prostate without lower urinary tract symptoms: Secondary | ICD-10-CM | POA: Diagnosis not present

## 2016-11-18 DIAGNOSIS — R51 Headache: Secondary | ICD-10-CM | POA: Diagnosis not present

## 2016-11-18 DIAGNOSIS — K219 Gastro-esophageal reflux disease without esophagitis: Secondary | ICD-10-CM | POA: Diagnosis not present

## 2016-11-18 MED ORDER — SONAFINE EX EMUL
1.0000 "application " | Freq: Two times a day (BID) | CUTANEOUS | Status: DC
  Administered 2016-11-18: 1 via TOPICAL

## 2016-11-18 NOTE — Progress Notes (Signed)
Pt education done, radiation therapy and you book, my business card given, side effects, skin irritation, fatigue,pain, difficulty swallowing, sore throat, nausea, may need to eat 5-6 smaller meals instead of 3, and snacks in between, boost,ensure, luke warm showers, unscented soap, ni rubbing scrubbing or scratching skin araea that is treated with radiation, sees MD weekly and prn, exercise, get plenty rest,sleep, teach back given .11:44 AM

## 2016-11-18 NOTE — Progress Notes (Signed)
Pt education done, radiation therapy and you book, my business card given, side effects, skin irritation, fatigue,pain, difficulty swallowing, sore throat, nausea, may need to eat 5-6 smaller meals instead of 3, and snacks in between, boost,ensure, luke warm showers, unscented soap, ni rubbing scrubbing or scratching skin araea that is treated with radiation, sees MD weekly and prn, e

## 2016-11-19 ENCOUNTER — Ambulatory Visit
Admission: RE | Admit: 2016-11-19 | Discharge: 2016-11-19 | Disposition: A | Discharge status: Home / Self Care | Payer: Medicare Other | Source: Ambulatory Visit | Attending: Radiation Oncology | Admitting: Radiation Oncology

## 2016-11-19 DIAGNOSIS — K219 Gastro-esophageal reflux disease without esophagitis: Secondary | ICD-10-CM | POA: Diagnosis not present

## 2016-11-19 DIAGNOSIS — N400 Enlarged prostate without lower urinary tract symptoms: Secondary | ICD-10-CM | POA: Diagnosis not present

## 2016-11-19 DIAGNOSIS — J449 Chronic obstructive pulmonary disease, unspecified: Secondary | ICD-10-CM | POA: Diagnosis not present

## 2016-11-19 DIAGNOSIS — Z510 Encounter for antineoplastic radiation therapy: Secondary | ICD-10-CM | POA: Diagnosis not present

## 2016-11-19 DIAGNOSIS — C342 Malignant neoplasm of middle lobe, bronchus or lung: Secondary | ICD-10-CM | POA: Diagnosis not present

## 2016-11-19 DIAGNOSIS — R51 Headache: Secondary | ICD-10-CM | POA: Diagnosis not present

## 2016-11-22 ENCOUNTER — Ambulatory Visit
Admission: RE | Admit: 2016-11-22 | Discharge: 2016-11-22 | Disposition: A | Discharge status: Home / Self Care | Payer: Medicare Other | Source: Ambulatory Visit | Attending: Radiation Oncology | Admitting: Radiation Oncology

## 2016-11-22 ENCOUNTER — Telehealth: Payer: Self-pay | Admitting: Gastroenterology

## 2016-11-22 DIAGNOSIS — K219 Gastro-esophageal reflux disease without esophagitis: Secondary | ICD-10-CM | POA: Diagnosis not present

## 2016-11-22 DIAGNOSIS — R51 Headache: Secondary | ICD-10-CM | POA: Diagnosis not present

## 2016-11-22 DIAGNOSIS — Z510 Encounter for antineoplastic radiation therapy: Secondary | ICD-10-CM | POA: Diagnosis not present

## 2016-11-22 DIAGNOSIS — N400 Enlarged prostate without lower urinary tract symptoms: Secondary | ICD-10-CM | POA: Diagnosis not present

## 2016-11-22 DIAGNOSIS — J449 Chronic obstructive pulmonary disease, unspecified: Secondary | ICD-10-CM | POA: Diagnosis not present

## 2016-11-22 DIAGNOSIS — C342 Malignant neoplasm of middle lobe, bronchus or lung: Secondary | ICD-10-CM | POA: Diagnosis not present

## 2016-11-22 NOTE — Telephone Encounter (Signed)
Patient wife called stating patient is having some gi symptoms and wants to be seen by Dr.Jacobs. Former Dr.Patterson pt but wife sees Dr.Jaocbs. Does Dr.Jacobs accept patient?

## 2016-11-22 NOTE — Telephone Encounter (Signed)
Dr Jacobs is this ok? 

## 2016-11-23 ENCOUNTER — Ambulatory Visit
Admission: RE | Admit: 2016-11-23 | Discharge: 2016-11-23 | Disposition: A | Discharge status: Home / Self Care | Payer: Medicare Other | Source: Ambulatory Visit | Attending: Radiation Oncology | Admitting: Radiation Oncology

## 2016-11-23 DIAGNOSIS — Z510 Encounter for antineoplastic radiation therapy: Secondary | ICD-10-CM | POA: Diagnosis not present

## 2016-11-23 DIAGNOSIS — N400 Enlarged prostate without lower urinary tract symptoms: Secondary | ICD-10-CM | POA: Diagnosis not present

## 2016-11-23 DIAGNOSIS — R51 Headache: Secondary | ICD-10-CM | POA: Diagnosis not present

## 2016-11-23 DIAGNOSIS — J449 Chronic obstructive pulmonary disease, unspecified: Secondary | ICD-10-CM | POA: Diagnosis not present

## 2016-11-23 DIAGNOSIS — C342 Malignant neoplasm of middle lobe, bronchus or lung: Secondary | ICD-10-CM | POA: Diagnosis not present

## 2016-11-23 DIAGNOSIS — K219 Gastro-esophageal reflux disease without esophagitis: Secondary | ICD-10-CM | POA: Diagnosis not present

## 2016-11-23 NOTE — Telephone Encounter (Signed)
i'm happy to see him at next available ROV.  If he needs to be seen sooner please add to my office wait list and offer extender appt.  Thanks

## 2016-11-23 NOTE — Progress Notes (Signed)
  Radiation Oncology         [phone] ________________________________  Name: SANJITH SIWEK MRN: [id number]  Date: 11/10/2016  DOB: 1930/12/12  RESPIRATORY MOTION MANAGEMENT SIMULATION  NARRATIVE:  In order to account for effect of respiratory motion on target structures and other organs in the planning and delivery of radiotherapy, this patient underwent respiratory motion management simulation.  To accomplish this, when the patient was brought to the CT simulation planning suite, 4D respiratoy motion management CT images were obtained.  The CT images were loaded into the planning software.  Then, using a variety of tools including Cine, MIP, and standard views, the target volume and planning target volumes (PTV) were delineated.  Avoidance structures were contoured.  Treatment planning then occurred.  Dose volume histograms were generated and reviewed for each of the requested structure.  The resulting plan was carefully reviewed and approved today.  ------------------------------------------------  Jodelle Gross, MD, PhD

## 2016-11-23 NOTE — Progress Notes (Signed)
  Radiation Oncology         [phone]) [phone] ________________________________  Name: Raymond Logan MRN: [id number]  Date: 11/10/2016  DOB: Oct 02, 1930  SIMULATION AND TREATMENT PLANNING NOTE  DIAGNOSIS:     ICD-10-CM   1. Malignant neoplasm of middle lobe of right lung ([facility]) C34.2      Site:  righ lung  NARRATIVE:  The patient was brought to the [facility].  Identity was confirmed.  All relevant records and images related to the planned course of therapy were reviewed.   Written consent to proceed with treatment was confirmed which was freely given after reviewing the details related to the planned course of therapy had been reviewed with the patient.  Then, the patient was set-up in a stable reproducible  supine position for radiation therapy.  CT images were obtained.  Surface markings were placed.    Medically necessary complex treatment device(s) for immobilization:  Customized vac lock bag.   The CT images were loaded into the planning software.  Then the target and avoidance structures were contoured.  Treatment planning then occurred.  The radiation prescription was entered and confirmed.  A total of 3 complex treatment devices were fabricated which relate to the designed radiation treatment fields. Each of these customized fields/ complex treatment devices will be used on a daily basis during the radiation course. I have requested : 3D Simulation  I have requested a DVH of the following structures: target volume, lungs, esophagus.   The patient will undergo daily image guidance to ensure accurate localization of the target, and adequate minimize dose to the normal surrounding structures in close proximity to the target.   PLAN:  The patient will receive 60 Gy in 30 fractions.  ________________________________   Jodelle Gross, MD, PhD

## 2016-11-24 ENCOUNTER — Ambulatory Visit
Admission: RE | Admit: 2016-11-24 | Discharge: 2016-11-24 | Disposition: A | Discharge status: Home / Self Care | Payer: Medicare Other | Source: Ambulatory Visit | Attending: Radiation Oncology | Admitting: Radiation Oncology

## 2016-11-24 DIAGNOSIS — J449 Chronic obstructive pulmonary disease, unspecified: Secondary | ICD-10-CM | POA: Diagnosis not present

## 2016-11-24 DIAGNOSIS — Z510 Encounter for antineoplastic radiation therapy: Secondary | ICD-10-CM | POA: Diagnosis not present

## 2016-11-24 DIAGNOSIS — R51 Headache: Secondary | ICD-10-CM | POA: Diagnosis not present

## 2016-11-24 DIAGNOSIS — K219 Gastro-esophageal reflux disease without esophagitis: Secondary | ICD-10-CM | POA: Diagnosis not present

## 2016-11-24 DIAGNOSIS — N400 Enlarged prostate without lower urinary tract symptoms: Secondary | ICD-10-CM | POA: Diagnosis not present

## 2016-11-24 DIAGNOSIS — C342 Malignant neoplasm of middle lobe, bronchus or lung: Secondary | ICD-10-CM | POA: Diagnosis not present

## 2016-11-25 ENCOUNTER — Ambulatory Visit
Admission: RE | Admit: 2016-11-25 | Discharge: 2016-11-25 | Disposition: A | Discharge status: Home / Self Care | Payer: Medicare Other | Source: Ambulatory Visit | Attending: Radiation Oncology | Admitting: Radiation Oncology

## 2016-11-25 DIAGNOSIS — C342 Malignant neoplasm of middle lobe, bronchus or lung: Secondary | ICD-10-CM | POA: Diagnosis not present

## 2016-11-25 DIAGNOSIS — Z510 Encounter for antineoplastic radiation therapy: Secondary | ICD-10-CM | POA: Diagnosis not present

## 2016-11-25 DIAGNOSIS — N400 Enlarged prostate without lower urinary tract symptoms: Secondary | ICD-10-CM | POA: Diagnosis not present

## 2016-11-25 DIAGNOSIS — J449 Chronic obstructive pulmonary disease, unspecified: Secondary | ICD-10-CM | POA: Diagnosis not present

## 2016-11-25 DIAGNOSIS — R51 Headache: Secondary | ICD-10-CM | POA: Diagnosis not present

## 2016-11-25 DIAGNOSIS — K219 Gastro-esophageal reflux disease without esophagitis: Secondary | ICD-10-CM | POA: Diagnosis not present

## 2016-11-26 ENCOUNTER — Ambulatory Visit
Admission: RE | Admit: 2016-11-26 | Discharge: 2016-11-26 | Disposition: A | Discharge status: Home / Self Care | Payer: Medicare Other | Source: Ambulatory Visit | Attending: Radiation Oncology | Admitting: Radiation Oncology

## 2016-11-26 DIAGNOSIS — K219 Gastro-esophageal reflux disease without esophagitis: Secondary | ICD-10-CM | POA: Diagnosis not present

## 2016-11-26 DIAGNOSIS — J449 Chronic obstructive pulmonary disease, unspecified: Secondary | ICD-10-CM | POA: Diagnosis not present

## 2016-11-26 DIAGNOSIS — R51 Headache: Secondary | ICD-10-CM | POA: Diagnosis not present

## 2016-11-26 DIAGNOSIS — Z510 Encounter for antineoplastic radiation therapy: Secondary | ICD-10-CM | POA: Diagnosis not present

## 2016-11-26 DIAGNOSIS — C342 Malignant neoplasm of middle lobe, bronchus or lung: Secondary | ICD-10-CM | POA: Diagnosis not present

## 2016-11-26 DIAGNOSIS — N400 Enlarged prostate without lower urinary tract symptoms: Secondary | ICD-10-CM | POA: Diagnosis not present

## 2016-11-29 ENCOUNTER — Ambulatory Visit
Admission: RE | Admit: 2016-11-29 | Discharge: 2016-11-29 | Disposition: A | Discharge status: Home / Self Care | Payer: Medicare Other | Source: Ambulatory Visit | Attending: Radiation Oncology | Admitting: Radiation Oncology

## 2016-11-29 ENCOUNTER — Other Ambulatory Visit: Payer: Self-pay | Admitting: Thoracic Surgery (Cardiothoracic Vascular Surgery)

## 2016-11-29 DIAGNOSIS — C342 Malignant neoplasm of middle lobe, bronchus or lung: Secondary | ICD-10-CM | POA: Diagnosis not present

## 2016-11-29 DIAGNOSIS — N400 Enlarged prostate without lower urinary tract symptoms: Secondary | ICD-10-CM | POA: Diagnosis not present

## 2016-11-29 DIAGNOSIS — J449 Chronic obstructive pulmonary disease, unspecified: Secondary | ICD-10-CM | POA: Diagnosis not present

## 2016-11-29 DIAGNOSIS — Z510 Encounter for antineoplastic radiation therapy: Secondary | ICD-10-CM | POA: Diagnosis not present

## 2016-11-29 DIAGNOSIS — K219 Gastro-esophageal reflux disease without esophagitis: Secondary | ICD-10-CM | POA: Diagnosis not present

## 2016-11-29 DIAGNOSIS — R51 Headache: Secondary | ICD-10-CM | POA: Diagnosis not present

## 2016-11-29 DIAGNOSIS — C3490 Malignant neoplasm of unspecified part of unspecified bronchus or lung: Secondary | ICD-10-CM

## 2016-11-30 ENCOUNTER — Encounter: Payer: Self-pay | Admitting: Thoracic Surgery (Cardiothoracic Vascular Surgery)

## 2016-11-30 ENCOUNTER — Ambulatory Visit
Admission: RE | Admit: 2016-11-30 | Discharge: 2016-11-30 | Disposition: A | Discharge status: Home / Self Care | Payer: Medicare Other | Source: Ambulatory Visit | Attending: Thoracic Surgery (Cardiothoracic Vascular Surgery) | Admitting: Thoracic Surgery (Cardiothoracic Vascular Surgery)

## 2016-11-30 ENCOUNTER — Other Ambulatory Visit: Payer: Self-pay | Admitting: *Deleted

## 2016-11-30 ENCOUNTER — Ambulatory Visit
Admission: RE | Admit: 2016-11-30 | Discharge: 2016-11-30 | Disposition: A | Discharge status: Home / Self Care | Payer: Medicare Other | Source: Ambulatory Visit | Attending: Radiation Oncology | Admitting: Radiation Oncology

## 2016-11-30 ENCOUNTER — Other Ambulatory Visit: Payer: Self-pay | Admitting: Thoracic Surgery (Cardiothoracic Vascular Surgery)

## 2016-11-30 ENCOUNTER — Ambulatory Visit (INDEPENDENT_AMBULATORY_CARE_PROVIDER_SITE_OTHER): Payer: Self-pay | Admitting: Thoracic Surgery (Cardiothoracic Vascular Surgery)

## 2016-11-30 VITALS — BP 99/65 | HR 88 | Ht 69.0 in | Wt 151.0 lb

## 2016-11-30 DIAGNOSIS — H43811 Vitreous degeneration, right eye: Secondary | ICD-10-CM | POA: Diagnosis not present

## 2016-11-30 DIAGNOSIS — K219 Gastro-esophageal reflux disease without esophagitis: Secondary | ICD-10-CM | POA: Diagnosis not present

## 2016-11-30 DIAGNOSIS — H25811 Combined forms of age-related cataract, right eye: Secondary | ICD-10-CM | POA: Diagnosis not present

## 2016-11-30 DIAGNOSIS — R51 Headache: Secondary | ICD-10-CM | POA: Diagnosis not present

## 2016-11-30 DIAGNOSIS — Z902 Acquired absence of lung [part of]: Secondary | ICD-10-CM | POA: Diagnosis not present

## 2016-11-30 DIAGNOSIS — J90 Pleural effusion, not elsewhere classified: Secondary | ICD-10-CM

## 2016-11-30 DIAGNOSIS — Z9889 Other specified postprocedural states: Secondary | ICD-10-CM

## 2016-11-30 DIAGNOSIS — J449 Chronic obstructive pulmonary disease, unspecified: Secondary | ICD-10-CM | POA: Diagnosis not present

## 2016-11-30 DIAGNOSIS — R918 Other nonspecific abnormal finding of lung field: Secondary | ICD-10-CM

## 2016-11-30 DIAGNOSIS — C342 Malignant neoplasm of middle lobe, bronchus or lung: Secondary | ICD-10-CM | POA: Diagnosis not present

## 2016-11-30 DIAGNOSIS — C3490 Malignant neoplasm of unspecified part of unspecified bronchus or lung: Secondary | ICD-10-CM | POA: Diagnosis not present

## 2016-11-30 DIAGNOSIS — Z961 Presence of intraocular lens: Secondary | ICD-10-CM | POA: Diagnosis not present

## 2016-11-30 DIAGNOSIS — N400 Enlarged prostate without lower urinary tract symptoms: Secondary | ICD-10-CM | POA: Diagnosis not present

## 2016-11-30 DIAGNOSIS — Z510 Encounter for antineoplastic radiation therapy: Secondary | ICD-10-CM | POA: Diagnosis not present

## 2016-11-30 NOTE — Progress Notes (Signed)
[name]       [place] [address]             [phone]       HPI: [name] returns for a scheduled follow up visit   He is an 83 year old gentleman who is being evaluated for a total knee replacement. On his preoperative chest x-ray was found to have a possible lung nodule. CT confirmed the nodule and it was hypermetabolic on PET/CT. Biopsy showed adenocarcinoma. On PET he had bilateral hilar areas of hypermetabolism. He was did not show any evidence of tumor in those areas.  I did a thoracoscopic right middle lobectomy on 10/07/2016. Tumor extended centrally near the main pulmonary artery. It had to be peeled off the main artery in that area. Vascular margin was positive but additional tissue was taken from that area and there was no tumor in that tissue. He had some mild confusion postoperatively and also had atrial fibrillation which converted to sinus rhythm with amiodarone. He was discharged on 10/12/2016.  I saw him on 8/28. He complained of some issues with his memory at that time but felt like he was starting to make some progress. Since then he has started radiation. He complains of some cough but no orthopnea. He has a little incisional pain, but does not need pain medication. He was in the ED with abdominal pain a couple of weeks ago. He was told it was an ulcer.  Past Medical History:  Diagnosis Date  . ALLERGIC RHINITIS 01/23/2007  . Arthritis    hands  . BENIGN PROSTATIC HYPERTROPHY 09/10/2006  . Chronic headaches   . Complication of anesthesia    difficulty voiding- if cath needed needs to placed with need to be done with scope  . COPD, MILD 04/03/2010   patient denies on preop visit of 08/02/2014   . Difficulty in urination   . FATIGUE 01/08/2008  . GERD 04/15/2010  . H. pylori infection    hx of   . Headache(784.0) 09/10/2006  . HOARSENESS 04/15/2010  . HYPERLIPIDEMIA 09/10/2006  . INSOMNIA-SLEEP DISORDER-UNSPEC 04/24/2009  . PEPTIC ULCER  DISEASE 01/23/2007  . Pneumonia    1955    Current Outpatient Prescriptions  Medication Sig Dispense Refill  . acetaminophen (TYLENOL) 500 MG tablet Take 2 tablets (1,000 mg total) by mouth every 6 (six) hours as needed. 30 tablet 0  . aspirin-acetaminophen-caffeine (EXCEDRIN MIGRAINE) 250-250-65 MG tablet Take 2 tablets by mouth daily as needed for headache or migraine.    . calcium carbonate (TUMS - DOSED IN MG ELEMENTAL CALCIUM) 500 MG chewable tablet Chew 1 tablet by mouth 3 (three) times daily as needed (for acid reflux.).    [name] eszopiclone (LUNESTA) 1 MG TABS tablet Take 1 mg by mouth at bedtime as needed for sleep. Take immediately before bedtime    . finasteride (PROSCAR) 5 MG tablet Take 5 mg by mouth at bedtime.     . gabapentin (NEURONTIN) 600 MG tablet Take 600 mg by mouth 2 (two) times daily as needed (for pain (typically once daily in the morning)).   0  . guaiFENesin-dextromethorphan (ROBITUSSIN DM) 100-10 MG/5ML syrup Take 5 mLs by mouth every 4 (four) hours as needed for cough. 118 mL 0  . silodosin (RAPAFLO) 8 MG CAPS capsule Take 8 mg by mouth at bedtime.     . sucralfate (CARAFATE) 1 g tablet Take 1 tablet (1 g total) by mouth 4 (four) times daily -  with meals and at bedtime. 90 tablet 0  . Wound Dressings (SONAFINE) Apply 1 application topically daily. Apply to chest area after rad tx daily    . zolpidem (AMBIEN CR) 12.5 MG CR tablet Take 12.5 mg by mouth at bedtime as needed for sleep.     No current facility-administered medications for this visit.     Physical Exam BP 99/65   Pulse 88   Ht 5\' 9"  (1.753 m)   Wt 151 lb (68.5 kg)   SpO2 98%   BMI 22.30 kg/m  83 yo man in NAD Alert and oriented Diminished BS and dullness to percussion right base Cardiac RRR Incisions clean and dry No peripheral edema  Diagnostic Tests: CHEST  2 VIEW  COMPARISON:  10/26/2016  FINDINGS: Interval progression of right pleural effusion now moderate in size. Right hilar  fullness with architectural distortion in the right upper lung is similar to prior. Left lung remains clear with tiny left pleural effusion. The cardiopericardial silhouette is within normal limits for size. The visualized bony structures of the thorax are intact.  IMPRESSION: Interval progression of right pleural effusion now moderate in size.   Electronically Signed   By: Misty Stanley M.D.   On: 11/30/2016 09:51   Impression: 83 yo man who had a right middle lobectomy on 10/07/2016. Now undergoing XRT. He has a right pleural effusion on CXR today. It was present on his CT of the abdomen 2 weeks ago, but is new from 8/28. He needs a thoracentesis.  I discussed the indications, risks, benefits, and alternatives. He accepts the risks and agrees to proceed.  Plan: Thoracentesis.  PROCEDURE  Using sterile technique and 5 ml 1% lidocaine local anesthetic right thoracentesis performed. 1.8 L of serous fluid evacuated. Patient tolerated well.  Addendum  On post thoracentesis film there is a pneumothorax ex vacuo.   Revonda Standard Roxan Hockey, MD [facility] [phone]   [name], MD [facility] [phone]

## 2016-12-01 ENCOUNTER — Other Ambulatory Visit: Payer: Self-pay | Admitting: *Deleted

## 2016-12-01 ENCOUNTER — Encounter: Payer: Self-pay | Admitting: Physician Assistant

## 2016-12-01 ENCOUNTER — Encounter: Payer: Self-pay | Admitting: Internal Medicine

## 2016-12-01 ENCOUNTER — Ambulatory Visit (INDEPENDENT_AMBULATORY_CARE_PROVIDER_SITE_OTHER): Payer: Medicare Other | Admitting: Internal Medicine

## 2016-12-01 ENCOUNTER — Ambulatory Visit
Admission: RE | Admit: 2016-12-01 | Discharge: 2016-12-01 | Disposition: A | Discharge status: Home / Self Care | Payer: Medicare Other | Source: Ambulatory Visit | Attending: Radiation Oncology | Admitting: Radiation Oncology

## 2016-12-01 ENCOUNTER — Other Ambulatory Visit: Payer: Medicare Other

## 2016-12-01 ENCOUNTER — Ambulatory Visit (INDEPENDENT_AMBULATORY_CARE_PROVIDER_SITE_OTHER): Payer: Medicare Other | Admitting: Physician Assistant

## 2016-12-01 VITALS — BP 128/74 | HR 85 | Temp 98.5°F | Ht 69.0 in | Wt 146.0 lb

## 2016-12-01 VITALS — BP 106/58 | HR 70 | Ht 69.0 in | Wt 145.0 lb

## 2016-12-01 DIAGNOSIS — C342 Malignant neoplasm of middle lobe, bronchus or lung: Secondary | ICD-10-CM | POA: Diagnosis not present

## 2016-12-01 DIAGNOSIS — R1013 Epigastric pain: Secondary | ICD-10-CM | POA: Diagnosis not present

## 2016-12-01 DIAGNOSIS — R059 Cough, unspecified: Secondary | ICD-10-CM

## 2016-12-01 DIAGNOSIS — R05 Cough: Secondary | ICD-10-CM | POA: Diagnosis not present

## 2016-12-01 DIAGNOSIS — R058 Other specified cough: Secondary | ICD-10-CM | POA: Insufficient documentation

## 2016-12-01 DIAGNOSIS — J449 Chronic obstructive pulmonary disease, unspecified: Secondary | ICD-10-CM | POA: Diagnosis not present

## 2016-12-01 DIAGNOSIS — R12 Heartburn: Secondary | ICD-10-CM

## 2016-12-01 DIAGNOSIS — G8929 Other chronic pain: Secondary | ICD-10-CM | POA: Diagnosis not present

## 2016-12-01 DIAGNOSIS — R51 Headache: Secondary | ICD-10-CM | POA: Diagnosis not present

## 2016-12-01 DIAGNOSIS — N400 Enlarged prostate without lower urinary tract symptoms: Secondary | ICD-10-CM | POA: Diagnosis not present

## 2016-12-01 DIAGNOSIS — Z510 Encounter for antineoplastic radiation therapy: Secondary | ICD-10-CM | POA: Diagnosis not present

## 2016-12-01 DIAGNOSIS — R739 Hyperglycemia, unspecified: Secondary | ICD-10-CM | POA: Diagnosis not present

## 2016-12-01 DIAGNOSIS — J939 Pneumothorax, unspecified: Secondary | ICD-10-CM

## 2016-12-01 DIAGNOSIS — K219 Gastro-esophageal reflux disease without esophagitis: Secondary | ICD-10-CM | POA: Diagnosis not present

## 2016-12-01 LAB — CELL COUNT AND DIFF, FLUID, OTHER
BASOPHILS, %: 0 %
Eosinophils, %: 1 %
Lymphocytes, %: 80 %
MESOTHELIAL, %: 0 %
Monocyte/Macrophage %: 18 %
NEUTROPHILS, %: 1 %
TOTAL NUCLEATED CELL CT: 1076 {cells}/uL

## 2016-12-01 LAB — GLUCOSE, RANDOM

## 2016-12-01 MED ORDER — TRAMADOL HCL 50 MG PO TABS: 50.0000 mg | ORAL_TABLET | Freq: Three times a day (TID) | ORAL | 2 refills | Status: DC | PRN

## 2016-12-01 MED ORDER — PANTOPRAZOLE SODIUM 40 MG PO TBEC: 40.0000 mg | DELAYED_RELEASE_TABLET | Freq: Every day | ORAL | 3 refills | Status: DC

## 2016-12-01 MED ORDER — ZOLPIDEM TARTRATE 5 MG PO TABS: 5.0000 mg | ORAL_TABLET | Freq: Every evening | ORAL | 5 refills | Status: DC | PRN

## 2016-12-01 MED ORDER — BENZONATATE 100 MG PO CAPS: ORAL_CAPSULE | ORAL | 2 refills | Status: DC

## 2016-12-01 NOTE — Progress Notes (Signed)
Chief Complaint: Abdominal pain  HPI:  Raymond Logan is an 83 year old Caucasian male with a past medical history as below currently being treated for lung cancer with radiation,  who was referred to me by Biagio Borg, MD for a complaint of abdominal pain .      Patient was previously seen by Dr. Deatra Ina and accepted by Dr. Ardis Hughs recently.    Patient was seen in the ER on 11/17/16 with a complaint of abdominal pain for 2 days. The pain spread across his upper abdomen. He had tried taking Tums and Rolaids with no relief at home. Labs at that time showed a CMP with a creatinine of 1.27 and otherwise normal. CBC was significant for hemoglobin minimally decreased 11.9 and otherwise normal. Lipase and urinalysis were normal. Patient had CT of the abdomen with no acute findings but did show a right pleural effusion.    Today, the patient presents to clinic accompanied by his wife and explains that many years ago he was diagnosed with H. pylori. He tells me multiple times that he feels as though these are the same symptoms and he would like treatment for H. pylori again. He describes epigastric burning pain which is worse when he was seen in the ER at the beginning of last month and has since calmed down a bit. He tells me that he only feels it periodically now throughout the day and now describes it as a burning sensation. This is worse with eating. Patient has also had mostly loose stools. They do remind me the patient is going through radiation currently for lung cancer. Patient is on Carafate 4 times a day. This does help relieve some of his discomfort.    Patient denies fever, chills, blood in the stool, melena, anorexia, nausea, vomiting or dysphagia.  Past Medical History:  Diagnosis Date  . ALLERGIC RHINITIS 01/23/2007  . Arthritis    hands  . BENIGN PROSTATIC HYPERTROPHY 09/10/2006  . Chronic headaches   . Complication of anesthesia    difficulty voiding- if cath needed needs to placed with  need to be done with scope  . COPD, MILD 04/03/2010   patient denies on preop visit of 08/02/2014   . Difficulty in urination   . FATIGUE 01/08/2008  . GERD 04/15/2010  . H. pylori infection    hx of   . Headache(784.0) 09/10/2006  . HOARSENESS 04/15/2010  . HYPERLIPIDEMIA 09/10/2006  . INSOMNIA-SLEEP DISORDER-UNSPEC 04/24/2009  . PEPTIC ULCER DISEASE 01/23/2007  . Pneumonia    1955    Past Surgical History:  Procedure Laterality Date  . CATARACT EXTRACTION Left   . COLONOSCOPY    . CYSTOSCOPY N/A 10/07/2016   Procedure: CYSTOSCOPY;  Surgeon: Festus Aloe, MD;  Location: [place];  Service: Urology;  Laterality: N/A;  . HEMORRHOID BANDING    . INSERTION OF SUPRAPUBIC CATHETER N/A 10/07/2016   Procedure: FOLEY  CATHETER PLACEMENT;  Surgeon: Festus Aloe, MD;  Location: [place];  Service: Urology;  Laterality: N/A;  . KNEE ARTHROSCOPY Left    torn meniscus  . LUMBAR LAMINECTOMY/DECOMPRESSION MICRODISCECTOMY Right 08/07/2014   Procedure: complete DECOMPRESSION LUMBAR LAMINECTOMY/MICRODISCECTOMY OF L4-L5 for spinal stenosis, DECOMPRESSION OF L3-L4;  Surgeon: Latanya Maudlin, MD;  Location: [facility];  Service: Orthopedics;  Laterality: Right;  . ROTATOR CUFF REPAIR Right   . SHOULDER ARTHROSCOPY     x3, L & R  . VIDEO ASSISTED THORACOSCOPY (VATS)/ LOBECTOMY Right 10/07/2016   Procedure: VIDEO ASSISTED THORACOSCOPY (VATS)/RIGHT MIDDLE LOBECTOMY;  Surgeon: [name], MD;  Location: [place];  Service: Thoracic;  Laterality: Right;  [name] VIDEO BRONCHOSCOPY WITH ENDOBRONCHIAL NAVIGATION N/A 09/23/2016   Procedure: VIDEO BRONCHOSCOPY WITH ENDOBRONCHIAL NAVIGATION;  Surgeon: [name], MD;  Location: [place];  Service: Thoracic;  Laterality: N/A;  . VIDEO BRONCHOSCOPY WITH ENDOBRONCHIAL ULTRASOUND N/A 09/23/2016   Procedure: VIDEO BRONCHOSCOPY WITH ENDOBRONCHIAL ULTRASOUND;  Surgeon: [name], MD;  Location: [facility];  Service: Thoracic;  Laterality: N/A;    Current Outpatient  Prescriptions  Medication Sig Dispense Refill  . acetaminophen (TYLENOL) 500 MG tablet Take 2 tablets (1,000 mg total) by mouth every 6 (six) hours as needed. 30 tablet 0  . aspirin-acetaminophen-caffeine (EXCEDRIN MIGRAINE) 250-250-65 MG tablet Take 2 tablets by mouth daily as needed for headache or migraine.    . calcium carbonate (TUMS - DOSED IN MG ELEMENTAL CALCIUM) 500 MG chewable tablet Chew 1 tablet by mouth 3 (three) times daily as needed (for acid reflux.).    [name] eszopiclone (LUNESTA) 1 MG TABS tablet Take 1 mg by mouth at bedtime as needed for sleep. Take immediately before bedtime    . finasteride (PROSCAR) 5 MG tablet Take 5 mg by mouth at bedtime.     . gabapentin (NEURONTIN) 600 MG tablet Take 600 mg by mouth 2 (two) times daily as needed (for pain (typically once daily in the morning)).   0  . guaiFENesin-dextromethorphan (ROBITUSSIN DM) 100-10 MG/5ML syrup Take 5 mLs by mouth every 4 (four) hours as needed for cough. 118 mL 0  . silodosin (RAPAFLO) 8 MG CAPS capsule Take 8 mg by mouth at bedtime.     . sucralfate (CARAFATE) 1 g tablet Take 1 tablet (1 g total) by mouth 4 (four) times daily -  with meals and at bedtime. 90 tablet 0  . Wound Dressings (SONAFINE) Apply 1 application topically daily. Apply to chest area after rad tx daily    . zolpidem (AMBIEN CR) 12.5 MG CR tablet Take 12.5 mg by mouth at bedtime as needed for sleep.    . pantoprazole (PROTONIX) 40 MG tablet Take 1 tablet (40 mg total) by mouth daily. 30 tablet 3   No current facility-administered medications for this visit.     Allergies as of 12/01/2016 - Review Complete 12/01/2016  Allergen Reaction Noted  . Alfuzosin Other (See Comments) 08/01/2014  . Aricept [donepezil hcl] Other (See Comments) 07/24/2014  . Penicillins Hives 09/10/2006    Family History  Problem Relation Age of Onset  . Prostate cancer Brother   . Alzheimer's disease Mother   . Alzheimer's disease Sister     Social History    Social History  . Marital status: Married    Spouse name: N/A  . Number of children: 3  . Years of education: N/A   Occupational History  . Retired Retired   Social History Main Topics  . Smoking status: Former Smoker    Packs/day: 1.00    Years: 23.00    Quit date: 91  . Smokeless tobacco: Never Used  . Alcohol use No     Comment: rare  . Drug use: No  . Sexual activity: Not on file   Other Topics Concern  . Not on file   Social History Narrative  . No narrative on file    Review of Systems:    Constitutional: No fever or chills Skin: No rash  Cardiovascular: No chest pain  Respiratory: No SOB Gastrointestinal: See HPI and otherwise negative Genitourinary:  No dysuria  Neurological: No headache Musculoskeletal: No new muscle or joint pain Hematologic: No bleeding  Psychiatric: No history of depression or anxiety   Physical Exam:  Vital signs: BP (!) 106/58   Pulse 70   Ht 5\' 9"  (1.753 m)   Wt 145 lb (65.8 kg)   BMI 21.41 kg/m   Constitutional:   Pleasant Caucasian male appears to be in NAD, Well developed, Well nourished, alert and cooperative Head:  Normocephalic and atraumatic. Eyes:   PEERL, EOMI. No icterus. Conjunctiva pink. Ears:  Normal auditory acuity. Neck:  Supple Throat: Oral cavity and pharynx without inflammation, swelling or lesion.  Respiratory: Respirations even and unlabored. Lungs clear to auscultation bilaterally.   No wheezes, crackles, or rhonchi.  Cardiovascular: Normal S1, S2. No MRG. Regular rate and rhythm. No peripheral edema, cyanosis or pallor.  Gastrointestinal:  Soft, nondistended, mild epigastric ttp, No rebound or guarding. Normal bowel sounds. No appreciable masses or hepatomegaly. Rectal:  Not performed.  Msk:  Symmetrical without gross deformities. Without edema, no deformity or joint abnormality.  Neurologic:  Alert and  oriented x4;  grossly normal neurologically.  Skin:   Dry and intact without significant lesions  or rashes. Psychiatric: Demonstrates good judgement and reason without abnormal affect or behaviors.  RELEVANT LABS AND IMAGING: CBC    Component Value Date/Time   WBC 7.6 11/16/2016 1950   RBC 3.97 (L) 11/16/2016 1950   HGB 11.9 (L) 11/16/2016 1950   HGB 11.7 (L) 11/04/2016 1334   HCT 34.9 (L) 11/16/2016 1950   HCT 34.5 (L) 11/04/2016 1334   PLT 462 (H) 11/16/2016 1950   PLT 248 11/04/2016 1334   MCV 87.9 11/16/2016 1950   MCV 89.8 11/04/2016 1334   MCH 30.0 11/16/2016 1950   MCHC 34.1 11/16/2016 1950   RDW 12.3 11/16/2016 1950   RDW 12.5 11/04/2016 1334   LYMPHSABS 1.2 11/04/2016 1334   MONOABS 0.6 11/04/2016 1334   EOSABS 0.2 11/04/2016 1334   BASOSABS 0.0 11/04/2016 1334    CMP     Component Value Date/Time   NA 138 11/16/2016 1950   NA 142 11/04/2016 1334   K 3.9 11/16/2016 1950   K 4.0 11/04/2016 1334   CL 103 11/16/2016 1950   CO2 26 11/16/2016 1950   CO2 26 11/04/2016 1334   GLUCOSE 118 (H) 11/16/2016 1950   GLUCOSE 110 11/04/2016 1334   BUN 17 11/16/2016 1950   BUN 15.4 11/04/2016 1334   CREATININE 1.27 (H) 11/16/2016 1950   CREATININE 1.2 11/04/2016 1334   CALCIUM 8.9 11/16/2016 1950   CALCIUM 9.3 11/04/2016 1334   PROT 6.8 11/16/2016 1950   PROT 6.8 11/04/2016 1334   ALBUMIN 3.1 (L) 11/16/2016 1950   ALBUMIN 2.9 (L) 11/04/2016 1334   AST 18 11/16/2016 1950   AST 14 11/04/2016 1334   ALT 13 (L) 11/16/2016 1950   ALT 10 11/04/2016 1334   ALKPHOS 90 11/16/2016 1950   ALKPHOS 91 11/04/2016 1334   BILITOT 0.5 11/16/2016 1950   BILITOT 0.37 11/04/2016 1334   GFRNONAA 50 (L) 11/16/2016 1950   GFRAA 58 (L) 11/16/2016 1950   EXAM: CT ABDOMEN AND PELVIS WITH CONTRAST 11/17/16  TECHNIQUE: Multidetector CT imaging of the abdomen and pelvis was performed using the standard protocol following bolus administration of intravenous contrast.  CONTRAST:  151mL ISOVUE-300 IOPAMIDOL (ISOVUE-300) INJECTION 61%  COMPARISON:  PET-CT  08/25/2016  FINDINGS: Lower chest: Large right pleural effusion with atelectasis or consolidation in the right lower lung.  Postoperative changes in the right lung with partial resection. Left lung base is clear.  Hepatobiliary: No focal liver abnormality is seen. No gallstones, gallbladder wall thickening, or biliary dilatation.  Pancreas: Unremarkable. No pancreatic ductal dilatation or surrounding inflammatory changes.  Spleen: Normal in size without focal abnormality.  Adrenals/Urinary Tract: No adrenal gland nodules. Scarring and atrophy of the left kidney. No hydronephrosis. Cyst in the right kidney. No hydronephrosis. Bladder wall is not thickened and no bladder filling defects are demonstrated.  Stomach/Bowel: Stomach is within normal limits. Appendix appears normal. No evidence of bowel wall thickening, distention, or inflammatory changes.  Vascular/Lymphatic: Aortic atherosclerosis. No enlarged abdominal or pelvic lymph nodes.  Reproductive: Marked enlargement of the prostate gland, measuring 7.6 cm diameter. Prostate calcifications.  Other: No free air or free fluid in the abdomen. Abdominal wall musculature appears intact.  Musculoskeletal: Degenerative changes in the spine. No destructive bone lesions.  IMPRESSION: 1. Large right pleural effusion with atelectasis or consolidation in the right lung base. 2. No acute process demonstrated in the abdomen or pelvis. No bowel obstruction or inflammation. 3. Prominent prostate enlargement.   Electronically Signed   By: Lucienne Capers M.D.   On: 11/17/2016 05:20     Assessment: 1. Heartburn: Worse over the past month, consider relation to radiation 2. Epigastric pain: With above 3. History of H. pylori  Plan: 1. Discussed the patient that we can test him for H. pylori. Ordered a fecal antigen. Recommend that he complete his stool study before starting Pantoprazole as prescribed below 2.  Prescribed Pantoprazole 40 mg once daily, 30-60 minutes before eating breakfast. 3. Patient can continue Carafate 4 times a day as needed 4. Patient to follow in clinic in 4-6 weeks or sooner if necessary with Dr. Ardis Hughs or myself  Ellouise Newer, PA-C [facility] 12/01/2016, 11:51 AM  Cc: Biagio Borg, MD

## 2016-12-01 NOTE — Assessment & Plan Note (Signed)
No evidence for infeciton, likely related to underlying dz, for tessalon perlr symptomatic relief

## 2016-12-01 NOTE — Assessment & Plan Note (Signed)
[place] for tramadol prn,  to f/u any worsening symptoms or concerns

## 2016-12-01 NOTE — Assessment & Plan Note (Signed)
Lab Results  Component Value Date   HGBA1C 5.0 03/11/2016  stable overall by history and exam, recent data reviewed with pt, and pt to continue medical treatment as before,  to f/u any worsening symptoms or concerns

## 2016-12-01 NOTE — Patient Instructions (Signed)
Please take all new medication as prescribed - the pills for coughing  Please continue all other medications as before, and refills have been done if requested of the ambien and tramadol.  Please have the pharmacy call with any other refills you may need.  Please keep your appointments with your specialists as you may have planned

## 2016-12-01 NOTE — Progress Notes (Signed)
Subjective:    Patient ID: Raymond Logan, male    DOB: 03/01/1931, 83 y.o.   MRN: [id number]  HPI  Here to f/u; overall doing ok,  Pt denies chest pain, wheezing, orthopnea, PND, increased LE swelling, palpitations, dizziness or syncope, though has some chornic non prod cough and mild sob, better since yesterday. .  Pt denies new neurological symptoms such as new headache, or facial or extremity weakness or numbness.  Pt denies polydipsia, polyuria, or low sugar episode.  Pt states overall good compliance with meds, mostly trying to follow appropriate diet, with wt loss recently, with 4 lbs off since yesterday procedure.  Wt Readings from Last 3 Encounters:  12/01/16 146 lb (66.2 kg)  12/01/16 145 lb (65.8 kg)  11/30/16 151 lb (68.5 kg)  S/p 2L right thoracentesis yesterday, with plan to f/u surgeon tomorrow, Getting XRT daily, has f/u with surgeon tomorrow. C/o worsening memory issues recently, maybe some worse with stress.  "I cant remember 10 min ago" but can relate adquate hx today. Admits to missing some of his medications.    Recalls he has a stool test with GI to f/u H pylori.  Pt denies fever, night sweats. Does have ongoing pain, asks for tramadol refill.  Also asks for sleep med back to [facility], though was denied previously by insuracne but covered med - halcion not helping Past Medical History:  Diagnosis Date  . ALLERGIC RHINITIS 01/23/2007  . Arthritis    hands  . BENIGN PROSTATIC HYPERTROPHY 09/10/2006  . Chronic headaches   . Complication of anesthesia    difficulty voiding- if cath needed needs to placed with need to be done with scope  . COPD, MILD 04/03/2010   patient denies on preop visit of 08/02/2014   . Difficulty in urination   . FATIGUE 01/08/2008  . GERD 04/15/2010  . H. pylori infection    hx of   . Headache(784.0) 09/10/2006  . HOARSENESS 04/15/2010  . HYPERLIPIDEMIA 09/10/2006  . INSOMNIA-SLEEP DISORDER-UNSPEC 04/24/2009  . PEPTIC ULCER DISEASE 01/23/2007  .  Pneumonia    1955   Past Surgical History:  Procedure Laterality Date  . CATARACT EXTRACTION Left   . COLONOSCOPY    . CYSTOSCOPY N/A 10/07/2016   Procedure: CYSTOSCOPY;  Surgeon: Festus Aloe, MD;  Location: [place];  Service: Urology;  Laterality: N/A;  . HEMORRHOID BANDING    . INSERTION OF SUPRAPUBIC CATHETER N/A 10/07/2016   Procedure: FOLEY  CATHETER PLACEMENT;  Surgeon: Festus Aloe, MD;  Location: [place];  Service: Urology;  Laterality: N/A;  . KNEE ARTHROSCOPY Left    torn meniscus  . LUMBAR LAMINECTOMY/DECOMPRESSION MICRODISCECTOMY Right 08/07/2014   Procedure: complete DECOMPRESSION LUMBAR LAMINECTOMY/MICRODISCECTOMY OF L4-L5 for spinal stenosis, DECOMPRESSION OF L3-L4;  Surgeon: Latanya Maudlin, MD;  Location: [facility];  Service: Orthopedics;  Laterality: Right;  . ROTATOR CUFF REPAIR Right   . SHOULDER ARTHROSCOPY     x3, L & R  . VIDEO ASSISTED THORACOSCOPY (VATS)/ LOBECTOMY Right 10/07/2016   Procedure: VIDEO ASSISTED THORACOSCOPY (VATS)/RIGHT MIDDLE LOBECTOMY;  Surgeon: [name], MD;  Location: [place];  Service: Thoracic;  Laterality: Right;  [name] VIDEO BRONCHOSCOPY WITH ENDOBRONCHIAL NAVIGATION N/A 09/23/2016   Procedure: VIDEO BRONCHOSCOPY WITH ENDOBRONCHIAL NAVIGATION;  Surgeon: [name], MD;  Location: [place];  Service: Thoracic;  Laterality: N/A;  . VIDEO BRONCHOSCOPY WITH ENDOBRONCHIAL ULTRASOUND N/A 09/23/2016   Procedure: VIDEO BRONCHOSCOPY WITH ENDOBRONCHIAL ULTRASOUND;  Surgeon: [name], MD;  Location: [place];  Service: Thoracic;  Laterality: N/A;    reports that he quit smoking about 40 years ago. He has a 23.00 pack-year smoking history. He has never used smokeless tobacco. He reports that he does not drink alcohol or use drugs. family history includes Alzheimer's disease in his mother and sister; Prostate cancer in his brother. Allergies  Allergen Reactions  . Alfuzosin Other (See Comments)    BP went crazy, dizzy, passing out     . Aricept [Donepezil Hcl] Other (See Comments)    Insomnia, headache  . Penicillins Hives    PATIENT HAS HAD A PCN REACTION WITH IMMEDIATE RASH, FACIAL/TONGUE/THROAT SWELLING, SOB, OR LIGHTHEADEDNESS WITH HYPOTENSION:  #  #  #  YES  #  #  #   Has patient had a PCN reaction causing severe rash involving mucus membranes or skin necrosis:No Has patient had a PCN reaction that required hospitalization:No Has patient had a PCN reaction occurring within the last 10 years:No If all of the above answers are "NO", then may proceed with Cephalosporin use.    Current Outpatient Prescriptions on File Prior to Visit  Medication Sig Dispense Refill  . acetaminophen (TYLENOL) 500 MG tablet Take 2 tablets (1,000 mg total) by mouth every 6 (six) hours as needed. 30 tablet 0  . aspirin-acetaminophen-caffeine (EXCEDRIN MIGRAINE) 250-250-65 MG tablet Take 2 tablets by mouth daily as needed for headache or migraine.    . calcium carbonate (TUMS - DOSED IN MG ELEMENTAL CALCIUM) 500 MG chewable tablet Chew 1 tablet by mouth 3 (three) times daily as needed (for acid reflux.).    [name] eszopiclone (LUNESTA) 1 MG TABS tablet Take 1 mg by mouth at bedtime as needed for sleep. Take immediately before bedtime    . finasteride (PROSCAR) 5 MG tablet Take 5 mg by mouth at bedtime.     . gabapentin (NEURONTIN) 600 MG tablet Take 600 mg by mouth 2 (two) times daily as needed (for pain (typically once daily in the morning)).   0  . guaiFENesin-dextromethorphan (ROBITUSSIN DM) 100-10 MG/5ML syrup Take 5 mLs by mouth every 4 (four) hours as needed for cough. 118 mL 0  . pantoprazole (PROTONIX) 40 MG tablet Take 1 tablet (40 mg total) by mouth daily. 30 tablet 3  . silodosin (RAPAFLO) 8 MG CAPS capsule Take 8 mg by mouth at bedtime.     . sucralfate (CARAFATE) 1 g tablet Take 1 tablet (1 g total) by mouth 4 (four) times daily -  with meals and at bedtime. 90 tablet 0  . Wound Dressings (SONAFINE) Apply 1 application topically daily.  Apply to chest area after rad tx daily    . zolpidem (AMBIEN CR) 12.5 MG CR tablet Take 12.5 mg by mouth at bedtime as needed for sleep.     No current facility-administered medications on file prior to visit.    Review of Systems  Constitutional: Negative for other unusual diaphoresis or sweats HENT: Negative for ear discharge or swelling Eyes: Negative for other worsening visual disturbances Respiratory: Negative for stridor or other swelling  Gastrointestinal: Negative for worsening distension or other blood Genitourinary: Negative for retention or other urinary change Musculoskeletal: Negative for other MSK pain or swelling Skin: Negative for color change or other new lesions Neurological: Negative for worsening tremors and other numbness  Psychiatric/Behavioral: Negative for worsening agitation or other fatigue All other system neg per pt    Objective:   Physical Exam BP 128/74   Pulse 85   Temp 98.5 F (  36.9 C) (Oral)   Ht 5\' 9"  (1.753 m)   Wt 146 lb (66.2 kg)   SpO2 96%   BMI 21.56 kg/m  VS noted, thin with wt loss Constitutional: Pt appears in NAD HENT: Head: NCAT.  Right Ear: External ear normal.  Left Ear: External ear normal.  Eyes: . Pupils are equal, round, and reactive to light. Conjunctivae and EOM are normal Nose: without d/c or deformity Neck: Neck supple. Gross normal ROM Cardiovascular: Normal rate and regular rhythm.   Pulmonary/Chest: Effort normal and breath sounds decreased right base without rales or wheezing.  Neurological: Pt is alert. At baseline orientation, motor grossly intact Skin: Skin is warm. No rashes, other new lesions, no LE edema Psychiatric: Pt behavior is normal without agitation  No other exam findings Lab Results  Component Value Date   WBC 7.6 11/16/2016   HGB 11.9 (L) 11/16/2016   HCT 34.9 (L) 11/16/2016   PLT 462 (H) 11/16/2016   GLUCOSE CANCELED 11/30/2016   CHOL 179 03/11/2016   TRIG 188.0 (H) 03/11/2016   HDL 55.30  03/11/2016   LDLDIRECT 134.4 03/27/2010   LDLCALC 86 03/11/2016   ALT 13 (L) 11/16/2016   AST 18 11/16/2016   NA 138 11/16/2016   K 3.9 11/16/2016   CL 103 11/16/2016   CREATININE 1.27 (H) 11/16/2016   BUN 17 11/16/2016   CO2 26 11/16/2016   TSH 0.77 03/11/2016   PSA normal per urology 06/30/2007   INR 1.07 10/05/2016   HGBA1C 5.0 03/11/2016         Assessment & Plan:

## 2016-12-01 NOTE — Patient Instructions (Signed)
Your physician has requested that you go to the basement for  lab work before leaving today.   Do not start Pantoprazole until after you have completed your stool test.

## 2016-12-02 ENCOUNTER — Encounter: Payer: Self-pay | Admitting: Thoracic Surgery (Cardiothoracic Vascular Surgery)

## 2016-12-02 ENCOUNTER — Other Ambulatory Visit: Payer: Self-pay | Admitting: Thoracic Surgery (Cardiothoracic Vascular Surgery)

## 2016-12-02 ENCOUNTER — Ambulatory Visit (INDEPENDENT_AMBULATORY_CARE_PROVIDER_SITE_OTHER): Payer: Self-pay | Admitting: Thoracic Surgery (Cardiothoracic Vascular Surgery)

## 2016-12-02 ENCOUNTER — Ambulatory Visit
Admission: RE | Admit: 2016-12-02 | Discharge: 2016-12-02 | Disposition: A | Discharge status: Home / Self Care | Payer: Medicare Other | Source: Ambulatory Visit | Attending: Radiation Oncology | Admitting: Radiation Oncology

## 2016-12-02 ENCOUNTER — Telehealth: Payer: Self-pay | Admitting: Internal Medicine

## 2016-12-02 ENCOUNTER — Ambulatory Visit
Admission: RE | Admit: 2016-12-02 | Discharge: 2016-12-02 | Disposition: A | Discharge status: Home / Self Care | Payer: Medicare Other | Source: Ambulatory Visit | Attending: Thoracic Surgery (Cardiothoracic Vascular Surgery) | Admitting: Thoracic Surgery (Cardiothoracic Vascular Surgery)

## 2016-12-02 VITALS — BP 110/71 | HR 83 | Ht 69.0 in | Wt 148.0 lb

## 2016-12-02 DIAGNOSIS — N400 Enlarged prostate without lower urinary tract symptoms: Secondary | ICD-10-CM | POA: Diagnosis not present

## 2016-12-02 DIAGNOSIS — J939 Pneumothorax, unspecified: Secondary | ICD-10-CM

## 2016-12-02 DIAGNOSIS — Z9889 Other specified postprocedural states: Secondary | ICD-10-CM

## 2016-12-02 DIAGNOSIS — K219 Gastro-esophageal reflux disease without esophagitis: Secondary | ICD-10-CM | POA: Diagnosis not present

## 2016-12-02 DIAGNOSIS — Z510 Encounter for antineoplastic radiation therapy: Secondary | ICD-10-CM | POA: Diagnosis not present

## 2016-12-02 DIAGNOSIS — C342 Malignant neoplasm of middle lobe, bronchus or lung: Secondary | ICD-10-CM | POA: Diagnosis not present

## 2016-12-02 DIAGNOSIS — R51 Headache: Secondary | ICD-10-CM | POA: Diagnosis not present

## 2016-12-02 DIAGNOSIS — J948 Other specified pleural conditions: Secondary | ICD-10-CM | POA: Diagnosis not present

## 2016-12-02 DIAGNOSIS — J449 Chronic obstructive pulmonary disease, unspecified: Secondary | ICD-10-CM | POA: Diagnosis not present

## 2016-12-02 DIAGNOSIS — J90 Pleural effusion, not elsewhere classified: Secondary | ICD-10-CM

## 2016-12-02 LAB — NON-GYN, SPECIMEN A

## 2016-12-02 LAB — HELICOBACTER PYLORI  SPECIAL ANTIGEN
MICRO NUMBER:: 81099527
SPECIMEN QUALITY: ADEQUATE

## 2016-12-02 LAB — CYTOLOGY - NON PAP

## 2016-12-02 NOTE — Telephone Encounter (Signed)
Approved 11/02/2016 through 12/02/2017.

## 2016-12-02 NOTE — Progress Notes (Signed)
[address]       [place] [address]             [phone]     HPI: Raymond Logan returns for follow up of his right pleural effusion  Raymond Logan 83 year old man who had a thoracoscopic right middle lobectomy on 10/07/2016. The tumor extended centrally and had to be peeled off the main pulmonary artery. He had a close margin so is being treated with adjuvant radiation. He is about 3 weeks into his radiation course.  He came to the office 2 days ago. He was complaining of some cough but no orthopnea. He did have some general malaise as well. Recently been diagnosed with an ulcer.  His chest x-ray showed a moderate left pleural effusion. I did a thoracentesis in the office and drained 1.8 L of serous fluid. He tolerated the procedure well and on his post thoracentesis found there was a large pneumo ex vacuo. I asked him to come back to the office today for follow-up. He continues to have a cough. His breathing has not improved significantly since the thoracentesis.  Back at his films there was no effusion at all at the end of August, but there was a sizable effusion noted on the CT of the abdomen and pelvis that was done about 3 weeks ago.  Past Medical History:  Diagnosis Date  . ALLERGIC RHINITIS 01/23/2007  . Arthritis    hands  . BENIGN PROSTATIC HYPERTROPHY 09/10/2006  . Chronic headaches   . Complication of anesthesia    difficulty voiding- if cath needed needs to placed with need to be done with scope  . COPD, MILD 04/03/2010   patient denies on preop visit of 08/02/2014   . Difficulty in urination   . FATIGUE 01/08/2008  . GERD 04/15/2010  . H. pylori infection    hx of   . Headache(784.0) 09/10/2006  . HOARSENESS 04/15/2010  . HYPERLIPIDEMIA 09/10/2006  . INSOMNIA-SLEEP DISORDER-UNSPEC 04/24/2009  . PEPTIC ULCER DISEASE 01/23/2007  . Pneumonia    1953     Current Outpatient Prescriptions  Medication Sig Dispense Refill  . acetaminophen (TYLENOL)  500 MG tablet Take 2 tablets (1,000 mg total) by mouth every 6 (six) hours as needed. 30 tablet 0  . aspirin-acetaminophen-caffeine (EXCEDRIN MIGRAINE) 250-250-65 MG tablet Take 2 tablets by mouth daily as needed for headache or migraine.    . benzonatate (TESSALON PERLES) 100 MG capsule 1-2 tab by mouth three times per day as needed for cough 60 capsule 2  . calcium carbonate (TUMS - DOSED IN MG ELEMENTAL CALCIUM) 500 MG chewable tablet Chew 1 tablet by mouth 3 (three) times daily as needed (for acid reflux.).    [name] eszopiclone (LUNESTA) 1 MG TABS tablet Take 1 mg by mouth at bedtime as needed for sleep. Take immediately before bedtime    . finasteride (PROSCAR) 5 MG tablet Take 5 mg by mouth at bedtime.     . gabapentin (NEURONTIN) 600 MG tablet Take 600 mg by mouth 2 (two) times daily as needed (for pain (typically once daily in the morning)).   0  . guaiFENesin-dextromethorphan (ROBITUSSIN DM) 100-10 MG/5ML syrup Take 5 mLs by mouth every 4 (four) hours as needed for cough. 118 mL 0  . pantoprazole (PROTONIX) 40 MG tablet Take 1 tablet (40 mg total) by mouth daily. 30 tablet 3  . silodosin (RAPAFLO) 8 MG CAPS capsule Take 8 mg by mouth at bedtime.     [name]  sucralfate (CARAFATE) 1 g tablet Take 1 tablet (1 g total) by mouth 4 (four) times daily -  with meals and at bedtime. 90 tablet 0  . traMADol (ULTRAM) 50 MG tablet Take 1 tablet (50 mg total) by mouth every 8 (eight) hours as needed. 120 tablet 2  . Wound Dressings (SONAFINE) Apply 1 application topically daily. Apply to chest area after rad tx daily    . zolpidem (AMBIEN) 5 MG tablet Take 1 tablet (5 mg total) by mouth at bedtime as needed for sleep. 30 tablet 5   No current facility-administered medications for this visit.     Physical Exam BP 110/71   Pulse 83   Ht 5\' 9"  (1.753 m)   Wt 148 lb (67.1 kg)   SpO2 95%   BMI 21.59 kg/m  83 year old man in no acute distress Alert and oriented 3 with no focal deficits Diminished breath  sounds on on right Incisions well healed  Diagnostic Tests: CHEST  2 VIEW  COMPARISON:  11/30/2016  FINDINGS: The right hydropneumothorax is again identified. The fluid component has increased. The pneumothorax component has diminished. Overall aeration of the right lung is stable. Left lung is clear. Normal heart size.  IMPRESSION: Right hydropneumothorax fluid component has increased and pneumothorax component has diminished. Right lung aeration is stable.   Electronically Signed   By: Marybelle Killings M.D.   On: 12/02/2016 14:56  Impression: Pneumothorax its vacuo after thoracentesis 2 days ago. The lung remains incompletely expanded. I discussed the possibility of attempting another thoracentesis to evacuate air and additional fluid with Raymond Logan. I want to see if the lung can reexpand any further.  Procedure Using sterile technique and local anesthesia with 5 mL of 1% lidocaine right thoracentesis was performed. The syringe method was used. Approximately 250 mL of blood-tinged fluid was evacuated and the bag did fill with air. Patient tolerated the procedure well.   Follow-up chest x-ray Showed some decrease in the size of the hydropneumothorax and some improved expansion of the right lung  Plan: Return in 1 week with PA and lateral chest x-ray  [name], MD [facility] [phone]

## 2016-12-02 NOTE — Progress Notes (Signed)
I agree with the above note, plan 

## 2016-12-02 NOTE — Telephone Encounter (Signed)
Submitted 12/02/16 Key: [id number]

## 2016-12-02 NOTE — Telephone Encounter (Signed)
Patient states that PA needs to be completed for ambien.  Is requesting call to be made to [facility] at [phone].

## 2016-12-03 ENCOUNTER — Other Ambulatory Visit: Payer: Self-pay | Admitting: *Deleted

## 2016-12-03 ENCOUNTER — Ambulatory Visit
Admission: RE | Admit: 2016-12-03 | Discharge: 2016-12-03 | Disposition: A | Discharge status: Home / Self Care | Payer: Medicare Other | Source: Ambulatory Visit | Attending: Radiation Oncology | Admitting: Radiation Oncology

## 2016-12-03 ENCOUNTER — Encounter: Payer: Self-pay | Admitting: *Deleted

## 2016-12-03 ENCOUNTER — Other Ambulatory Visit: Payer: Self-pay | Admitting: Thoracic Surgery (Cardiothoracic Vascular Surgery)

## 2016-12-03 DIAGNOSIS — K219 Gastro-esophageal reflux disease without esophagitis: Secondary | ICD-10-CM | POA: Diagnosis not present

## 2016-12-03 DIAGNOSIS — C342 Malignant neoplasm of middle lobe, bronchus or lung: Secondary | ICD-10-CM | POA: Diagnosis not present

## 2016-12-03 DIAGNOSIS — N400 Enlarged prostate without lower urinary tract symptoms: Secondary | ICD-10-CM | POA: Diagnosis not present

## 2016-12-03 DIAGNOSIS — J90 Pleural effusion, not elsewhere classified: Secondary | ICD-10-CM | POA: Insufficient documentation

## 2016-12-03 DIAGNOSIS — Z510 Encounter for antineoplastic radiation therapy: Secondary | ICD-10-CM | POA: Diagnosis not present

## 2016-12-03 DIAGNOSIS — R51 Headache: Secondary | ICD-10-CM | POA: Diagnosis not present

## 2016-12-03 DIAGNOSIS — J449 Chronic obstructive pulmonary disease, unspecified: Secondary | ICD-10-CM | POA: Diagnosis not present

## 2016-12-04 LAB — TIQ-MISC

## 2016-12-04 LAB — LACTATE DEHYDROGENASE, PLEURAL FLUID: LD, PLEURAL FLUID: 312 U/L

## 2016-12-04 LAB — PROTEIN, PLEURAL FLUID: PROTEIN, TOTAL, PLEURAL FLUID: 4.3 g/dL

## 2016-12-04 LAB — GLUCOSE, PLEURAL OR PERITONEAL FLUID: Glucose, Pleural fluid: 107 mg/dL

## 2016-12-06 ENCOUNTER — Ambulatory Visit
Admission: RE | Admit: 2016-12-06 | Discharge: 2016-12-06 | Disposition: A | Discharge status: Home / Self Care | Payer: Medicare Other | Source: Ambulatory Visit | Attending: Radiation Oncology | Admitting: Radiation Oncology

## 2016-12-06 DIAGNOSIS — K219 Gastro-esophageal reflux disease without esophagitis: Secondary | ICD-10-CM | POA: Diagnosis not present

## 2016-12-06 DIAGNOSIS — C342 Malignant neoplasm of middle lobe, bronchus or lung: Secondary | ICD-10-CM | POA: Diagnosis not present

## 2016-12-06 DIAGNOSIS — J449 Chronic obstructive pulmonary disease, unspecified: Secondary | ICD-10-CM | POA: Diagnosis not present

## 2016-12-06 DIAGNOSIS — N400 Enlarged prostate without lower urinary tract symptoms: Secondary | ICD-10-CM | POA: Diagnosis not present

## 2016-12-06 DIAGNOSIS — R51 Headache: Secondary | ICD-10-CM | POA: Diagnosis not present

## 2016-12-06 DIAGNOSIS — Z510 Encounter for antineoplastic radiation therapy: Secondary | ICD-10-CM | POA: Diagnosis not present

## 2016-12-07 ENCOUNTER — Encounter: Payer: Self-pay | Admitting: Thoracic Surgery (Cardiothoracic Vascular Surgery)

## 2016-12-07 ENCOUNTER — Other Ambulatory Visit: Payer: Self-pay | Admitting: *Deleted

## 2016-12-07 ENCOUNTER — Ambulatory Visit (INDEPENDENT_AMBULATORY_CARE_PROVIDER_SITE_OTHER): Payer: Self-pay | Admitting: Thoracic Surgery (Cardiothoracic Vascular Surgery)

## 2016-12-07 ENCOUNTER — Ambulatory Visit
Admission: RE | Admit: 2016-12-07 | Discharge: 2016-12-07 | Disposition: A | Discharge status: Home / Self Care | Payer: Medicare Other | Source: Ambulatory Visit | Attending: Thoracic Surgery (Cardiothoracic Vascular Surgery) | Admitting: Thoracic Surgery (Cardiothoracic Vascular Surgery)

## 2016-12-07 ENCOUNTER — Ambulatory Visit
Admission: RE | Admit: 2016-12-07 | Discharge: 2016-12-07 | Disposition: A | Discharge status: Home / Self Care | Payer: Medicare Other | Source: Ambulatory Visit | Attending: Radiation Oncology | Admitting: Radiation Oncology

## 2016-12-07 VITALS — BP 102/68 | HR 75 | Ht 69.0 in | Wt 148.0 lb

## 2016-12-07 DIAGNOSIS — R51 Headache: Secondary | ICD-10-CM | POA: Diagnosis not present

## 2016-12-07 DIAGNOSIS — Z510 Encounter for antineoplastic radiation therapy: Secondary | ICD-10-CM | POA: Diagnosis not present

## 2016-12-07 DIAGNOSIS — K219 Gastro-esophageal reflux disease without esophagitis: Secondary | ICD-10-CM | POA: Diagnosis not present

## 2016-12-07 DIAGNOSIS — N400 Enlarged prostate without lower urinary tract symptoms: Secondary | ICD-10-CM | POA: Diagnosis not present

## 2016-12-07 DIAGNOSIS — C342 Malignant neoplasm of middle lobe, bronchus or lung: Secondary | ICD-10-CM | POA: Diagnosis not present

## 2016-12-07 DIAGNOSIS — J948 Other specified pleural conditions: Secondary | ICD-10-CM

## 2016-12-07 DIAGNOSIS — J90 Pleural effusion, not elsewhere classified: Secondary | ICD-10-CM

## 2016-12-07 DIAGNOSIS — C3491 Malignant neoplasm of unspecified part of right bronchus or lung: Secondary | ICD-10-CM

## 2016-12-07 DIAGNOSIS — J939 Pneumothorax, unspecified: Secondary | ICD-10-CM | POA: Diagnosis not present

## 2016-12-07 DIAGNOSIS — J449 Chronic obstructive pulmonary disease, unspecified: Secondary | ICD-10-CM | POA: Diagnosis not present

## 2016-12-07 MED ORDER — PREDNISONE 10 MG (21) PO TBPK: ORAL_TABLET | ORAL | 0 refills | Status: DC

## 2016-12-07 NOTE — Progress Notes (Signed)
[address]       [place] [address]             [phone]    HPI: Raymond Logan returns for follow-up of his right pleural effusion.  He is an 83 year old man who had a thoracoscopic right middle lobectomy on 10/07/2016. He is currently undergoing adjuvant radiation therapy. He is about 4 weeks into his radiation course. I saw him last week and he had a new right pleural effusion. I did a thoracentesis in the office and drained 1.8 L of serous fluid. He tolerated the procedure well but on his post thoracentesis chest x-ray there was a pneumothorax ex vacuo and incomplete reexpansion of the right lung. I saw him back 2 days later on 12/02/2016. His chest x-ray was stable. I aspirated additional fluid and air from the right chest and there was slight improvement in reexpansion of the right lung afterwards.  He says that over the past week he's been feeling a little better. He will have spasms of coughing when he laughs. He feels a little less short of breath. He does not have orthopnea.  Past Medical History:  Diagnosis Date  . ALLERGIC RHINITIS 01/23/2007  . Arthritis    hands  . BENIGN PROSTATIC HYPERTROPHY 09/10/2006  . Chronic headaches   . Complication of anesthesia    difficulty voiding- if cath needed needs to placed with need to be done with scope  . COPD, MILD 04/03/2010   patient denies on preop visit of 08/02/2014   . Difficulty in urination   . FATIGUE 01/08/2008  . GERD 04/15/2010  . H. pylori infection    hx of   . Headache(784.0) 09/10/2006  . HOARSENESS 04/15/2010  . HYPERLIPIDEMIA 09/10/2006  . INSOMNIA-SLEEP DISORDER-UNSPEC 04/24/2009  . PEPTIC ULCER DISEASE 01/23/2007  . Pneumonia    1953    Current Outpatient Prescriptions  Medication Sig Dispense Refill  . acetaminophen (TYLENOL) 500 MG tablet Take 2 tablets (1,000 mg total) by mouth every 6 (six) hours as needed. 30 tablet 0  . aspirin-acetaminophen-caffeine (EXCEDRIN MIGRAINE) 250-250-65 MG  tablet Take 2 tablets by mouth daily as needed for headache or migraine.    . benzonatate (TESSALON PERLES) 100 MG capsule 1-2 tab by mouth three times per day as needed for cough 60 capsule 2  . calcium carbonate (TUMS - DOSED IN MG ELEMENTAL CALCIUM) 500 MG chewable tablet Chew 1 tablet by mouth 3 (three) times daily as needed (for acid reflux.).    [name] eszopiclone (LUNESTA) 1 MG TABS tablet Take 1 mg by mouth at bedtime as needed for sleep. Take immediately before bedtime    . finasteride (PROSCAR) 5 MG tablet Take 5 mg by mouth at bedtime.     . gabapentin (NEURONTIN) 600 MG tablet Take 600 mg by mouth 2 (two) times daily as needed (for pain (typically once daily in the morning)).   0  . guaiFENesin-dextromethorphan (ROBITUSSIN DM) 100-10 MG/5ML syrup Take 5 mLs by mouth every 4 (four) hours as needed for cough. 118 mL 0  . pantoprazole (PROTONIX) 40 MG tablet Take 1 tablet (40 mg total) by mouth daily. 30 tablet 3  . silodosin (RAPAFLO) 8 MG CAPS capsule Take 8 mg by mouth at bedtime.     . sucralfate (CARAFATE) 1 g tablet Take 1 tablet (1 g total) by mouth 4 (four) times daily -  with meals and at bedtime. 90 tablet 0  . traMADol (ULTRAM) 50  MG tablet Take 1 tablet (50 mg total) by mouth every 8 (eight) hours as needed. 120 tablet 2  . Wound Dressings (SONAFINE) Apply 1 application topically daily. Apply to chest area after rad tx daily    . zolpidem (AMBIEN) 5 MG tablet Take 1 tablet (5 mg total) by mouth at bedtime as needed for sleep. 30 tablet 5  . predniSONE (STERAPRED UNI-PAK 21 TAB) 10 MG (21) TBPK tablet Take 6 tabs on day 1, 5 on day 2, 4 on day 3, 3 on day 4, 2 on day 5, 1 on day 6 21 tablet 0   No current facility-administered medications for this visit.     Physical Exam BP 102/68   Pulse 75   Ht 5\' 9"  (1.753 m)   Wt 148 lb (67.1 kg)   SpO2 (!) 80%   BMI 21.9 kg/m  83 year old man in no acute distress Alert and oriented 3 with no focal deficits Lungs diminished breath  sounds on right side Cardiac regular rate and rhythm normal S1 and S2  Diagnostic Tests: CHEST  2 VIEW  COMPARISON:  Two-view chest x-rays 12/02/2016 and 11/30/2016  FINDINGS: The heart size is normal. The fluid component of a right-sided hydropneumothorax the is increasing. The pneumothorax is unchanged. The left lung is clear.  IMPRESSION: 1. Increasing a loculated fluid collection within the right hemithorax. 2. Stable air component of right-sided pneumothorax.   Electronically Signed   By: San Morelle M.D.   On: 12/07/2016 15:06 I personally reviewed the chest x-ray I with the findings noted above  Pleural fluid - LDH 312, total protein 4.3, glucose 107  Impression: Raymond Logan is now about 2 months out from a right middle lobectomy and is currently undergoing adjuvant radiation therapy. About 3 or 4 weeks ago he developed a right pleural effusion. I drained that last week and he had a large space with incomplete reexpansion of the lung afterwards. I aspirated additional air and fluid 2 days later and there was some slight improvement but still incomplete reexpansion. On his chest x-ray today the reexpansion of the lung is stable there has been some loculated fluid at the base that has recurred.  The fluid was consistent with an exudate. This is likely inflammatory in nature. There is nothing to suggest infection. I recommended that we try a repeat aspiration lies in the office today she tolerated the previous 2 well and also a prednisone taper to see if that will cut down on the inflammatory response. He is in agreement  Procedure Right thoracentesis performed using sterile technique and local anesthetic with 5 mL of 1% lidocaine. A 2 L bags filled with approximately 200 mL of fluid and the remainder filled with air. Tolerated well without any issues  Plan: Prednisone taper  Return next week with repeat chest x-ray  [name], MD [facility] [phone]

## 2016-12-08 ENCOUNTER — Ambulatory Visit
Admission: RE | Admit: 2016-12-08 | Discharge: 2016-12-08 | Disposition: A | Discharge status: Home / Self Care | Payer: Medicare Other | Source: Ambulatory Visit | Attending: Radiation Oncology | Admitting: Radiation Oncology

## 2016-12-08 DIAGNOSIS — K219 Gastro-esophageal reflux disease without esophagitis: Secondary | ICD-10-CM | POA: Diagnosis not present

## 2016-12-08 DIAGNOSIS — Z510 Encounter for antineoplastic radiation therapy: Secondary | ICD-10-CM | POA: Diagnosis not present

## 2016-12-08 DIAGNOSIS — R51 Headache: Secondary | ICD-10-CM | POA: Diagnosis not present

## 2016-12-08 DIAGNOSIS — J449 Chronic obstructive pulmonary disease, unspecified: Secondary | ICD-10-CM | POA: Diagnosis not present

## 2016-12-08 DIAGNOSIS — C342 Malignant neoplasm of middle lobe, bronchus or lung: Secondary | ICD-10-CM | POA: Diagnosis not present

## 2016-12-08 DIAGNOSIS — N400 Enlarged prostate without lower urinary tract symptoms: Secondary | ICD-10-CM | POA: Diagnosis not present

## 2016-12-09 ENCOUNTER — Ambulatory Visit
Admission: RE | Admit: 2016-12-09 | Discharge: 2016-12-09 | Disposition: A | Discharge status: Home / Self Care | Payer: Medicare Other | Source: Ambulatory Visit | Attending: Radiation Oncology | Admitting: Radiation Oncology

## 2016-12-09 DIAGNOSIS — J449 Chronic obstructive pulmonary disease, unspecified: Secondary | ICD-10-CM | POA: Diagnosis not present

## 2016-12-09 DIAGNOSIS — N400 Enlarged prostate without lower urinary tract symptoms: Secondary | ICD-10-CM | POA: Diagnosis not present

## 2016-12-09 DIAGNOSIS — K219 Gastro-esophageal reflux disease without esophagitis: Secondary | ICD-10-CM | POA: Diagnosis not present

## 2016-12-09 DIAGNOSIS — R51 Headache: Secondary | ICD-10-CM | POA: Diagnosis not present

## 2016-12-09 DIAGNOSIS — C342 Malignant neoplasm of middle lobe, bronchus or lung: Secondary | ICD-10-CM | POA: Diagnosis not present

## 2016-12-09 DIAGNOSIS — Z510 Encounter for antineoplastic radiation therapy: Secondary | ICD-10-CM | POA: Diagnosis not present

## 2016-12-10 ENCOUNTER — Ambulatory Visit
Admission: RE | Admit: 2016-12-10 | Discharge: 2016-12-10 | Disposition: A | Discharge status: Home / Self Care | Payer: Medicare Other | Source: Ambulatory Visit | Attending: Radiation Oncology | Admitting: Radiation Oncology

## 2016-12-10 DIAGNOSIS — R51 Headache: Secondary | ICD-10-CM | POA: Diagnosis not present

## 2016-12-10 DIAGNOSIS — N400 Enlarged prostate without lower urinary tract symptoms: Secondary | ICD-10-CM | POA: Diagnosis not present

## 2016-12-10 DIAGNOSIS — J449 Chronic obstructive pulmonary disease, unspecified: Secondary | ICD-10-CM | POA: Diagnosis not present

## 2016-12-10 DIAGNOSIS — Z510 Encounter for antineoplastic radiation therapy: Secondary | ICD-10-CM | POA: Diagnosis not present

## 2016-12-10 DIAGNOSIS — C342 Malignant neoplasm of middle lobe, bronchus or lung: Secondary | ICD-10-CM | POA: Diagnosis not present

## 2016-12-10 DIAGNOSIS — K219 Gastro-esophageal reflux disease without esophagitis: Secondary | ICD-10-CM | POA: Diagnosis not present

## 2016-12-13 ENCOUNTER — Ambulatory Visit
Admission: RE | Admit: 2016-12-13 | Discharge: 2016-12-13 | Disposition: A | Discharge status: Home / Self Care | Payer: Medicare Other | Source: Ambulatory Visit | Attending: Radiation Oncology | Admitting: Radiation Oncology

## 2016-12-13 ENCOUNTER — Other Ambulatory Visit: Payer: Self-pay | Admitting: Thoracic Surgery (Cardiothoracic Vascular Surgery)

## 2016-12-13 DIAGNOSIS — R51 Headache: Secondary | ICD-10-CM | POA: Diagnosis not present

## 2016-12-13 DIAGNOSIS — C342 Malignant neoplasm of middle lobe, bronchus or lung: Secondary | ICD-10-CM

## 2016-12-13 DIAGNOSIS — K219 Gastro-esophageal reflux disease without esophagitis: Secondary | ICD-10-CM | POA: Diagnosis not present

## 2016-12-13 DIAGNOSIS — N400 Enlarged prostate without lower urinary tract symptoms: Secondary | ICD-10-CM | POA: Diagnosis not present

## 2016-12-13 DIAGNOSIS — J449 Chronic obstructive pulmonary disease, unspecified: Secondary | ICD-10-CM | POA: Diagnosis not present

## 2016-12-13 DIAGNOSIS — Z510 Encounter for antineoplastic radiation therapy: Secondary | ICD-10-CM | POA: Diagnosis not present

## 2016-12-14 ENCOUNTER — Ambulatory Visit (INDEPENDENT_AMBULATORY_CARE_PROVIDER_SITE_OTHER): Payer: Self-pay | Admitting: Thoracic Surgery (Cardiothoracic Vascular Surgery)

## 2016-12-14 ENCOUNTER — Ambulatory Visit
Admission: RE | Admit: 2016-12-14 | Discharge: 2016-12-14 | Disposition: A | Discharge status: Home / Self Care | Payer: Medicare Other | Source: Ambulatory Visit | Attending: Radiation Oncology | Admitting: Radiation Oncology

## 2016-12-14 ENCOUNTER — Ambulatory Visit
Admission: RE | Admit: 2016-12-14 | Discharge: 2016-12-14 | Disposition: A | Discharge status: Home / Self Care | Payer: Medicare Other | Source: Ambulatory Visit | Attending: Thoracic Surgery (Cardiothoracic Vascular Surgery) | Admitting: Thoracic Surgery (Cardiothoracic Vascular Surgery)

## 2016-12-14 ENCOUNTER — Encounter: Payer: Self-pay | Admitting: Thoracic Surgery (Cardiothoracic Vascular Surgery)

## 2016-12-14 VITALS — BP 108/60 | HR 76 | Ht 69.0 in | Wt 147.0 lb

## 2016-12-14 DIAGNOSIS — R51 Headache: Secondary | ICD-10-CM | POA: Diagnosis not present

## 2016-12-14 DIAGNOSIS — J449 Chronic obstructive pulmonary disease, unspecified: Secondary | ICD-10-CM | POA: Diagnosis not present

## 2016-12-14 DIAGNOSIS — C342 Malignant neoplasm of middle lobe, bronchus or lung: Secondary | ICD-10-CM | POA: Diagnosis not present

## 2016-12-14 DIAGNOSIS — K219 Gastro-esophageal reflux disease without esophagitis: Secondary | ICD-10-CM | POA: Diagnosis not present

## 2016-12-14 DIAGNOSIS — N400 Enlarged prostate without lower urinary tract symptoms: Secondary | ICD-10-CM | POA: Diagnosis not present

## 2016-12-14 DIAGNOSIS — J939 Pneumothorax, unspecified: Secondary | ICD-10-CM | POA: Diagnosis not present

## 2016-12-14 DIAGNOSIS — Z510 Encounter for antineoplastic radiation therapy: Secondary | ICD-10-CM | POA: Diagnosis not present

## 2016-12-14 DIAGNOSIS — J948 Other specified pleural conditions: Secondary | ICD-10-CM

## 2016-12-14 NOTE — Progress Notes (Signed)
[address]       [place] [address]             [phone]     HPI: Mr. Pidcock returns for follow-up of his right hydropneumothorax  He is an 83 year old man who had a thoracoscopic right middle lobectomy August 9. He is currently undergoing adjuvant radiation therapy due to a close margin. I saw him in the office on October 2. He had a new right effusion from his chest x-ray at the end of August. Actually the effusion was present on a CT of the abdomen was done in late September, but nothing was done about it at that time.  I did a thoracentesis and drained 1.8 L of fluid. He developed a pneumothorax ex vacuo. I saw him back 2 days later and did a repeat thoracentesis and had a little more reexpansion of the lung but there still was a significant space. I saw him again a week ago and his right lung was unchanged. I did a thoracentesis and evacuated a significant amount of air and a small amount of fluid.  He now returns for follow-up. His wife is with him today. He denies any shortness of breath, coughing, or wheezing.He feels like his memory is getting worse. He says that he forgot how to start his wife's car. His wife says that he is not sleeping, although he thinks he sleeping pretty well. He has been taking Ambien 5 mg nightly. His appetite is poor.  Past Medical History:  Diagnosis Date  . ALLERGIC RHINITIS 01/23/2007  . Arthritis    hands  . BENIGN PROSTATIC HYPERTROPHY 09/10/2006  . Chronic headaches   . Complication of anesthesia    difficulty voiding- if cath needed needs to placed with need to be done with scope  . COPD, MILD 04/03/2010   patient denies on preop visit of 08/02/2014   . Difficulty in urination   . FATIGUE 01/08/2008  . GERD 04/15/2010  . H. pylori infection    hx of   . Headache(784.0) 09/10/2006  . HOARSENESS 04/15/2010  . HYPERLIPIDEMIA 09/10/2006  . INSOMNIA-SLEEP DISORDER-UNSPEC 04/24/2009  . PEPTIC ULCER DISEASE 01/23/2007  .  Pneumonia    1953    Current Outpatient Prescriptions  Medication Sig Dispense Refill  . acetaminophen (TYLENOL) 500 MG tablet Take 2 tablets (1,000 mg total) by mouth every 6 (six) hours as needed. 30 tablet 0  . aspirin-acetaminophen-caffeine (EXCEDRIN MIGRAINE) 250-250-65 MG tablet Take 2 tablets by mouth daily as needed for headache or migraine.    . benzonatate (TESSALON PERLES) 100 MG capsule 1-2 tab by mouth three times per day as needed for cough 60 capsule 2  . calcium carbonate (TUMS - DOSED IN MG ELEMENTAL CALCIUM) 500 MG chewable tablet Chew 1 tablet by mouth 3 (three) times daily as needed (for acid reflux.).    [name] eszopiclone (LUNESTA) 1 MG TABS tablet Take 1 mg by mouth at bedtime as needed for sleep. Take immediately before bedtime    . finasteride (PROSCAR) 5 MG tablet Take 5 mg by mouth at bedtime.     . gabapentin (NEURONTIN) 600 MG tablet Take 600 mg by mouth 2 (two) times daily as needed (for pain (typically once daily in the morning)).   0  . guaiFENesin-dextromethorphan (ROBITUSSIN DM) 100-10 MG/5ML syrup Take 5 mLs by mouth every 4 (four) hours as needed for cough. 118 mL 0  . pantoprazole (PROTONIX) 40 MG tablet Take 1  tablet (40 mg total) by mouth daily. 30 tablet 3  . predniSONE (STERAPRED UNI-PAK 21 TAB) 10 MG (21) TBPK tablet Take 6 tabs on day 1, 5 on day 2, 4 on day 3, 3 on day 4, 2 on day 5, 1 on day 6 21 tablet 0  . silodosin (RAPAFLO) 8 MG CAPS capsule Take 8 mg by mouth at bedtime.     . sucralfate (CARAFATE) 1 g tablet Take 1 tablet (1 g total) by mouth 4 (four) times daily -  with meals and at bedtime. 90 tablet 0  . traMADol (ULTRAM) 50 MG tablet Take 1 tablet (50 mg total) by mouth every 8 (eight) hours as needed. 120 tablet 2  . Wound Dressings (SONAFINE) Apply 1 application topically daily. Apply to chest area after rad tx daily    . zolpidem (AMBIEN) 5 MG tablet Take 1 tablet (5 mg total) by mouth at bedtime as needed for sleep. 30 tablet 5   No current  facility-administered medications for this visit.     Physical Exam BP 108/60   Pulse 76   Ht 5\' 9"  (1.753 m)   Wt 147 lb (66.7 kg)   SpO2 98%   BMI 21.77 kg/m  83 year old man in no acute distress Alert and oriented x 3 with no focal neurologic deficits Lungs diminished breath sounds on the right  Diagnostic Tests: CHEST  2 VIEW  COMPARISON:  Chest x-ray 12/07/2016.  FINDINGS: Persistent right-sided hydropneumothorax with large pneumothorax component increasing, now at least 60% of the volume of the right hemithorax. Decreasing volume of right-sided pleural fluid, now small. Left lung is clear. Trace left pleural effusion. No evidence of pulmonary edema. Heart size is normal. Postoperative changes of right middle lobectomy. Heart size is normal. Upper mediastinal contours are unremarkable. Aortic atherosclerosis.  IMPRESSION: 1. Large right hydropneumothorax with increasing pneumothorax component, now at least 60% of the volume of the right hemithorax. 2. Trace left pleural effusion. 3. Aortic atherosclerosis. These results will be called to the ordering clinician or representative by the Radiologist Assistant, and communication documented in the PACS or zVision Dashboard.   Electronically Signed   By: Vinnie Langton M.D.   On: 12/14/2016 12:07 I personally reviewed the chest x-ray images and concur with the findings noted above  Impression: Mr. Kandler is an 82 year old man who had a right middle lobectomy about 2 months ago and is currently undergoing adjuvant radiation therapy. Somewhere along the line in late September he developed a right pleural effusion. It was an exudative effusion. He has a partially trapped right lung. Repeated thoracenteses have failed to get complete reexpansion of the right lung. He completed his steroid taper and that did not have any impact.  Currently there are 3 options. One is continued observation. He is not symptomatic from  this. I favor that option at present. Second option would be to place a pleural catheter. That'll be mostly for palliative reasons and he is currently not particularly symptomatic. He final option would be right VATS for decortication. I don't think that's a good idea at this time as it would interfere with his radiation and also I think he needs more recovery.  Regarding hissleep disturbance. His wife seems much more disturbed by this and he has. He is taking Ambien which may have as much to do with his "fogginess" as the lack of sleep. I recommended he try melatonin to see if that would be effective.   Plan: Return in 3 weeks  with a repeat PA and lateral chest x-ray  [name], MD [facility] [phone]

## 2016-12-15 ENCOUNTER — Ambulatory Visit (INDEPENDENT_AMBULATORY_CARE_PROVIDER_SITE_OTHER): Payer: Medicare Other | Admitting: Internal Medicine

## 2016-12-15 ENCOUNTER — Other Ambulatory Visit (INDEPENDENT_AMBULATORY_CARE_PROVIDER_SITE_OTHER): Payer: Medicare Other

## 2016-12-15 ENCOUNTER — Encounter: Payer: Self-pay | Admitting: Internal Medicine

## 2016-12-15 ENCOUNTER — Ambulatory Visit
Admission: RE | Admit: 2016-12-15 | Discharge: 2016-12-15 | Disposition: A | Discharge status: Home / Self Care | Payer: Medicare Other | Source: Ambulatory Visit | Attending: Radiation Oncology | Admitting: Radiation Oncology

## 2016-12-15 VITALS — BP 108/70 | HR 87 | Temp 98.4°F | Ht 69.0 in | Wt 150.0 lb

## 2016-12-15 DIAGNOSIS — R413 Other amnesia: Secondary | ICD-10-CM

## 2016-12-15 DIAGNOSIS — E611 Iron deficiency: Secondary | ICD-10-CM

## 2016-12-15 DIAGNOSIS — C342 Malignant neoplasm of middle lobe, bronchus or lung: Secondary | ICD-10-CM | POA: Diagnosis not present

## 2016-12-15 DIAGNOSIS — D649 Anemia, unspecified: Secondary | ICD-10-CM | POA: Diagnosis not present

## 2016-12-15 DIAGNOSIS — J449 Chronic obstructive pulmonary disease, unspecified: Secondary | ICD-10-CM | POA: Diagnosis not present

## 2016-12-15 DIAGNOSIS — Z510 Encounter for antineoplastic radiation therapy: Secondary | ICD-10-CM | POA: Diagnosis not present

## 2016-12-15 DIAGNOSIS — R5383 Other fatigue: Secondary | ICD-10-CM | POA: Diagnosis not present

## 2016-12-15 DIAGNOSIS — K219 Gastro-esophageal reflux disease without esophagitis: Secondary | ICD-10-CM | POA: Diagnosis not present

## 2016-12-15 DIAGNOSIS — R51 Headache: Secondary | ICD-10-CM | POA: Diagnosis not present

## 2016-12-15 DIAGNOSIS — N400 Enlarged prostate without lower urinary tract symptoms: Secondary | ICD-10-CM | POA: Diagnosis not present

## 2016-12-15 IMAGING — RF DG MYELOGRAPHY LUMBAR INJ LUMBOSACRAL
13 of 15 series · 13 of 15 positions shown · non-contrast
Comparison: Outside MRI from [facility].

CLINICAL DATA: Low back pain. RIGHT leg pain. Symptom duration
unspecified.
TECHNIQUE: Contiguous axial images were obtained through the Lumbar spine after
the intrathecal infusion of infusion. Coronal and sagittal
reconstructions were obtained of the axial image sets.

[Series 2: (hospital) · 1 of 1 slices shown]
[im 1/1]
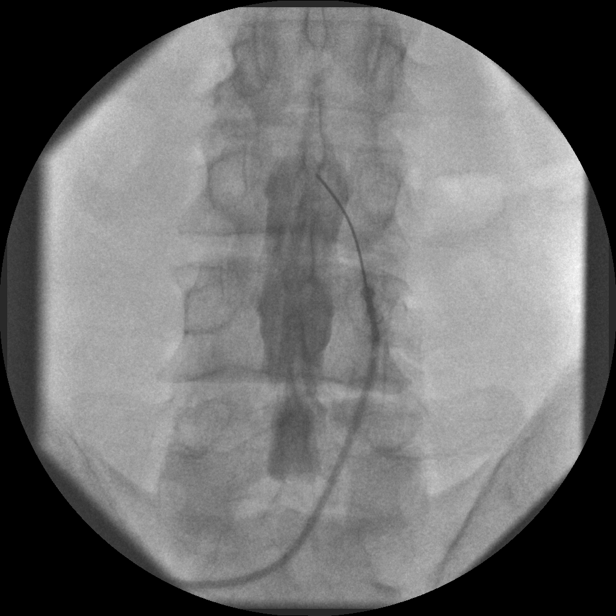

[Series 3: myelogram  white · 1 of 1 slices shown (1 of 12)]
[im 1/1]
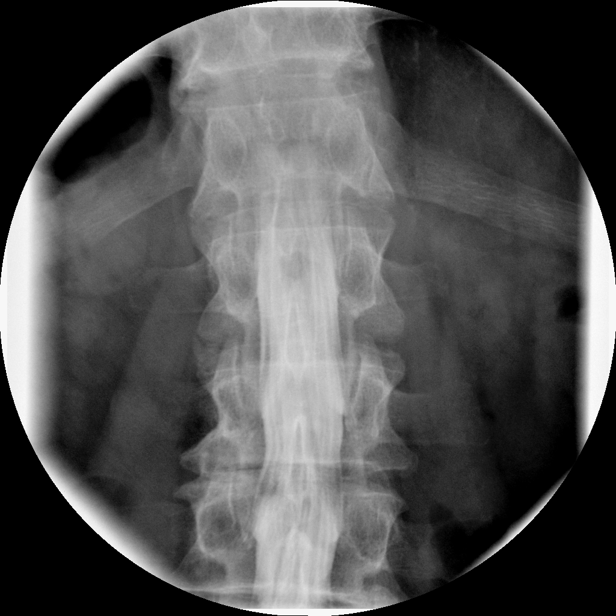

[Series 4: myelogram  white · 1 of 1 slices shown (2 of 12)]
[im 1/1]
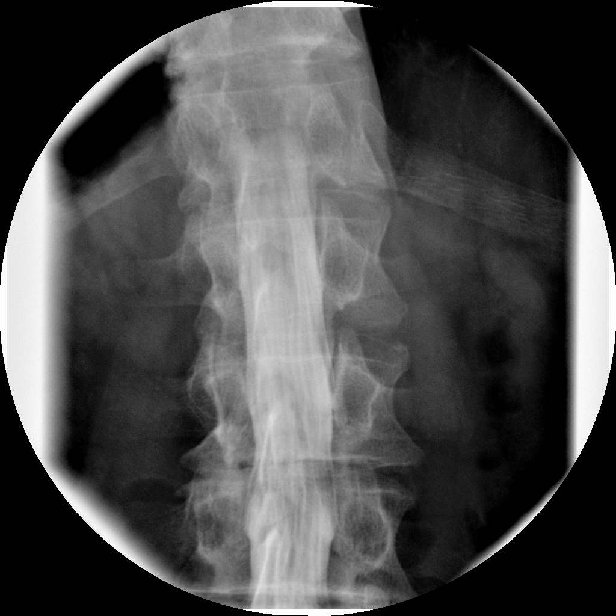

[Series 6: myelogram  white · 1 of 1 slices shown (3 of 12)]
[im 1/1]
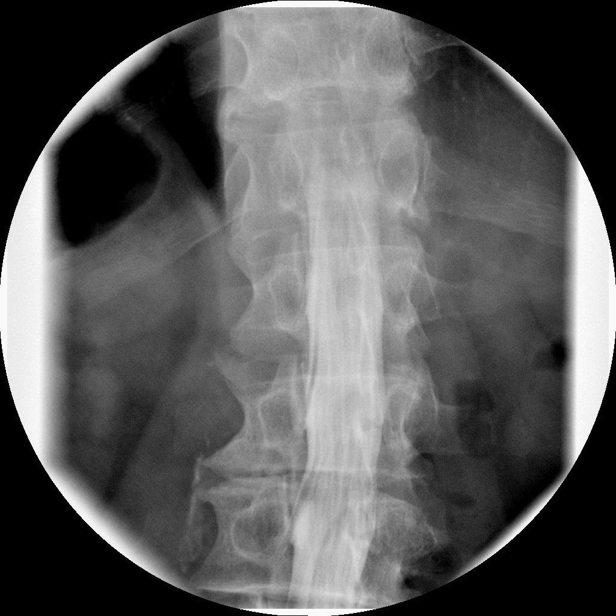

[Series 7: myelogram  white · 1 of 1 slices shown (4 of 12)]
[im 1/1]
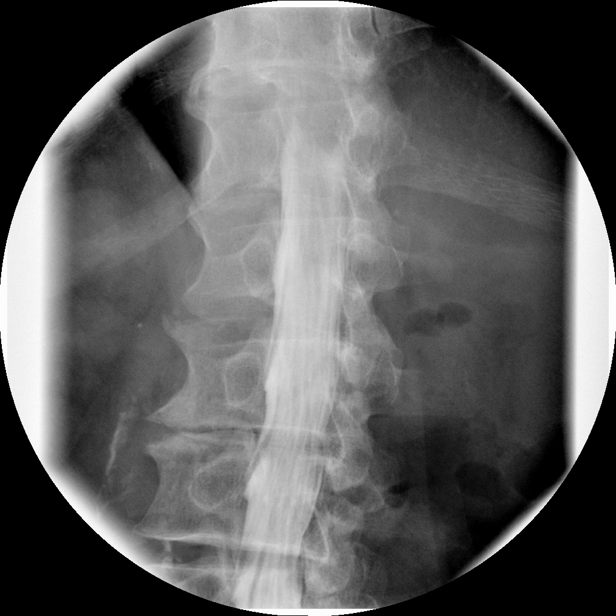

[Series 8: myelogram  white · 1 of 1 slices shown (5 of 12)]
[im 1/1]
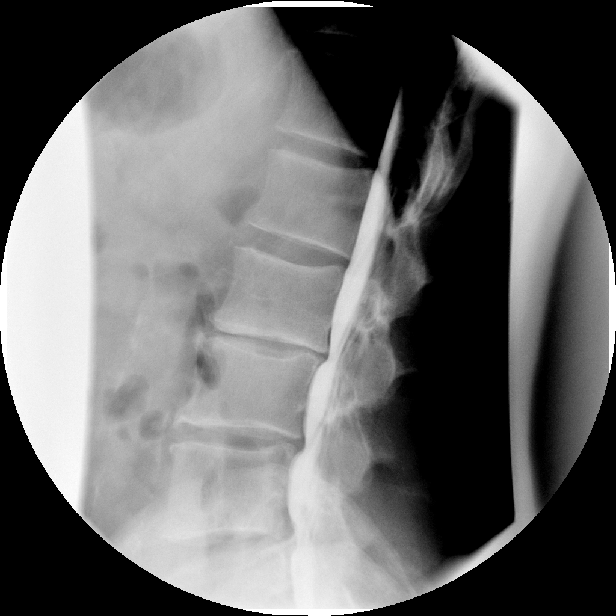

[Series 9: myelogram  white · 1 of 1 slices shown (6 of 12)]
[im 1/1]
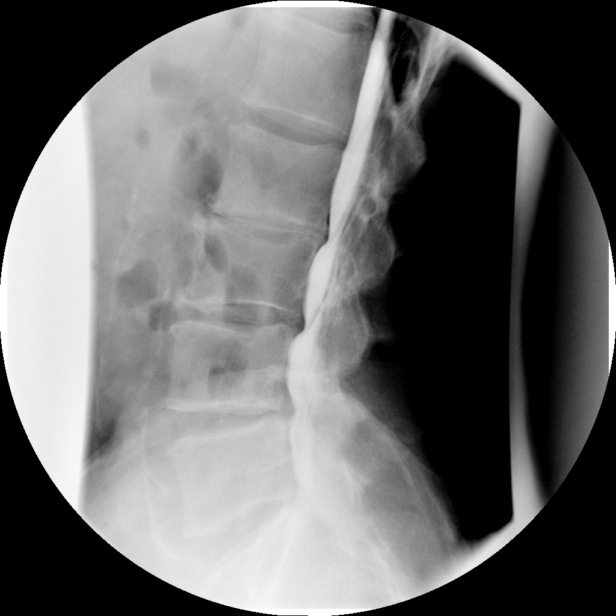

[Series 10: myelogram  white · 1 of 1 slices shown (7 of 12)]
[im 1/1]
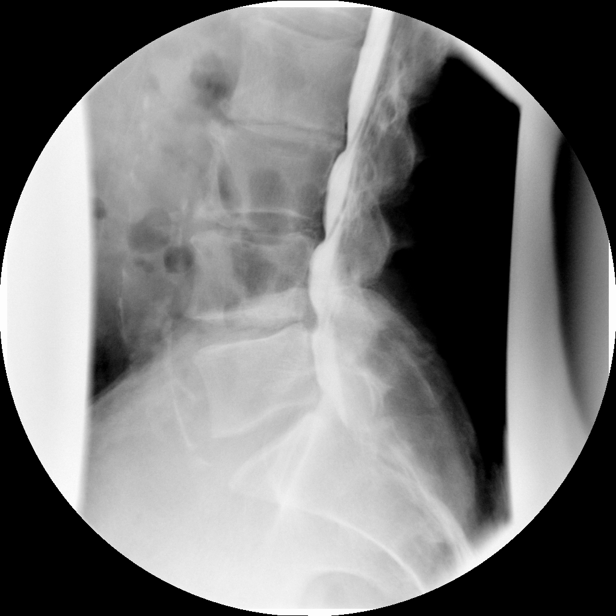

[Series 13: myelogram  white · 1 of 1 slices shown (8 of 12)]
[im 1/1]
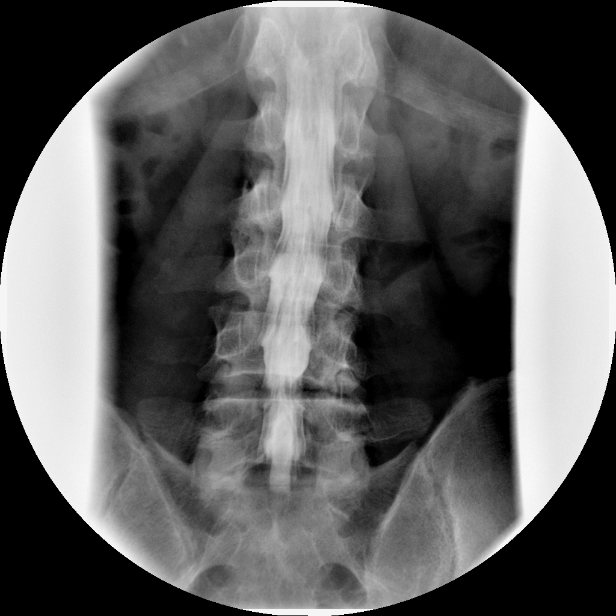

[Series 14: myelogram  white · 1 of 1 slices shown (9 of 12)]
[im 1/1]
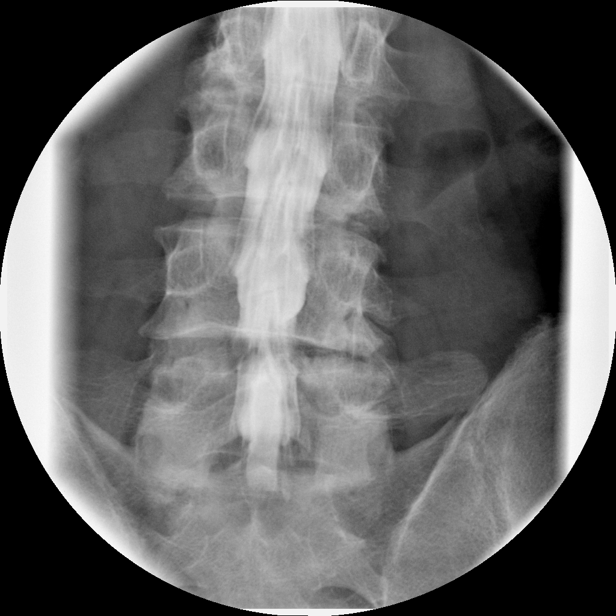

[Series 16: myelogram  white · 1 of 1 slices shown (10 of 12)]
[im 1/1]
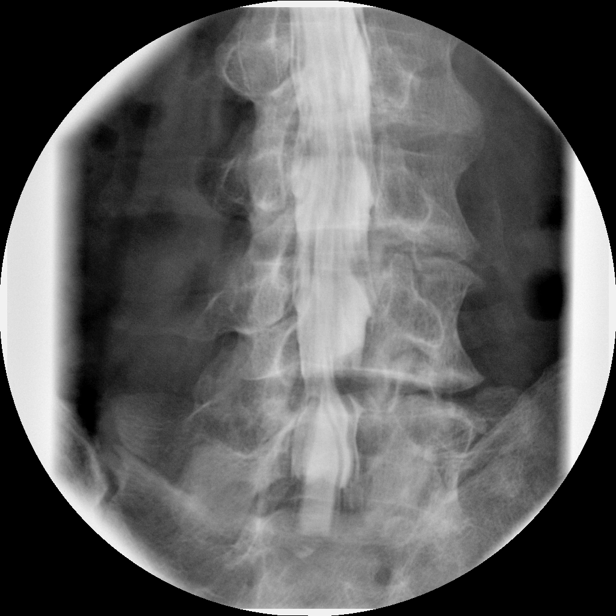

[Series 17: myelogram  white · 1 of 1 slices shown (11 of 12)]
[im 1/1]
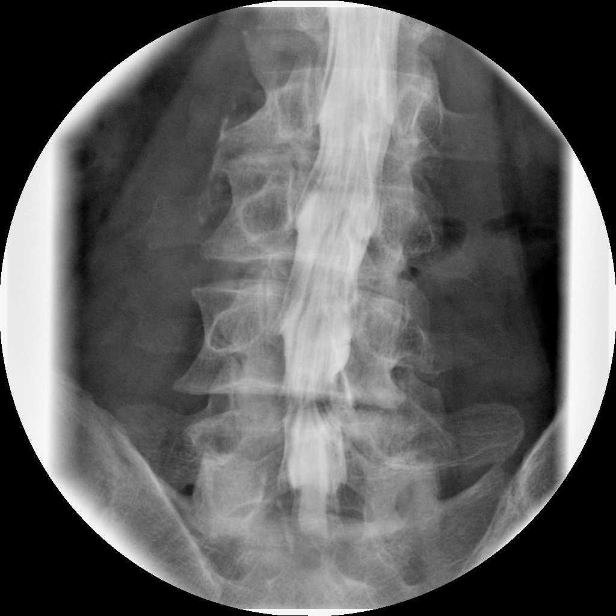

[Series 18: myelogram  white · 1 of 1 slices shown (12 of 12)]
[im 1/1]
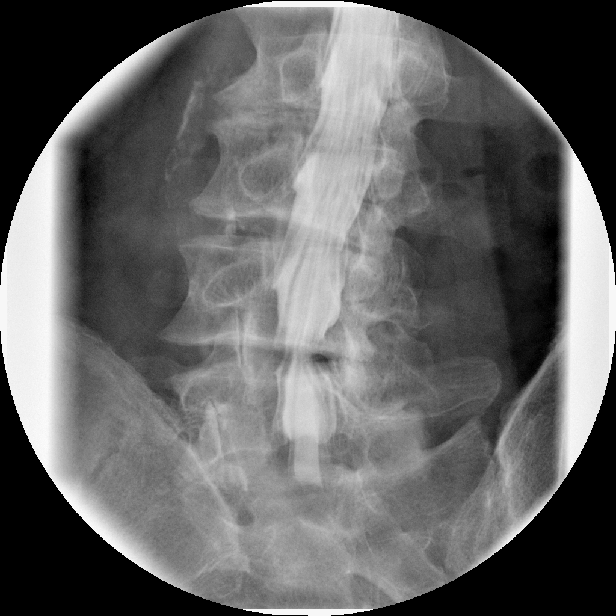

[13 of 15 positions shown; findings below may reference images not displayed]

EXAM:
LUMBAR MYELOGRAM

FLUOROSCOPY TIME:  36 seconds.  Eighteen spot films.

PROCEDURE:
After thorough discussion of risks and benefits of the procedure
including bleeding, infection, injury to nerves, blood vessels,
adjacent structures as well as headache and CSF leak, written and
oral informed consent was obtained. Consent was obtained by Dr. Jumper
Ordahl. Time out form was completed.

Patient was positioned prone on the fluoroscopy table. Local
anesthesia was provided with 1% lidocaine without epinephrine after
prepped and draped in the usual sterile fashion. Puncture was
performed at L2-3 using a 3 1/2 inch 22-gauge spinal needle via
midline approach. Using a single pass through the dura, the needle
was placed within the thecal sac, with return of clear CSF. 15 mL of
Fmnipaque-FD3 was injected into the thecal sac, with normal
opacification of the nerve roots and cauda equina consistent with
free flow within the subarachnoid space.

I personally performed the lumbar puncture and administered the
intrathecal contrast. I also personally supervised acquisition of
the myelogram images.
FINDINGS: LUMBAR MYELOGRAM FINDINGS:

Good opacification of the lumbar subarachnoid space. Minor
degenerative scoliosis convex RIGHT upper lumbar region. Asymmetric
loss of interspace height at L4-5 on the RIGHT is contributory.
Waist like narrowing at L4-5 with moderate to severe stenosis.
BILATERAL L5 nerve root encroachment is worse on the RIGHT.

Severe disc space narrowing is present at L2-L3. Mild to moderate
disc space narrowing at L3-4 and L4-5 with endplate reactive
changes.

With patient standing, stenosis at L4-5 is worse as seen on the AP
view.

Standing lateral flexion extension views demonstrate anatomic
alignment without dynamic instability.

CT LUMBAR MYELOGRAM FINDINGS:

Segmentation: Normal.

Alignment:  Normal.

Vertebrae: No worrisome osseous lesion.

Conus medullaris: Normal in size, signal, and location.

Paraspinal tissues: No evidence for hydronephrosis or paravertebral
mass.

Disc levels:

L1-L2:  Normal.

L2-L3: Trace retrolisthesis of 1 mm. Advanced disc space narrowing.
Endplate reactive changes are worse on the LEFT. Leftward disc
osteophyte complex narrows the foramen and subarticular zone. Slight
LEFT L2 and LEFT L3 nerve root impingement is not excluded.

L3-L4: Mild bulge. Mild facet arthropathy. Mild stenosis. No
definite L4 or L3 nerve root impingement.

L4-L5: Trace anterolisthesis of 1 mm. Central disc protrusion.
Advanced facet arthropathy and ligamentum flavum hypertrophy.
Moderate central canal stenosis. Asymmetric loss of interspace
height on the RIGHT with subchondral cyst formation. RIGHT greater
than LEFT L4 and L5 nerve root impingement. Sclerotic change of the
L4 and L5 spinous process these suggesting Baastrup's disease.

L5-S1: Shallow central and leftward protrusion. Mild facet
arthropathy. No significant subarticular zone or foraminal zone
narrowing.
IMPRESSION: LUMBAR MYELOGRAM IMPRESSION:

The dominant area of stenosis is at L4-5 where BILATERAL RIGHT
greater than LEFT L5 nerve root impingement is observed. This is
worse with patient standing.

No significant dynamic instability with regard to anterolisthesis at
L4-5 or elsewhere.

CT LUMBAR MYELOGRAM IMPRESSION:

Multifactorial moderately severe spinal stenosis at L4-5 related to
central disc protrusion, posterior element hypertrophy, and
asymmetric disc space narrowing on the RIGHT. RIGHT greater than
LEFT L4 and L5 nerve root impingement is present.

Minor lumbar spondylosis elsewhere as described.

## 2016-12-15 MED ORDER — DRONABINOL 2.5 MG PO CAPS: 2.5000 mg | ORAL_CAPSULE | Freq: Two times a day (BID) | ORAL | 1 refills | Status: DC

## 2016-12-15 NOTE — Progress Notes (Signed)
Subjective:    Patient ID: Raymond Logan, male    DOB: [birth year], 83 y.o.   MRN: [id number]  HPI  Here with daughter visiting for a time from [place], wearing a mask herself in the exam room though not ill as she is fearful of contracting some kind of infection in a health care facility.  Displays a significant knowledge of cannabis related pros and cons and different related entities including THC and CBD and other; she herself had been treated with Marinol for a time and is desperate for me to prescribe this for her father whom she sees having difficulty tolerating his malignancy in that he has lower appetite, less muscle mass ("he's wasting away"), general weakness, "Foggy mind", lost 16 lbs per pt, and more and more forgetful/memory issues.  Is currently having XRT for about next 2 wks.  Daughter has numerous questions about nutritional deficiencies and treatment, and he is currently getting Ensure and she is otherwise quite supportive for him.  He did have malignant effusion drained recently 2L and is considering having the catheter placed.   Pt denies fever or recent falls. Denies worsening reflux, abd pain, dysphagia, n/v, bowel change or blood.   Past Medical History:  Diagnosis Date  . ALLERGIC RHINITIS 01/23/2007  . Arthritis    hands  . BENIGN PROSTATIC HYPERTROPHY 09/10/2006  . Chronic headaches   . Complication of anesthesia    difficulty voiding- if cath needed needs to placed with need to be done with scope  . COPD, MILD 04/03/2010   patient denies on preop visit of 08/02/2014   . Difficulty in urination   . FATIGUE 01/08/2008  . GERD 04/15/2010  . H. pylori infection    hx of   . Headache(784.0) 09/10/2006  . HOARSENESS 04/15/2010  . HYPERLIPIDEMIA 09/10/2006  . INSOMNIA-SLEEP DISORDER-UNSPEC 04/24/2009  . PEPTIC ULCER DISEASE 01/23/2007  . Pneumonia    1955   Past Surgical History:  Procedure Laterality Date  . CATARACT EXTRACTION Left   . COLONOSCOPY    . CYSTOSCOPY  N/A 10/07/2016   Procedure: CYSTOSCOPY;  Surgeon: Festus Aloe, MD;  Location: [place] [facility];  Service: Urology;  Laterality: N/A;  . HEMORRHOID BANDING    . INSERTION OF SUPRAPUBIC CATHETER N/A 10/07/2016   Procedure: FOLEY  CATHETER PLACEMENT;  Surgeon: Festus Aloe, MD;  Location: [facility];  Service: Urology;  Laterality: N/A;  . KNEE ARTHROSCOPY Left    torn meniscus  . LUMBAR LAMINECTOMY/DECOMPRESSION MICRODISCECTOMY Right 08/07/2014   Procedure: complete DECOMPRESSION LUMBAR LAMINECTOMY/MICRODISCECTOMY OF L4-L5 for spinal stenosis, DECOMPRESSION OF L3-L4;  Surgeon: Latanya Maudlin, MD;  Location: [facility];  Service: Orthopedics;  Laterality: Right;  . ROTATOR CUFF REPAIR Right   . SHOULDER ARTHROSCOPY     x3, L & R  . VIDEO ASSISTED THORACOSCOPY (VATS)/ LOBECTOMY Right 10/07/2016   Procedure: VIDEO ASSISTED THORACOSCOPY (VATS)/RIGHT MIDDLE LOBECTOMY;  Surgeon: [name], MD;  Location: [facility];  Service: Thoracic;  Laterality: Right;  [name] VIDEO BRONCHOSCOPY WITH ENDOBRONCHIAL NAVIGATION N/A 09/23/2016   Procedure: VIDEO BRONCHOSCOPY WITH ENDOBRONCHIAL NAVIGATION;  Surgeon: [name], MD;  Location: [facility];  Service: Thoracic;  Laterality: N/A;  . VIDEO BRONCHOSCOPY WITH ENDOBRONCHIAL ULTRASOUND N/A 09/23/2016   Procedure: VIDEO BRONCHOSCOPY WITH ENDOBRONCHIAL ULTRASOUND;  Surgeon: [name], MD;  Location: [facility];  Service: Thoracic;  Laterality: N/A;    reports that he quit smoking about 40 years ago. He has a 23.00 pack-year smoking history. He has never used  smokeless tobacco. He reports that he does not drink alcohol or use drugs. family history includes Alzheimer's disease in his mother and sister; Prostate cancer in his brother. Allergies  Allergen Reactions  . Alfuzosin Other (See Comments)    BP went crazy, dizzy, passing out   . Aricept [Donepezil Hcl] Other (See Comments)    Insomnia, headache  . Penicillins Hives    PATIENT HAS HAD A PCN REACTION WITH  IMMEDIATE RASH, FACIAL/TONGUE/THROAT SWELLING, SOB, OR LIGHTHEADEDNESS WITH HYPOTENSION:  #  #  #  YES  #  #  #   Has patient had a PCN reaction causing severe rash involving mucus membranes or skin necrosis:No Has patient had a PCN reaction that required hospitalization:No Has patient had a PCN reaction occurring within the last 10 years:No If all of the above answers are "NO", then may proceed with Cephalosporin use.    Current Outpatient Prescriptions on File Prior to Visit  Medication Sig Dispense Refill  . acetaminophen (TYLENOL) 500 MG tablet Take 2 tablets (1,000 mg total) by mouth every 6 (six) hours as needed. 30 tablet 0  . aspirin-acetaminophen-caffeine (EXCEDRIN MIGRAINE) 250-250-65 MG tablet Take 2 tablets by mouth daily as needed for headache or migraine.    . calcium carbonate (TUMS - DOSED IN MG ELEMENTAL CALCIUM) 500 MG chewable tablet Chew 1 tablet by mouth 3 (three) times daily as needed (for acid reflux.).    [name] finasteride (PROSCAR) 5 MG tablet Take 5 mg by mouth at bedtime.     . gabapentin (NEURONTIN) 600 MG tablet Take 600 mg by mouth 2 (two) times daily as needed (for pain (typically once daily in the morning)).   0  . pantoprazole (PROTONIX) 40 MG tablet Take 1 tablet (40 mg total) by mouth daily. 30 tablet 3  . silodosin (RAPAFLO) 8 MG CAPS capsule Take 8 mg by mouth at bedtime.     . traMADol (ULTRAM) 50 MG tablet Take 1 tablet (50 mg total) by mouth every 8 (eight) hours as needed. 120 tablet 2  . zolpidem (AMBIEN) 5 MG tablet Take 1 tablet (5 mg total) by mouth at bedtime as needed for sleep. 30 tablet 5   No current facility-administered medications on file prior to visit.    Review of Systems  Constitutional: Negative for other unusual diaphoresis or sweats HENT: Negative for ear discharge or swelling Eyes: Negative for other worsening visual disturbances Respiratory: Negative for stridor or other swelling  Gastrointestinal: Negative for worsening distension  or other blood Genitourinary: Negative for retention or other urinary change Musculoskeletal: Negative for other MSK pain or swelling Skin: Negative for color change or other new lesions Neurological: Negative for worsening tremors and other numbness  Psychiatric/Behavioral: Negative for worsening agitation or other fatigue All other system neg per pt    Objective:   Physical Exam BP 108/70   Pulse 87   Temp 98.4 F (36.9 C) (Oral)   Ht 5\' 9"  (1.753 m)   Wt 150 lb (68 kg)   SpO2 98%   BMI 22.15 kg/m  VS noted, thinner, general weak but able to get up on exam table without assist Constitutional: Pt appears in NAD HENT: Head: NCAT.  Right Ear: External ear normal.  Left Ear: External ear normal.  Eyes: . Pupils are equal, round, and reactive to light. Conjunctivae and EOM are normal Nose: without d/c or deformity Neck: Neck supple. Gross normal ROM Cardiovascular: Normal rate and regular rhythm.   Pulmonary/Chest: Effort normal and  breath sounds without rales or wheezing. but decreased right lower lung field Abd:  Soft, NT, ND, + BS, no organomegaly Neurological: Pt is alert. At baseline orientation, motor grossly intact Skin: Skin is warm. No rashes, other new lesions, no LE edema Psychiatric: Pt behavior is normal without agitation  No other exam findings Lab Results  Component Value Date   WBC 7.6 11/16/2016   HGB 11.9 (L) 11/16/2016   HCT 34.9 (L) 11/16/2016   PLT 462 (H) 11/16/2016   GLUCOSE CANCELED 11/30/2016   CHOL 179 03/11/2016   TRIG 188.0 (H) 03/11/2016   HDL 55.30 03/11/2016   LDLDIRECT 134.4 03/27/2010   LDLCALC 86 03/11/2016   ALT 13 (L) 11/16/2016   AST 18 11/16/2016   NA 138 11/16/2016   K 3.9 11/16/2016   CL 103 11/16/2016   CREATININE 1.27 (H) 11/16/2016   BUN 17 11/16/2016   CO2 26 11/16/2016   TSH 0.77 03/11/2016   PSA normal per urology 06/30/2007   INR 1.07 10/05/2016   HGBA1C 5.0 03/11/2016       Assessment & Plan:

## 2016-12-15 NOTE — Assessment & Plan Note (Signed)
Likely anemia chronic dz recent onset, to check iron per daughter reqeust

## 2016-12-15 NOTE — Assessment & Plan Note (Signed)
More forgetful and "foggy", possibly related to XRT, will check B12

## 2016-12-15 NOTE — Assessment & Plan Note (Signed)
stable overall by history and exam, and pt to continue medical treatment as before,  to f/u any worsening symptoms or concerns 

## 2016-12-15 NOTE — Assessment & Plan Note (Signed)
Likely related to malignancy, does not appear to need PT at this time, gave limited rx for [facility]

## 2016-12-15 NOTE — Patient Instructions (Signed)
Please take all new medication as prescribed - the marinol after radiation is finished and [place] with your oncologist  Please continue all other medications as before, and refills have been done if requested.  Please have the pharmacy call with any other refills you may need.  Please keep your appointments with your specialists as you may have planned  Please go to the LAB in the Basement (turn left off the elevator) for the tests to be done today  You will be contacted by phone if any changes need to be made immediately.  Otherwise, you will receive a letter about your results with an explanation, but please check with MyChart first.  Please remember to sign up for MyChart if you have not done so, as this will be important to you in the future with finding out test results, communicating by private email, and scheduling acute appointments online when needed.

## 2016-12-16 ENCOUNTER — Encounter: Payer: Self-pay | Admitting: Internal Medicine

## 2016-12-16 ENCOUNTER — Ambulatory Visit
Admission: RE | Admit: 2016-12-16 | Discharge: 2016-12-16 | Disposition: A | Discharge status: Home / Self Care | Payer: Medicare Other | Source: Ambulatory Visit | Attending: Radiation Oncology | Admitting: Radiation Oncology

## 2016-12-16 DIAGNOSIS — R51 Headache: Secondary | ICD-10-CM | POA: Diagnosis not present

## 2016-12-16 DIAGNOSIS — C342 Malignant neoplasm of middle lobe, bronchus or lung: Secondary | ICD-10-CM | POA: Diagnosis not present

## 2016-12-16 DIAGNOSIS — J449 Chronic obstructive pulmonary disease, unspecified: Secondary | ICD-10-CM | POA: Diagnosis not present

## 2016-12-16 DIAGNOSIS — K219 Gastro-esophageal reflux disease without esophagitis: Secondary | ICD-10-CM | POA: Diagnosis not present

## 2016-12-16 DIAGNOSIS — Z510 Encounter for antineoplastic radiation therapy: Secondary | ICD-10-CM | POA: Diagnosis not present

## 2016-12-16 DIAGNOSIS — N400 Enlarged prostate without lower urinary tract symptoms: Secondary | ICD-10-CM | POA: Diagnosis not present

## 2016-12-16 LAB — IBC PANEL
Iron: 32 ug/dL — ABNORMAL LOW (ref 42–165)
SATURATION RATIOS: 13.1 % — AB (ref 20.0–50.0)
TRANSFERRIN: 175 mg/dL — AB (ref 212.0–360.0)

## 2016-12-16 LAB — BASIC METABOLIC PANEL
BUN: 21 mg/dL (ref 6–23)
CHLORIDE: 102 meq/L (ref 96–112)
CO2: 29 meq/L (ref 19–32)
CREATININE: 1.26 mg/dL (ref 0.40–1.50)
Calcium: 8.7 mg/dL (ref 8.4–10.5)
GFR: 57.92 mL/min — ABNORMAL LOW (ref >60.00)
Glucose, Bld: 94 mg/dL (ref 70–99)
POTASSIUM: 4.1 meq/L (ref 3.5–5.1)
Sodium: 138 mEq/L (ref 135–145)

## 2016-12-16 LAB — CBC WITH DIFFERENTIAL/PLATELET
BASOS ABS: 0.1 10*3/uL (ref 0.0–0.1)
Basophils Relative: 1.2 % (ref 0.0–3.0)
Eosinophils Absolute: 0.1 10*3/uL (ref 0.0–0.7)
Eosinophils Relative: 2.4 % (ref 0.0–5.0)
HEMATOCRIT: 33.4 % — AB (ref 39.0–52.0)
Hemoglobin: 11 g/dL — ABNORMAL LOW (ref 13.0–17.0)
LYMPHS PCT: 13.8 % (ref 12.0–46.0)
Lymphs Abs: 0.8 10*3/uL (ref 0.7–4.0)
MCHC: 33 g/dL (ref 30.0–36.0)
MCV: 89 fl (ref 78.0–100.0)
MONOS PCT: 14.9 % — AB (ref 3.0–12.0)
Monocytes Absolute: 0.9 10*3/uL (ref 0.1–1.0)
Neutro Abs: 3.9 10*3/uL (ref 1.4–7.7)
Neutrophils Relative %: 67.7 % (ref 43.0–77.0)
Platelets: 279 10*3/uL (ref 150.0–400.0)
RBC: 3.75 Mil/uL — AB (ref 4.22–5.81)
RDW: 13.8 % (ref 11.5–15.5)
WBC: 5.8 10*3/uL (ref 4.0–10.5)

## 2016-12-16 LAB — VITAMIN B12: Vitamin B-12: 512 pg/mL (ref 211–911)

## 2016-12-17 ENCOUNTER — Ambulatory Visit
Admission: RE | Admit: 2016-12-17 | Discharge: 2016-12-17 | Disposition: A | Discharge status: Home / Self Care | Payer: Medicare Other | Source: Ambulatory Visit | Attending: Radiation Oncology | Admitting: Radiation Oncology

## 2016-12-17 DIAGNOSIS — C342 Malignant neoplasm of middle lobe, bronchus or lung: Secondary | ICD-10-CM | POA: Diagnosis not present

## 2016-12-17 DIAGNOSIS — Z510 Encounter for antineoplastic radiation therapy: Secondary | ICD-10-CM | POA: Diagnosis not present

## 2016-12-17 DIAGNOSIS — N400 Enlarged prostate without lower urinary tract symptoms: Secondary | ICD-10-CM | POA: Diagnosis not present

## 2016-12-17 DIAGNOSIS — K219 Gastro-esophageal reflux disease without esophagitis: Secondary | ICD-10-CM | POA: Diagnosis not present

## 2016-12-17 DIAGNOSIS — J449 Chronic obstructive pulmonary disease, unspecified: Secondary | ICD-10-CM | POA: Diagnosis not present

## 2016-12-17 DIAGNOSIS — R51 Headache: Secondary | ICD-10-CM | POA: Diagnosis not present

## 2016-12-20 ENCOUNTER — Ambulatory Visit
Admission: RE | Admit: 2016-12-20 | Discharge: 2016-12-20 | Disposition: A | Discharge status: Home / Self Care | Payer: Medicare Other | Source: Ambulatory Visit | Attending: Radiation Oncology | Admitting: Radiation Oncology

## 2016-12-20 DIAGNOSIS — R51 Headache: Secondary | ICD-10-CM | POA: Diagnosis not present

## 2016-12-20 DIAGNOSIS — C342 Malignant neoplasm of middle lobe, bronchus or lung: Secondary | ICD-10-CM | POA: Diagnosis not present

## 2016-12-20 DIAGNOSIS — N400 Enlarged prostate without lower urinary tract symptoms: Secondary | ICD-10-CM | POA: Diagnosis not present

## 2016-12-20 DIAGNOSIS — K219 Gastro-esophageal reflux disease without esophagitis: Secondary | ICD-10-CM | POA: Diagnosis not present

## 2016-12-20 DIAGNOSIS — Z510 Encounter for antineoplastic radiation therapy: Secondary | ICD-10-CM | POA: Diagnosis not present

## 2016-12-20 DIAGNOSIS — J449 Chronic obstructive pulmonary disease, unspecified: Secondary | ICD-10-CM | POA: Diagnosis not present

## 2016-12-21 ENCOUNTER — Ambulatory Visit: Admission: RE | Admit: 2016-12-21 | Payer: Medicare Other | Source: Ambulatory Visit

## 2016-12-21 ENCOUNTER — Encounter: Payer: Medicare Other | Admitting: Thoracic Surgery (Cardiothoracic Vascular Surgery)

## 2016-12-21 ENCOUNTER — Telehealth: Payer: Self-pay | Admitting: Radiation Oncology

## 2016-12-21 ENCOUNTER — Encounter: Payer: Self-pay | Admitting: *Deleted

## 2016-12-21 DIAGNOSIS — C342 Malignant neoplasm of middle lobe, bronchus or lung: Secondary | ICD-10-CM

## 2016-12-21 DIAGNOSIS — G309 Alzheimer's disease, unspecified: Secondary | ICD-10-CM

## 2016-12-21 DIAGNOSIS — R410 Disorientation, unspecified: Secondary | ICD-10-CM

## 2016-12-21 NOTE — Progress Notes (Signed)
1540 Returned call to Raymond Logan inference to her husband not wanting to receive any more radiation treatments.  He "states the radiation is killing him". and does not give any symptoms the radiation it is causing, just that he is not going to take any more. Raymond Logan said she tried to call Dr. [name] and has not been able to reach him.  I assured them both that I would pass this information on to Dr. [name] and his P.A. Shona Simpson.  Dhared the mentioned information with Dr. [name] and Shona Simpson, P.A. Shona Simpson, P.A. to call Raymond Logan.

## 2016-12-22 ENCOUNTER — Ambulatory Visit (HOSPITAL_COMMUNITY)
Admission: RE | Admit: 2016-12-22 | Discharge: 2016-12-22 | Disposition: A | Discharge status: Home / Self Care | Payer: Medicare Other | Source: Ambulatory Visit | Attending: Radiation Oncology | Admitting: Radiation Oncology

## 2016-12-22 ENCOUNTER — Telehealth: Payer: Self-pay | Admitting: *Deleted

## 2016-12-22 ENCOUNTER — Ambulatory Visit
Admission: RE | Admit: 2016-12-22 | Discharge: 2016-12-22 | Disposition: A | Discharge status: Home / Self Care | Payer: Medicare Other | Source: Ambulatory Visit | Attending: Radiation Oncology | Admitting: Radiation Oncology

## 2016-12-22 ENCOUNTER — Inpatient Hospital Stay
Admission: RE | Admit: 2016-12-22 | Discharge: 2016-12-22 | Disposition: A | Discharge status: Home / Self Care | Payer: Medicare Other | Source: Ambulatory Visit | Attending: Radiation Oncology | Admitting: Radiation Oncology

## 2016-12-22 ENCOUNTER — Encounter: Payer: Self-pay | Admitting: Radiation Oncology

## 2016-12-22 VITALS — BP 91/68 | HR 100 | Temp 97.6°F | Resp 18 | Ht 69.0 in | Wt 144.8 lb

## 2016-12-22 DIAGNOSIS — J323 Chronic sphenoidal sinusitis: Secondary | ICD-10-CM | POA: Diagnosis not present

## 2016-12-22 DIAGNOSIS — J449 Chronic obstructive pulmonary disease, unspecified: Secondary | ICD-10-CM | POA: Diagnosis not present

## 2016-12-22 DIAGNOSIS — R410 Disorientation, unspecified: Secondary | ICD-10-CM | POA: Diagnosis present

## 2016-12-22 DIAGNOSIS — R413 Other amnesia: Secondary | ICD-10-CM

## 2016-12-22 DIAGNOSIS — G309 Alzheimer's disease, unspecified: Secondary | ICD-10-CM | POA: Diagnosis not present

## 2016-12-22 DIAGNOSIS — C342 Malignant neoplasm of middle lobe, bronchus or lung: Secondary | ICD-10-CM

## 2016-12-22 DIAGNOSIS — R51 Headache: Secondary | ICD-10-CM | POA: Diagnosis not present

## 2016-12-22 DIAGNOSIS — K219 Gastro-esophageal reflux disease without esophagitis: Secondary | ICD-10-CM | POA: Diagnosis not present

## 2016-12-22 DIAGNOSIS — N400 Enlarged prostate without lower urinary tract symptoms: Secondary | ICD-10-CM | POA: Diagnosis not present

## 2016-12-22 DIAGNOSIS — Z510 Encounter for antineoplastic radiation therapy: Secondary | ICD-10-CM | POA: Diagnosis not present

## 2016-12-22 MED ORDER — GADOBENATE DIMEGLUMINE 529 MG/ML IV SOLN
15.0000 mL | Freq: Once | INTRAVENOUS | Status: AC | PRN
  Administered 2016-12-22: 14 mL via INTRAVENOUS

## 2016-12-22 NOTE — Telephone Encounter (Signed)
I spoke with the patient and his daughter today about our concerns about stopping treatment and with progressive confusion episodes we should proceed with MRI brain to rule out metastatic disease.

## 2016-12-22 NOTE — Telephone Encounter (Signed)
CALLED PATIENT TO INFORM OF FU WITH ALISON PERKINS ON 12-22-16 @ 1 PM, SPOKE WITH PATIENT AND HE IS AWARE OF THIS APPT.

## 2016-12-22 NOTE — Progress Notes (Signed)
Radiation Oncology         [phone] ________________________________  Name: Raymond Logan MRN: [id number]  Date of Service: 12/22/2016 DOB: 11-11-1930  Post Treatment Note  CC: Biagio Borg, MD  [name], *  Diagnosis:   Stage IB, pT1cN0, NSCLC, adenocarcinoma of the right middle lobe of the lung  Interval Since Last Radiation:  Two days ago   11/18/16-present: 46 Gy out of 60 Gy delivered to the right middle lobe.   Narrative:  The patient returns today for discussion regarding continuing radiotherapy. In summary he was found to have a positive margin following surgical resection for Stage IB NSCLC. He was counseled on the role of 30 fractions of radiotherapy. He decided to stop because of increases in mental fogginess and poor appetite. He has a history of headaches and has seen Dr. Domingo Cocking in Neurology previously but has managed headaches with Gabapentin. He's been forgetful about several medications he usually takes, and has had a hard time recalling what he has or has not taken. He denies any swallowing difficulties or changes in his skin, nor any pain. Yesterday by phone we reviewed his symptoms and recommended a stat MRI of the brain to rule out metastatic disease. He's had some notable word finding difficulties as well. He's scheduled for an MRI at 7 pm this evening. No other complaints are noted.  ALLERGIES:  is allergic to alfuzosin; aricept [donepezil hcl]; and penicillins.  Meds: Current Outpatient Prescriptions  Medication Sig Dispense Refill  . aspirin-acetaminophen-caffeine (EXCEDRIN MIGRAINE) 250-250-65 MG tablet Take 2 tablets by mouth daily as needed for headache or migraine.    . dronabinol (MARINOL) 2.5 MG capsule Take 1 capsule (2.5 mg total) by mouth 2 (two) times daily before a meal. 30 capsule 1  . finasteride (PROSCAR) 5 MG tablet Take 5 mg by mouth at bedtime.     . gabapentin (NEURONTIN) 600 MG tablet Take 600 mg by mouth 2 (two) times daily  as needed (for pain (typically once daily in the morning)).   0  . zolpidem (AMBIEN) 5 MG tablet Take 1 tablet (5 mg total) by mouth at bedtime as needed for sleep. 30 tablet 5  . acetaminophen (TYLENOL) 500 MG tablet Take 2 tablets (1,000 mg total) by mouth every 6 (six) hours as needed. (Patient not taking: Reported on 12/22/2016) 30 tablet 0  . calcium carbonate (TUMS - DOSED IN MG ELEMENTAL CALCIUM) 500 MG chewable tablet Chew 1 tablet by mouth 3 (three) times daily as needed (for acid reflux.).    [name] pantoprazole (PROTONIX) 40 MG tablet Take 1 tablet (40 mg total) by mouth daily. (Patient not taking: Reported on 12/22/2016) 30 tablet 3  . silodosin (RAPAFLO) 8 MG CAPS capsule Take 8 mg by mouth at bedtime.     . traMADol (ULTRAM) 50 MG tablet Take 1 tablet (50 mg total) by mouth every 8 (eight) hours as needed. (Patient not taking: Reported on 12/22/2016) 120 tablet 2   No current facility-administered medications for this encounter.     Physical Findings:  height is 5\' 9"  (1.753 m) and weight is 144 lb 12.8 oz (65.7 kg). His oral temperature is 97.6 F (36.4 C). His blood pressure is 91/68 and his pulse is 100. His respiration is 18 and oxygen saturation is 99%.  Pain Assessment Pain Score:  (Left arm standing)/10 In general this is a well appearing elderly caucasian male in no acute distress. He's alert and oriented x4 and appropriate throughout  the examination. Cardiopulmonary assessment is negative for acute distress and he exhibits normal effort. He appears to be grossly intact neurologically.  Lab Findings: Lab Results  Component Value Date   WBC 5.8 12/15/2016   HGB 11.0 (L) 12/15/2016   HCT 33.4 (L) 12/15/2016   MCV 89.0 12/15/2016   PLT 279.0 12/15/2016     Radiographic Findings: Dg Chest 2 View  Result Date: 12/14/2016 CLINICAL DATA:  83 year old male with history of hydropneumothorax. Followup study. EXAM: CHEST  2 VIEW COMPARISON:  Chest x-ray 12/07/2016. FINDINGS:  Persistent right-sided hydropneumothorax with large pneumothorax component increasing, now at least 60% of the volume of the right hemithorax. Decreasing volume of right-sided pleural fluid, now small. Left lung is clear. Trace left pleural effusion. No evidence of pulmonary edema. Heart size is normal. Postoperative changes of right middle lobectomy. Heart size is normal. Upper mediastinal contours are unremarkable. Aortic atherosclerosis. IMPRESSION: 1. Large right hydropneumothorax with increasing pneumothorax component, now at least 60% of the volume of the right hemithorax. 2. Trace left pleural effusion. 3. Aortic atherosclerosis. These results will be called to the ordering clinician or representative by the Radiologist Assistant, and communication documented in the PACS or zVision Dashboard. Electronically Signed   By: Vinnie Langton M.D.   On: 12/14/2016 12:07   Dg Chest 2 View  Result Date: 12/07/2016 CLINICAL DATA:  Hydropneumothorax. EXAM: CHEST  2 VIEW COMPARISON:  Two-view chest x-rays 12/02/2016 and 11/30/2016 FINDINGS: The heart size is normal. The fluid component of a right-sided hydropneumothorax the is increasing. The pneumothorax is unchanged. The left lung is clear. IMPRESSION: 1. Increasing a loculated fluid collection within the right hemithorax. 2. Stable air component of right-sided pneumothorax. Electronically Signed   By: San Morelle M.D.   On: 12/07/2016 15:06   Dg Chest 2 View  Result Date: 12/02/2016 CLINICAL DATA:  Recent thoracentesis EXAM: CHEST  2 VIEW COMPARISON:  11/30/2016 FINDINGS: The right hydropneumothorax is again identified. The fluid component has increased. The pneumothorax component has diminished. Overall aeration of the right lung is stable. Left lung is clear. Normal heart size. IMPRESSION: Right hydropneumothorax fluid component has increased and pneumothorax component has diminished. Right lung aeration is stable. Electronically Signed   By:  Marybelle Killings M.D.   On: 12/02/2016 14:56   Dg Chest 2 View  Result Date: 11/30/2016 CLINICAL DATA:  Status post right thoracentesis EXAM: CHEST  2 VIEW COMPARISON:  Film from earlier in the same day FINDINGS: There is been interval right thoracentesis performed. The previous space occupied by fluid is now occupied by air in a small residual pleural effusion. This likely represents incomplete re-expansion of the right lung following thoracentesis as opposed to a true pneumothorax. Postsurgical changes are again noted on the right. The left lung remains clear. IMPRESSION: Interval thoracentesis on the right although there is incomplete re-expansion of the right lung. This creates a hydropneumothorax although not felt to be related to lung damage. These results will be called to the ordering clinician or representative by the Radiologist Assistant, and communication documented in the PACS or zVision Dashboard. Electronically Signed   By: Inez Catalina M.D.   On: 11/30/2016 11:09   Dg Chest 2 View  Result Date: 11/30/2016 CLINICAL DATA:  Lung cancer.  Shortness of breath. EXAM: CHEST  2 VIEW COMPARISON:  10/26/2016 FINDINGS: Interval progression of right pleural effusion now moderate in size. Right hilar fullness with architectural distortion in the right upper lung is similar to prior. Left lung remains  clear with tiny left pleural effusion. The cardiopericardial silhouette is within normal limits for size. The visualized bony structures of the thorax are intact. IMPRESSION: Interval progression of right pleural effusion now moderate in size. Electronically Signed   By: Misty Stanley M.D.   On: 11/30/2016 09:51   Dg Chest 2v Repeat Same Day  Result Date: 12/02/2016 CLINICAL DATA:  Status post thoracentesis history of VATS EXAM: CHEST  2 VIEW COMPARISON:  12/02/2016, 11/30/2016, 10/26/2016 FINDINGS: Moderate residual right hydropneumothorax with fluid level at the right base. Decreased air within the pleural  space since the prior radiograph. Left lung is clear. Normal cardiomediastinal silhouette with atherosclerosis. Surgical clips in the right humeral head IMPRESSION: 1. Moderate right hydropneumothorax but with decreased fluid and pleural air since the most recent prior radiograph. 2. Left lung is clear Electronically Signed   By: Donavan Foil M.D.   On: 12/02/2016 16:27    Impression/Plan: 1. Stage IB, pT1cN0, NSCLC, adenocarcinoma of the right middle lobe of the lung. The patient is counseled on continuation of radiotherapy, symptoms do not appear to be directly related to his treatment. The one exception T, however this is difficult to discern because of the polypharmacy at home.  2. Confusion, word finding difficulties, history of headaches the patient has previously see neurology, and based on the findings from his MRI scan which we have recommended, we will determine if he needs to return to neurology evaluation. He and his family are agreement. We also discussed the importance of the palliative care referral to help with polypharmacy as well as discussing this with his heart primary care doctor Dr. Jenny Reichmann.    Carola Rhine, PAC

## 2016-12-22 NOTE — Progress Notes (Addendum)
Raymond Logan 83 y.o. man with  malignant neoplasm of middle lobe of right lung ,refusing his radiation,FU.        Weight changes, if any: Wt Readings from Last 3 Encounters:  12/22/16 144 lb 12.8 oz (65.7 kg)  12/15/16 150 lb (68 kg)  12/14/16 147 lb (66.7 kg)         Respiratory complaints, if any: Denies SOB,coughing or wheezing.       Hemoptysis, if any: No        Swallowing Problems/Pain/Difficulty swallowing:No problems swallowing eating or drinking.       Appetite :Fair       Pain:None       When is next chemo scheduled?:No      Imaging:12-22-16 MRI scheduled this evening, Given card for MRI appointment for this evening at 1900  To arrive at [place] go to [facility],12-14-16 CXR      Lab work from of chart:12-15-16 Bmet,CBC w diff      Reports he has been forgetful lately, alert and oriented x3 this afternoon.      Reoprts he fell around 11:00 a.m. Hit his head on the floor not bruising of scalp noted.       11-04-16 Saw Dr. Julien Nordmann I had a lengthy discussion with the patient and his wife today about his current disease is stage, prognosis and treatment options.       I personally and independently reviewed his previous record, pathology reports as well as imaging studies.       I explained to the patient that there is no survival benefit for adjuvant systemic chemotherapy for patient with a stage IA non-small cell lung cancer and the current standard of care is observation and close monitoring.          I will arrange for the patient to come back for follow-up visit in 6 months for evaluation and repeat CT scan of the chest for restaging of his disease.     BP 103/70   Pulse 86   Temp 97.6 F (36.4 C) (Oral)   Resp 18   Ht 5\' 9"  (1.753 m)   Wt 144 lb 12.8 oz (65.7 kg)   SpO2 99%   BMI 21.38 kg/m  Left arm sitting     BP 91/68   Pulse 100   Temp 97.6 F (36.4 C) (Oral)   Resp 18   Ht 5\' 9"  (1.753 m)   Wt 144 lb 12.8 oz (65.7 kg)   SpO2 99%   BMI 21.38 kg/m

## 2016-12-22 NOTE — Addendum Note (Signed)
Encounter addended by: Malena Edman, RN on: 12/22/2016  4:15 PM<BR>    Actions taken: Charge Capture section accepted

## 2016-12-23 ENCOUNTER — Telehealth: Payer: Self-pay | Admitting: Radiation Oncology

## 2016-12-23 ENCOUNTER — Telehealth: Payer: Self-pay | Admitting: *Deleted

## 2016-12-23 ENCOUNTER — Other Ambulatory Visit: Payer: Self-pay | Admitting: Radiation Oncology

## 2016-12-23 ENCOUNTER — Ambulatory Visit
Admission: RE | Admit: 2016-12-23 | Discharge: 2016-12-23 | Disposition: A | Discharge status: Home / Self Care | Payer: Medicare Other | Source: Ambulatory Visit | Attending: Radiation Oncology | Admitting: Radiation Oncology

## 2016-12-23 DIAGNOSIS — C342 Malignant neoplasm of middle lobe, bronchus or lung: Secondary | ICD-10-CM

## 2016-12-23 DIAGNOSIS — Z510 Encounter for antineoplastic radiation therapy: Secondary | ICD-10-CM | POA: Diagnosis not present

## 2016-12-23 DIAGNOSIS — R51 Headache: Secondary | ICD-10-CM | POA: Diagnosis not present

## 2016-12-23 DIAGNOSIS — J449 Chronic obstructive pulmonary disease, unspecified: Secondary | ICD-10-CM | POA: Diagnosis not present

## 2016-12-23 DIAGNOSIS — K219 Gastro-esophageal reflux disease without esophagitis: Secondary | ICD-10-CM | POA: Diagnosis not present

## 2016-12-23 DIAGNOSIS — R410 Disorientation, unspecified: Secondary | ICD-10-CM

## 2016-12-23 DIAGNOSIS — N400 Enlarged prostate without lower urinary tract symptoms: Secondary | ICD-10-CM | POA: Diagnosis not present

## 2016-12-23 NOTE — Telephone Encounter (Signed)
Spoke with the patient's daughter [name] to review the findings from his MRI and recommendations for meeting back with his neurologist Dr. Domingo Cocking and that we had placed orders for palliative medicine to help aid in assessing him for polypharmacy issues.

## 2016-12-23 NOTE — Telephone Encounter (Signed)
Raymond Logan from Dr. Domingo Cocking, [facility], called stating they  received a referral request for patient to be seen, for progressive dementia?, however, Dr. Domingo Cocking doesn't see patient's with dementia only  For migraine and headaches, his office is not able to see this patient dfor dementia and they ar booked up, can call Raymond Logan for any questions at [phone]

## 2016-12-23 NOTE — Telephone Encounter (Signed)
I sent new orders in for referral to [facility]. I asked shirley to let the family know too

## 2016-12-23 NOTE — Progress Notes (Signed)
The patient's neurologist does not see patients for neurologic conditions other than headaches. We will get him in for formal neurology assessment at [facility].

## 2016-12-24 ENCOUNTER — Encounter: Payer: Self-pay | Admitting: *Deleted

## 2016-12-24 ENCOUNTER — Telehealth: Payer: Self-pay | Admitting: *Deleted

## 2016-12-24 ENCOUNTER — Ambulatory Visit
Admission: RE | Admit: 2016-12-24 | Discharge: 2016-12-24 | Disposition: A | Discharge status: Home / Self Care | Payer: Medicare Other | Source: Ambulatory Visit | Attending: Radiation Oncology | Admitting: Radiation Oncology

## 2016-12-24 DIAGNOSIS — Z510 Encounter for antineoplastic radiation therapy: Secondary | ICD-10-CM | POA: Diagnosis not present

## 2016-12-24 DIAGNOSIS — J449 Chronic obstructive pulmonary disease, unspecified: Secondary | ICD-10-CM | POA: Diagnosis not present

## 2016-12-24 DIAGNOSIS — R413 Other amnesia: Secondary | ICD-10-CM | POA: Diagnosis not present

## 2016-12-24 DIAGNOSIS — R51 Headache: Secondary | ICD-10-CM | POA: Diagnosis not present

## 2016-12-24 DIAGNOSIS — K219 Gastro-esophageal reflux disease without esophagitis: Secondary | ICD-10-CM | POA: Diagnosis not present

## 2016-12-24 DIAGNOSIS — N400 Enlarged prostate without lower urinary tract symptoms: Secondary | ICD-10-CM | POA: Diagnosis not present

## 2016-12-24 DIAGNOSIS — C342 Malignant neoplasm of middle lobe, bronchus or lung: Secondary | ICD-10-CM | POA: Diagnosis not present

## 2016-12-24 NOTE — Progress Notes (Signed)
[facility]  Clinical Social Work was referred by patient's daughter to review and complete healthcare advance directives.  Clinical Social Worker met with patient and daughter in [place] office.  The patient designated spouse, [name], as their primary healthcare agent and daughter, [name], as their secondary agent.  Patient also completed healthcare living will.    Clinical Social Worker notarized documents and made copies for patient/family. Clinical Social Worker will send documents to medical records to be scanned into patient's chart. Clinical Social Worker encouraged patient/family to contact with any additional questions or concerns.  Maryjean Morn, MSW, LCSW Clinical Social Worker [facility] [phone]

## 2016-12-24 NOTE — Telephone Encounter (Signed)
Called patient's wife - [name] to inform that Dr. Domingo Cocking would not see her husband for anything but headaches, therefore he is being referred to [facility], spoke with patient's wife- [name] and she is aware of this .

## 2016-12-27 ENCOUNTER — Ambulatory Visit
Admission: RE | Admit: 2016-12-27 | Discharge: 2016-12-27 | Disposition: A | Discharge status: Home / Self Care | Payer: Medicare Other | Source: Ambulatory Visit | Attending: Radiation Oncology | Admitting: Radiation Oncology

## 2016-12-27 DIAGNOSIS — C342 Malignant neoplasm of middle lobe, bronchus or lung: Secondary | ICD-10-CM | POA: Diagnosis not present

## 2016-12-27 DIAGNOSIS — R51 Headache: Secondary | ICD-10-CM | POA: Diagnosis not present

## 2016-12-27 DIAGNOSIS — K219 Gastro-esophageal reflux disease without esophagitis: Secondary | ICD-10-CM | POA: Diagnosis not present

## 2016-12-27 DIAGNOSIS — J449 Chronic obstructive pulmonary disease, unspecified: Secondary | ICD-10-CM | POA: Diagnosis not present

## 2016-12-27 DIAGNOSIS — Z510 Encounter for antineoplastic radiation therapy: Secondary | ICD-10-CM | POA: Diagnosis not present

## 2016-12-27 DIAGNOSIS — N400 Enlarged prostate without lower urinary tract symptoms: Secondary | ICD-10-CM | POA: Diagnosis not present

## 2016-12-28 ENCOUNTER — Ambulatory Visit: Payer: Medicare Other

## 2016-12-28 ENCOUNTER — Ambulatory Visit
Admission: RE | Admit: 2016-12-28 | Discharge: 2016-12-28 | Disposition: A | Discharge status: Home / Self Care | Payer: Medicare Other | Source: Ambulatory Visit | Attending: Radiation Oncology | Admitting: Radiation Oncology

## 2016-12-28 DIAGNOSIS — Z510 Encounter for antineoplastic radiation therapy: Secondary | ICD-10-CM | POA: Diagnosis not present

## 2016-12-28 DIAGNOSIS — J449 Chronic obstructive pulmonary disease, unspecified: Secondary | ICD-10-CM | POA: Diagnosis not present

## 2016-12-28 DIAGNOSIS — R51 Headache: Secondary | ICD-10-CM | POA: Diagnosis not present

## 2016-12-28 DIAGNOSIS — C342 Malignant neoplasm of middle lobe, bronchus or lung: Secondary | ICD-10-CM | POA: Diagnosis not present

## 2016-12-28 DIAGNOSIS — N400 Enlarged prostate without lower urinary tract symptoms: Secondary | ICD-10-CM | POA: Diagnosis not present

## 2016-12-28 DIAGNOSIS — K219 Gastro-esophageal reflux disease without esophagitis: Secondary | ICD-10-CM | POA: Diagnosis not present

## 2016-12-28 NOTE — Progress Notes (Signed)
Nutrition Assessment   Reason for Assessment:   Weight loss, family concern about nutrition  ASSESSMENT:  83 year old male with adenocarcinoma of lung, currently receiving radiation (3 more treatments left).  Past medical history of afib, COPD, PUD, GERD, HLD  Met with patient, wife and daughter today.  Daughter concerned about father's weight loss and memory changes. Patient reports that he has loss of appetite.  Daughter feels like he may not feel like eating or just fixes things that are easy to prepare (ie cereal and milk, sandwich).  Daughter feels that he needs to be reminded to eat as well.  Patient reports typically eats oatmeal with walnuts and blueberries and water for breakfast, then may have ensure/boost for lunch with meat sandwich and sometimes fruit.  In the afternoon may have a candy bar or boost/ensure.  Evening meal is sometimes pasta with meatballs, sausage.  Last night daughter prepared new potatoes, green beans, chicken with cheese and tomatoes and salad with walnuts and apples.  Patient typically drinks almond milk.    No report of nausea, constipation, some loose stool but no diarrhea, no trouble chewing or swallowing  Nutrition Focused Physical Exam: deferred  Medications: marinol (started recently), calcium carbonate, protonix  Labs: reviewed  Anthropometrics:   Height: 69 inches Weight: 144 lb Noted weight of 154 lb in August 2018 BMI: 21  6% weight loss in the last 2 months   Estimated Energy Needs  Kcals: 1610-9604 calories/d Protein: 98-113 g/d Fluid: 2.2 L/d  NUTRITION DIAGNOSIS: Unintentional weight loss related to cancer and cancer related treatment, memory changes as evidenced by 6% weight loss in 2 months   MALNUTRITION DIAGNOSIS: continue to monitor   INTERVENTION:   Discussed strategies to increase calories and protein to promote weight gain.  Fact sheet provided Encouraged small frequent meals Discussed oral nutrition supplements and  provided samples of ensure enlive, boost plus along with coupons.  Provided recipes of nutrition shakes to try with added calories and protein.     MONITORING, EVALUATION, GOAL: Patient will consume adequate calories and protein to maintain weight   NEXT VISIT: Daughter to contact me with questions or further appointment  Nazli Penn B. Zenia Resides, [place], [place] Registered Dietitian [phone] (pager)

## 2016-12-29 ENCOUNTER — Ambulatory Visit: Payer: Medicare Other

## 2016-12-29 ENCOUNTER — Ambulatory Visit
Admission: RE | Admit: 2016-12-29 | Discharge: 2016-12-29 | Disposition: A | Discharge status: Home / Self Care | Payer: Medicare Other | Source: Ambulatory Visit | Attending: Radiation Oncology | Admitting: Radiation Oncology

## 2016-12-29 DIAGNOSIS — N400 Enlarged prostate without lower urinary tract symptoms: Secondary | ICD-10-CM | POA: Diagnosis not present

## 2016-12-29 DIAGNOSIS — C342 Malignant neoplasm of middle lobe, bronchus or lung: Secondary | ICD-10-CM | POA: Diagnosis not present

## 2016-12-29 DIAGNOSIS — K219 Gastro-esophageal reflux disease without esophagitis: Secondary | ICD-10-CM | POA: Diagnosis not present

## 2016-12-29 DIAGNOSIS — R51 Headache: Secondary | ICD-10-CM | POA: Diagnosis not present

## 2016-12-29 DIAGNOSIS — Z510 Encounter for antineoplastic radiation therapy: Secondary | ICD-10-CM | POA: Diagnosis not present

## 2016-12-29 DIAGNOSIS — J449 Chronic obstructive pulmonary disease, unspecified: Secondary | ICD-10-CM | POA: Diagnosis not present

## 2016-12-30 ENCOUNTER — Encounter: Payer: Self-pay | Admitting: Radiation Oncology

## 2016-12-30 ENCOUNTER — Ambulatory Visit
Admission: RE | Admit: 2016-12-30 | Discharge: 2016-12-30 | Disposition: A | Discharge status: Home / Self Care | Payer: Medicare Other | Source: Ambulatory Visit | Attending: Radiation Oncology | Admitting: Radiation Oncology

## 2016-12-30 DIAGNOSIS — R51 Headache: Secondary | ICD-10-CM | POA: Diagnosis not present

## 2016-12-30 DIAGNOSIS — C342 Malignant neoplasm of middle lobe, bronchus or lung: Secondary | ICD-10-CM | POA: Diagnosis not present

## 2016-12-30 DIAGNOSIS — J449 Chronic obstructive pulmonary disease, unspecified: Secondary | ICD-10-CM | POA: Diagnosis not present

## 2016-12-30 DIAGNOSIS — N400 Enlarged prostate without lower urinary tract symptoms: Secondary | ICD-10-CM | POA: Diagnosis not present

## 2016-12-30 DIAGNOSIS — Z510 Encounter for antineoplastic radiation therapy: Secondary | ICD-10-CM | POA: Diagnosis not present

## 2016-12-30 DIAGNOSIS — K219 Gastro-esophageal reflux disease without esophagitis: Secondary | ICD-10-CM | POA: Diagnosis not present

## 2017-01-03 ENCOUNTER — Other Ambulatory Visit: Payer: Self-pay | Admitting: Thoracic Surgery (Cardiothoracic Vascular Surgery)

## 2017-01-03 DIAGNOSIS — C3490 Malignant neoplasm of unspecified part of unspecified bronchus or lung: Secondary | ICD-10-CM

## 2017-01-04 ENCOUNTER — Encounter: Payer: Self-pay | Admitting: *Deleted

## 2017-01-04 ENCOUNTER — Ambulatory Visit
Admission: RE | Admit: 2017-01-04 | Discharge: 2017-01-04 | Disposition: A | Discharge status: Home / Self Care | Payer: Medicare Other | Source: Ambulatory Visit | Attending: Thoracic Surgery (Cardiothoracic Vascular Surgery) | Admitting: Thoracic Surgery (Cardiothoracic Vascular Surgery)

## 2017-01-04 ENCOUNTER — Ambulatory Visit (INDEPENDENT_AMBULATORY_CARE_PROVIDER_SITE_OTHER): Payer: Self-pay | Admitting: Thoracic Surgery (Cardiothoracic Vascular Surgery)

## 2017-01-04 ENCOUNTER — Encounter: Payer: Self-pay | Admitting: Thoracic Surgery (Cardiothoracic Vascular Surgery)

## 2017-01-04 ENCOUNTER — Ambulatory Visit: Payer: Medicare Other | Admitting: Thoracic Surgery (Cardiothoracic Vascular Surgery)

## 2017-01-04 ENCOUNTER — Other Ambulatory Visit: Payer: Self-pay

## 2017-01-04 VITALS — BP 115/75 | HR 97 | Resp 16 | Ht 69.0 in | Wt 143.0 lb

## 2017-01-04 DIAGNOSIS — C3491 Malignant neoplasm of unspecified part of right bronchus or lung: Secondary | ICD-10-CM

## 2017-01-04 DIAGNOSIS — C3490 Malignant neoplasm of unspecified part of unspecified bronchus or lung: Secondary | ICD-10-CM | POA: Diagnosis not present

## 2017-01-04 DIAGNOSIS — J948 Other specified pleural conditions: Secondary | ICD-10-CM

## 2017-01-04 DIAGNOSIS — Z902 Acquired absence of lung [part of]: Secondary | ICD-10-CM

## 2017-01-04 DIAGNOSIS — J90 Pleural effusion, not elsewhere classified: Secondary | ICD-10-CM

## 2017-01-04 NOTE — Progress Notes (Signed)
[address]       [place] [address]             [phone]     HPI: Raymond Logan returns for a scheduled visit  He is an 83 year old man who has thoracoscopic right middle lobectomy on August 9.  He had an adenocarcinoma.  He had very close margins and received adjuvant radiation.  He completed that last Thursday.  He says that since he completed radiation he is beginning to feel much better.  His appetite is a little better but his daughter says that he still has to be encouraged to eat enough.  He does not feel short of breath and is not having any significant pain.  He thinks his "mind is starting to clear up."  He developed a pleural effusion in late September.  Thoracentesis resulted in a pneumo ex vacuo and he has some degree of a trapped lung.  Repeated thoracenteses helped with symptoms.  He says that over the past couple of weeks his breathing has improved.  Past Medical History:  Diagnosis Date  . ALLERGIC RHINITIS 01/23/2007  . Arthritis    hands  . BENIGN PROSTATIC HYPERTROPHY 09/10/2006  . Chronic headaches   . Complication of anesthesia    difficulty voiding- if cath needed needs to placed with need to be done with scope  . COPD, MILD 04/03/2010   patient denies on preop visit of 08/02/2014   . Difficulty in urination   . FATIGUE 01/08/2008  . GERD 04/15/2010  . H. pylori infection    hx of   . Headache(784.0) 09/10/2006  . HOARSENESS 04/15/2010  . HYPERLIPIDEMIA 09/10/2006  . INSOMNIA-SLEEP DISORDER-UNSPEC 04/24/2009  . PEPTIC ULCER DISEASE 01/23/2007  . Pneumonia    1955     Current Outpatient Medications  Medication Sig Dispense Refill  . acetaminophen (TYLENOL) 500 MG tablet Take 2 tablets (1,000 mg total) by mouth every 6 (six) hours as needed. 30 tablet 0  . aspirin-acetaminophen-caffeine (EXCEDRIN MIGRAINE) 250-250-65 MG tablet Take 2 tablets by mouth daily as needed for headache or migraine.    . calcium carbonate (TUMS - DOSED IN MG  ELEMENTAL CALCIUM) 500 MG chewable tablet Chew 1 tablet by mouth 3 (three) times daily as needed (for acid reflux.).    [name] finasteride (PROSCAR) 5 MG tablet Take 5 mg by mouth at bedtime.     . gabapentin (NEURONTIN) 600 MG tablet Take 600 mg by mouth 2 (two) times daily as needed (for pain (typically once daily in the morning)).   0  . silodosin (RAPAFLO) 8 MG CAPS capsule Take 8 mg by mouth at bedtime.     [name] zolpidem (AMBIEN) 5 MG tablet Take 1 tablet (5 mg total) by mouth at bedtime as needed for sleep. 30 tablet 5  . dronabinol (MARINOL) 2.5 MG capsule Take 1 capsule (2.5 mg total) by mouth 2 (two) times daily before a meal. (Patient not taking: Reported on 01/04/2017) 30 capsule 1  . pantoprazole (PROTONIX) 40 MG tablet Take 1 tablet (40 mg total) by mouth daily. (Patient not taking: Reported on 12/22/2016) 30 tablet 3  . traMADol (ULTRAM) 50 MG tablet Take 1 tablet (50 mg total) by mouth every 8 (eight) hours as needed. (Patient not taking: Reported on 12/22/2016) 120 tablet 2  . traMADol (ULTRAM) 50 MG tablet Take every 8 (eight) hours as needed by mouth.     No current facility-administered medications for this visit.  Physical Exam BP 115/75 (BP Location: Right Arm, Patient Position: Sitting, Cuff Size: Normal)   Pulse 97   Resp 16   Ht 5\' 9"  (1.753 m)   Wt 143 lb (64.9 kg)   SpO2 98% Comment: ON RA  BMI 21.57 kg/m  83 year old man in no acute distress Alert and oriented x3 with no deficits Lungs diminished on right, otherwise clear  Diagnostic Tests: CHEST  2 VIEW  COMPARISON:  12/14/2016  FINDINGS: Two views study shows mild improvement in the right hydropneumothorax with slight further re-expansion of right lung. Right hilar soft tissue fullness is similar to prior. Left lung remains clear. The visualized bony structures of the thorax are intact.  IMPRESSION: Slight interval improvement in right-sided hydropneumothorax.  Stable soft tissue fullness right  hilum.   Electronically Signed   By: Misty Stanley M.D.   On: 01/04/2017 11:31 I personally reviewed the chest x-ray and concur with the findings noted above  Impression: Raymond Logan is an 83 year old man who had a thoracoscopic right middle lobectomy followed by adjuvant radiation therapy for adenocarcinoma of the lung.  He developed a right pleural effusion and then a trapped lung with residual hydropneumothorax.  His x-ray is significantly better today than it was 3 weeks ago.  I do not have a clear reason for that to have occurred.  No intervention is indicated at this time as he is essentially asymptomatic from that.  He has a follow-up in mid December with radiation oncology.  Plan: I will plan to see him back in 2 months with a chest x-ray to check on his progress.  Raymond Nakayama, MD [facility] [phone]

## 2017-01-14 NOTE — Progress Notes (Signed)
  Radiation Oncology         (336) [phone] ________________________________  Name: Raymond Logan MRN: [id number]  Date: 12/30/2016  DOB: 24-Jun-1930  End of Treatment Note  Diagnosis:  Lung cancer     Indication for treatment::  Curative       Radiation treatment dates:   11/18/2016 - 12/30/2016  Site/dose:   The patient was treated to the disease within the right lung to a dose of 60 Gy using a 3 field, 3-D conformal technique.  Narrative: The patient tolerated radiation treatment relatively well.   The patient did not experience esophagitis during the course of treatment which required management. He also denied any fatigue, shortness of breath, cough, nausea, vomiting, or issues with his skin.  Plan: The patient has completed radiation treatment. The patient will return to radiation oncology clinic for routine followup in one month. I advised the patient to call or return sooner if they have any questions or concerns related to their recovery or treatment. ________________________________  ------------------------------------------------  Jodelle Gross, MD, PhD  This document serves as a record of services personally performed by Kyung Rudd, MD. It was created on his behalf by Rae Lips, a trained medical scribe. The creation of this record is based on the scribe's personal observations and the provider's statements to them. This document has been checked and approved by the attending provider.

## 2017-02-09 ENCOUNTER — Other Ambulatory Visit: Payer: Self-pay

## 2017-02-09 ENCOUNTER — Ambulatory Visit
Admission: RE | Admit: 2017-02-09 | Discharge: 2017-02-09 | Disposition: A | Discharge status: Home / Self Care | Payer: Medicare Other | Source: Ambulatory Visit | Attending: Radiation Oncology | Admitting: Radiation Oncology

## 2017-02-09 ENCOUNTER — Encounter: Payer: Self-pay | Admitting: Radiation Oncology

## 2017-02-09 VITALS — BP 89/52 | HR 103 | Temp 98.1°F | Resp 18 | Ht 69.0 in | Wt 156.6 lb

## 2017-02-09 DIAGNOSIS — I959 Hypotension, unspecified: Secondary | ICD-10-CM | POA: Diagnosis not present

## 2017-02-09 DIAGNOSIS — C342 Malignant neoplasm of middle lobe, bronchus or lung: Secondary | ICD-10-CM | POA: Diagnosis not present

## 2017-02-09 DIAGNOSIS — Z923 Personal history of irradiation: Secondary | ICD-10-CM | POA: Insufficient documentation

## 2017-02-09 NOTE — Progress Notes (Signed)
Radiation Oncology         (336) [phone] ________________________________  Name: Raymond Logan MRN: [id number]  Date of Service: 02/09/2017 DOB: [birth year]  Post Treatment Note  CC: Biagio Borg, MD  [name], *  Diagnosis:   Stage IB, pT1cN0, NSCLC, adenocarcinoma of the right middle lobe of the lung  Interval Since Last Radiation: 6 weeks   11/18/2016 - 12/30/2016: The patient was treated to the right middle lobe surgical bed to a dose of 60 Gy in 30 fractions using a 3 field, 3-D conformal technique.   Narrative:  The patient returns today for routine follow-up.  In summary the patient was treated with radiotherapy for a positive margin following surgical resection of a stage I lung cancer. He tolerated radiation with the exception of "mental fogginess." After reviewing his case with his daughter who's a physical therapist, the family had noticed increasing mental confusion and decline. He did undergo a brain MRI to rule out metastatic disease which was negative for metastases on  12/22/16. He is scheduled to meet with Dr. Jannifer Franklin in Neurology in January, and comes today for a post treatment follow up.                              On review of systems, the patient states he's been feeling pretty well. He continues to have knee pain and reports he's hoping to avoid any further surgery this year, but does need a knee replacement per report. He is back playing ping pong, but not running and playing basketball due to his knee pain. He reports he is not feeling mentally foggy any longer, but reports that he continues to have headaches daily. He denies any progressive headaches, or changes in levels of alertness. He denies any confusion. He does not have shortness of breath, chest pain, fevers, chills, cough, or sputum production. No other complaints are noted.  ALLERGIES:  is allergic to alfuzosin; aricept [donepezil hcl]; and penicillins.  Meds: Current Outpatient Medications    Medication Sig Dispense Refill  . calcium carbonate (TUMS - DOSED IN MG ELEMENTAL CALCIUM) 500 MG chewable tablet Chew 1 tablet by mouth 3 (three) times daily as needed (for acid reflux.).    [name] finasteride (PROSCAR) 5 MG tablet Take 5 mg by mouth at bedtime.     . gabapentin (NEURONTIN) 600 MG tablet Take 600 mg by mouth 2 (two) times daily as needed (for pain (typically once daily in the morning)).   0  . silodosin (RAPAFLO) 8 MG CAPS capsule Take 8 mg by mouth at bedtime.     [name] zolpidem (AMBIEN) 5 MG tablet Take 1 tablet (5 mg total) by mouth at bedtime as needed for sleep. 30 tablet 5  . acetaminophen (TYLENOL) 500 MG tablet Take 2 tablets (1,000 mg total) by mouth every 6 (six) hours as needed. (Patient not taking: Reported on 02/09/2017) 30 tablet 0  . aspirin-acetaminophen-caffeine (EXCEDRIN MIGRAINE) 250-250-65 MG tablet Take 2 tablets by mouth daily as needed for headache or migraine.    . dronabinol (MARINOL) 2.5 MG capsule Take 1 capsule (2.5 mg total) by mouth 2 (two) times daily before a meal. (Patient not taking: Reported on 01/04/2017) 30 capsule 1  . pantoprazole (PROTONIX) 40 MG tablet Take 1 tablet (40 mg total) by mouth daily. (Patient not taking: Reported on 12/22/2016) 30 tablet 3  . traMADol (ULTRAM) 50 MG tablet Take 1 tablet (50 mg  total) by mouth every 8 (eight) hours as needed. (Patient not taking: Reported on 12/22/2016) 120 tablet 2   No current facility-administered medications for this encounter.     Physical Findings:  height is 5\' 9"  (1.753 m) and weight is 156 lb 9.6 oz (71 kg). His oral temperature is 98.1 F (36.7 C). His blood pressure is 89/52 (abnormal) and his pulse is 103 (abnormal). His respiration is 18.  Pain Assessment Pain Score: 0-No pain/10 In general this is a well appearing caucasian male in no acute distress. He's alert and oriented x4 and appropriate throughout the examination. Cardiopulmonary assessment is negative for acute distress and he  exhibits normal effort and he has clear breath sounds bilaterally, and regular rate and rhythm, no C/R/M are noted.  Lab Findings: Lab Results  Component Value Date   WBC 5.8 12/15/2016   HGB 11.0 (L) 12/15/2016   HCT 33.4 (L) 12/15/2016   MCV 89.0 12/15/2016   PLT 279.0 12/15/2016     Radiographic Findings: No results found.  Impression/Plan: 1. Stage IB, pT1cN0, NSCLC, adenocarcinoma of the right middle lobe of the lung. The patient is doing well following his radiotherapy treatment. He will follow up with Dr. Roxan Hockey for postop evaluation in January 2019. He will be due for his post treatment surveillance CT scan in March when he sees Dr. Julien Nordmann and Mikey Bussing, NP. We would be happy to follow him as needed moving forward as he will continue with routine CT surveillance with Dr. Julien Nordmann. He is in agreement with this plan. We also reviewed precautions to call our office if he develops symptoms of pneumonitis.  2. Mental "fogginess," history of memory disturbance concerning for dementia and chronic headaches. The patient will proceed with evaluation with Dr. Jannifer Franklin. He appears to be more alert today than at his last visit. Fortunately when he was noting more acute changes, an MRI of the brain was performed and was negative. We will follow this expectantly. 3. Hypotension. I believe this is due to poor oral intake and encouraged him to increase his oral intake of fluids. He is not on any antihypertensives, but does take Flomax at night for urinary frequency/BPH and is counseled on getting up slowly when he gets up to void at night time.      Carola Rhine, PAC

## 2017-02-09 NOTE — Addendum Note (Signed)
Encounter addended by: Malena Edman, RN on: 02/09/2017 4:37 PM  Actions taken: Charge Capture section accepted

## 2017-03-07 ENCOUNTER — Other Ambulatory Visit: Payer: Self-pay | Admitting: Thoracic Surgery (Cardiothoracic Vascular Surgery)

## 2017-03-07 DIAGNOSIS — C342 Malignant neoplasm of middle lobe, bronchus or lung: Secondary | ICD-10-CM

## 2017-03-08 ENCOUNTER — Ambulatory Visit (INDEPENDENT_AMBULATORY_CARE_PROVIDER_SITE_OTHER): Payer: Medicare Other | Admitting: Thoracic Surgery (Cardiothoracic Vascular Surgery)

## 2017-03-08 ENCOUNTER — Ambulatory Visit
Admission: RE | Admit: 2017-03-08 | Discharge: 2017-03-08 | Disposition: A | Discharge status: Home / Self Care | Payer: Medicare Other | Source: Ambulatory Visit | Attending: Thoracic Surgery (Cardiothoracic Vascular Surgery) | Admitting: Thoracic Surgery (Cardiothoracic Vascular Surgery)

## 2017-03-08 VITALS — BP 107/74 | HR 90 | Resp 20 | Ht 69.0 in | Wt 158.0 lb

## 2017-03-08 DIAGNOSIS — C342 Malignant neoplasm of middle lobe, bronchus or lung: Secondary | ICD-10-CM

## 2017-03-08 DIAGNOSIS — Z902 Acquired absence of lung [part of]: Secondary | ICD-10-CM

## 2017-03-08 DIAGNOSIS — C3491 Malignant neoplasm of unspecified part of right bronchus or lung: Secondary | ICD-10-CM

## 2017-03-08 DIAGNOSIS — J90 Pleural effusion, not elsewhere classified: Secondary | ICD-10-CM | POA: Diagnosis not present

## 2017-03-08 NOTE — Progress Notes (Signed)
[address]       [place] [address]             [phone]     HPI: Raymond Logan returns for a scheduled follow-up visit  He is an 84 year old man who had a thoracoscopic right middle lobectomy on 10/07/2016 for a stage IA3 (T1c, N0, M0) adenocarcinoma.  He had a very close vascular margin and underwent adjuvant radiation by Dr. [name].  During the course of radiation he developed a right pleural effusion.  After draining the effusion he developed a ex vacuo.  He also had a poor appetite and significant weight loss in the early postoperative.  I last saw him on 01/04/2017.  He was starting to feel a little better at that time.  He felt like his mind was clearing up and his appetite was a little better.  His x-ray was improving but he still had a second space in the right chest.  Since his last visit he has continued to improve.  He is playing ping-pong on a regular basis.  He says he has to sit down and rest after a couple of games.  He is not back to his preoperative physical status.  He has had a cough over the past few weeks.  It has been nonproductive.  He has not had any fevers or chills.  He has minimal incisional discomfort.  He has gained 9 pounds since his last visit  Past Medical History:  Diagnosis Date  . ALLERGIC RHINITIS 01/23/2007  . Arthritis    hands  . BENIGN PROSTATIC HYPERTROPHY 09/10/2006  . Chronic headaches   . Complication of anesthesia    difficulty voiding- if cath needed needs to placed with need to be done with scope  . COPD, MILD 04/03/2010   patient denies on preop visit of 08/02/2014   . Difficulty in urination   . FATIGUE 01/08/2008  . GERD 04/15/2010  . H. pylori infection    hx of   . Headache(784.0) 09/10/2006  . HOARSENESS 04/15/2010  . HYPERLIPIDEMIA 09/10/2006  . INSOMNIA-SLEEP DISORDER-UNSPEC 04/24/2009  . PEPTIC ULCER DISEASE 01/23/2007  . Pneumonia    1955    Current Outpatient Medications  Medication Sig Dispense Refill    . acetaminophen (TYLENOL) 500 MG tablet Take 2 tablets (1,000 mg total) by mouth every 6 (six) hours as needed. 30 tablet 0  . aspirin-acetaminophen-caffeine (EXCEDRIN MIGRAINE) 250-250-65 MG tablet Take 2 tablets by mouth daily as needed for headache or migraine.    . calcium carbonate (TUMS - DOSED IN MG ELEMENTAL CALCIUM) 500 MG chewable tablet Chew 1 tablet by mouth 3 (three) times daily as needed (for acid reflux.).    [name] finasteride (PROSCAR) 5 MG tablet Take 5 mg by mouth at bedtime.     . gabapentin (NEURONTIN) 600 MG tablet Take 600 mg by mouth 2 (two) times daily as needed (for pain (typically once daily in the morning)).   0  . silodosin (RAPAFLO) 8 MG CAPS capsule Take 8 mg by mouth at bedtime.     . traMADol (ULTRAM) 50 MG tablet Take 1 tablet (50 mg total) by mouth every 8 (eight) hours as needed. 120 tablet 2  . zolpidem (AMBIEN) 5 MG tablet Take 1 tablet (5 mg total) by mouth at bedtime as needed for sleep. 30 tablet 5   No current facility-administered medications for this visit.     Physical Exam BP 107/74   Pulse 90  Resp 20   Ht 5\' 9"  (1.753 m)   Wt 158 lb (71.7 kg)   SpO2 98% Comment: RA  BMI 23.63 kg/m  84 year old man in no acute distress Alert and oriented x3 with no focal deficits Lungs slightly diminished right base, otherwise clear Incisions well-healed  Diagnostic Tests: CHEST  2 VIEW  COMPARISON:  Chest x-ray of January 04, 2017  FINDINGS: The pneumothorax has cleared. There remains a small right pleural effusion. There is increased density in the right hilum and in the right mid lung parenchyma distal to the hilum. The left lung is clear. The heart and pulmonary vascularity are normal. There calcification in the wall of the aortic arch. The trachea is midline. The bony thorax exhibits no acute abnormality.  IMPRESSION: Atelectasis or infiltrate in the inferior aspect of the right upper lobe or superior segment of the right lower lobe  posteriorly which may be post obstructive. Persistent small right pleural effusion.  Thoracic aortic atherosclerosis.   Electronically Signed   By: David  Martinique M.D.   On: 03/08/2017 11:42    I personally reviewed the chest x-ray.  I suspect the findings in the lower portion of the upper lobe and superior segment of the lower lobe are related to radiation effects.  Impression: Raymond Logan is an 84 year old gentleman who had a right middle lobectomy almost 4 months ago for a stage Ia adenocarcinoma.  He was treated with adjuvant radiation for a close vascular margin.  He had developed a pleural effusion and then a ex vacuo after thoracentesis.  Today he looks much better than when I saw him last.  His exercise tolerance is slowly improving.  He has had a cough which I suspect is related to the radiation changes in his right lung.  He does not have any fevers, chills, or sputum production to suggest an infectious source.  He has started gaining weight which is a positive sign.  Plan: Return in 3 months after CT scan and visit with Dr. Thad Ranger, MD [facility] [phone]

## 2017-03-11 ENCOUNTER — Ambulatory Visit (INDEPENDENT_AMBULATORY_CARE_PROVIDER_SITE_OTHER): Payer: Medicare Other | Admitting: Neurology

## 2017-03-11 ENCOUNTER — Encounter: Payer: Self-pay | Admitting: Neurology

## 2017-03-11 VITALS — BP 105/61 | HR 98 | Ht 69.0 in | Wt 159.5 lb

## 2017-03-11 DIAGNOSIS — G3184 Mild cognitive impairment, so stated: Secondary | ICD-10-CM | POA: Diagnosis not present

## 2017-03-11 NOTE — Progress Notes (Signed)
Reason for visit: Memory disorder  Referring physician: Dr. Shon Millet KEKOA Logan is a 84 y.o. male  History of present illness:  [name] is an 84 year old right-handed white male with a history of some mild forgetfulness for several years.  The patient otherwise was functioning fairly well, but in the summer 2018 he was undergoing a workup prior to a total knee replacement on the left.  This included a chest x-ray that picked up a malignancy that required surgical resection in August 2018.  Following surgical resection, the patient underwent radiation therapy.  He claims that during radiation therapy he became confused, foggy headed, with a significant decline in his cognitive functioning.  He was not able to operate a motor vehicle well, he cannot keep up with finances or medications or appointments.  The patient did not get chemotherapy.  The patient underwent a MRI of the brain that was unremarkable.  He has undergone blood work to include a vitamin B12 level that was normal.  He did have a 20 pound weight loss during this phase of treatment, he has gained back about 7 pounds of this.  The patient has completed the radiation therapy and he believes that his memory has improved some, but it has not returned to his baseline.  The patient is still having some increased forgetfulness.  He indicates that he is having insomnia at night.  He will get to sleep around 11:00 PM, and wake up around 2:30 AM and cannot get back to sleep.  Part of the issue is that he has significant urinary frequency, he has to use the bathroom at least once an hour and oftentimes cannot get back to sleep.  He takes Ambien 5 mg at night but this does not allow him to sleep throughout the night.  The patient does have significant fatigue during the day.  He denies issues controlling the bowels with the bladder but he does have urinary urgency.  The patient is sent to this office for an evaluation.  Past Medical History:    Diagnosis Date  . ALLERGIC RHINITIS 01/23/2007  . Arthritis    hands  . BENIGN PROSTATIC HYPERTROPHY 09/10/2006  . Chronic headaches   . Complication of anesthesia    difficulty voiding- if cath needed needs to placed with need to be done with scope  . COPD, MILD 04/03/2010   patient denies on preop visit of 08/02/2014   . Difficulty in urination   . FATIGUE 01/08/2008  . GERD 04/15/2010  . H. pylori infection    hx of   . Headache(784.0) 09/10/2006  . HOARSENESS 04/15/2010  . HYPERLIPIDEMIA 09/10/2006  . INSOMNIA-SLEEP DISORDER-UNSPEC 04/24/2009  . PEPTIC ULCER DISEASE 01/23/2007  . Pneumonia    1955    Past Surgical History:  Procedure Laterality Date  . CATARACT EXTRACTION Left   . COLONOSCOPY    . CYSTOSCOPY N/A 10/07/2016   Procedure: CYSTOSCOPY;  Surgeon: Festus Aloe, MD;  Location: [place];  Service: Urology;  Laterality: N/A;  . HEMORRHOID BANDING    . INSERTION OF SUPRAPUBIC CATHETER N/A 10/07/2016   Procedure: FOLEY  CATHETER PLACEMENT;  Surgeon: Festus Aloe, MD;  Location: [place];  Service: Urology;  Laterality: N/A;  . KNEE ARTHROSCOPY Left    torn meniscus  . LUMBAR LAMINECTOMY/DECOMPRESSION MICRODISCECTOMY Right 08/07/2014   Procedure: complete DECOMPRESSION LUMBAR LAMINECTOMY/MICRODISCECTOMY OF L4-L5 for spinal stenosis, DECOMPRESSION OF L3-L4;  Surgeon: Latanya Maudlin, MD;  Location: [facility];  Service: Orthopedics;  Laterality:  Right;  [name] ROTATOR CUFF REPAIR Right   . SHOULDER ARTHROSCOPY     x3, L & R  . VIDEO ASSISTED THORACOSCOPY (VATS)/ LOBECTOMY Right 10/07/2016   Procedure: VIDEO ASSISTED THORACOSCOPY (VATS)/RIGHT MIDDLE LOBECTOMY;  Surgeon: [name], MD;  Location: [place];  Service: Thoracic;  Laterality: Right;  [name] VIDEO BRONCHOSCOPY WITH ENDOBRONCHIAL NAVIGATION N/A 09/23/2016   Procedure: VIDEO BRONCHOSCOPY WITH ENDOBRONCHIAL NAVIGATION;  Surgeon: [name], MD;  Location: [facility];  Service: Thoracic;  Laterality: N/A;  . VIDEO  BRONCHOSCOPY WITH ENDOBRONCHIAL ULTRASOUND N/A 09/23/2016   Procedure: VIDEO BRONCHOSCOPY WITH ENDOBRONCHIAL ULTRASOUND;  Surgeon: [name], MD;  Location[facility];  Service: Thoracic;  Laterality: N/A;    Family History  Problem Relation Age of Onset  . Prostate cancer Brother   . Alzheimer's disease Mother   . Alzheimer's disease Sister     Social history:  reports that he quit smoking about 41 years ago. He has a 23.00 pack-year smoking history. he has never used smokeless tobacco. He reports that he does not drink alcohol or use drugs.  Medications:  Prior to Admission medications   Medication Sig Start Date End Date Taking? Authorizing Provider  acetaminophen (TYLENOL) 500 MG tablet Take 2 tablets (1,000 mg total) by mouth every 6 (six) hours as needed. 10/12/16  Yes Barrett, Lodema Hong, PA-C  aspirin-acetaminophen-caffeine (EXCEDRIN MIGRAINE) 250-250-65 MG tablet Take 2 tablets by mouth daily as needed for headache or migraine.   Yes [provider]  calcium carbonate (TUMS - DOSED IN MG ELEMENTAL CALCIUM) 500 MG chewable tablet Chew 1 tablet by mouth 3 (three) times daily as needed (for acid reflux.).   Yes [provider]  finasteride (PROSCAR) 5 MG tablet Take 5 mg by mouth at bedtime.    Yes [provider]  gabapentin (NEURONTIN) 600 MG tablet Take 600 mg by mouth 2 (two) times daily as needed (for pain (typically once daily in the morning)).  07/30/14  Yes [provider]  silodosin (RAPAFLO) 8 MG CAPS capsule Take 8 mg by mouth at bedtime.    Yes [provider]  traMADol (ULTRAM) 50 MG tablet Take 1 tablet (50 mg total) by mouth every 8 (eight) hours as needed. 12/01/16  Yes Biagio Borg, MD  zolpidem (AMBIEN) 5 MG tablet Take 1 tablet (5 mg total) by mouth at bedtime as needed for sleep. 12/01/16  Yes Biagio Borg, MD      Allergies  Allergen Reactions  . Alfuzosin Other (See Comments)    BP went crazy, dizzy, passing out     . Aricept [Donepezil Hcl] Other (See Comments)    Insomnia, headache  . Penicillins Hives    PATIENT HAS HAD A PCN REACTION WITH IMMEDIATE RASH, FACIAL/TONGUE/THROAT SWELLING, SOB, OR LIGHTHEADEDNESS WITH HYPOTENSION:  #  #  #  YES  #  #  #   Has patient had a PCN reaction causing severe rash involving mucus membranes or skin necrosis:No Has patient had a PCN reaction that required hospitalization:No Has patient had a PCN reaction occurring within the last 10 years:No If all of the above answers are "NO", then may proceed with Cephalosporin use.     ROS:  Out of a complete 14 system review of symptoms, the patient complains only of the following symptoms, and all other reviewed systems are negative.  Weight loss Cough Joint pain Urination problems Not enough sleep, decreased energy Memory loss, confusion Insomnia  Blood pressure 105/61,  pulse 98, height 5\' 9"  (1.753 m), weight 159 lb 8 oz (72.3 kg).  Physical Exam  General: The patient is alert and cooperative at the time of the examination.  Eyes: Pupils are equal, round, and reactive to light. Discs are flat bilaterally.  Neck: The neck is supple, no carotid bruits are noted.  Respiratory: The respiratory examination is clear.  Cardiovascular: The cardiovascular examination reveals a regular rate and rhythm, no obvious murmurs or rubs are noted.  Skin: Extremities are without significant edema.  Neurologic Exam  Mental status: The patient is alert and oriented x 3 at the time of the examination. The patient has apparent normal recent and remote memory, with an apparently normal attention span and concentration ability.  Mini-Mental status examination done today shows a total score 29/30.  Cranial nerves: Facial symmetry is present. There is good sensation of the face to pinprick and soft touch bilaterally. The strength of the facial muscles and the muscles to head turning and shoulder shrug are normal bilaterally.  Speech is well enunciated, no aphasia or dysarthria is noted. Extraocular movements are full. Visual fields are full. The tongue is midline, and the patient has symmetric elevation of the soft palate. No obvious hearing deficits are noted.  Motor: The motor testing reveals 5 over 5 strength of all 4 extremities. Good symmetric motor tone is noted throughout.  Sensory: Sensory testing is intact to pinprick, soft touch, vibration sensation, and position sense on all 4 extremities. No evidence of extinction is noted.  Coordination: Cerebellar testing reveals good finger-nose-finger and heel-to-shin bilaterally.  Gait and station: Gait is normal. Tandem gait is normal. Romberg is negative. No drift is seen.  Reflexes: Deep tendon reflexes are symmetric and normal bilaterally. Toes are downgoing bilaterally.   MRI brain 12/22/16:  IMPRESSION: 1. Stable normal MRI of the brain for patient age. No intracranial metastasis or other abnormality. 2. Chronic left sphenoid sinusitis.   Assessment/Plan:  1.  Mild cognitive impairment  2.  History of lung cancer  The patient will be sent for further blood work today.  It appears that his mental status worsened during radiation therapy, but the patient also has significant issues with insomnia that persists and he does have fatigue during the day.  Improving his sleep pattern may be of some benefit, but it appears that his urinary urgency is a big problem for him at night.  The patient may try increasing his Ambien dose, he may require an extended release tablet in the future.  Given the fact that his mental status has improved, but I will not consider initiation of a medication for memory at this time, we will follow-up in about 6 months.  The memory issue will be followed over time.  Jill Alexanders MD 03/11/2017 11:56 AM  [facility] [address] [place], [place] [address]  Phone [phone] Fax  [phone]

## 2017-03-16 ENCOUNTER — Telehealth: Payer: Self-pay | Admitting: *Deleted

## 2017-03-16 ENCOUNTER — Telehealth: Payer: Self-pay | Admitting: Urology

## 2017-03-16 ENCOUNTER — Telehealth: Payer: Self-pay | Admitting: Medical Oncology

## 2017-03-16 LAB — PARANEOPLASTIC PROFILE 1

## 2017-03-16 LAB — RPR: RPR Ser Ql: NONREACTIVE

## 2017-03-16 NOTE — Telephone Encounter (Signed)
requests call from xrt regarding his breathing. VM left on Dr [facility] nurse line.

## 2017-03-16 NOTE — Telephone Encounter (Signed)
Called and spoke with patient about unremarkable labs per CW,MD note. Pt verbalized understanding.

## 2017-03-16 NOTE — Telephone Encounter (Signed)
I spoke with the patient regarding new development of a dry, hacking cough ongoing for the past week.  He denies any sputum production, chest pain or SOB associated with the cough and denies rhinorhea, fever, chills, N/V or hemoptysis.  He is currently 10 weeks s/p completion of chest radiation so this certainly could be developing radiation pneumonitis but could also certainly be of viral nature as well.  I advised a trial of Delsym OTC for symptom management and requested that he call if he has not had any symptom improvement by Friday of this week. He knows to call sooner should symptoms worsen or new symptoms develop. If there is no improvement by Friday, I will proceed with sending a prescription for a prednisone taper and Levaquin to his pharmacy to treat potential radiation pneumonitis.  He appears to have a good understanding of these recommendations and is in agreement. I will look to hear back from him on Friday if he is not improving.   Nicholos Johns, PA-C

## 2017-03-16 NOTE — Telephone Encounter (Signed)
-----   Message from Kathrynn Ducking, MD sent at 03/16/2017  2:32 PM EST -----  The blood work results are unremarkable. Please call the patient.  ----- Message ----- From: Lavone Neri Lab Results In Sent: 03/12/2017   7:44 AM To: Kathrynn Ducking, MD

## 2017-03-18 ENCOUNTER — Telehealth: Payer: Self-pay | Admitting: Radiation Oncology

## 2017-03-18 ENCOUNTER — Other Ambulatory Visit (INDEPENDENT_AMBULATORY_CARE_PROVIDER_SITE_OTHER): Payer: Medicare Other

## 2017-03-18 ENCOUNTER — Other Ambulatory Visit: Payer: Self-pay | Admitting: Internal Medicine

## 2017-03-18 ENCOUNTER — Encounter: Payer: Self-pay | Admitting: Internal Medicine

## 2017-03-18 ENCOUNTER — Telehealth: Payer: Self-pay | Admitting: Internal Medicine

## 2017-03-18 ENCOUNTER — Ambulatory Visit (INDEPENDENT_AMBULATORY_CARE_PROVIDER_SITE_OTHER): Payer: Medicare Other | Admitting: Internal Medicine

## 2017-03-18 VITALS — BP 102/68 | HR 90 | Temp 98.0°F | Ht 69.0 in | Wt 159.0 lb

## 2017-03-18 DIAGNOSIS — Z23 Encounter for immunization: Secondary | ICD-10-CM

## 2017-03-18 DIAGNOSIS — E785 Hyperlipidemia, unspecified: Secondary | ICD-10-CM | POA: Diagnosis not present

## 2017-03-18 DIAGNOSIS — N320 Bladder-neck obstruction: Secondary | ICD-10-CM

## 2017-03-18 DIAGNOSIS — J449 Chronic obstructive pulmonary disease, unspecified: Secondary | ICD-10-CM | POA: Diagnosis not present

## 2017-03-18 DIAGNOSIS — G4700 Insomnia, unspecified: Secondary | ICD-10-CM | POA: Diagnosis not present

## 2017-03-18 DIAGNOSIS — R739 Hyperglycemia, unspecified: Secondary | ICD-10-CM

## 2017-03-18 DIAGNOSIS — R059 Cough, unspecified: Secondary | ICD-10-CM

## 2017-03-18 DIAGNOSIS — R05 Cough: Secondary | ICD-10-CM | POA: Diagnosis not present

## 2017-03-18 LAB — CBC WITH DIFFERENTIAL/PLATELET
BASOS ABS: 0.1 10*3/uL (ref 0.0–0.1)
Basophils Relative: 0.8 % (ref 0.0–3.0)
Eosinophils Absolute: 0.1 10*3/uL (ref 0.0–0.7)
Eosinophils Relative: 1 % (ref 0.0–5.0)
HCT: 35.4 % — ABNORMAL LOW (ref 39.0–52.0)
Hemoglobin: 11.8 g/dL — ABNORMAL LOW (ref 13.0–17.0)
LYMPHS ABS: 0.6 10*3/uL — AB (ref 0.7–4.0)
Lymphocytes Relative: 9.4 % — ABNORMAL LOW (ref 12.0–46.0)
MCHC: 33.4 g/dL (ref 30.0–36.0)
MCV: 85 fl (ref 78.0–100.0)
Monocytes Absolute: 0.6 10*3/uL (ref 0.1–1.0)
Monocytes Relative: 9.5 % (ref 3.0–12.0)
NEUTROS ABS: 5.1 10*3/uL (ref 1.4–7.7)
NEUTROS PCT: 79.3 % — AB (ref 43.0–77.0)
PLATELETS: 346 10*3/uL (ref 150.0–400.0)
RBC: 4.16 Mil/uL — ABNORMAL LOW (ref 4.22–5.81)
RDW: 13.3 % (ref 11.5–15.5)
WBC: 6.4 10*3/uL (ref 4.0–10.5)

## 2017-03-18 LAB — URINALYSIS, ROUTINE W REFLEX MICROSCOPIC
Bilirubin Urine: NEGATIVE
HGB URINE DIPSTICK: NEGATIVE
Nitrite: NEGATIVE
Specific Gravity, Urine: 1.02 (ref 1.000–1.030)
URINE GLUCOSE: NEGATIVE
Urobilinogen, UA: 0.2 (ref 0.0–1.0)
pH: 5.5 (ref 5.0–8.0)

## 2017-03-18 LAB — HEPATIC FUNCTION PANEL
ALT: 6 U/L (ref 0–53)
AST: 12 U/L (ref 0–37)
Albumin: 3.4 g/dL — ABNORMAL LOW (ref 3.5–5.2)
Alkaline Phosphatase: 99 U/L (ref 39–117)
Bilirubin, Direct: 0.1 mg/dL (ref 0.0–0.3)
Total Bilirubin: 0.5 mg/dL (ref 0.2–1.2)
Total Protein: 6.2 g/dL (ref 6.0–8.3)

## 2017-03-18 LAB — BASIC METABOLIC PANEL
BUN: 19 mg/dL (ref 6–23)
CHLORIDE: 105 meq/L (ref 96–112)
CO2: 30 mEq/L (ref 19–32)
Calcium: 8.7 mg/dL (ref 8.4–10.5)
Creatinine, Ser: 1.02 mg/dL (ref 0.40–1.50)
GFR: 73.87 mL/min (ref >60.00)
Glucose, Bld: 99 mg/dL (ref 70–99)
POTASSIUM: 4.2 meq/L (ref 3.5–5.1)
SODIUM: 140 meq/L (ref 135–145)

## 2017-03-18 LAB — HEMOGLOBIN A1C: Hgb A1c MFr Bld: 5.8 % (ref 4.6–6.5)

## 2017-03-18 LAB — LIPID PANEL
CHOL/HDL RATIO: 5
Cholesterol: 169 mg/dL (ref 0–200)
HDL: 35.9 mg/dL — AB (ref >39.00)
LDL CALC: 108 mg/dL — AB (ref 0–99)
NonHDL: 132.68
TRIGLYCERIDES: 122 mg/dL (ref 0.0–149.0)
VLDL: 24.4 mg/dL (ref 0.0–40.0)

## 2017-03-18 LAB — TSH: TSH: 0.47 u[IU]/mL (ref 0.35–4.50)

## 2017-03-18 LAB — PSA: PSA: 1.91 ng/mL (ref 0.10–4.00)

## 2017-03-18 MED ORDER — DOXYCYCLINE HYCLATE 100 MG PO TABS: 100.0000 mg | ORAL_TABLET | Freq: Two times a day (BID) | ORAL | 0 refills | Status: DC

## 2017-03-18 MED ORDER — AZITHROMYCIN 250 MG PO TABS
ORAL_TABLET | ORAL | 1 refills | Status: DC
Start: 2017-03-18 — End: 2017-03-18

## 2017-03-18 MED ORDER — HYDROCODONE-HOMATROPINE 5-1.5 MG/5ML PO SYRP: 5.0000 mL | ORAL_SOLUTION | Freq: Four times a day (QID) | ORAL | 0 refills | Status: AC | PRN

## 2017-03-18 MED ORDER — PREDNISONE 10 MG PO TABS: ORAL_TABLET | ORAL | 0 refills | Status: DC

## 2017-03-18 MED ORDER — ZOLPIDEM TARTRATE ER 12.5 MG PO TBCR: 12.5000 mg | EXTENDED_RELEASE_TABLET | Freq: Every evening | ORAL | 1 refills | Status: DC | PRN

## 2017-03-18 NOTE — Progress Notes (Signed)
Subjective:    Patient ID: Raymond Logan, male    DOB: 11-12-1930, 84 y.o.   MRN: [id number]  HPI  Here with c/o several wks of persistent non prod cough but just driving him crazy and nearly constant persistent worsening in frequency for several wks; nothing seems to make better or worse and no overt reflux, sinus symptoms or fever.  Has been thought to be inflammatory post xrt per surgury, but pt states f/u with radiation oncology opinion was most likely not related due to persistent cough more than several wks post xrt.  Denies fever, and Pt denies increased sob or doe, wheezing, orthopnea, PND, increased LE swelling, palpitations, dizziness or syncope, but has fleeting intermittent diffuse torso pains,  Also c/o worsening sleep not all related to cough, tried wifes ambien 12.5 and requests rx.   Denies worsening depressive symptoms, suicidal ideation, or panic Past Medical History:  Diagnosis Date  . ALLERGIC RHINITIS 01/23/2007  . Arthritis    hands  . BENIGN PROSTATIC HYPERTROPHY 09/10/2006  . Chronic headaches   . Complication of anesthesia    difficulty voiding- if cath needed needs to placed with need to be done with scope  . COPD, MILD 04/03/2010   patient denies on preop visit of 08/02/2014   . Difficulty in urination   . FATIGUE 01/08/2008  . GERD 04/15/2010  . H. pylori infection    hx of   . Headache(784.0) 09/10/2006  . HOARSENESS 04/15/2010  . HYPERLIPIDEMIA 09/10/2006  . INSOMNIA-SLEEP DISORDER-UNSPEC 04/24/2009  . PEPTIC ULCER DISEASE 01/23/2007  . Pneumonia    1955   Past Surgical History:  Procedure Laterality Date  . CATARACT EXTRACTION Left   . COLONOSCOPY    . CYSTOSCOPY N/A 10/07/2016   Procedure: CYSTOSCOPY;  Surgeon: Festus Aloe, MD;  Location: [place];  Service: Urology;  Laterality: N/A;  . HEMORRHOID BANDING    . INSERTION OF SUPRAPUBIC CATHETER N/A 10/07/2016   Procedure: FOLEY  CATHETER PLACEMENT;  Surgeon: Festus Aloe, MD;  Location: [place];   Service: Urology;  Laterality: N/A;  . KNEE ARTHROSCOPY Left    torn meniscus  . LUMBAR LAMINECTOMY/DECOMPRESSION MICRODISCECTOMY Right 08/07/2014   Procedure: complete DECOMPRESSION LUMBAR LAMINECTOMY/MICRODISCECTOMY OF L4-L5 for spinal stenosis, DECOMPRESSION OF L3-L4;  Surgeon: Latanya Maudlin, MD;  Location: [facility];  Service: Orthopedics;  Laterality: Right;  . ROTATOR CUFF REPAIR Right   . SHOULDER ARTHROSCOPY     x3, L & R  . VIDEO ASSISTED THORACOSCOPY (VATS)/ LOBECTOMY Right 10/07/2016   Procedure: VIDEO ASSISTED THORACOSCOPY (VATS)/RIGHT MIDDLE LOBECTOMY;  Surgeon: [name], MD;  Location: [place];  Service: Thoracic;  Laterality: Right;  [name] VIDEO BRONCHOSCOPY WITH ENDOBRONCHIAL NAVIGATION N/A 09/23/2016   Procedure: VIDEO BRONCHOSCOPY WITH ENDOBRONCHIAL NAVIGATION;  Surgeon: [name], MD;  Location: [place];  Service: Thoracic;  Laterality: N/A;  . VIDEO BRONCHOSCOPY WITH ENDOBRONCHIAL ULTRASOUND N/A 09/23/2016   Procedure: VIDEO BRONCHOSCOPY WITH ENDOBRONCHIAL ULTRASOUND;  Surgeon: [name], MD;  Location: [place];  Service: Thoracic;  Laterality: N/A;    reports that he quit smoking about 41 years ago. He has a 23.00 pack-year smoking history. he has never used smokeless tobacco. He reports that he does not drink alcohol or use drugs. family history includes Alzheimer's disease in his mother and sister; Prostate cancer in his brother. Allergies  Allergen Reactions  . Alfuzosin Other (See Comments)    BP went crazy, dizzy, passing out   . Aricept [Donepezil Hcl] Other (See  Comments)    Insomnia, headache  . Penicillins Hives    PATIENT HAS HAD A PCN REACTION WITH IMMEDIATE RASH, FACIAL/TONGUE/THROAT SWELLING, SOB, OR LIGHTHEADEDNESS WITH HYPOTENSION:  #  #  #  YES  #  #  #   Has patient had a PCN reaction causing severe rash involving mucus membranes or skin necrosis:No Has patient had a PCN reaction that required hospitalization:No Has patient had a PCN  reaction occurring within the last 10 years:No If all of the above answers are "NO", then may proceed with Cephalosporin use.    Current Outpatient Medications on File Prior to Visit  Medication Sig Dispense Refill  . acetaminophen (TYLENOL) 500 MG tablet Take 2 tablets (1,000 mg total) by mouth every 6 (six) hours as needed. 30 tablet 0  . aspirin-acetaminophen-caffeine (EXCEDRIN MIGRAINE) 250-250-65 MG tablet Take 2 tablets by mouth daily as needed for headache or migraine.    . calcium carbonate (TUMS - DOSED IN MG ELEMENTAL CALCIUM) 500 MG chewable tablet Chew 1 tablet by mouth 3 (three) times daily as needed (for acid reflux.).    [name] finasteride (PROSCAR) 5 MG tablet Take 5 mg by mouth at bedtime.     . gabapentin (NEURONTIN) 600 MG tablet Take 600 mg by mouth 2 (two) times daily as needed (for pain (typically once daily in the morning)).   0  . silodosin (RAPAFLO) 8 MG CAPS capsule Take 8 mg by mouth at bedtime.     . traMADol (ULTRAM) 50 MG tablet Take 1 tablet (50 mg total) by mouth every 8 (eight) hours as needed. 120 tablet 2   No current facility-administered medications on file prior to visit.    Review of Systems  Constitutional: Negative for other unusual diaphoresis or sweats HENT: Negative for ear discharge or swelling Eyes: Negative for other worsening visual disturbances Respiratory: Negative for stridor or other swelling  Gastrointestinal: Negative for worsening distension or other blood Genitourinary: Negative for retention or other urinary change Musculoskeletal: Negative for other MSK pain or swelling Skin: Negative for color change or other new lesions Neurological: Negative for worsening tremors and other numbness  Psychiatric/Behavioral: Negative for worsening agitation or other fatigue All other system neg per pt    Objective:   Physical Exam BP 102/68   Pulse 90   Temp 98 F (36.7 C) (Oral)   Ht 5\' 9"  (1.753 m)   Wt 159 lb (72.1 kg)   SpO2 99%   BMI  23.48 kg/m  VS noted, non toxic Constitutional: Pt appears in NAD HENT: Head: NCAT.  Right Ear: External ear normal.  Left Ear: External ear normal.  Eyes: . Pupils are equal, round, and reactive to light. Conjunctivae and EOM are normal Nose: without d/c or deformity Neck: Neck supple. Gross normal ROM Cardiovascular: Normal rate and regular rhythm.   Pulmonary/Chest: Effort normal and breath sounds decreased without rales or wheezing.  Abd:  Soft, NT, ND, + BS, no organomegaly Neurological: Pt is alert. At baseline orientation, motor grossly intact Skin: Skin is warm. No rashes, other new lesions, no LE edema Psychiatric: Pt behavior is normal without agitation  No other exam findings  Last CXR:  03/08/2017 IMPRESSION: Atelectasis or infiltrate in the inferior aspect of the right upper lobe or superior segment of the right lower lobe posteriorly which may be post obstructive. Persistent small right pleural effusion.     Assessment & Plan:

## 2017-03-18 NOTE — Telephone Encounter (Signed)
I am not sure about this, as the rx sent today and recorded on the EMR shows ambien 12.5 mg - 1 tab qhs prn, #90  I dont see need to change this

## 2017-03-18 NOTE — Patient Instructions (Signed)
You had the Prevnar 13 pneumonia shot today  Please take all new medication as prescribed - the higher dose ambien sent to [facility]  Please take all new medication as prescribed - the antibiotic, cough medicine and prednisone sent to [facility]  Please continue all other medications as before, and refills have been done if requested.  Please have the pharmacy call with any other refills you may need.  Please continue your efforts at being more active, low cholesterol diet, and weight control.  You are otherwise up to date with prevention measures today.  Please keep your appointments with your specialists as you may have planned  Please go to the LAB in the Basement (turn left off the elevator) for the tests to be done today  You will be contacted by phone if any changes need to be made immediately.  Otherwise, you will receive a letter about your results with an explanation, but please check with MyChart first.  Please remember to sign up for MyChart if you have not done so, as this will be important to you in the future with finding out test results, communicating by private email, and scheduling acute appointments online when needed.  Please return in 6 months, or sooner if needed

## 2017-03-18 NOTE — Telephone Encounter (Signed)
Discovered a voicemail message from this patient on Gaspar Garbe, RN's voicemail. Thayer Headings retired 03/04/2017. Telephone message was two days old. Janice's old voicemail has since been forwarded to the nursing secretary to prevent future events such as this. In the voicemail the patient was requesting a return call about his cough. Phoned patient's home. Spoke with both the patient and his wife. Patient reports a hacking dry cough that keeps him awake at night. Patient denies sputum production, chest pain, sob, hemoptysis, fever or chills. I explained the patient's cough is most likely due to the radiation he received. Explained the hydrocodone-homatropine syrup prescribed by Dr. Jenny Reichmann will help to relieve the cough. The patient reports he just recently took the initial dose of cough syrup thus, isn't sure yet if it will help. Attempted to reassure patient the cough syrup would most likely help. Wife reports the patient is having difficulty sleeping. Explained the Ambien prescribed today by Dr. Jenny Reichmann should help him sleep tonight. Wife reports the patient poured out all his pills and mixed them up. She stated, "now we aren't sure which ones he should take and when." Advised they collect all the pills, take them back to the pharmacy, and allow the pharmacy staff to help them sort the pills. Instructed both the patient and his wife to call back to Dr. Ida Rogue office and schedule a follow up appointment if he isn't feeling better after he completes the prednisone taper and antibiotics. Both verbalized understanding of all reviewed. This conversation proved to be challenging because the patient and his wife spoke over one another and repeatedly handed the phone back and forth. Persistent repeating, reinforcement and reassurance was required for understanding.

## 2017-03-18 NOTE — Telephone Encounter (Signed)
Copied from [place] [id number]. Topic: Quick Communication - Rx Refill/Question >> Mar 18, 2017  4:24 PM Oliver Pila B wrote: Medication: zolpidem (AMBIEN CR) 12.5 MG CR tablet [[id number]]  Has the patient contacted their pharmacy? Yes.   (Agent: If no, request that the patient contact the pharmacy for the refill.) Preferred Pharmacy (with phone number or street name): [facility] Agent: Please be advised that RX refills may take up to 3 business days. We ask that you follow-up with your pharmacy.  Pt states the Rx states 0.5mg  instead of 12.5mg , contact to advise

## 2017-03-19 NOTE — Assessment & Plan Note (Signed)
stable overall by history and exam, recent data reviewed with pt, and pt to continue medical treatment as before,  to f/u any worsening symptoms or concerns Lab Results  Component Value Date   HGBA1C 5.8 03/18/2017

## 2017-03-19 NOTE — Assessment & Plan Note (Signed)
Lab Results  Component Value Date   LDLCALC 108 (H) 03/18/2017  stable overall by history and exam, recent data reviewed with pt, and pt to continue medical treatment as before,  to f/u any worsening symptoms or concerns

## 2017-03-19 NOTE — Assessment & Plan Note (Signed)
O/w stable overall by history and exam, recent data reviewed with pt, and pt to continue medical treatment as before,  to f/u any worsening symptoms or concerns

## 2017-03-19 NOTE — Assessment & Plan Note (Addendum)
Etiology unclear, for cough med prn, also trial antibx, predpac, declines cxr repeat for now, for imaging or ER if worsens  Note:  Total time for pt hx, exam, review of record with pt in the room, determination of diagnoses and plan for further eval and tx is > 40 min, with over 50% spent in coordination and counseling of patient including the differential dx, tx, further evaluation and other management of persistent cough with recent abnormal cxr and hx of lung ca, insomnia, HLD, HTN, COPD, insomnia, and hyperglycemia

## 2017-03-19 NOTE — Assessment & Plan Note (Signed)
Ok for higher strength ambien qhs prn,  to f/u any worsening symptoms or concerns

## 2017-03-21 ENCOUNTER — Telehealth: Payer: Self-pay

## 2017-03-21 ENCOUNTER — Telehealth: Payer: Self-pay | Admitting: Internal Medicine

## 2017-03-21 NOTE — Telephone Encounter (Signed)
-----   Message from Biagio Borg, MD sent at 03/18/2017  5:08 PM EST ----- Letter sent, cont same tx except  The test results show that your current treatment is OK, except the urine test has increased white blood cells which sometimes means an infection of the urinary tract.  I will ask the office to call you, but it is OK to change the Zpack antibiotic to doxycycline.  I will send a prescription.    Raymond Logan to please inform pt, I will do rx

## 2017-03-21 NOTE — Telephone Encounter (Signed)
Spoke with pt earlier, he is aware that a new antibiotic doxycycline has been called in due to the UA showing signs of a UTI. No need to send in another zpack.

## 2017-03-21 NOTE — Telephone Encounter (Signed)
Pt has been informed and expressed understanding.  

## 2017-03-21 NOTE — Telephone Encounter (Signed)
Patient's wife is calling to let provider know that her husband did not take the previous antibiotic- he messed the prescription up and he discarded them. His wife states if you will send a new Rx in - she will make sure he takes them correctly. Please call then to [facility] [address] She also wants the provider to know that the Ambien is not helping her husband sleep- she thinks that it may be because the patient is still getting up to go to the bathroom so many times during the night.

## 2017-03-21 NOTE — Telephone Encounter (Signed)
Patient wife is calling and upset stating the doxycycline and sleeping pills were supposed to be sent into [facility] instead of the mail order service. Please advise.

## 2017-03-22 MED ORDER — DOXYCYCLINE HYCLATE 100 MG PO TABS: 100.0000 mg | ORAL_TABLET | Freq: Two times a day (BID) | ORAL | 0 refills | Status: DC

## 2017-03-22 MED ORDER — ZOLPIDEM TARTRATE ER 12.5 MG PO TBCR: 12.5000 mg | EXTENDED_RELEASE_TABLET | Freq: Every evening | ORAL | 1 refills | Status: DC | PRN

## 2017-03-22 NOTE — Telephone Encounter (Signed)
This has been done, thanks

## 2017-03-22 NOTE — Addendum Note (Signed)
Addended by: Juliet Rude on: 03/22/2017 08:02 AM   Modules accepted: Orders

## 2017-03-22 NOTE — Addendum Note (Signed)
Addended by: Biagio Borg on: 03/22/2017 12:36 PM   Modules accepted: Orders

## 2017-03-22 NOTE — Telephone Encounter (Signed)
Spoke with pt and informed him that we will correct the mistake and send in the medications to [facility].  Dr. Jenny Reichmann please advise.

## 2017-03-24 MED ORDER — DOXYCYCLINE HYCLATE 100 MG PO TABS: 100.0000 mg | ORAL_TABLET | Freq: Two times a day (BID) | ORAL | 0 refills | Status: DC

## 2017-03-24 MED ORDER — PREDNISONE 10 MG PO TABS: ORAL_TABLET | ORAL | 0 refills | Status: DC

## 2017-03-24 NOTE — Telephone Encounter (Signed)
Patient wife called back and said the pharmacy denied pt's refill request for zolpidem (AMBIEN CR) 12.5 MG CR tablet. Wife wanted to know why medication was denied and would like a call back.

## 2017-03-24 NOTE — Telephone Encounter (Signed)
I have refilled the doxy and prednisone  I cannot refill the hycodan on the wife's request due to her chronic mental illness and the controlled substance nature of the medication

## 2017-03-24 NOTE — Telephone Encounter (Signed)
Patient's wife called back to inform her the Lorrin Mais is at [facility]. She went on to say that "the medication Dr. Jenny Reichmann gave my husband, the two pill prescription, he never took them. He put them in the daily pill boxes and got them mixed up, so the threw them all out. He is not getting any better. Will you ask Dr. Jenny Reichmann if he can re-order the medicine so he can start taking them tonight. Also, he needs another refill on the cough medicine he ordered." The medicines are: Doxycylcline, prednisone, hycodan cough syrup. I told her Dr. Jenny Reichmann will be made aware.

## 2017-03-25 ENCOUNTER — Telehealth: Payer: Self-pay | Admitting: Internal Medicine

## 2017-03-25 NOTE — Telephone Encounter (Signed)
Patient states diarrhea has stopped, but she is very upset that dr Jenny Reichmann did not personally call her back, I have explained that usually assistants call the patients back after getting advisement from the physicians, but since his diarrhea has stopped, there's really nothing further to talk about right now unless she wants to come in for office visit and have him looked at for overall health---and if diarrhea starts again and she feels he needs to be seen or she's worried her husband is getting too weak over the weekend (our office is closed), to call 911 and go to ED

## 2017-03-25 NOTE — Telephone Encounter (Signed)
Copied from [place] [id number]. Topic: General - Other >> Mar 24, 2017  4:52 PM Yvette Rack wrote: Reason for CRM: PATIENT STATES THAT THE zolpidem (AMBIEN CR) 12.5 MG CR tablet doxycycline (VIBRA-TABS) 100 MG tablet WASN'T AT THE PHARMACY

## 2017-03-25 NOTE — Telephone Encounter (Signed)
Pt has been informed and expressed understanding.  

## 2017-03-25 NOTE — Telephone Encounter (Signed)
Called pt inform per chart MD sent [facility].Recieved confirmation that rx's has was received.Marland KitchenJohny Logan

## 2017-04-06 ENCOUNTER — Telehealth: Payer: Self-pay | Admitting: Internal Medicine

## 2017-04-06 NOTE — Telephone Encounter (Signed)
Copied from [place]. Topic: Quick Communication - See Telephone Encounter >> Apr 06, 2017 12:24 PM Percell Belt A wrote: CRM for notification. See Telephone encounter for: pt wife called in and said that pt is out of his cough meds.  She said he has lung cancer and he pt has a really bad cough.  I did not see this on his med list   [facility] [address]  04/06/17.

## 2017-04-06 NOTE — Telephone Encounter (Signed)
As stated in the 03/18/17 phone encounter the patient was made aware that cough syrup could not be called in again due to it being a controlled substance and not by his wife's request due to her mental illness and he expressed understanding. If he is still having an issue with a cough he should make an OV to see Dr. Jenny Reichmann or follow up with his pulmonologist due to patient having lung cancer.    I have reached out to the patient and had another conversation with him and he expressed understanding again. I informed the patient that if he does want to come see JJ that we could put him on the books if he likes. He then stated that he will follow up with his pulmonologist since he stated that he has been having a breathing issue in addition to the cough.

## 2017-04-08 ENCOUNTER — Encounter: Payer: Self-pay | Admitting: Internal Medicine

## 2017-04-08 ENCOUNTER — Other Ambulatory Visit (INDEPENDENT_AMBULATORY_CARE_PROVIDER_SITE_OTHER): Payer: Medicare Other

## 2017-04-08 ENCOUNTER — Ambulatory Visit (INDEPENDENT_AMBULATORY_CARE_PROVIDER_SITE_OTHER): Payer: Medicare Other | Admitting: Internal Medicine

## 2017-04-08 VITALS — BP 84/58 | HR 104 | Ht 68.0 in | Wt 154.0 lb

## 2017-04-08 DIAGNOSIS — R0609 Other forms of dyspnea: Secondary | ICD-10-CM | POA: Diagnosis not present

## 2017-04-08 DIAGNOSIS — R413 Other amnesia: Secondary | ICD-10-CM

## 2017-04-08 DIAGNOSIS — R059 Cough, unspecified: Secondary | ICD-10-CM

## 2017-04-08 DIAGNOSIS — J449 Chronic obstructive pulmonary disease, unspecified: Secondary | ICD-10-CM

## 2017-04-08 DIAGNOSIS — I4891 Unspecified atrial fibrillation: Secondary | ICD-10-CM

## 2017-04-08 DIAGNOSIS — I480 Paroxysmal atrial fibrillation: Secondary | ICD-10-CM | POA: Insufficient documentation

## 2017-04-08 DIAGNOSIS — R05 Cough: Secondary | ICD-10-CM | POA: Diagnosis not present

## 2017-04-08 LAB — URINALYSIS, ROUTINE W REFLEX MICROSCOPIC
Bilirubin Urine: NEGATIVE
KETONES UR: NEGATIVE
Nitrite: NEGATIVE
PH: 7 (ref 5.0–8.0)
SPECIFIC GRAVITY, URINE: 1.015 (ref 1.000–1.030)
URINE GLUCOSE: NEGATIVE
UROBILINOGEN UA: 0.2 (ref 0.0–1.0)

## 2017-04-08 LAB — CBC WITH DIFFERENTIAL/PLATELET
BASOS ABS: 23 {cells}/uL (ref 0–200)
Basophils Absolute: 31 cells/uL (ref 0–200)
Basophils Relative: 0.4 %
Basophils Relative: 0.3 %
EOS PCT: 0.9 %
EOS PCT: 0.8 %
Eosinophils Absolute: 69 cells/uL (ref 15–500)
Eosinophils Absolute: 60 cells/uL (ref 15–500)
HCT: 38 % — ABNORMAL LOW (ref 38.5–50.0)
HCT: 38.2 % — ABNORMAL LOW (ref 38.5–50.0)
HEMOGLOBIN: 12.8 g/dL — AB (ref 13.2–17.1)
Hemoglobin: 12.9 g/dL — ABNORMAL LOW (ref 13.2–17.1)
Lymphs Abs: 755 cells/uL — ABNORMAL LOW (ref 850–3900)
Lymphs Abs: 735 cells/uL — ABNORMAL LOW (ref 850–3900)
MCH: 28.5 pg (ref 27.0–33.0)
MCH: 28.1 pg (ref 27.0–33.0)
MCHC: 33.9 g/dL (ref 32.0–36.0)
MCHC: 33.5 g/dL (ref 32.0–36.0)
MCV: 83.9 fL (ref 80.0–100.0)
MCV: 84 fL (ref 80.0–100.0)
MONOS PCT: 9.8 %
MONOS PCT: 9.1 %
MPV: 9.6 fL (ref 7.5–12.5)
MPV: 9.7 fL (ref 7.5–12.5)
NEUTROS ABS: 6000 {cells}/uL (ref 1500–7800)
Neutro Abs: 6091 cells/uL (ref 1500–7800)
Neutrophils Relative %: 79.1 %
Neutrophils Relative %: 80 %
PLATELETS: 278 10*3/uL (ref 140–400)
Platelets: 283 10*3/uL (ref 140–400)
RBC: 4.53 10*6/uL (ref 4.20–5.80)
RBC: 4.55 10*6/uL (ref 4.20–5.80)
RDW: 13.5 % (ref 11.0–15.0)
RDW: 13.4 % (ref 11.0–15.0)
TOTAL LYMPHOCYTE: 9.8 %
Total Lymphocyte: 9.8 %
WBC mixed population: 755 cells/uL (ref 200–950)
WBC mixed population: 683 cells/uL (ref 200–950)
WBC: 7.7 10*3/uL (ref 3.8–10.8)
WBC: 7.5 10*3/uL (ref 3.8–10.8)

## 2017-04-08 LAB — BRAIN NATRIURETIC PEPTIDE: PRO B NATRI PEPTIDE: 164 pg/mL — AB (ref 0.0–100.0)

## 2017-04-08 LAB — SEDIMENTATION RATE: Sed Rate: 23 mm/hr — ABNORMAL HIGH (ref 0–20)

## 2017-04-08 MED ORDER — FAMOTIDINE 20 MG PO TABS: ORAL_TABLET | ORAL | 2 refills | Status: DC

## 2017-04-08 MED ORDER — PANTOPRAZOLE SODIUM 40 MG PO TBEC: 40.0000 mg | DELAYED_RELEASE_TABLET | Freq: Every day | ORAL | 2 refills | Status: DC

## 2017-04-08 NOTE — Patient Instructions (Addendum)
Change the gabpentin to one twice daily and if thinking doesn't improve then reduce to one half twice daily   Call your bladder doctor for follow up as you may need adjustment on your rapaflo   Pantoprazole (protonix) 40 mg   Take  30-60 min before first meal of the day and Pepcid (famotidine)  20 mg one @  bedtime until return to office - this is the best way to tell whether stomach acid is contributing to your problem.    GERD (REFLUX)  is an extremely common cause of respiratory symptoms just like yours , many times with no obvious heartburn at all.    It can be treated with medication, but also with lifestyle changes including elevation of the head of your bed (ideally with 6 inch  bed blocks),  Smoking cessation, avoidance of late meals, excessive alcohol, and avoid fatty foods, chocolate, peppermint, colas, red wine, and acidic juices such as orange juice.  NO MINT OR MENTHOL PRODUCTS SO NO COUGH DROPS   USE SUGARLESS CANDY INSTEAD (Jolley ranchers or Stover's or Life Savers) or even ice chips will also do - the key is to swallow to prevent all throat clearing. NO OIL BASED VITAMINS - use powdered substitutes.    Please remember to go to the lab and x-ray department downstairs in the basement  for your tests - we will call you with the results when they are available.     Please see patient coordinator before you leave today  to schedule sinus CT and refer to cards   Please schedule a follow up office visit in 4 weeks, sooner if needed with pfts on return

## 2017-04-08 NOTE — Progress Notes (Signed)
Subjective:     Patient ID: RAHSHAWN REMO, male   DOB: August 12, 1930,    MRN: [id number]    Brief patient profile:  57 yowm quit smoking in 1978   From [place]  graduated fom [employer] in [place],  very active including full court basketball until L knee slowed him down about 2017 and planned L TKR but preop cxr showed lung mass so referred to pulmonary clinic 08/26/2016 by Dr   Mayer Camel for pulmonary eval.     History of Present Illness  08/26/2016 1st [facility] office visit/ Paticia Moster   Chief Complaint  Patient presents with  . consult    Pt sees Dr. Mayer Camel was scheduled left knee surgery for August 23 2016, had routine pre otp for surgery conducted chest Xray, than sent for CT scan and PET yesterday. Concerned regarding xray results, also thinks t may have UTI. Pt is normally active, breathing is good exercises daily.   Not limited by breathing from desired activities   rec thoracoscopic right middle lobectomy on 10/07/2016 for a stage IA3 (T1c, N0, M0) adenocarcinoma.  He had a very close vascular margin and underwent adjuvant radiation by Dr. [name].  During the course of radiation he developed a right pleural effusion.  After draining the effusion he developed anex vacuo ptx    04/08/2017  Extended acute  ov/Kemari Narez re: new cough and sob  Chief Complaint  Patient presents with  . Acute Visit    Increased cough and SOB over the past month. His cough is non prod. He states he gets SOB with any exertion such as just taking a shower or getting up from a sitting position.   indolent onset progressively worse Dyspnea:   MMRC3 = can't walk 100 yards even at a slow pace at a flat grade s stopping due to sob   also limited by knee and coughing  Cough: better if breath thru nose / worse x one month non prod Sleep: able to lie flat    No obvious day to day or daytime variability or assoc excess/ purulent sputum or mucus plugs or hemoptysis or cp or chest tightness, subjective wheeze or overt sinus or hb  symptoms. No unusual exposure hx or h/o childhood pna/ asthma or knowledge of premature birth.  Sleeping ok flat without nocturnal  or early am exacerbation  of respiratory  c/o's or need for noct saba. Also denies any obvious fluctuation of symptoms with weather or environmental changes or other aggravating or alleviating factors except as outlined above   Current Allergies, Complete Past Medical History, Past Surgical History, Family History, and Social History were reviewed in [facility] record.  ROS  The following are not active complaints unless bolded Hoarseness, sore throat, dysphagia, dental problems, itching, sneezing,  nasal congestion or discharge of excess mucus or purulent secretions, ear ache,   fever, chills, sweats, unintended wt loss or wt gain, classically pleuritic or exertional cp,  orthopnea pnd or leg swelling, presyncope, palpitations, abdominal pain, anorexia, nausea, vomiting, diarrhea  or change in bowel habits or change in bladder habits, change in stools or change in urine, dysuria, hematuria,  rash, arthralgias, visual complaints, headache, numbness, weakness or ataxia or problems with walking or coordination,  change in mood/affect or memory ? "Is it the gabapentin" (taking 600 bid, not prn)         Current Meds  Medication Sig  . acetaminophen (TYLENOL) 500 MG tablet Take 2 tablets (1,000 mg total) by  mouth every 6 (six) hours as needed.  [name] aspirin-acetaminophen-caffeine (EXCEDRIN MIGRAINE) 250-250-65 MG tablet Take 2 tablets by mouth daily as needed for headache or migraine.  . calcium carbonate (TUMS - DOSED IN MG ELEMENTAL CALCIUM) 500 MG chewable tablet Chew 1 tablet by mouth 3 (three) times daily as needed (for acid reflux.).  [name] finasteride (PROSCAR) 5 MG tablet Take 5 mg by mouth at bedtime.   . gabapentin (NEURONTIN) 600 MG tablet Take 600 mg by mouth 2 (two) times daily as needed (for pain (typically once daily in the morning)).   .  silodosin (RAPAFLO) 8 MG CAPS capsule Take 8 mg by mouth at bedtime.   . traMADol (ULTRAM) 50 MG tablet Take 1 tablet (50 mg total) by mouth every 8 (eight) hours as needed.  . zolpidem (AMBIEN CR) 12.5 MG CR tablet Take 1 tablet (12.5 mg total) by mouth at bedtime as needed for sleep.                        Objective:   Physical Exam  amb hoarse wm nad   04/08/2017         154   08/26/16 163 lb 3.2 oz (74 kg)  08/18/16 165 lb (74.8 kg)  08/12/16 161 lb (73 kg)    Vital signs reviewed - Note on arrival 02 sats  97% on RA  And note BP 84/58 though pt denies orthostasis    HEENT: nl dentition, turbinates bilaterally, and oropharynx. Nl external ear canals without cough reflex   NECK :  without JVD/Nodes/TM/ nl carotid upstrokes bilaterally   LUNGS: no acc muscle use,  Nl contour chest which is clear to A and P though BS  decreased at R base vs L    CV:  IRIR apical pulse around 100  no s3 or murmur or increase in P2, and no edema   ABD:  soft and nontender with nl inspiratory excursion in the supine position. No bruits or organomegaly appreciated, bowel sounds nl  MS:  Nl gait/ ext warm without deformities, calf tenderness, cyanosis or clubbing No obvious joint restrictions   SKIN: warm and dry without lesions    NEURO:  alert, approp, nl sensorium with  no motor or cerebellar deficits apparent.      EKG 04/08/2017   afib rate low 100s    CXR PA and Lateral:   04/08/2017 :    I personally reviewed images and agree with radiology impression as follows:    did not go to xray as req    Labs ordered/ reviewed:      Chemistry      Component Value Date/Time   NA 140 03/18/2017 1144   NA 142 11/04/2016 1334   K 4.2 03/18/2017 1144   K 4.0 11/04/2016 1334   CL 105 03/18/2017 1144   CO2 30 03/18/2017 1144   CO2 26 11/04/2016 1334   BUN 19 03/18/2017 1144   BUN 15.4 11/04/2016 1334   CREATININE 1.02 03/18/2017 1144   CREATININE 1.2 11/04/2016 1334       Component Value Date/Time   CALCIUM 8.7 03/18/2017 1144   CALCIUM 9.3 11/04/2016 1334   ALKPHOS 99 03/18/2017 1144   ALKPHOS 91 11/04/2016 1334   AST 12 03/18/2017 1144   AST 14 11/04/2016 1334   ALT 6 03/18/2017 1144   ALT 10 11/04/2016 1334   BILITOT 0.5 03/18/2017 1144   BILITOT 0.37 11/04/2016 1334  Lab Results  Component Value Date   WBC 7.5 04/08/2017   HGB 12.8 (L) 04/08/2017   HCT 38.2 (L) 04/08/2017   MCV 84.0 04/08/2017   PLT 283 04/08/2017       EOS                       0.9      04/08/2017     Lab Results  Component Value Date   DDIMER 2.80 (H) 04/08/2017      Lab Results  Component Value Date   TSH 0.47 03/18/2017     Lab Results  Component Value Date   PROBNP 164.0 (H) 04/08/2017       Lab Results  Component Value Date   ESRSEDRATE 23 (H) 04/08/2017   ESRSEDRATE 28 (H) 03/27/2012   ESRSEDRATE 9 04/24/2009    Labs ordered 04/08/2017   Allergy profile        Assessment:

## 2017-04-10 ENCOUNTER — Encounter: Payer: Self-pay | Admitting: Internal Medicine

## 2017-04-10 NOTE — Assessment & Plan Note (Addendum)
Allergy profile 04/08/2017 >  Eos 0. /  IgE  Pending - Sinus CT ordered  Most c/w  Upper airway cough syndrome (previously labeled PNDS),  is so named because it's frequently impossible to sort out how much is  CR/sinusitis with freq throat clearing (which can be related to primary GERD)   vs  causing  secondary (" extra esophageal")  GERD from wide swings in gastric pressure that occur with throat clearing, often  promoting self use of mint and menthol lozenges that reduce the lower esophageal sphincter tone and exacerbate the problem further in a cyclical fashion.   These are the same pts (now being labeled as having "irritable larynx syndrome" by some cough centers) who not infrequently have a history of having failed to tolerate ace inhibitors,  dry powder inhalers or biphosphonates or report having atypical/extraesophageal reflux symptoms that don't respond to standard doses of PPI  and are easily confused as having aecopd or asthma flares by even experienced allergists/ pulmonologists (myself included).  Of the three most common causes of  Sub-acute or recurrent or chronic cough, only one (GERD)  can actually contribute to/ trigger  the other two (asthma and post nasal drip syndrome)  and perpetuate the cylce of cough.  While not intuitively obvious, many patients with chronic low grade reflux do not cough until there is a primary insult that disturbs the protective epithelial barrier and exposes sensitive nerve endings.   This is typically viral but can be direct physical injury such as with an endotracheal tube.     The point is that once this occurs, it is difficult to eliminate the cycle  using anything but a maximally effective acid suppression regimen at least in the short run, accompanied by an appropriate diet to address non acid GERD  -  see avs for instructions unique to this ov

## 2017-04-10 NOTE — Assessment & Plan Note (Signed)
Quit smoking 1978 Spirometry 08/26/2016  FEV1 2.38 (91%)  Ratio 73 with mild curvature     When respiratory symptoms begin or become refractory well after a patient reports complete smoking cessation,  Especially when this wasn't the case while they were smoking, a red flag is raised based on the work of Dr Kris Mouton which states:  if you quit smoking when your best day FEV1 is still well preserved it is highly unlikely you will progress to severe disease.  That is to say, once the smoking stops,  the symptoms should not suddenly erupt or markedly worsen.  If so, the differential diagnosis should include  obesity/deconditioning,  LPR/Reflux/Aspiration syndromes,  occult CHF, or  especially side effect of medications commonly used in this population.   No rx for copd needed clinically at this point

## 2017-04-10 NOTE — Assessment & Plan Note (Signed)
See ekg 04/08/2017 >  Referred to cardiology electively since rate controlled s rx

## 2017-04-10 NOTE — Assessment & Plan Note (Addendum)
Symptoms are markedly disproportionate to objective findings and not clear this is actually much of a  lung problem but pt does appear to have difficult to sort out respiratory symptoms of unknown origin for which  DDX  = almost all start with A and  include Adherence, Ace Inhibitors, Acid Reflux, Active Sinus Disease, Alpha 1 Antitripsin deficiency, Anxiety masquerading as Airways dz,  ABPA,  Allergy(esp in young), Aspiration (esp in elderly), Adverse effects of meds,  Active smokers, A bunch of PE's (a small clot burden can't cause this syndrome unless there is already severe underlying pulm or vascular dz with poor reserve),  Anemia and Thyroid dz,  plus two Bs  = Bronchiectasis and Beta blocker use..and one C= CHF    Adherence is always the initial "prime suspect" and is a multilayered concern that requires a "trust but verify" approach in every patient - starting with knowing how to use medications, especially inhalers, correctly, keeping up with refills and understanding the fundamental difference between maintenance and prns vs those medications only taken for a very short course and then stopped and not refilled.  - return with all meds in hand using a trust but verify approach to confirm accurate Medication  Reconciliation The principal here is that until we are certain that the  patients are doing what we've asked, it makes no sense to ask them to do more.    ? Acid (or non-acid) GERD > always difficult to exclude as up to 75% of pts in some series report no assoc GI/ Heartburn symptoms> rec max (24h)  acid suppression and diet restrictions/ reviewed and instructions given in writing.   ? Allergy/asthma > strongly doubt, return for pfts, check allergy profile to be complete  ? Active sinus dz > check sinus CT   ? Anxiety/ cognitive issues  > usually at the bottom of this list of usual suspects but note may be having cns side effects of gabapentin so suggestive trying to taper off and see what  the effects are  ? Anemia or thyroid dz > ruled out today  ? A bunch of PE's > he is at risk and unfortunately the d dimer is only helpful in the setting of chronic dz when it is neg so given moderate increase in D dimer today will need CTa to rule it out  ? chf > unlikely with such little elevation of BNP but doesn't exclude PH which is less likely if CTa is nl  Nor diastolic dysfunction related to afib > refer to cards    I had an extended discussion with the patient reviewing all relevant studies completed to date and  lasting 25 minutes of a 40  Minute acute office  visit to re-establish and evulate new   severe non-specific but potentially very serious refractory respiratory symptoms of uncertain and potentially multiple  etiologies.   Each maintenance medication was reviewed in detail including most importantly the difference between maintenance and prns and under what circumstances the prns are to be triggered using an action plan format that is not reflected in the computer generated alphabetically organized AVS.    Please see AVS for specific instructions unique to this office visit that I personally wrote and verbalized to the the pt in detail and then reviewed with pt  by my nurse highlighting any changes in therapy/plan of care  recommended at today's visit.

## 2017-04-10 NOTE — Assessment & Plan Note (Signed)
rec try to wean gabapentin to see what if any changes in cognition or pain control occur

## 2017-04-11 ENCOUNTER — Telehealth: Payer: Self-pay | Admitting: *Deleted

## 2017-04-11 ENCOUNTER — Telehealth: Payer: Self-pay | Admitting: Internal Medicine

## 2017-04-11 DIAGNOSIS — R0609 Other forms of dyspnea: Principal | ICD-10-CM

## 2017-04-11 DIAGNOSIS — R0602 Shortness of breath: Secondary | ICD-10-CM

## 2017-04-11 LAB — INTERPRETATION:

## 2017-04-11 LAB — RESPIRATORY ALLERGY PROFILE REGION II ~~LOC~~
Allergen, A. alternata, m6: <0.1 kU/L
Allergen, Cedar tree, t12: <0.1 kU/L
Allergen, Comm Silver Birch, t9: <0.1 kU/L
Allergen, Oak,t7: <0.1 kU/L
CLADOSPORIUM HERBARUM (M2) IGE: <0.1 kU/L
CLASS: 0
CLASS: 0
CLASS: 0
CLASS: 0
COMMON RAGWEED (SHORT) (W1) IGE: <0.1 kU/L
Cat Dander: <0.1 kU/L
Class: 0
Class: 0
Class: 0
Class: 0
Class: 0
Class: 0
Class: 0
Class: 0
Class: 0
Class: 0
Class: 0
Class: 0
Class: 0
Class: 0
Class: 0
Class: 0
Class: 0
Class: 0
Class: 0
Class: 0
Dog Dander: <0.1 kU/L
IgE (Immunoglobulin E), Serum: 21 kU/L (ref <=114)
Johnson Grass: <0.1 kU/L
Pecan/Hickory Tree IgE: <0.1 kU/L

## 2017-04-11 LAB — D-DIMER, QUANTITATIVE: D-Dimer, Quant: 2.8 mcg/mL FEU — ABNORMAL HIGH (ref <0.50)

## 2017-04-11 NOTE — Telephone Encounter (Signed)
Called patient, he stated that he forgot what the call was about. He is aware of the CT scan, but he wasn't sure what was needed. Will leave open for follow up.

## 2017-04-11 NOTE — Telephone Encounter (Signed)
-----   Message from Tanda Rockers, MD sent at 04/11/2017  9:22 AM EST ----- CTa chest due to high D dimer needs to be done asap

## 2017-04-11 NOTE — Telephone Encounter (Signed)
LMTCB on home and cell

## 2017-04-11 NOTE — Progress Notes (Signed)
Spouse notified of results  She will inform pt to call  CTA ord

## 2017-04-12 ENCOUNTER — Ambulatory Visit (INDEPENDENT_AMBULATORY_CARE_PROVIDER_SITE_OTHER)
Admission: RE | Admit: 2017-04-12 | Discharge: 2017-04-12 | Disposition: A | Discharge status: Home / Self Care | Payer: Medicare Other | Source: Ambulatory Visit | Attending: Internal Medicine | Admitting: Internal Medicine

## 2017-04-12 DIAGNOSIS — R0602 Shortness of breath: Secondary | ICD-10-CM | POA: Diagnosis not present

## 2017-04-12 DIAGNOSIS — R0609 Other forms of dyspnea: Secondary | ICD-10-CM | POA: Diagnosis not present

## 2017-04-12 MED ORDER — IOPAMIDOL (ISOVUE-370) INJECTION 76%
80.0000 mL | Freq: Once | INTRAVENOUS | Status: AC | PRN
  Administered 2017-04-12: 80 mL via INTRAVENOUS

## 2017-04-12 NOTE — Progress Notes (Signed)
Spoke with pt and notified of results per Dr. Wert. Pt verbalized understanding and denied any questions. 

## 2017-04-19 ENCOUNTER — Other Ambulatory Visit: Payer: Self-pay | Admitting: Internal Medicine

## 2017-04-19 ENCOUNTER — Ambulatory Visit (INDEPENDENT_AMBULATORY_CARE_PROVIDER_SITE_OTHER)
Admission: RE | Admit: 2017-04-19 | Discharge: 2017-04-19 | Disposition: A | Discharge status: Home / Self Care | Payer: Medicare Other | Source: Ambulatory Visit | Attending: Internal Medicine | Admitting: Internal Medicine

## 2017-04-19 DIAGNOSIS — R059 Cough, unspecified: Secondary | ICD-10-CM

## 2017-04-19 DIAGNOSIS — R05 Cough: Secondary | ICD-10-CM | POA: Diagnosis not present

## 2017-04-22 DIAGNOSIS — R05 Cough: Secondary | ICD-10-CM | POA: Diagnosis not present

## 2017-04-22 DIAGNOSIS — J323 Chronic sphenoidal sinusitis: Secondary | ICD-10-CM | POA: Diagnosis not present

## 2017-04-25 ENCOUNTER — Ambulatory Visit (INDEPENDENT_AMBULATORY_CARE_PROVIDER_SITE_OTHER): Payer: Medicare Other | Admitting: Cardiology

## 2017-04-25 ENCOUNTER — Encounter: Payer: Self-pay | Admitting: Cardiology

## 2017-04-25 VITALS — BP 108/60 | HR 103 | Ht 68.0 in | Wt 160.0 lb

## 2017-04-25 DIAGNOSIS — I4891 Unspecified atrial fibrillation: Secondary | ICD-10-CM

## 2017-04-25 MED ORDER — APIXABAN 5 MG PO TABS: 5.0000 mg | ORAL_TABLET | Freq: Two times a day (BID) | ORAL | 3 refills | Status: DC

## 2017-04-25 NOTE — Patient Instructions (Signed)
Medication Instructions:  1. START ELIQUIS 5 MG 1 TABLET TWICE DAILY  Labwork: CBC IN ABOUT 7-14 DAYS   Testing/Procedures: Your physician has requested that you have an echocardiogram. Echocardiography is a painless test that uses sound waves to create images of your heart. It provides your doctor with information about the size and shape of your heart and how well your heart's chambers and valves are working. This procedure takes approximately one hour. There are no restrictions for this procedure.    Follow-Up: DR. Johnsie Cancel 2-3 MONTHS   Any Other Special Instructions Will Be Listed Below (If Applicable).     If you need a refill on your cardiac medications before your next appointment, please call your pharmacy.

## 2017-04-25 NOTE — Progress Notes (Signed)
04/25/2017 Raymond Logan   April 19, 1930  [id number]  Primary Physician Jenny Reichmann Hunt Oris, MD Primary Cardiologist: New (Dr. Johnsie Cancel, [place])   Reason for Visit/CC: New Patient Evaluation for Atrial Fibrillation   HPI:  Raymond Logan is a 84 y.o. male who is being seen today for the evaluation of new onset atrial fibrillation, at the request of Tanda Rockers, MD.  Pt was very active until 2 years ago. He was playing full court basketball at age 43 but quit due to severe left knee OA. During that time, he denied any other limitations with physical activity. No exertional CP or dyspnea. No risk factors for HD. No h/o HTN, HLD or DM. He is a former smoker but quit over 40 years ago. He has no family h/o heart disease.   Pt was recently diagnosed with Lung CA in 2018, adenocarcinoma,  and underwent thoracoscopic right middle lobectomy 10/07/2016. He had a very close vascular margin and underwent adjuvant radiation by Dr. [name]. During the course of radiation he developed a right pleural effusion. After draining the effusion he developed anex vacuo ptx.  Most recently, he developed increased exertional dyspnea. He was playing ping pong and after hitting the ball 2-3 times, he became very SOB and had to stop to rest. He could not finish the game. Dyspnea resolved with rest. No chest pain, palpitations, dizziness, syncope/ near syncope.   Given his symptoms, he was seen by Dr. Melvyn Novas 04/10/17. As part of w/u he had  a chest CT 03/12/17 that was negative for PE. He was noted to have two new nodules in the right lung, 1 in the right lower lobe and 1 along the superior aspect of the right major fissure. Given the patient's history, these will need to be followed to exclude metastatic disease. BNP was mildly elevated at 164. EKG showed new onset atrial fibrillation w/ CVR. He was referred by Dr. Melvyn Novas for further management.   EKG today shows sinus tach with frequent PVCs, 104 bpm. Pt  is resting comfortably. He  denies recurrence of severe dyspnea, like he developed several days ago after playing ping pong. He is able to perform ADLs w/o much difficulty. His main issue has been dry nonproductive cough. He denies LEE. He sleeps with 1 pillow. No PND or orthopnea.    Current Meds  Medication Sig  . aspirin-acetaminophen-caffeine (EXCEDRIN MIGRAINE) 250-250-65 MG tablet Take 2 tablets by mouth daily as needed for headache or migraine.  . calcium carbonate (TUMS - DOSED IN MG ELEMENTAL CALCIUM) 500 MG chewable tablet Chew 1 tablet by mouth 3 (three) times daily as needed (for acid reflux.).  [name] finasteride (PROSCAR) 5 MG tablet Take 5 mg by mouth at bedtime.   . gabapentin (NEURONTIN) 600 MG tablet Take 600 mg by mouth daily.  . silodosin (RAPAFLO) 8 MG CAPS capsule Take 8 mg by mouth at bedtime.   . traMADol (ULTRAM) 50 MG tablet Take 50 mg by mouth every 6 (six) hours as needed for moderate pain.  [name] zolpidem (AMBIEN CR) 12.5 MG CR tablet Take 1 tablet (12.5 mg total) by mouth at bedtime as needed for sleep.   Allergies  Allergen Reactions  . Alfuzosin Other (See Comments)    BP went crazy, dizzy, passing out   . Aricept [Donepezil Hcl] Other (See Comments)    Insomnia, headache  . Penicillins Hives    PATIENT HAS HAD A PCN REACTION WITH IMMEDIATE RASH, FACIAL/TONGUE/THROAT SWELLING, SOB, OR LIGHTHEADEDNESS WITH HYPOTENSION:  #  #  #  YES  #  #  #   Has patient had a PCN reaction causing severe rash involving mucus membranes or skin necrosis:No Has patient had a PCN reaction that required hospitalization:No Has patient had a PCN reaction occurring within the last 10 years:No If all of the above answers are "NO", then may proceed with Cephalosporin use.    Past Medical History:  Diagnosis Date  . ALLERGIC RHINITIS 01/23/2007  . Arthritis    hands  . BENIGN PROSTATIC HYPERTROPHY 09/10/2006  . Chronic headaches   . Complication of anesthesia    difficulty voiding- if cath needed needs to placed  with need to be done with scope  . COPD, MILD 04/03/2010   patient denies on preop visit of 08/02/2014   . Difficulty in urination   . FATIGUE 01/08/2008  . GERD 04/15/2010  . H. pylori infection    hx of   . Headache(784.0) 09/10/2006  . HOARSENESS 04/15/2010  . HYPERLIPIDEMIA 09/10/2006  . INSOMNIA-SLEEP DISORDER-UNSPEC 04/24/2009  . PEPTIC ULCER DISEASE 01/23/2007  . Pneumonia    1955   Family History  Problem Relation Age of Onset  . Prostate cancer Brother   . Alzheimer's disease Mother   . Alzheimer's disease Sister    Past Surgical History:  Procedure Laterality Date  . CATARACT EXTRACTION Left   . COLONOSCOPY    . CYSTOSCOPY N/A 10/07/2016   Procedure: CYSTOSCOPY;  Surgeon: Festus Aloe, MD;  Location: [facility];  Service: Urology;  Laterality: N/A;  . HEMORRHOID BANDING    . INSERTION OF SUPRAPUBIC CATHETER N/A 10/07/2016   Procedure: FOLEY  CATHETER PLACEMENT;  Surgeon: Festus Aloe, MD;  Location: [facility];  Service: Urology;  Laterality: N/A;  . KNEE ARTHROSCOPY Left    torn meniscus  . LUMBAR LAMINECTOMY/DECOMPRESSION MICRODISCECTOMY Right 08/07/2014   Procedure: complete DECOMPRESSION LUMBAR LAMINECTOMY/MICRODISCECTOMY OF L4-L5 for spinal stenosis, DECOMPRESSION OF L3-L4;  Surgeon: Latanya Maudlin, MD;  Location: [facility];  Service: Orthopedics;  Laterality: Right;  . ROTATOR CUFF REPAIR Right   . SHOULDER ARTHROSCOPY     x3, L & R  . VIDEO ASSISTED THORACOSCOPY (VATS)/ LOBECTOMY Right 10/07/2016   Procedure: VIDEO ASSISTED THORACOSCOPY (VATS)/RIGHT MIDDLE LOBECTOMY;  Surgeon: [name], MD;  Location: [facility];  Service: Thoracic;  Laterality: Right;  [name] VIDEO BRONCHOSCOPY WITH ENDOBRONCHIAL NAVIGATION N/A 09/23/2016   Procedure: VIDEO BRONCHOSCOPY WITH ENDOBRONCHIAL NAVIGATION;  Surgeon: [name], MD;  Location: [facility];  Service: Thoracic;  Laterality: N/A;  . VIDEO BRONCHOSCOPY WITH ENDOBRONCHIAL ULTRASOUND N/A 09/23/2016   Procedure: VIDEO BRONCHOSCOPY  WITH ENDOBRONCHIAL ULTRASOUND;  Surgeon: [name], MD;  Location: [facility];  Service: Thoracic;  Laterality: N/A;   Social History   Socioeconomic History  . Marital status: Married    Spouse name: Not on file  . Number of children: 3  . Years of education: Not on file  . Highest education level: Not on file  Social Needs  . Financial resource strain: Not on file  . Food insecurity - worry: Not on file  . Food insecurity - inability: Not on file  . Transportation needs - medical: Not on file  . Transportation needs - non-medical: Not on file  Occupational History  . Occupation: Retired    Fish farm manager: RETIRED  Tobacco Use  . Smoking status: Former Smoker    Packs/day: 1.00    Years: 23.00    Pack years: 23.00    Last attempt to quit: 1978    Years since  quitting: 41.1  . Smokeless tobacco: Never Used  Substance and Sexual Activity  . Alcohol use: No    Comment: rare  . Drug use: No  . Sexual activity: Not on file  Other Topics Concern  . Not on file  Social History Narrative  . Not on file     Review of Systems: General: negative for chills, fever, night sweats or weight changes.  Cardiovascular: negative for chest pain, dyspnea on exertion, edema, orthopnea, palpitations, paroxysmal nocturnal dyspnea or shortness of breath Dermatological: negative for rash Respiratory: negative for cough or wheezing Urologic: negative for hematuria Abdominal: negative for nausea, vomiting, diarrhea, bright red blood per rectum, melena, or hematemesis Neurologic: negative for visual changes, syncope, or dizziness All other systems reviewed and are otherwise negative except as noted above.   Physical Exam:  Blood pressure 108/60, pulse (!) 103, height 5\' 8"  (1.727 m), weight 160 lb (72.6 kg), SpO2 98 %.  General appearance: alert, cooperative, no distress and Neck: no carotid bruit and no JVD Lungs: clear to auscultation bilaterally Heart: regular rate and rhythm, S1, S2  normal, no murmur, click, rub or gallop Extremities: extremities normal, atraumatic, no cyanosis or edema Pulses: 2+ and symmetric Skin: Skin color, texture, turgor normal. No rashes or lesions Neurologic: Grossly normal  EKG Sinus tach 104 bpm -- personally reviewed   ASSESSMENT AND PLAN:   1. PAF: noted to have newly recognized atrial fibrillation on EKG 04/08/17 w/ CVR. He is in NSR today by EKG with some PVCs. He is asymptomatic. No resting dyspnea. He hopes to return to playing ping pong ball later this week. He will call us if he has any limitations or recurrent exertional dyspnea. Pt notes h/o soft/low BPs historically. SBP is in the low 100s today. Will hold off on rate control therapy for now. If difficulties with symptoms/ elevated HRs then we will revisit the idea of starting something for rate control. I have reviewed pt case with Dr. Johnsie Cancel, DOD. Given CHA2DS2 VASc score of 2 for Age > 75, he has recommended initiation of DOAC. We will start Eliquis. Based on age, weight and renal function, he can take full dose, 5 mg BID. I have recommended avoidance of PRN Excedrin and ibuprofen. He can take Tylenol PRN for arthritic pain. We will check a f/u CBC in 1-2 weeks after starting Eliquis to ensure H/H remains stable. Baseline Hgb is 12. We will also obtain a 2 D echo to assess cardiac structure and function. F/u with Dr. Johnsie Cancel in 2-3 months. Pt will contact our office if any symptoms of palpitations, increased exertional dyspnea, CP or dizziness. He will also notify us if any abnormal bleeding occurs.    Follow-Up w/ Dr. Johnsie Cancel in 2-3 months or sooner if symptoms develop.   Aleene Swanner Ladoris Gene, MHS [facility] 04/25/2017 9:22 AM

## 2017-04-26 ENCOUNTER — Ambulatory Visit
Admission: RE | Admit: 2017-04-26 | Discharge: 2017-04-26 | Disposition: A | Discharge status: Home / Self Care | Payer: Medicare Other | Source: Ambulatory Visit | Attending: Internal Medicine | Admitting: Internal Medicine

## 2017-04-26 DIAGNOSIS — R0609 Other forms of dyspnea: Principal | ICD-10-CM

## 2017-04-29 ENCOUNTER — Telehealth: Payer: Self-pay | Admitting: *Deleted

## 2017-04-29 NOTE — Telephone Encounter (Signed)
Oncology Nurse Navigator Documentation  Oncology Nurse Navigator Flowsheets 04/29/2017  Navigator Location [facility]  Navigator Encounter Type Telephone/I checked on MR. Mcgarvey's schedule. He needs CT scan before he sees Mikey Bussing NP next week. I spoke with the wife and she asked that I call back later.    Telephone Outgoing Call  Treatment Phase Follow-up  Barriers/Navigation Needs Coordination of Care  Interventions Coordination of Care  Coordination of Care Other  Acuity Level 1  Time Spent with Patient 15

## 2017-05-02 ENCOUNTER — Other Ambulatory Visit: Payer: Medicare Other | Admitting: *Deleted

## 2017-05-02 ENCOUNTER — Telehealth: Payer: Self-pay | Admitting: Cardiovascular Disease

## 2017-05-02 ENCOUNTER — Other Ambulatory Visit: Payer: Self-pay

## 2017-05-02 ENCOUNTER — Ambulatory Visit (HOSPITAL_COMMUNITY): Payer: Medicare Other | Attending: Cardiology

## 2017-05-02 DIAGNOSIS — J449 Chronic obstructive pulmonary disease, unspecified: Secondary | ICD-10-CM | POA: Diagnosis not present

## 2017-05-02 DIAGNOSIS — E785 Hyperlipidemia, unspecified: Secondary | ICD-10-CM | POA: Insufficient documentation

## 2017-05-02 DIAGNOSIS — I4891 Unspecified atrial fibrillation: Secondary | ICD-10-CM | POA: Insufficient documentation

## 2017-05-02 LAB — CBC
HEMATOCRIT: 32.1 % — AB (ref 37.5–51.0)
HEMOGLOBIN: 11 g/dL — AB (ref 13.0–17.7)
MCH: 28.9 pg (ref 26.6–33.0)
MCHC: 34.3 g/dL (ref 31.5–35.7)
MCV: 84 fL (ref 79–97)
PLATELETS: 314 10*3/uL (ref 150–379)
RBC: 3.81 x10E6/uL — AB (ref 4.14–5.80)
RDW: 14.2 % (ref 12.3–15.4)
WBC: 6 10*3/uL (ref 3.4–10.8)

## 2017-05-02 NOTE — Telephone Encounter (Signed)
Spoke with patient's daughter who is concerned about her father's Afib and occasional SOB.   She is concerned because her father does not have an appt until (4/29) and did not know if it should be moved up?  He had an echo today (3/4) and labs drawn..  Please advise, thank you

## 2017-05-02 NOTE — Telephone Encounter (Signed)
Please call daughter [name] [phone] for all information

## 2017-05-02 NOTE — Telephone Encounter (Signed)
[name] calling,   States that she would like to discuss plan for treatment. She is concerned about patient AFIB symptoms.

## 2017-05-03 ENCOUNTER — Telehealth: Payer: Self-pay | Admitting: *Deleted

## 2017-05-03 DIAGNOSIS — D649 Anemia, unspecified: Secondary | ICD-10-CM

## 2017-05-03 NOTE — Progress Notes (Signed)
Pt has been made aware of normal result and verbalized understanding.  jw 05/03/17

## 2017-05-03 NOTE — Telephone Encounter (Signed)
Called pt regarding appts. CT Scan has been scheduled for 3/11, pt to expect a call from scheduling regarding his lab/NP/MD follow up appt. Pt verbalized understanding.

## 2017-05-03 NOTE — Telephone Encounter (Signed)
-----   Message from Consuelo Pandy, [place] sent at 05/02/2017  4:39 PM EST ----- Slight drop in Hgb after starting Eliquis. Please check with pt to see if he has noticed any bleeding with bowel movements (?dark tarry stools or bright red blood) ? Any other abnormal bleeding (? Urine, nose bleeds, coughing up or vomiting blood). I recommend that we closely follow. Recheck H/H in 7  days. Notify our office if he notices any abnormal bleeding.

## 2017-05-03 NOTE — Telephone Encounter (Signed)
I have never seen patient He was in NSR when seen by PA and echo is normal He is SOB from his lung Cancer ok to wait until April for f/u If he has more dyspnea should see Wert sooner.

## 2017-05-03 NOTE — Telephone Encounter (Signed)
Spoke with patient's daughter [name]) in regards to her father's most recent symptoms..  I gave her Dr. Kyla Balzarine recommendations and she verbalized understanding.[name]

## 2017-05-04 ENCOUNTER — Ambulatory Visit: Payer: Medicare Other | Admitting: Oncology

## 2017-05-04 ENCOUNTER — Ambulatory Visit: Payer: Self-pay

## 2017-05-04 ENCOUNTER — Other Ambulatory Visit: Payer: Medicare Other

## 2017-05-04 ENCOUNTER — Telehealth: Payer: Self-pay | Admitting: Internal Medicine

## 2017-05-04 NOTE — Telephone Encounter (Signed)
Rescheduled appts per 3/5 sch msg - spoke with patient regarding appts.

## 2017-05-04 NOTE — Telephone Encounter (Signed)
Wife reports pt. Has been coughing "awhile, but it is getting worse." Denies fever. Appointment made for tomorrow.  Reason for Disposition . SEVERE coughing spells (e.g., whooping sound after coughing, vomiting after coughing)  Answer Assessment - Initial Assessment Questions 1. ONSET: "When did the cough begin?"      Started 2 months ago but has gotten worse 2. SEVERITY: "How bad is the cough today?"      Severe short of breath 3. RESPIRATORY DISTRESS: "Describe your breathing."      Gets  4. FEVER: "Do you have a fever?" If so, ask: "What is your temperature, how was it measured, and when did it start?"     No 5. HEMOPTYSIS: "Are you coughing up any blood?" If so ask: "How much?" (flecks, streaks, tablespoons, etc.)     No 6. TREATMENT: "What have you done so far to treat the cough?" (e.g., meds, fluids, humidifier)     COUGH DROPS 7. CARDIAC HISTORY: "Do you have any history of heart disease?" (e.g., heart attack, congestive heart failure)      A-FIB 8. LUNG HISTORY: "Do you have any history of lung disease?"  (e.g., pulmonary embolus, asthma, emphysema)     Yes 9. PE RISK FACTORS: "Do you have a history of blood clots?" (or: recent major surgery, recent prolonged travel, bedridden )     No 10. OTHER SYMPTOMS: "Do you have any other symptoms? (e.g., runny nose, wheezing, chest pain)       Wheezing 11. PREGNANCY: "Is there any chance you are pregnant?" "When was your last menstrual period?"       No 12. TRAVEL: "Have you traveled out of the country in the last month?" (e.g., travel history, exposures)       No  Protocols used: COUGH - ACUTE NON-PRODUCTIVE-A-AH

## 2017-05-05 ENCOUNTER — Telehealth: Payer: Self-pay | Admitting: Medical Oncology

## 2017-05-05 ENCOUNTER — Encounter: Payer: Self-pay | Admitting: Internal Medicine

## 2017-05-05 ENCOUNTER — Ambulatory Visit (INDEPENDENT_AMBULATORY_CARE_PROVIDER_SITE_OTHER): Payer: Medicare Other | Admitting: Internal Medicine

## 2017-05-05 VITALS — BP 104/56 | HR 110 | Temp 97.9°F | Resp 20 | Ht 68.0 in | Wt 157.8 lb

## 2017-05-05 DIAGNOSIS — C342 Malignant neoplasm of middle lobe, bronchus or lung: Secondary | ICD-10-CM

## 2017-05-05 DIAGNOSIS — J189 Pneumonia, unspecified organism: Secondary | ICD-10-CM

## 2017-05-05 MED ORDER — CEFDINIR 300 MG PO CAPS: 300.0000 mg | ORAL_CAPSULE | Freq: Two times a day (BID) | ORAL | 0 refills | Status: DC

## 2017-05-05 MED ORDER — DEXTROMETHORPHAN POLISTIREX ER 30 MG/5ML PO SUER: 30.0000 mg | Freq: Two times a day (BID) | ORAL | 5 refills | Status: DC

## 2017-05-05 NOTE — Patient Instructions (Signed)
Cough, Adult  Coughing is a reflex that clears your throat and your airways. Coughing helps to heal and protect your lungs. It is normal to cough occasionally, but a cough that happens with other symptoms or lasts a long time may be a sign of a condition that needs treatment. A cough may last only 2-3 weeks (acute), or it may last longer than 8 weeks (chronic).  What are the causes?  Coughing is commonly caused by:   Breathing in substances that irritate your lungs.   A viral or bacterial respiratory infection.   Allergies.   Asthma.   Postnasal drip.   Smoking.   Acid backing up from the stomach into the esophagus (gastroesophageal reflux).   Certain medicines.   Chronic lung problems, including COPD (or rarely, lung cancer).   Other medical conditions such as heart failure.    Follow these instructions at home:  Pay attention to any changes in your symptoms. Take these actions to help with your discomfort:   Take medicines only as told by your health care provider.  ? If you were prescribed an antibiotic medicine, take it as told by your health care provider. Do not stop taking the antibiotic even if you start to feel better.  ? Talk with your health care provider before you take a cough suppressant medicine.   Drink enough fluid to keep your urine clear or pale yellow.   If the air is dry, use a cold steam vaporizer or humidifier in your bedroom or your home to help loosen secretions.   Avoid anything that causes you to cough at work or at home.   If your cough is worse at night, try sleeping in a semi-upright position.   Avoid cigarette smoke. If you smoke, quit smoking. If you need help quitting, ask your health care provider.   Avoid caffeine.   Avoid alcohol.   Rest as needed.    Contact a health care provider if:   You have new symptoms.   You cough up pus.   Your cough does not get better after 2-3 weeks, or your cough gets worse.   You cannot control your cough with suppressant  medicines and you are losing sleep.   You develop pain that is getting worse or pain that is not controlled with pain medicines.   You have a fever.   You have unexplained weight loss.   You have night sweats.  Get help right away if:   You cough up blood.   You have difficulty breathing.   Your heartbeat is very fast.  This information is not intended to replace advice given to you by your health care provider. Make sure you discuss any questions you have with your health care provider.  Document Released: 08/14/2010 Document Revised: 07/24/2015 Document Reviewed: 04/24/2014  [employer]vier Interactive Patient Education  2018 [employer]

## 2017-05-05 NOTE — Progress Notes (Signed)
Subjective:  Patient ID: Raymond Logan, male    DOB: 28-Jun-1930  Age: 84 y.o. MRN: [id number]  CC: COPD  NEW TO ME  HPI Raymond Logan presents for concerns about a several week history of worsening cough that is productive of thick yellow-green phlegm.  He underwent a lung resection several months ago for adenocarcinoma.  He is followed by pulmonary, CT surgery, as well as primary care.  He had a CT scan done about a month ago that showed concerns for new metastatic lesions on the right side.  The patient says he was not aware of these results.  The patient called his daughter on his phone today and she said she was not aware that his recent CT scan was abnormal.  She said it was their assumption that all of the lung cancer had been removed and there was no sign of recurrence.  It looks like he is scheduled for another CT scan in about 5 days with the patient and his daughter said they were not aware of that either.  He is taking Tessalon to control the cough but he says that has not helped much.  Outpatient Medications Prior to Visit  Medication Sig Dispense Refill  . apixaban (ELIQUIS) 5 MG TABS tablet Take 1 tablet (5 mg total) by mouth 2 (two) times daily. 180 tablet 3  . aspirin-acetaminophen-caffeine (EXCEDRIN MIGRAINE) 939-030-09 MG tablet Take 2 tablets by mouth daily as needed for headache or migraine.    . benzonatate (TESSALON) 100 MG capsule     . calcium carbonate (TUMS - DOSED IN MG ELEMENTAL CALCIUM) 500 MG chewable tablet Chew 1 tablet by mouth 3 (three) times daily as needed (for acid reflux.).    [name] finasteride (PROSCAR) 5 MG tablet Take 5 mg by mouth at bedtime.     . gabapentin (NEURONTIN) 600 MG tablet Take 600 mg by mouth daily.    . silodosin (RAPAFLO) 8 MG CAPS capsule Take 8 mg by mouth at bedtime.     . traMADol (ULTRAM) 50 MG tablet Take 50 mg by mouth every 6 (six) hours as needed for moderate pain.    [name] zolpidem (AMBIEN CR) 12.5 MG CR tablet Take 1 tablet (12.5  mg total) by mouth at bedtime as needed for sleep. 90 tablet 1   No facility-administered medications prior to visit.     ROS Review of Systems  Constitutional: Positive for unexpected weight change (wt loss). Negative for appetite change, chills, diaphoresis, fatigue and fever.  HENT: Negative.  Negative for sinus pressure and trouble swallowing.   Eyes: Negative.   Respiratory: Positive for cough and shortness of breath. Negative for chest tightness and wheezing.   Cardiovascular: Negative for chest pain, palpitations and leg swelling.  Gastrointestinal: Negative for abdominal pain, constipation, diarrhea, nausea and vomiting.  Endocrine: Negative.   Genitourinary: Negative.  Negative for difficulty urinating and urgency.  Musculoskeletal: Negative for back pain and myalgias.  Skin: Negative.  Negative for color change and pallor.  Allergic/Immunologic: Negative.   Neurological: Negative.  Negative for dizziness, weakness, light-headedness and numbness.  Hematological: Negative for adenopathy. Does not bruise/bleed easily.  Psychiatric/Behavioral: Negative.     Objective:  BP (!) 104/56 (BP Location: Left Arm, Patient Position: Sitting, Cuff Size: Normal)   Pulse (!) 110   Temp 97.9 F (36.6 C) (Oral)   Resp 20   Ht 5\' 8"  (1.727 m)   Wt 157 lb 12 oz (71.6 kg)   SpO2 98%  BMI 23.99 kg/m   BP Readings from Last 3 Encounters:  05/05/17 (!) 104/56  04/25/17 108/60  04/08/17 (!) 84/58    Wt Readings from Last 3 Encounters:  05/05/17 157 lb 12 oz (71.6 kg)  04/25/17 160 lb (72.6 kg)  04/08/17 154 lb (69.9 kg)    Physical Exam  Constitutional: He is oriented to person, place, and time. No distress.  HENT:  Mouth/Throat: Oropharynx is clear and moist. No oropharyngeal exudate.  Eyes: Conjunctivae are normal. Left eye exhibits no discharge. No scleral icterus.  Neck: Normal range of motion. Neck supple. No JVD present. No thyromegaly present.  Cardiovascular: Normal  rate, regular rhythm and normal heart sounds. Exam reveals no gallop.  No murmur heard. Pulmonary/Chest: Effort normal and breath sounds normal. No respiratory distress. He has no wheezes. He has no rales.  Abdominal: Soft. Bowel sounds are normal. He exhibits no distension and no mass. There is no tenderness. There is no guarding.  Musculoskeletal: Normal range of motion. He exhibits no edema, tenderness or deformity.  Lymphadenopathy:    He has no cervical adenopathy.  Neurological: He is alert and oriented to person, place, and time.  Skin: Skin is warm and dry. No rash noted. He is not diaphoretic. No erythema.  Vitals reviewed.   Lab Results  Component Value Date   WBC 6.0 05/02/2017   HGB 11.0 (L) 05/02/2017   HCT 32.1 (L) 05/02/2017   PLT 314 05/02/2017   GLUCOSE 99 03/18/2017   CHOL 169 03/18/2017   TRIG 122.0 03/18/2017   HDL 35.90 (L) 03/18/2017   LDLDIRECT 134.4 03/27/2010   LDLCALC 108 (H) 03/18/2017   ALT 6 03/18/2017   AST 12 03/18/2017   NA 140 03/18/2017   K 4.2 03/18/2017   CL 105 03/18/2017   CREATININE 1.02 03/18/2017   BUN 19 03/18/2017   CO2 30 03/18/2017   TSH 0.47 03/18/2017   PSA 1.91 03/18/2017   INR 1.07 10/05/2016   HGBA1C 5.8 03/18/2017   Ct Angio Chest W/cm &/or Wo Cm  Result Date: 04/12/2017 CLINICAL DATA:  Increased shortness of breath with exertion for several weeks. Previous right middle lobectomy for cancer. Thirty-three radiation treatments. EXAM: CT ANGIOGRAPHY CHEST WITH CONTRAST TECHNIQUE: Multidetector CT imaging of the chest was performed using the standard protocol during bolus administration of intravenous contrast. Multiplanar CT image reconstructions and MIPs were obtained to evaluate the vascular anatomy. CONTRAST:  54mL ISOVUE-370 IOPAMIDOL (ISOVUE-370) INJECTION 76% COMPARISON:  Chest x-ray dated 03/08/2017 and CT scan dated 08/17/2016 FINDINGS: Cardiovascular: Satisfactory opacification of the pulmonary arteries to the segmental  level. No evidence of pulmonary embolism. Normal heart size. No pericardial effusion. Mediastinum/Nodes: There is an enlarged aortopulmonary window best seen on image 33 of series 4 measuring 11 mm, increased since the prior CT scan. There is also a 15 mm subcarinal lymph node seen on image 47 of series 4 which is new since the prior study. There is increased soft tissue density around the right hilum extending into the right upper and lower lobes consistent with radiation fibrosis. Some of this fibrosis narrows branches of the right pulmonary artery. Lungs/Pleura: There is linear radiation fibrosis in the superior aspect of the right lower lobe with thickening of the major fissure with some peribronchial thickening in the upper and lower lobes on the right. Right middle lobe has been resected. There is a new 5 x 7 mm nodule on the posterosuperior aspect of the right major fissure best seen on image 62  of series 10 and image 29 of series 6. There is a new 6 mm nodule in the right lower lobe best seen on image 61 of series 6 and image 70 of series 9 and image 41 of series 10. There is a small right pleural effusion. The left lung is clear except for small nodule along the left major fissure on image 40 of series 6 and image 134 of series 8, unchanged since the prior study. Upper Abdomen: No acute abnormality. Musculoskeletal: No chest wall abnormality. No acute or significant osseous findings. Review of the MIP images confirms the above findings. IMPRESSION: 1. No evidence of pulmonary emboli. 2. Radiation fibrosis changes in the right upper and lower lobes and in the right hilum. 3. New slightly enlarged aortopulmonary window and subcarinal lymph nodes. These could be reactive but I cannot exclude metastasis. 4. Two new nodules in the right lung, 1 in the right lower lobe and 1 along the superior aspect of the right major fissure. Given the patient's history, these will need to be followed to exclude metastatic  disease. Electronically Signed   By: Lorriane Shire M.D.   On: 04/12/2017 09:30   Ct Maxillofacial Ltd Wo Cm  Result Date: 04/19/2017 CLINICAL DATA:  Persistent cough for months EXAM: CT PARANASAL SINUS LIMITED WITHOUT CONTRAST TECHNIQUE: Non-contiguous multidetector CT images of the paranasal sinuses were obtained in a single plane without contrast. COMPARISON:  Brain MRI 12/22/2016 and earlier. FINDINGS: The majority of the covered left sphenoid sinus is opacified by material that has a rounded shape. This correlates with T2 hypointense and T1 hyperintense material on 2018 brain MRI, likely inspissated secretions/debris. The other paranasal sinuses are clear. The nasal septum is essentially midline. No significant intracranial, orbital, or soft tissue finding. IMPRESSION: 1. Chronic left sphenoid sinusitis that developed between 2006 and 2013 brain MRIs. 2. The other paranasal sinuses are clear. Electronically Signed   By: Monte Fantasia M.D.   On: 04/19/2017 12:58    No results found.  Assessment & Plan:   Rayhaan was seen today for copd.  Diagnoses and all orders for this visit:  Malignant neoplasm of middle lobe of right lung [employer])- He has another scan ordered for next Monday.  He and his daughter were not aware of the date or time for that.  My assistant Adelina Mings worked with them to make sure that they both have the information for the time and location of the follow-up scan.  After those results are in he and his daughter will discuss this with either his primary care, pulmonologist, or CT surgeon.  Pneumonia of left lung due to infectious organism, unspecified part of lung- I will treat the infection with Cefdinir and will try to control the cough with dextromethorphan. -     dextromethorphan (DELSYM) 30 MG/5ML liquid; Take 5 mLs (30 mg total) by mouth 2 (two) times daily. -     cefdinir (OMNICEF) 300 MG capsule; Take 1 capsule (300 mg total) by mouth 2 (two) times daily.   I am having  Kayvon P. Essman start on dextromethorphan and cefdinir. I am also having him maintain his finasteride, silodosin, aspirin-acetaminophen-caffeine, calcium carbonate, zolpidem, traMADol, gabapentin, apixaban, and benzonatate.  Meds ordered this encounter  Medications  . dextromethorphan (DELSYM) 30 MG/5ML liquid    Sig: Take 5 mLs (30 mg total) by mouth 2 (two) times daily.    Dispense:  89 mL    Refill:  5  . cefdinir (OMNICEF) 300 MG capsule  Sig: Take 1 capsule (300 mg total) by mouth 2 (two) times daily.    Dispense:  20 capsule    Refill:  0     Follow-up: Return if symptoms worsen or fail to improve.  Scarlette Calico, MD

## 2017-05-05 NOTE — Telephone Encounter (Signed)
Pt.notified

## 2017-05-06 ENCOUNTER — Ambulatory Visit (INDEPENDENT_AMBULATORY_CARE_PROVIDER_SITE_OTHER): Payer: Medicare Other | Admitting: Internal Medicine

## 2017-05-06 ENCOUNTER — Encounter: Payer: Self-pay | Admitting: Internal Medicine

## 2017-05-06 VITALS — BP 106/58 | HR 80 | Ht 70.0 in | Wt 156.0 lb

## 2017-05-06 DIAGNOSIS — J449 Chronic obstructive pulmonary disease, unspecified: Secondary | ICD-10-CM | POA: Diagnosis not present

## 2017-05-06 DIAGNOSIS — R0609 Other forms of dyspnea: Secondary | ICD-10-CM

## 2017-05-06 DIAGNOSIS — R05 Cough: Secondary | ICD-10-CM

## 2017-05-06 DIAGNOSIS — R058 Other specified cough: Secondary | ICD-10-CM

## 2017-05-06 LAB — PULMONARY FUNCTION TEST
DL/VA % pred: 69 %
DL/VA: 3.16 ml/min/mmHg/L
DLCO unc % pred: 42 %
DLCO unc: 13.84 ml/min/mmHg
FEF 25-75 Post: 2.14 L/sec
FEF 25-75 Pre: 1.6 L/sec
FEF2575-%CHANGE-POST: 33 %
FEF2575-%Pred-Post: 122 %
FEF2575-%Pred-Pre: 91 %
FEV1-%CHANGE-POST: 9 %
FEV1-%PRED-POST: 82 %
FEV1-%Pred-Pre: 75 %
FEV1-POST: 2.21 L
FEV1-PRE: 2.03 L
FEV1FVC-%CHANGE-POST: 4 %
FEV1FVC-%Pred-Pre: 106 %
FEV6-%CHANGE-POST: 4 %
FEV6-%PRED-PRE: 75 %
FEV6-%Pred-Post: 78 %
FEV6-PRE: 2.67 L
FEV6-Post: 2.78 L
FEV6FVC-%Change-Post: 0 %
FEV6FVC-%PRED-PRE: 107 %
FEV6FVC-%Pred-Post: 107 %
FVC-%CHANGE-POST: 4 %
FVC-%PRED-PRE: 70 %
FVC-%Pred-Post: 73 %
FVC-POST: 2.82 L
FVC-PRE: 2.69 L
POST FEV6/FVC RATIO: 100 %
PRE FEV6/FVC RATIO: 100 %
Post FEV1/FVC ratio: 79 %
Pre FEV1/FVC ratio: 75 %
RV % PRED: 80 %
RV: 2.23 L
TLC % pred: 72 %
TLC: 5.14 L

## 2017-05-06 NOTE — Progress Notes (Signed)
Subjective:     Patient ID: Raymond Logan, male   DOB: 05/23/1930,    MRN: [id number]    Brief patient profile:  47 yowm quit smoking in 1978   From [place]  graduated fom [employer] in [place],  very active including full court basketball until L knee slowed him down about 2017 and planned L TKR but preop cxr showed lung mass so referred to pulmonary clinic 08/26/2016 by Dr   Mayer Camel for pulmonary eval.     History of Present Illness  08/26/2016 1st [facility] office visit/ [name]   Chief Complaint  Patient presents with  . consult    Pt sees Dr. Mayer Camel was scheduled left knee surgery for August 23 2016, had routine pre otp for surgery conducted chest Xray, than sent for CT scan and PET yesterday. Concerned regarding xray results, also thinks t may have UTI. Pt is normally active, breathing is good exercises daily.   Not limited by breathing from desired activities   rec thoracoscopic right middle lobectomy on 10/07/2016 for a stage IA3 (T1c, N0, M0) adenocarcinoma.  He had a very close vascular margin and underwent adjuvant radiation by Dr. [name].  During the course of radiation he developed a right pleural effusion.  After draining the effusion he developed anex vacuo ptx    04/08/2017  Extended acute  ov/Secily Walthour re: new cough and sob  Chief Complaint  Patient presents with  . Acute Visit    Increased cough and SOB over the past month. His cough is non prod. He states he gets SOB with any exertion such as just taking a shower or getting up from a sitting position.   indolent onset progressively worse Dyspnea:   MMRC3 = can't walk 100 yards even at a slow pace at a flat grade s stopping due to sob   also limited by knee and coughing  Cough: better if breath thru nose / worse x one month non prod Sleep: able to lie flat   rec Change the gabpentin to one twice daily and if thinking doesn't improve then reduce to one half twice daily  Call your bladder doctor for follow up as you may need  adjustment on your rapaflo  Pantoprazole (protonix) 40 mg   Take  30-60 min before first meal of the day and Pepcid (famotidine)  20 mg one @  bedtime until return to office .   GERD  Diet    refer to cards > was in NSR at NP ov 04/25/17  with echo only c/w GI diastolic dysfunction  08/01/67  Refer to ENT: shoemaker 04/22/17  "Limited Sinus CT scan from 04/19/17 showed isolated left sphenoid opacification. Given his history and findings I recommended treating for chronic sinusitis. Patient prescribed Omnicef 300 mg twice daily for 10 days, saline nasal spray and fluticasone nightly. Follow-up in 1 month for recheck with repeat CT scan of the sinuses. If he is unable to clear his obstruction and continues to have symptoms he may require endoscopic sinus surgery"     05/06/2017  f/u ov/[name] re:  Unexplained sob with UACS  Chief Complaint  Patient presents with  . Follow-up    PFT's today, shortness of breath, cough (non-productive)  Dyspnea:  Still MMRC3 = can't walk 100 yards even at a slow pace at a flat grade s stopping due to sob    Cough: min daytime with urge to clear throat whenever awake x months  Sleep: fine Confused with meds/ details  of care "no one told me I had afib"  And "ENT eval was neg"   Note neuro eval [facility] 03/11/17 "mild cognitive improvement but memory improving so no rx rec"   No obvious day to day or daytime variability or assoc excess/ purulent sputum or mucus plugs or hemoptysis or cp or chest tightness, subjective wheeze or overt sinus or hb symptoms. No unusual exposure hx or h/o childhood pna/ asthma or knowledge of premature birth.  Sleeping ok flat without nocturnal  or early am exacerbation  of respiratory  c/o's or need for noct saba. Also denies any obvious fluctuation of symptoms with weather or environmental changes or other aggravating or alleviating factors except as outlined above   Current Allergies, Complete Past Medical History, Past Surgical History, Family  History, and Social History were reviewed in [employer] record.  ROS  The following are not active complaints unless bolded Hoarseness, sore throat, dysphagia, dental problems, itching, sneezing,  nasal congestion or discharge of excess mucus or purulent secretions, ear ache,   fever, chills, sweats, unintended wt loss or wt gain, classically pleuritic or exertional cp,  orthopnea pnd or leg swelling, presyncope, palpitations, abdominal pain, anorexia, nausea, vomiting, diarrhea  or change in bowel habits or change in bladder habits, change in stools or change in urine, dysuria, hematuria,  rash, arthralgias, visual complaints, headache, numbness, weakness or ataxia or problems with walking or coordination,  change in mood/affect or memory.        Current Meds  Medication Sig  . apixaban (ELIQUIS) 5 MG TABS tablet Take 1 tablet (5 mg total) by mouth 2 (two) times daily.  [name] aspirin-acetaminophen-caffeine (EXCEDRIN MIGRAINE) 250-250-65 MG tablet Take 2 tablets by mouth daily as needed for headache or migraine.  . calcium carbonate (TUMS - DOSED IN MG ELEMENTAL CALCIUM) 500 MG chewable tablet Chew 1 tablet by mouth 3 (three) times daily as needed (for acid reflux.).  [name] dextromethorphan (DELSYM) 30 MG/5ML liquid Take 5 mLs (30 mg total) by mouth 2 (two) times daily.  . finasteride (PROSCAR) 5 MG tablet Take 5 mg by mouth at bedtime.   . silodosin (RAPAFLO) 8 MG CAPS capsule Take 8 mg by mouth at bedtime.   . traMADol (ULTRAM) 50 MG tablet Take 50 mg by mouth every 6 (six) hours as needed for moderate pain.  . [  gabapentin (NEURONTIN) 600 MG tablet Take 600 mg by mouth daily.               Objective:   Physical Exam  amb hoarse wm nad   05/06/2017         156  04/08/2017         154   08/26/16 163 lb 3.2 oz (74 kg)  08/18/16 165 lb (74.8 kg)  08/12/16 161 lb (73 kg)      Vital signs reviewed - Note on arrival 02 sats  98% on  RA     HEENT: nl dentition,  turbinates bilaterally, and oropharynx. Nl external ear canals without cough reflex   NECK :  without JVD/Nodes/TM/ nl carotid upstrokes bilaterally   LUNGS: no acc muscle use,  Nl contour chest which is clear to A and P bilaterally without cough on insp or exp maneuvers   CV:    IRIR rate 80  no s3 or murmur or increase in P2, and no edema   ABD:  soft and nontender with nl inspiratory excursion in the supine position. No bruits or  organomegaly appreciated, bowel sounds nl  MS:  Nl gait/ ext warm without deformities, calf tenderness, cyanosis or clubbing No obvious joint restrictions   SKIN: warm and dry without lesions    NEURO:  alert, approp, nl sensorium with  no motor or cerebellar deficits apparent.          Labs  Reviewed 05/06/2017       Chemistry      Component Value Date/Time   NA 140 03/18/2017 1144   NA 142 11/04/2016 1334   K 4.2 03/18/2017 1144   K 4.0 11/04/2016 1334   CL 105 03/18/2017 1144   CO2 30 03/18/2017 1144   CO2 26 11/04/2016 1334   BUN 19 03/18/2017 1144   BUN 15.4 11/04/2016 1334   CREATININE 1.02 03/18/2017 1144   CREATININE 1.2 11/04/2016 1334      Component Value Date/Time   CALCIUM 8.7 03/18/2017 1144   CALCIUM 9.3 11/04/2016 1334   ALKPHOS 99 03/18/2017 1144   ALKPHOS 91 11/04/2016 1334   AST 12 03/18/2017 1144   AST 14 11/04/2016 1334   ALT 6 03/18/2017 1144   ALT 10 11/04/2016 1334   BILITOT 0.5 03/18/2017 1144   BILITOT 0.37 11/04/2016 1334         Lab Results  Component Value Date   WBC 7.5 04/08/2017   HGB 12.8 (L) 04/08/2017   HCT 38.2 (L) 04/08/2017   MCV 84.0 04/08/2017   PLT 283 04/08/2017       EOS                       0.9      04/08/2017         Lab Results  Component Value Date   TSH 0.47 03/18/2017     Lab Results  Component Value Date   PROBNP 164.0 (H) 04/08/2017       Lab Results  Component Value Date   ESRSEDRATE 23 (H) 04/08/2017   ESRSEDRATE 28 (H) 03/27/2012   ESRSEDRATE 9  04/24/2009       I personally reviewed images and agree with radiology impression as follows:   Chest CTa 04/12/17 1. No evidence of pulmonary emboli. 2. Radiation fibrosis changes in the right upper and lower lobes and in the right hilum. 3. New slightly enlarged aortopulmonary window and subcarinal lymph nodes. These could be reactive but I cannot exclude metastasis. 4. Two new nodules in the right lung, 1 in the right lower lobe and 1 along the superior aspect of the right major fissure. Given the patient's history, these will need to be followed to exclude metastatic disease.     Assessment:

## 2017-05-06 NOTE — Progress Notes (Signed)
PFT done today. 

## 2017-05-06 NOTE — Patient Instructions (Addendum)
Stop gabapentin to see if thinking improves and if not then see your neurologist Dr Jannifer Franklin  Keep your appt to see the ENT doctor about your sinuses and your hoarsness   Pulmonary follow up is as needed

## 2017-05-07 ENCOUNTER — Encounter: Payer: Self-pay | Admitting: Internal Medicine

## 2017-05-07 NOTE — Assessment & Plan Note (Addendum)
CTa 04/12/17  Neg PE / RT changes only  - Echo 09/05/39  Grade I diastolic dysfunction with nl LA   In the absence of cough on insp or desats I don't think the RT changes are the reason for his reported doe which is more variable (not necessarily proportionate to ex)  than one would see with RT fibrosis  and maybe related to RAF with   exertion but certainly not related to copd and very unlikely asthma so pulmonary f/u can be prn.

## 2017-05-07 NOTE — Assessment & Plan Note (Addendum)
Allergy profile 04/08/2017 >  Eos 0.8 /  IgE  21 RAST neg - Sinus CT 04/19/2017 >>> 1. Chronic left sphenoid sinusitis that developed between 2006 and 2013 brain MRIs.2. The other paranasal sinuses are clear Referred to ENT: shoemaker 04/22/17  "Limited Sinus CT scan from 04/19/17 showed isolated left sphenoid opacification. Given his history and findings I recommended treating for chronic sinusitis. Patient prescribed Omnicef 300 mg twice daily for 10 days, saline nasal spray and fluticasone nightly. Follow-up in 1 month for recheck with repeat CT scan of the sinuses. If he is unable to clear his obstruction and continues to have symptoms he may require endoscopic sinus surgery"   Upper airway cough syndrome (previously labeled PNDS),  is so named because it's frequently impossible to sort out how much is  CR/sinusitis with freq throat clearing (which can be related to primary GERD)   vs  causing  secondary (" extra esophageal")  GERD from wide swings in gastric pressure that occur with throat clearing, often  promoting self use of mint and menthol lozenges that reduce the lower esophageal sphincter tone and exacerbate the problem further in a cyclical fashion.   These are the same pts (now being labeled as having "irritable larynx syndrome" by some cough centers) who not infrequently have a history of having failed to tolerate ace inhibitors,  dry powder inhalers or biphosphonates or report having atypical/extraesophageal reflux symptoms that don't respond to standard doses of PPI  and are easily confused as having aecopd or asthma flares by even experienced allergists/ pulmonologists (myself included).  Pt's perspective was "everything was fine" and not clear he did anything he was asked to do as remembers nothing of the ov > reminded to return to [facility] for f/u.  I stopped his gabapentin today due to cognitive changes so may see this problem worsen and will need to make judgement about benefit vs side  effects if cough flares depending on cognitive changes also

## 2017-05-07 NOTE — Assessment & Plan Note (Signed)
Quit smoking 1978 Spirometry 08/26/2016  FEV1 2.38 (91%)  Ratio 73 with mild curvature   -  PFT's  05/06/2017  FEV1 2.21 (82 % ) ratio 79  p 9 % improvement from saba p nothing prior to study with DLCO  42 % corrects to 69  % for alv volume   - 05/06/2017  Walked RA x 3 laps @ 185 ft each slow pace stopped due to end of study and then started coughing   He actually does not meed criteria for copd but could still have an asthmatic component but absence of noct symptoms and constant throat clearing during the day are much more suggestive of uacs and most inhalers are likely to aggravate this problem so were not suggested.  I had an extended discussion with the patient and then again on return to the room with daugher on speaker phone  reviewing all relevant studies completed to date and  lasting 25 minutes of a 40  minute  Final summary office  visit addressing  non-specific but potentially very serious refractory respiratory symptoms of uncertain and potentially multiple  etiologies.   Each maintenance medication was reviewed in detail including most importantly the difference between maintenance and prns and under what circumstances the prns are to be triggered using an action plan format that is not reflected in the computer generated alphabetically organized AVS.    Please see AVS for specific instructions unique to this office visit that I personally wrote and verbalized to the the pt in detail and then reviewed with pt  by my nurse highlighting any changes in therapy/plan of care  recommended at today's visit.

## 2017-05-09 ENCOUNTER — Other Ambulatory Visit: Payer: Medicare Other

## 2017-05-09 ENCOUNTER — Ambulatory Visit (HOSPITAL_COMMUNITY): Payer: Medicare Other

## 2017-05-09 ENCOUNTER — Telehealth: Payer: Self-pay | Admitting: Cardiovascular Disease

## 2017-05-09 ENCOUNTER — Telehealth: Payer: Self-pay

## 2017-05-09 ENCOUNTER — Other Ambulatory Visit: Payer: Medicare Other | Admitting: *Deleted

## 2017-05-09 DIAGNOSIS — D649 Anemia, unspecified: Secondary | ICD-10-CM | POA: Diagnosis not present

## 2017-05-09 NOTE — Telephone Encounter (Signed)
That's fine an appropriate

## 2017-05-09 NOTE — Telephone Encounter (Signed)
Patient was not seen by [facility], was a missed visit.  States that it was rescheduled for next day, but MD discontinued [place] nursing.

## 2017-05-09 NOTE — Telephone Encounter (Signed)
New message     Patients [facility] - [name] wants to switch patient from Dr Johnsie Cancel to Dr Lovena Le , since patient is having problem with AFIB

## 2017-05-10 LAB — CBC
Hematocrit: 32.4 % — ABNORMAL LOW (ref 37.5–51.0)
Hemoglobin: 11.1 g/dL — ABNORMAL LOW (ref 13.0–17.7)
MCH: 30.1 pg (ref 26.6–33.0)
MCHC: 34.3 g/dL (ref 31.5–35.7)
MCV: 88 fL (ref 79–97)
Platelets: 331 x10E3/uL (ref 150–379)
RBC: 3.69 x10E6/uL — ABNORMAL LOW (ref 4.14–5.80)
RDW: 14.2 % (ref 12.3–15.4)
WBC: 5.1 x10E3/uL (ref 3.4–10.8)

## 2017-05-11 NOTE — Progress Notes (Signed)
Pt has been made aware of his lab results and to continue on the Eilquis.  Pt verbalized understanding.  jw 05/11/17

## 2017-05-12 ENCOUNTER — Telehealth: Payer: Self-pay | Admitting: Oncology

## 2017-05-12 ENCOUNTER — Inpatient Hospital Stay: Payer: Medicare Other

## 2017-05-12 ENCOUNTER — Inpatient Hospital Stay: Payer: Medicare Other | Attending: Internal Medicine | Admitting: Oncology

## 2017-05-12 ENCOUNTER — Encounter: Payer: Self-pay | Admitting: Oncology

## 2017-05-12 VITALS — BP 105/67 | HR 61 | Temp 97.8°F | Resp 20 | Ht 70.0 in | Wt 152.5 lb

## 2017-05-12 DIAGNOSIS — Z902 Acquired absence of lung [part of]: Secondary | ICD-10-CM

## 2017-05-12 DIAGNOSIS — C342 Malignant neoplasm of middle lobe, bronchus or lung: Secondary | ICD-10-CM | POA: Insufficient documentation

## 2017-05-12 DIAGNOSIS — C3491 Malignant neoplasm of unspecified part of right bronchus or lung: Secondary | ICD-10-CM

## 2017-05-12 LAB — CBC WITH DIFFERENTIAL/PLATELET
BASOS PCT: 1 %
Basophils Absolute: 0 10*3/uL (ref 0.0–0.1)
EOS ABS: 0.1 10*3/uL (ref 0.0–0.5)
Eosinophils Relative: 1 %
HCT: 38 % — ABNORMAL LOW (ref 38.4–49.9)
HEMOGLOBIN: 12.8 g/dL — AB (ref 13.0–17.1)
Lymphocytes Relative: 13 %
Lymphs Abs: 0.8 10*3/uL — ABNORMAL LOW (ref 0.9–3.3)
MCH: 28.7 pg (ref 27.2–33.4)
MCHC: 33.6 g/dL (ref 32.0–36.0)
MCV: 85.5 fL (ref 79.3–98.0)
MONOS PCT: 8 %
Monocytes Absolute: 0.5 10*3/uL (ref 0.1–0.9)
NEUTROS PCT: 77 %
Neutro Abs: 4.6 10*3/uL (ref 1.5–6.5)
PLATELETS: 326 10*3/uL (ref 140–400)
RBC: 4.45 MIL/uL (ref 4.20–5.82)
RDW: 14.7 % — ABNORMAL HIGH (ref 11.0–14.6)
WBC: 5.9 10*3/uL (ref 4.0–10.3)

## 2017-05-12 LAB — COMPREHENSIVE METABOLIC PANEL
ALK PHOS: 108 U/L (ref 40–150)
ALT: 7 U/L (ref 0–55)
AST: 12 U/L (ref 5–34)
Albumin: 3.1 g/dL — ABNORMAL LOW (ref 3.5–5.0)
Anion gap: 8 (ref 3–11)
BUN: 20 mg/dL (ref 7–26)
CALCIUM: 9.5 mg/dL (ref 8.4–10.4)
CO2: 26 mmol/L (ref 22–29)
CREATININE: 1.12 mg/dL (ref 0.70–1.30)
Chloride: 107 mmol/L (ref 98–109)
GFR, EST NON AFRICAN AMERICAN: 58 mL/min — AB (ref >60)
Glucose, Bld: 137 mg/dL (ref 70–140)
Potassium: 4.3 mmol/L (ref 3.5–5.1)
Sodium: 141 mmol/L (ref 136–145)
Total Bilirubin: 0.4 mg/dL (ref 0.2–1.2)
Total Protein: 7.2 g/dL (ref 6.4–8.3)

## 2017-05-12 NOTE — Telephone Encounter (Signed)
Scheduled appt per 3/14 los - Gave patient AVS and calender per los. [facility] to contact pt with ct scan .

## 2017-05-12 NOTE — Assessment & Plan Note (Addendum)
This is a very pleasant 84 year old white male recently diagnosed with a stage IA (T1c, N0, M0) non-small cell lung cancer, adenocarcinoma status post right middle lobectomy with lymph node dissection with very close vascular resection margin diagnosed in July 2018.  The patient completed radiation to the right lung in November 2018. He had a recent restaging CT scan and is here to discuss the results.  The patient was seen with Dr. Julien Nordmann.  CT scan results were discussed with the patient and his daughter.  The CT scan shows slightly enlarged aortopulmonary window and subcarinal lymph nodes.  There is also a new small nodule on the right major fissure and another small nodule in the right lower lobe.  Discussed with the patient and his daughter that these could represent inflammation versus recurrence of his cancer.  Recommend that he have a repeat CT scan of the chest in approximately 3 months.  If these are still present or are enlarging, then we will consider further workup with a PET scan and biopsy.  The patient is in agreement to this plan.  He will follow-up in 3 months to discuss his CT scan results.  He was advised to call immediately if he has any concerning symptoms in the interval. The patient voices understanding of current disease status and treatment options and is in agreement with the current care plan. All questions were answered. The patient knows to call the clinic with any problems, questions or concerns. We can certainly see the patient much sooner if necessary.

## 2017-05-12 NOTE — Progress Notes (Signed)
[facility] OFFICE PROGRESS NOTE  Biagio Borg, MD [place] [id number]  DIAGNOSIS: stage IA (T1c, N0, M0) non-small cell lung cancer, adenocarcinoma status post right middle lobectomy with lymph node dissection with very close vascular resection margin diagnosed in July 2018.  PRIOR THERAPY:  1) status post right middle lobectomy with lymph node dissection with very close vascular resection margin in July 2018. 2) radiation to the right lung 11/18/2016 through 12/30/2016.  CURRENT THERAPY: Observation  INTERVAL HISTORY: Raymond Logan 84 y.o. male returns for routine follow-up visit accompanied by his daughter.  The patient is having worsening of his dyspnea on exertion.  He has been seen by pulmonology and cardiology.  His shortness of breath is thought to be related to his atrial fibrillation and he has follow-up again with cardiology later this month.  He is currently on Eliquis.  The patient's daughter reports that her father is having more memory issues.  She is trying to help get him to his appointments.  The patient denies fevers and chills.  Denies chest pain, shortness of breath at rest, hemoptysis.  He does have a nonproductive cough.  Denies nausea, vomiting, constipation, diarrhea.  The patient had a recent CT angiogram of the chest and is here to discuss the results.  MEDICAL HISTORY: Past Medical History:  Diagnosis Date  . ALLERGIC RHINITIS 01/23/2007  . Arthritis    hands  . BENIGN PROSTATIC HYPERTROPHY 09/10/2006  . Chronic headaches   . Complication of anesthesia    difficulty voiding- if cath needed needs to placed with need to be done with scope  . COPD, MILD 04/03/2010   patient denies on preop visit of 08/02/2014   . Difficulty in urination   . FATIGUE 01/08/2008  . GERD 04/15/2010  . H. pylori infection    hx of   . Headache(784.0) 09/10/2006  . HOARSENESS 04/15/2010  . HYPERLIPIDEMIA 09/10/2006  . INSOMNIA-SLEEP DISORDER-UNSPEC  04/24/2009  . PEPTIC ULCER DISEASE 01/23/2007  . Pneumonia    1953    ALLERGIES:  is allergic to alfuzosin; aricept [donepezil hcl]; and penicillins.  MEDICATIONS:  Current Outpatient Medications  Medication Sig Dispense Refill  . apixaban (ELIQUIS) 5 MG TABS tablet Take 1 tablet (5 mg total) by mouth 2 (two) times daily. 180 tablet 3  . calcium carbonate (TUMS - DOSED IN MG ELEMENTAL CALCIUM) 500 MG chewable tablet Chew 1 tablet by mouth 3 (three) times daily as needed (for acid reflux.).    [name] dextromethorphan (DELSYM) 30 MG/5ML liquid Take 5 mLs (30 mg total) by mouth 2 (two) times daily. 89 mL 5  . finasteride (PROSCAR) 5 MG tablet Take 5 mg by mouth at bedtime.     . silodosin (RAPAFLO) 8 MG CAPS capsule Take 8 mg by mouth at bedtime.     [name] zolpidem (AMBIEN CR) 12.5 MG CR tablet Take 1 tablet (12.5 mg total) by mouth at bedtime as needed for sleep. 90 tablet 1  . traMADol (ULTRAM) 50 MG tablet Take 50 mg by mouth every 6 (six) hours as needed for moderate pain.     No current facility-administered medications for this visit.     SURGICAL HISTORY:  Past Surgical History:  Procedure Laterality Date  . CATARACT EXTRACTION Left   . COLONOSCOPY    . CYSTOSCOPY N/A 10/07/2016   Procedure: CYSTOSCOPY;  Surgeon: Festus Aloe, MD;  Location: [facility];  Service: Urology;  Laterality: N/A;  . HEMORRHOID  BANDING    . INSERTION OF SUPRAPUBIC CATHETER N/A 10/07/2016   Procedure: FOLEY  CATHETER PLACEMENT;  Surgeon: Festus Aloe, MD;  Location: [facility];  Service: Urology;  Laterality: N/A;  . KNEE ARTHROSCOPY Left    torn meniscus  . LUMBAR LAMINECTOMY/DECOMPRESSION MICRODISCECTOMY Right 08/07/2014   Procedure: complete DECOMPRESSION LUMBAR LAMINECTOMY/MICRODISCECTOMY OF L4-L5 for spinal stenosis, DECOMPRESSION OF L3-L4;  Surgeon: Latanya Maudlin, MD;  Location: [facility];  Service: Orthopedics;  Laterality: Right;  . ROTATOR CUFF REPAIR Right   . SHOULDER ARTHROSCOPY     x3, L & R  . VIDEO  ASSISTED THORACOSCOPY (VATS)/ LOBECTOMY Right 10/07/2016   Procedure: VIDEO ASSISTED THORACOSCOPY (VATS)/RIGHT MIDDLE LOBECTOMY;  Surgeon: [name], MD;  Location: [facility];  Service: Thoracic;  Laterality: Right;  [name] VIDEO BRONCHOSCOPY WITH ENDOBRONCHIAL NAVIGATION N/A 09/23/2016   Procedure: VIDEO BRONCHOSCOPY WITH ENDOBRONCHIAL NAVIGATION;  Surgeon: [name], MD;  Location: [facility];  Service: Thoracic;  Laterality: N/A;  . VIDEO BRONCHOSCOPY WITH ENDOBRONCHIAL ULTRASOUND N/A 09/23/2016   Procedure: VIDEO BRONCHOSCOPY WITH ENDOBRONCHIAL ULTRASOUND;  Surgeon: [name], MD;  Location: [facility];  Service: Thoracic;  Laterality: N/A;    REVIEW OF SYSTEMS:   Review of Systems  Constitutional: Negative for appetite change, chills, fatigue, fever and unexpected weight change.  HENT:   Negative for mouth sores, nosebleeds, sore throat and trouble swallowing.   Eyes: Negative for eye problems and icterus.  Respiratory: Negative for hemoptysis, shortness of breath at rest and wheezing.  Positive for nonproductive cough and dyspnea on exertion.   Cardiovascular: Negative for chest pain and leg swelling.  Gastrointestinal: Negative for abdominal pain, constipation, diarrhea, nausea and vomiting.  Genitourinary: Negative for bladder incontinence, difficulty urinating, dysuria, frequency and hematuria.   Musculoskeletal: Negative for back pain, neck pain and neck stiffness.  Skin: Negative for itching and rash.  Neurological: Negative for dizziness, extremity weakness, headaches, light-headedness and seizures.  Hematological: Negative for adenopathy. Does not bruise/bleed easily.  Psychiatric/Behavioral: Negative for depression. The patient is not nervous/anxious.  Positive for memory impairment and insomnia.    PHYSICAL EXAMINATION:  Blood pressure 105/67, pulse 61, temperature 97.8 F (36.6 C), temperature source Oral, resp. rate 20, height 5\' 10"  (1.778 m), weight 152 lb 8  oz (69.2 kg), SpO2 100 %.  ECOG PERFORMANCE STATUS: 1 - Symptomatic but completely ambulatory  Physical Exam  Constitutional: Oriented to person, place, and time. No distress.  HENT:  Head: Normocephalic and atraumatic.  Mouth/Throat: Oropharynx is clear and moist. No oropharyngeal exudate.  Eyes: Conjunctivae are normal. Right eye exhibits no discharge. Left eye exhibits no discharge. No scleral icterus.  Neck: Normal range of motion. Neck supple.  Cardiovascular: Normal rate, regular rhythm, normal heart sounds and intact distal pulses.   Pulmonary/Chest: Effort normal and breath sounds normal. No respiratory distress. No wheezes. No rales.  Abdominal: Soft. Bowel sounds are normal. Exhibits no distension and no mass. There is no tenderness.  Musculoskeletal: Normal range of motion. Exhibits no edema.  Lymphadenopathy:    No cervical adenopathy.  Neurological: Alert and oriented to person, place, and time. Exhibits normal muscle tone. Coordination normal.  Skin: Skin is warm and dry. No rash noted. Not diaphoretic. No erythema. No pallor.  Psychiatric: Mood and judgment normal.  Vitals reviewed.  LABORATORY DATA: Lab Results  Component Value Date   WBC 5.9 05/12/2017   HGB 12.8 (L) 05/12/2017   HCT 38.0 (L) 05/12/2017   MCV 85.5 05/12/2017   PLT 326  05/12/2017      Chemistry      Component Value Date/Time   NA 141 05/12/2017 0923   NA 142 11/04/2016 1334   K 4.3 05/12/2017 0923   K 4.0 11/04/2016 1334   CL 107 05/12/2017 0923   CO2 26 05/12/2017 0923   CO2 26 11/04/2016 1334   BUN 20 05/12/2017 0923   BUN 15.4 11/04/2016 1334   CREATININE 1.12 05/12/2017 0923   CREATININE 1.2 11/04/2016 1334      Component Value Date/Time   CALCIUM 9.5 05/12/2017 0923   CALCIUM 9.3 11/04/2016 1334   ALKPHOS 108 05/12/2017 0923   ALKPHOS 91 11/04/2016 1334   AST 12 05/12/2017 0923   AST 14 11/04/2016 1334   ALT 7 05/12/2017 0923   ALT 10 11/04/2016 1334   BILITOT 0.4  05/12/2017 0923   BILITOT 0.37 11/04/2016 1334       RADIOGRAPHIC STUDIES:  Blanca Wo Cm  Result Date: 04/19/2017 CLINICAL DATA:  Persistent cough for months EXAM: CT PARANASAL SINUS LIMITED WITHOUT CONTRAST TECHNIQUE: Non-contiguous multidetector CT images of the paranasal sinuses were obtained in a single plane without contrast. COMPARISON:  Brain MRI 12/22/2016 and earlier. FINDINGS: The majority of the covered left sphenoid sinus is opacified by material that has a rounded shape. This correlates with T2 hypointense and T1 hyperintense material on 2018 brain MRI, likely inspissated secretions/debris. The other paranasal sinuses are clear. The nasal septum is essentially midline. No significant intracranial, orbital, or soft tissue finding. IMPRESSION: 1. Chronic left sphenoid sinusitis that developed between 2006 and 2013 brain MRIs. 2. The other paranasal sinuses are clear. Electronically Signed   By: Monte Fantasia M.D.   On: 04/19/2017 12:58   EXAM: CT ANGIOGRAPHY CHEST WITH CONTRAST  TECHNIQUE: Multidetector CT imaging of the chest was performed using the standard protocol during bolus administration of intravenous contrast. Multiplanar CT image reconstructions and MIPs were obtained to evaluate the vascular anatomy.  CONTRAST:  17mL ISOVUE-370 IOPAMIDOL (ISOVUE-370) INJECTION 76%  COMPARISON:  Chest x-ray dated 03/08/2017 and CT scan dated 08/17/2016  FINDINGS: Cardiovascular: Satisfactory opacification of the pulmonary arteries to the segmental level. No evidence of pulmonary embolism. Normal heart size. No pericardial effusion.  Mediastinum/Nodes: There is an enlarged aortopulmonary window best seen on image 33 of series 4 measuring 11 mm, increased since the prior CT scan. There is also a 15 mm subcarinal lymph node seen on image 47 of series 4 which is new since the prior study. There is increased soft tissue density around the right hilum extending  into the right upper and lower lobes consistent with radiation fibrosis. Some of this fibrosis narrows branches of the right pulmonary artery.  Lungs/Pleura: There is linear radiation fibrosis in the superior aspect of the right lower lobe with thickening of the major fissure with some peribronchial thickening in the upper and lower lobes on the right. Right middle lobe has been resected.  There is a new 5 x 7 mm nodule on the posterosuperior aspect of the right major fissure best seen on image 62 of series 10 and image 29 of series 6. There is a new 6 mm nodule in the right lower lobe best seen on image 61 of series 6 and image 70 of series 9 and image 41 of series 10.  There is a small right pleural effusion.  The left lung is clear except for small nodule along the left major fissure on image 40 of series 6 and image  134 of series 8, unchanged since the prior study.  Upper Abdomen: No acute abnormality.  Musculoskeletal: No chest wall abnormality. No acute or significant osseous findings.  Review of the MIP images confirms the above findings.  IMPRESSION: 1. No evidence of pulmonary emboli. 2. Radiation fibrosis changes in the right upper and lower lobes and in the right hilum. 3. New slightly enlarged aortopulmonary window and subcarinal lymph nodes. These could be reactive but I cannot exclude metastasis. 4. Two new nodules in the right lung, 1 in the right lower lobe and 1 along the superior aspect of the right major fissure. Given the patient's history, these will need to be followed to exclude metastatic disease.   Electronically Signed   By: Lorriane Shire M.D.   On: 04/12/2017 09:30  ASSESSMENT/PLAN:  Malignant neoplasm of middle lobe of right lung [facility]) This is a very pleasant 84 year old white male recently diagnosed with a stage IA (T1c, N0, M0) non-small cell lung cancer, adenocarcinoma status post right middle lobectomy with lymph node  dissection with very close vascular resection margin diagnosed in July 2018.  The patient completed radiation to the right lung in November 2018. He had a recent restaging CT scan and is here to discuss the results.  The patient was seen with Dr. Julien Nordmann.  CT scan results were discussed with the patient and his daughter.  The CT scan shows slightly enlarged aortopulmonary window and subcarinal lymph nodes.  There is also a new small nodule on the right major fissure and another small nodule in the right lower lobe.  Discussed with the patient and his daughter that these could represent inflammation versus recurrence of his cancer.  Recommend that he have a repeat CT scan of the chest in approximately 3 months.  If these are still present or are enlarging, then we will consider further workup with a PET scan and biopsy.  The patient is in agreement to this plan.  He will follow-up in 3 months to discuss his CT scan results.  He was advised to call immediately if he has any concerning symptoms in the interval. The patient voices understanding of current disease status and treatment options and is in agreement with the current care plan. All questions were answered. The patient knows to call the clinic with any problems, questions or concerns. We can certainly see the patient much sooner if necessary.  Orders Placed This Encounter  Procedures  . CT CHEST W CONTRAST    Standing Status:   Future    Standing Expiration Date:   05/13/2018    Order Specific Question:   If indicated for the ordered procedure, I authorize the administration of contrast media per Radiology protocol    Answer:   Yes    Order Specific Question:   Preferred imaging location?    Answer:   [facility]    Order Specific Question:   Radiology Contrast Protocol - do NOT remove file path    Answer:   [email or web address]    Order Specific Question:   Reason for Exam additional comments    Answer:    Lung cancer. Restaging.  [name] CBC with Differential (Cancer Center Only)    Standing Status:   Future    Standing Expiration Date:   05/13/2018  . CMP ([facility] only)    Standing Status:   Future    Standing Expiration Date:   05/13/2018   Mikey Bussing, DNP, AGPCNP-BC, AOCNP 05/12/17   ADDENDUM: Hematology/Oncology  Attending: I had a face-to-face encounter with the patient.  I recommended his care plan.  This is a very pleasant 84 years old white male with a stage Ia non-small cell lung cancer, adenocarcinoma status post right middle lobectomy with lymph node dissection with close vascular margin status post palliative radiotherapy completed in November 2018. The patient has been on observation with no significant complaints. Recent CT scan of the chest showed needle and slightly enlarged with AP window and subcarinal lymph nodes that could be reactive in nature but there is also 2 new nodules in the right lung one in the right lower lobe. I discussed the scan results with the patient and his daughter.  I recommended for him to have repeat CT scan of the chest in 3 months for reevaluation of his condition and to rule out any disease recurrence. He was advised to call immediately if he has any concerning symptoms in the interval. Disclaimer: This note was dictated with voice recognition software. Similar sounding words can inadvertently be transcribed and may be missed upon review. Eilleen Kempf, MD 05/13/17

## 2017-05-17 NOTE — Telephone Encounter (Signed)
Ok with me. GT 

## 2017-05-18 DIAGNOSIS — R05 Cough: Secondary | ICD-10-CM | POA: Diagnosis not present

## 2017-05-18 DIAGNOSIS — J3489 Other specified disorders of nose and nasal sinuses: Secondary | ICD-10-CM | POA: Diagnosis not present

## 2017-05-18 DIAGNOSIS — J323 Chronic sphenoidal sinusitis: Secondary | ICD-10-CM | POA: Diagnosis not present

## 2017-05-27 ENCOUNTER — Encounter: Payer: Self-pay | Admitting: Internal Medicine

## 2017-05-27 ENCOUNTER — Ambulatory Visit (INDEPENDENT_AMBULATORY_CARE_PROVIDER_SITE_OTHER): Payer: Medicare Other | Admitting: Internal Medicine

## 2017-05-27 VITALS — BP 118/64 | HR 89 | Ht 68.0 in | Wt 155.0 lb

## 2017-05-27 DIAGNOSIS — R0602 Shortness of breath: Secondary | ICD-10-CM

## 2017-05-27 DIAGNOSIS — I4891 Unspecified atrial fibrillation: Secondary | ICD-10-CM | POA: Diagnosis not present

## 2017-05-27 DIAGNOSIS — R0609 Other forms of dyspnea: Secondary | ICD-10-CM | POA: Diagnosis not present

## 2017-05-27 NOTE — Patient Instructions (Addendum)
Medication Instructions:  Your physician recommends that you continue on your current medications as directed. Please refer to the Current Medication list given to you today.  Labwork: None ordered.  Testing/Procedures: Your physician has requested that you have a lexiscan myoview. For further information please visit [email or web address]. Please follow instruction sheet, as given.  Please schedule lexiscan myoview.  Your physician has recommended that you wear an event monitor. Event monitors are medical devices that record the heart's electrical activity. Doctors most often [place] these monitors to diagnose arrhythmias. Arrhythmias are problems with the speed or rhythm of the heartbeat. The monitor is a small, portable device. You can wear one while you do your normal daily activities. This is usually used to diagnose what is causing palpitations/syncope (passing out).  Please schedule event monitor AFTER stress test.  Follow-Up: Your physician wants you to follow-up in: 3 months with Dr. Lovena Le.    Any Other Special Instructions Will Be Listed Below (If Applicable).  If you need a refill on your cardiac medications before your next appointment, please call your pharmacy.

## 2017-05-27 NOTE — Progress Notes (Signed)
HPI [name] is referred today for evaluation of sob and atrial fib. He is a pleasant 84 yo man with a h/o tobacco abuse, lung CA, s/p lobectomy for adenocarcinoma who has developed worsening sob, fatigue and weakness. He has had a marked reduction in his energy level. A CT scan from February showed signs of radiation fibrosis as well as new pulmonary nodules. He has had periods of atrial fibrillation with a RVR but he does not have palpitations. Review of his ECGs demonstrated atrial fib with a CVR as well as sinus tachycardia. He has been treated with systemic anti-coagulation. He is not on AA drug therapy.   Allergies  Allergen Reactions  . Alfuzosin Other (See Comments)    BP went crazy, dizzy, passing out   . Aricept [Donepezil Hcl] Other (See Comments)    Insomnia, headache  . Penicillins Hives    PATIENT HAS HAD A PCN REACTION WITH IMMEDIATE RASH, FACIAL/TONGUE/THROAT SWELLING, SOB, OR LIGHTHEADEDNESS WITH HYPOTENSION:  #  #  #  YES  #  #  #   Has patient had a PCN reaction causing severe rash involving mucus membranes or skin necrosis:No Has patient had a PCN reaction that required hospitalization:No Has patient had a PCN reaction occurring within the last 10 years:No If all of the above answers are "NO", then may proceed with Cephalosporin use.      Current Outpatient Medications  Medication Sig Dispense Refill  . apixaban (ELIQUIS) 5 MG TABS tablet Take 1 tablet (5 mg total) by mouth 2 (two) times daily. 180 tablet 3  . calcium carbonate (TUMS - DOSED IN MG ELEMENTAL CALCIUM) 500 MG chewable tablet Chew 1 tablet by mouth 3 (three) times daily as needed (for acid reflux.).    [name] dextromethorphan (DELSYM) 30 MG/5ML liquid Take 5 mLs (30 mg total) by mouth 2 (two) times daily. 89 mL 5  . finasteride (PROSCAR) 5 MG tablet Take 5 mg by mouth at bedtime.     . silodosin (RAPAFLO) 8 MG CAPS capsule Take 8 mg by mouth at bedtime.     . traMADol (ULTRAM) 50 MG tablet Take 50 mg  by mouth every 6 (six) hours as needed for moderate pain.     No current facility-administered medications for this visit.      Past Medical History:  Diagnosis Date  . ALLERGIC RHINITIS 01/23/2007  . Arthritis    hands  . BENIGN PROSTATIC HYPERTROPHY 09/10/2006  . Chronic headaches   . Complication of anesthesia    difficulty voiding- if cath needed needs to placed with need to be done with scope  . COPD, MILD 04/03/2010   patient denies on preop visit of 08/02/2014   . Difficulty in urination   . FATIGUE 01/08/2008  . GERD 04/15/2010  . H. pylori infection    hx of   . Headache(784.0) 09/10/2006  . HOARSENESS 04/15/2010  . HYPERLIPIDEMIA 09/10/2006  . INSOMNIA-SLEEP DISORDER-UNSPEC 04/24/2009  . PEPTIC ULCER DISEASE 01/23/2007  . Pneumonia    1955    ROS:   All systems reviewed and negative except as noted in the HPI.   Past Surgical History:  Procedure Laterality Date  . CATARACT EXTRACTION Left   . COLONOSCOPY    . CYSTOSCOPY N/A 10/07/2016   Procedure: CYSTOSCOPY;  Surgeon: Festus Aloe, MD;  Location: [facility];  Service: Urology;  Laterality: N/A;  . HEMORRHOID BANDING    . INSERTION OF SUPRAPUBIC CATHETER N/A 10/07/2016   Procedure:  FOLEY  CATHETER PLACEMENT;  Surgeon: Festus Aloe, MD;  Location: [place];  Service: Urology;  Laterality: N/A;  . KNEE ARTHROSCOPY Left    torn meniscus  . LUMBAR LAMINECTOMY/DECOMPRESSION MICRODISCECTOMY Right 08/07/2014   Procedure: complete DECOMPRESSION LUMBAR LAMINECTOMY/MICRODISCECTOMY OF L4-L5 for spinal stenosis, DECOMPRESSION OF L3-L4;  Surgeon: Latanya Maudlin, MD;  Location: [facility];  Service: Orthopedics;  Laterality: Right;  . ROTATOR CUFF REPAIR Right   . SHOULDER ARTHROSCOPY     x3, L & R  . VIDEO ASSISTED THORACOSCOPY (VATS)/ LOBECTOMY Right 10/07/2016   Procedure: VIDEO ASSISTED THORACOSCOPY (VATS)/RIGHT MIDDLE LOBECTOMY;  Surgeon: [name], MD;  Location: [facility];  Service: Thoracic;  Laterality: Right;  [name] VIDEO  BRONCHOSCOPY WITH ENDOBRONCHIAL NAVIGATION N/A 09/23/2016   Procedure: VIDEO BRONCHOSCOPY WITH ENDOBRONCHIAL NAVIGATION;  Surgeon: [name], MD;  Location: [facility] [place];  Service: Thoracic;  Laterality: N/A;  . VIDEO BRONCHOSCOPY WITH ENDOBRONCHIAL ULTRASOUND N/A 09/23/2016   Procedure: VIDEO BRONCHOSCOPY WITH ENDOBRONCHIAL ULTRASOUND;  Surgeon: [name], MD;  Location: [facility];  Service: Thoracic;  Laterality: N/A;     Family History  Problem Relation Age of Onset  . Prostate cancer Brother   . Alzheimer's disease Mother   . Alzheimer's disease Sister      Social History   Socioeconomic History  . Marital status: Married    Spouse name: Not on file  . Number of children: 3  . Years of education: Not on file  . Highest education level: Not on file  Occupational History  . Occupation: Retired    Fish farm manager: RETIRED  Social Needs  . Financial resource strain: Not on file  . Food insecurity:    Worry: Not on file    Inability: Not on file  . Transportation needs:    Medical: Not on file    Non-medical: Not on file  Tobacco Use  . Smoking status: Former Smoker    Packs/day: 1.00    Years: 23.00    Pack years: 23.00    Last attempt to quit: 1978    Years since quitting: 41.2  . Smokeless tobacco: Never Used  Substance and Sexual Activity  . Alcohol use: No    Comment: rare  . Drug use: No  . Sexual activity: Not on file  Lifestyle  . Physical activity:    Days per week: Not on file    Minutes per session: Not on file  . Stress: Not on file  Relationships  . Social connections:    Talks on phone: Not on file    Gets together: Not on file    Attends religious service: Not on file    Active member of club or organization: Not on file    Attends meetings of clubs or organizations: Not on file    Relationship status: Not on file  . Intimate partner violence:    Fear of current or ex partner: Not on file    Emotionally abused: Not on file    Physically  abused: Not on file    Forced sexual activity: Not on file  Other Topics Concern  . Not on file  Social History Narrative  . Not on file     BP 118/64   Pulse 89   Ht 5\' 8"  (1.727 m)   Wt 155 lb (70.3 kg)   SpO2 96%   BMI 23.57 kg/m   Physical Exam:  Chronically ill appearing 84 yo man, NAD HEENT: Unremarkable Neck:  6cm JVD, no  thyromegally Lymphatics:  No adenopathy Back:  No CVA tenderness Lungs:  Clear with no wheezes but scattered rales HEART:  Regular rate rhythm, no murmurs, no rubs, no clicks Abd:  soft, positive bowel sounds, no organomegally, no rebound, no guarding Ext:  2 plus pulses, no edema, no cyanosis, no clubbing Skin:  No rashes no nodules Neuro:  CN II through XII intact, motor grossly intact  EKG - nsr with PAC's  Assess/Plan: 1. PAF - He is in NSR today. He still feels dyspneic. He does not have palpitations. I do suspect his atrial fib is playing a role in his symptoms but I doubt it is the underlying reason for his worsening symptoms as he is dyspneic today with exertion in NSR. He will continue anti-coagulation but no current indiction for AA drug therapy. 2. Dyspnea - I suspect that his symptoms are multifactorial. I am concerned that he could have occult ischemia. He does not have angina.  I have asked him to undergo a lexiscan myoview. If it is abnormal, then I would suggest left heart cath. 3. Lung CA - note recent CT scan and plans for a repeat. I reviewed Dr. Gustavus Bryant note. I have him walk in the office today and on room air he did not desaturate. His HR increased appropriately to about 105/min.  Mikle Bosworth.D.

## 2017-06-07 ENCOUNTER — Other Ambulatory Visit: Payer: Self-pay

## 2017-06-07 ENCOUNTER — Ambulatory Visit (INDEPENDENT_AMBULATORY_CARE_PROVIDER_SITE_OTHER): Payer: Medicare Other | Admitting: Thoracic Surgery (Cardiothoracic Vascular Surgery)

## 2017-06-07 ENCOUNTER — Telehealth (HOSPITAL_COMMUNITY): Payer: Self-pay | Admitting: *Deleted

## 2017-06-07 VITALS — BP 118/58 | HR 81 | Resp 16 | Ht 68.0 in | Wt 155.0 lb

## 2017-06-07 DIAGNOSIS — C342 Malignant neoplasm of middle lobe, bronchus or lung: Secondary | ICD-10-CM

## 2017-06-07 DIAGNOSIS — Z902 Acquired absence of lung [part of]: Secondary | ICD-10-CM

## 2017-06-07 DIAGNOSIS — C3491 Malignant neoplasm of unspecified part of right bronchus or lung: Secondary | ICD-10-CM | POA: Diagnosis not present

## 2017-06-07 NOTE — Telephone Encounter (Signed)
Patient given detailed instructions per Myocardial Perfusion Study Information Sheet for the test on 06/10/17 at 1130. Patient notified to arrive 15 minutes early and that it is imperative to arrive on time for appointment to keep from having the test rescheduled.  If you need to cancel or reschedule your appointment, please call the office within 24 hours of your appointment. . Patient verbalized understanding.Tamrah Victorino, Ranae Palms

## 2017-06-07 NOTE — Progress Notes (Signed)
[address]       [place] [address]             [phone]    HPI: Raymond Logan returns for a scheduled follow-up appointment  Raymond Logan is an 84 yo man who had a thoracoscopic right middle lobectomy in August 2018 for a T1c, N0, M0, stage IA3 adenocarcinoma.  He had postoperative adjuvant radiation for a close vascular margin.  During radiation he developed a pleural effusion.  I last saw him in January.  He was feeling much better at that time.  He was playing ping-pong on a regular basis although he was not back to his baseline physical status from prior to surgery.  He had noticed a cough.  He was starting to gain weight.  Since that visit he is started to feel more poorly again.  He is having shortness of breath with exertion.  He has a persistent cough.  He says he has been found to have a sinus problem that needs to be treated surgically, but that otorhinolaryngology feels that will help his cough.  His appetite is fair.  He has not gained any weight recently.  He saw Dr. Julien Nordmann recently.  A CT of the chest showed progression of radiation induced changes.  There was a small to moderate pleural effusion.  There was some borderline subcarinal and left paratracheal adenopathy and 2 small lung nodules.  He is scheduled to have another CT in June.  Past Medical History:  Diagnosis Date  . ALLERGIC RHINITIS 01/23/2007  . Arthritis    hands  . BENIGN PROSTATIC HYPERTROPHY 09/10/2006  . Chronic headaches   . Complication of anesthesia    difficulty voiding- if cath needed needs to placed with need to be done with scope  . COPD, MILD 04/03/2010   patient denies on preop visit of 08/02/2014   . Difficulty in urination   . FATIGUE 01/08/2008  . GERD 04/15/2010  . H. pylori infection    hx of   . Headache(784.0) 09/10/2006  . HOARSENESS 04/15/2010  . HYPERLIPIDEMIA 09/10/2006  . INSOMNIA-SLEEP DISORDER-UNSPEC 04/24/2009  . PEPTIC ULCER DISEASE 01/23/2007  .  Pneumonia    1955     Current Outpatient Medications  Medication Sig Dispense Refill  . apixaban (ELIQUIS) 5 MG TABS tablet Take 1 tablet (5 mg total) by mouth 2 (two) times daily. 180 tablet 3  . calcium carbonate (TUMS - DOSED IN MG ELEMENTAL CALCIUM) 500 MG chewable tablet Chew 1 tablet by mouth 3 (three) times daily as needed (for acid reflux.).    [name] dextromethorphan (DELSYM) 30 MG/5ML liquid Take 5 mLs (30 mg total) by mouth 2 (two) times daily. 89 mL 5  . finasteride (PROSCAR) 5 MG tablet Take 5 mg by mouth at bedtime.     . silodosin (RAPAFLO) 8 MG CAPS capsule Take 8 mg by mouth at bedtime.     . traMADol (ULTRAM) 50 MG tablet Take 50 mg by mouth every 6 (six) hours as needed for moderate pain.     No current facility-administered medications for this visit.     Physical Exam BP (!) 118/58 (BP Location: Right Arm, Patient Position: Sitting, Cuff Size: Large)   Pulse 81   Resp 16   Ht 5\' 8"  (1.727 m)   Wt 155 lb (70.3 kg)   SpO2 98% Comment: ON RA  BMI 23.57 kg/m  Frequent cough 84 year old man in no acute distress No cervical  or supra clavicular adenopathy Cardiac irregularly irregular Lungs with bronchial breath sounds at right base No cervical or supra clavicular adenopathy  Diagnostic Tests: I reviewed his CT from February findings as noted in HPI.  Impression: [name] is an 84 year old man who had a right middle lobectomy for a stage IAC adenocarcinoma in August.  He was treated with adjuvant radiation due to a close vascular margin.  All of his nodes were negative at the time of surgery.  He had a difficult time with radiation and developed a pleural effusion.  He developed a ex vacuo after thoracentesis.  That problem eventually resolved.  He now is having difficulty with shortness of breath with exertion.  He also has a frequent cough.  Has been told that his cough may be due to a sinus condition.  He is going to have surgery for that in the near  future.  He does have some significant post radiation changes in the right lung.  I think that may be playing a role as well.  He does have paroxysmal atrial fibrillation.  He is not on any antiarrhythmic medications.  He was in sinus rhythm when he saw Dr. Lovena Le recently, but is in rate controlled atrial fibrillation today.  He is on anticoagulation.  His recent CT of the chest showed some small lung nodules and possible adenopathy.  I am not terribly impressed with the adenopathy but obviously cannot rule out the possibility of a recurrence.  I agree with the plan repeat a CT in 3 months and then do a PET if there are suspicious findings.  Plan: I will plan to see him back in July after he has had his repeat CT and has seen Dr. Julien Nordmann.  [name], MD [facility] [phone]

## 2017-06-08 ENCOUNTER — Telehealth: Payer: Self-pay

## 2017-06-08 NOTE — Telephone Encounter (Signed)
   [facility] Pre-operative Risk Assessment    Request for surgical clearance:  1. What type of surgery is being performed? Sinus Surgery   2. When is this surgery scheduled? TBD   3. What type of clearance is required (medical clearance vs. Pharmacy clearance to hold med vs. Both)? Both  4. Are there any medications that need to be held prior to surgery and how long? Eliquis   5. Practice name and name of physician performing surgery? [facility]  6. What is your office phone number [phone]    7.   What is your office fax number [phone]  8.   Anesthesia type (None, local, MAC, general) ? None Specified    Mendel Ryder 06/08/2017, 1:07 PM  _________________________________________________________________   (provider comments below)

## 2017-06-09 NOTE — Telephone Encounter (Signed)
Patient with diagnosis of Afib on Eliquis for anticoagulation.    Procedure: sinus surgery Date of procedure: TBD  CHADS2-VASc score of  2 (CHF, HTN, AGE, DM2, stroke/tia x 2, CAD, AGE, male)  CrCl 55ml/min  Per office protocol, patient can hold Eliquis for 24 hours prior to procedure.

## 2017-06-09 NOTE — Telephone Encounter (Signed)
   Primary Cardiologist: Cristopher Peru, MD  Chart reviewed as part of pre-operative protocol coverage. Patient was contacted 06/09/2017 in reference to pre-operative risk assessment for pending surgery as outlined below.  Raymond Logan was last seen on 05/27/17 by Dr. Lovena Le.  At that visit, Dr. Lovena Le was concerned about symptoms suggestive of ongoing ischemia and has recommended that he undergo lexiscan myoview. This is scheduled for tomorrow.   Dr. Lovena Le, please send a message back to preop pool if this patient is cleared for surger following myoview.   I will also route this to pharmacy for guidance on anticoagulation.   Baker, PA 06/09/2017, 4:03 PM

## 2017-06-10 ENCOUNTER — Ambulatory Visit (INDEPENDENT_AMBULATORY_CARE_PROVIDER_SITE_OTHER): Payer: Medicare Other

## 2017-06-10 ENCOUNTER — Ambulatory Visit (HOSPITAL_COMMUNITY): Payer: Medicare Other | Attending: Cardiology

## 2017-06-10 DIAGNOSIS — I2510 Atherosclerotic heart disease of native coronary artery without angina pectoris: Secondary | ICD-10-CM | POA: Insufficient documentation

## 2017-06-10 DIAGNOSIS — I4891 Unspecified atrial fibrillation: Secondary | ICD-10-CM | POA: Diagnosis not present

## 2017-06-10 DIAGNOSIS — R9439 Abnormal result of other cardiovascular function study: Secondary | ICD-10-CM | POA: Insufficient documentation

## 2017-06-10 DIAGNOSIS — R5383 Other fatigue: Secondary | ICD-10-CM | POA: Diagnosis not present

## 2017-06-10 DIAGNOSIS — R0602 Shortness of breath: Secondary | ICD-10-CM | POA: Insufficient documentation

## 2017-06-10 DIAGNOSIS — R0609 Other forms of dyspnea: Secondary | ICD-10-CM

## 2017-06-10 LAB — MYOCARDIAL PERFUSION IMAGING
CHL CUP NUCLEAR SRS: 5
CHL CUP NUCLEAR SSS: 7
LHR: 0.36
LV sys vol: 42 mL
LVDIAVOL: 84 mL (ref 62–150)
NUC STRESS TID: 0.89
Peak HR: 133 {beats}/min
Rest HR: 88 {beats}/min
SDS: 2

## 2017-06-10 MED ORDER — REGADENOSON 0.4 MG/5ML IV SOLN
0.4000 mg | Freq: Once | INTRAVENOUS | Status: AC
  Administered 2017-06-10: 0.4 mg via INTRAVENOUS

## 2017-06-10 MED ORDER — TECHNETIUM TC 99M TETROFOSMIN IV KIT
10.4000 | PACK | Freq: Once | INTRAVENOUS | Status: AC | PRN
  Administered 2017-06-10: 10.4 via INTRAVENOUS
  Filled 2017-06-10: qty 11

## 2017-06-10 MED ORDER — TECHNETIUM TC 99M TETROFOSMIN IV KIT
32.4000 | PACK | Freq: Once | INTRAVENOUS | Status: AC | PRN
  Administered 2017-06-10: 32.4 via INTRAVENOUS
  Filled 2017-06-10: qty 33

## 2017-06-12 NOTE — Telephone Encounter (Signed)
He has a markedly abnormal stress test and will need left heart cath before undergoing non-emrgent surgery. GT

## 2017-06-13 ENCOUNTER — Telehealth: Payer: Self-pay | Admitting: *Deleted

## 2017-06-13 NOTE — Telephone Encounter (Signed)
Received [employer] monitor report for patient from Day 1 of wearing monitor.  06/10/17 at 3:28 pm.  Atrial fibrillation HR 100-110. Pt is on apixaban 5 mg two times daily.  Pt had abnormal stress test 06/10/17 also.  Will be scheduled for cardiac cath per Dr. Lovena Le.  This was reviewed by Dr. Caryl Comes and placed in Dr. Forde Dandy box for review.

## 2017-06-13 NOTE — Telephone Encounter (Signed)
I will route this to Willeen Cass, Dr. Tanna Furry nurse for arrangement of cardiac cath per Dr. Lovena Le (see note attached) and remove from the preop pool. I will also efax this chain to [facility].

## 2017-06-13 NOTE — Telephone Encounter (Signed)
Call placed to Pt.  Pt notified of results of stress test. Pt does not recall discussing heart cath with Dr. Lovena Le at last appt. Will schedule Pt to see Dr. Lovena Le next available to discuss and schedule heart cath.

## 2017-06-13 NOTE — Telephone Encounter (Signed)
-----   Message from Evans Lance, MD sent at 06/12/2017  9:18 PM EDT ----- His stress test is markedly abnormal. He needs a right and left heart cath.

## 2017-06-15 ENCOUNTER — Ambulatory Visit (INDEPENDENT_AMBULATORY_CARE_PROVIDER_SITE_OTHER): Payer: Medicare Other | Admitting: Internal Medicine

## 2017-06-15 ENCOUNTER — Encounter: Payer: Self-pay | Admitting: Internal Medicine

## 2017-06-15 VITALS — BP 88/52 | HR 88 | Ht 68.0 in | Wt 153.0 lb

## 2017-06-15 DIAGNOSIS — R9439 Abnormal result of other cardiovascular function study: Secondary | ICD-10-CM

## 2017-06-15 DIAGNOSIS — I4891 Unspecified atrial fibrillation: Secondary | ICD-10-CM | POA: Diagnosis not present

## 2017-06-15 DIAGNOSIS — R0609 Other forms of dyspnea: Secondary | ICD-10-CM

## 2017-06-15 LAB — CBC WITH DIFFERENTIAL/PLATELET
BASOS: 0 %
Basophils Absolute: 0 10*3/uL (ref 0.0–0.2)
EOS (ABSOLUTE): 0.1 10*3/uL (ref 0.0–0.4)
EOS: 1 %
HEMATOCRIT: 33.3 % — AB (ref 37.5–51.0)
Hemoglobin: 11.9 g/dL — ABNORMAL LOW (ref 13.0–17.7)
Immature Grans (Abs): 0 10*3/uL (ref 0.0–0.1)
Immature Granulocytes: 0 %
Lymphocytes Absolute: 0.9 10*3/uL (ref 0.7–3.1)
Lymphs: 14 %
MCH: 30.7 pg (ref 26.6–33.0)
MCHC: 35.7 g/dL (ref 31.5–35.7)
MCV: 86 fL (ref 79–97)
MONOS ABS: 0.8 10*3/uL (ref 0.1–0.9)
Monocytes: 12 %
Neutrophils Absolute: 4.4 10*3/uL (ref 1.4–7.0)
Neutrophils: 73 %
PLATELETS: 340 10*3/uL (ref 150–379)
RBC: 3.87 x10E6/uL — ABNORMAL LOW (ref 4.14–5.80)
RDW: 14.5 % (ref 12.3–15.4)
WBC: 6.2 10*3/uL (ref 3.4–10.8)

## 2017-06-15 LAB — BASIC METABOLIC PANEL
BUN / CREAT RATIO: 14 (ref 10–24)
BUN: 13 mg/dL (ref 8–27)
CO2: 26 mmol/L (ref 20–29)
CREATININE: 0.96 mg/dL (ref 0.76–1.27)
Calcium: 9.3 mg/dL (ref 8.6–10.2)
Chloride: 101 mmol/L (ref 96–106)
GFR calc Af Amer: 84 mL/min/{1.73_m2} (ref >59)
GFR, EST NON AFRICAN AMERICAN: 72 mL/min/{1.73_m2} (ref >59)
GLUCOSE: 85 mg/dL (ref 65–99)
POTASSIUM: 4.2 mmol/L (ref 3.5–5.2)
SODIUM: 140 mmol/L (ref 134–144)

## 2017-06-15 NOTE — Progress Notes (Addendum)
HPI Raymond Logan returns today for followup the results of his stress test in the setting of unexplained exertional dyspnea. His symptoms have not changed. He does not have angina although I suspect his sob is his anginal equivalent. He previously was quite healthy.  Allergies  Allergen Reactions  . Alfuzosin Other (See Comments)    BP went crazy, dizzy, passing out   . Aricept [Donepezil Hcl] Other (See Comments)    Insomnia, headache  . Penicillins Hives    PATIENT HAS HAD A PCN REACTION WITH IMMEDIATE RASH, FACIAL/TONGUE/THROAT SWELLING, SOB, OR LIGHTHEADEDNESS WITH HYPOTENSION:  #  #  #  YES  #  #  #   Has patient had a PCN reaction causing severe rash involving mucus membranes or skin necrosis:No Has patient had a PCN reaction that required hospitalization:No Has patient had a PCN reaction occurring within the last 10 years:No If all of the above answers are "NO", then may proceed with Cephalosporin use.      Current Outpatient Medications  Medication Sig Dispense Refill  . apixaban (ELIQUIS) 5 MG TABS tablet Take 1 tablet (5 mg total) by mouth 2 (two) times daily. 180 tablet 3  . calcium carbonate (TUMS - DOSED IN MG ELEMENTAL CALCIUM) 500 MG chewable tablet Chew 1 tablet by mouth 3 (three) times daily as needed (for acid reflux.).    [name] finasteride (PROSCAR) 5 MG tablet Take 5 mg by mouth at bedtime.     . fluticasone (FLONASE) 50 MCG/ACT nasal spray SHAKE LQ AND U 2 SPRAYS IEN D  11  . silodosin (RAPAFLO) 8 MG CAPS capsule Take 8 mg by mouth at bedtime.     . traMADol (ULTRAM) 50 MG tablet Take 50 mg by mouth every 6 (six) hours as needed for moderate pain.    [name] dextromethorphan (DELSYM) 30 MG/5ML liquid Take 5 mLs (30 mg total) by mouth 2 (two) times daily. (Patient not taking: Reported on 06/15/2017) 89 mL 5   No current facility-administered medications for this visit.      Past Medical History:  Diagnosis Date  . ALLERGIC RHINITIS 01/23/2007  . Arthritis    hands  . BENIGN PROSTATIC HYPERTROPHY 09/10/2006  . Chronic headaches   . Complication of anesthesia    difficulty voiding- if cath needed needs to placed with need to be done with scope  . COPD, MILD 04/03/2010   patient denies on preop visit of 08/02/2014   . Difficulty in urination   . FATIGUE 01/08/2008  . GERD 04/15/2010  . H. pylori infection    hx of   . Headache(784.0) 09/10/2006  . HOARSENESS 04/15/2010  . HYPERLIPIDEMIA 09/10/2006  . INSOMNIA-SLEEP DISORDER-UNSPEC 04/24/2009  . PEPTIC ULCER DISEASE 01/23/2007  . Pneumonia    1955    ROS:   All systems reviewed and negative except as noted in the HPI.   Past Surgical History:  Procedure Laterality Date  . CATARACT EXTRACTION Left   . COLONOSCOPY    . CYSTOSCOPY N/A 10/07/2016   Procedure: CYSTOSCOPY;  Surgeon: Festus Aloe, MD;  Location: [place] [facility];  Service: Urology;  Laterality: N/A;  . HEMORRHOID BANDING    . INSERTION OF SUPRAPUBIC CATHETER N/A 10/07/2016   Procedure: FOLEY  CATHETER PLACEMENT;  Surgeon: Festus Aloe, MD;  Location: [place];  Service: Urology;  Laterality: N/A;  . KNEE ARTHROSCOPY Left    torn meniscus  . LUMBAR LAMINECTOMY/DECOMPRESSION MICRODISCECTOMY Right 08/07/2014   Procedure: complete DECOMPRESSION LUMBAR LAMINECTOMY/MICRODISCECTOMY OF  L4-L5 for spinal stenosis, DECOMPRESSION OF L3-L4;  Surgeon: Latanya Maudlin, MD;  Location: [facility];  Service: Orthopedics;  Laterality: Right;  . ROTATOR CUFF REPAIR Right   . SHOULDER ARTHROSCOPY     x3, L & R  . VIDEO ASSISTED THORACOSCOPY (VATS)/ LOBECTOMY Right 10/07/2016   Procedure: VIDEO ASSISTED THORACOSCOPY (VATS)/RIGHT MIDDLE LOBECTOMY;  Surgeon: [name], MD;  Location: [place];  Service: Thoracic;  Laterality: Right;  [name] VIDEO BRONCHOSCOPY WITH ENDOBRONCHIAL NAVIGATION N/A 09/23/2016   Procedure: VIDEO BRONCHOSCOPY WITH ENDOBRONCHIAL NAVIGATION;  Surgeon: [name], MD;  Location: [place];  Service: Thoracic;  Laterality: N/A;  . VIDEO  BRONCHOSCOPY WITH ENDOBRONCHIAL ULTRASOUND N/A 09/23/2016   Procedure: VIDEO BRONCHOSCOPY WITH ENDOBRONCHIAL ULTRASOUND;  Surgeon: [name], MD;  Location: [facility];  Service: Thoracic;  Laterality: N/A;     Family History  Problem Relation Age of Onset  . Prostate cancer Brother   . Alzheimer's disease Mother   . Alzheimer's disease Sister      Social History   Socioeconomic History  . Marital status: Married    Spouse name: Not on file  . Number of children: 3  . Years of education: Not on file  . Highest education level: Not on file  Occupational History  . Occupation: Retired    Fish farm manager: RETIRED  Social Needs  . Financial resource strain: Not on file  . Food insecurity:    Worry: Not on file    Inability: Not on file  . Transportation needs:    Medical: Not on file    Non-medical: Not on file  Tobacco Use  . Smoking status: Former Smoker    Packs/day: 1.00    Years: 23.00    Pack years: 23.00    Last attempt to quit: 1978    Years since quitting: 41.3  . Smokeless tobacco: Never Used  Substance and Sexual Activity  . Alcohol use: No    Comment: rare  . Drug use: No  . Sexual activity: Not on file  Lifestyle  . Physical activity:    Days per week: Not on file    Minutes per session: Not on file  . Stress: Not on file  Relationships  . Social connections:    Talks on phone: Not on file    Gets together: Not on file    Attends religious service: Not on file    Active member of club or organization: Not on file    Attends meetings of clubs or organizations: Not on file    Relationship status: Not on file  . Intimate partner violence:    Fear of current or ex partner: Not on file    Emotionally abused: Not on file    Physically abused: Not on file    Forced sexual activity: Not on file  Other Topics Concern  . Not on file  Social History Narrative  . Not on file     BP (!) 88/52   Pulse 88   Ht 5\' 8"  (1.727 m)   Wt 153 lb (69.4 kg)    BMI 23.26 kg/m   Physical Exam:  Well appearing 84 yo man, NAD HEENT: Unremarkable Neck:  6 cm JVD, no thyromegally Lymphatics:  No adenopathy Back:  No CVA tenderness Lungs:  Clear with scattered rales. HEART:  IRegular rate rhythm, no murmurs, no rubs, no clicks Abd:  soft, positive bowel sounds, no organomegally, no rebound, no guarding Ext:  2 plus pulses, no edema, no  cyanosis, no clubbing Skin:  No rashes no nodules Neuro:  CN II through XII intact, motor grossly intact  EKG - atrial fib  Lexiscan - 2 different perfusion defects suggestive of ischemia  Assess/Plan: 1. Exertional dyspnea in the setting of multiple perfusion defects on stress testing - I have recommended her proceed with left and right heart cath. I discussed the indications/risks/benefits/goals/expectations and he wishes to proceed. 2. Atrial fib - his rate is well controlled.  3. Lung CA - he is s/p treatment. He has known fibrosis. We will follow.  Mikle Bosworth.D.

## 2017-06-15 NOTE — H&P (View-Only) (Signed)
HPI Raymond Logan returns today for followup the results of his stress test in the setting of unexplained exertional dyspnea. His symptoms have not changed. He does not have angina although I suspect his sob is his anginal equivalent. He previously was quite healthy.  Allergies  Allergen Reactions  . Alfuzosin Other (See Comments)    BP went crazy, dizzy, passing out   . Aricept [Donepezil Hcl] Other (See Comments)    Insomnia, headache  . Penicillins Hives    PATIENT HAS HAD A PCN REACTION WITH IMMEDIATE RASH, FACIAL/TONGUE/THROAT SWELLING, SOB, OR LIGHTHEADEDNESS WITH HYPOTENSION:  #  #  #  YES  #  #  #   Has patient had a PCN reaction causing severe rash involving mucus membranes or skin necrosis:No Has patient had a PCN reaction that required hospitalization:No Has patient had a PCN reaction occurring within the last 10 years:No If all of the above answers are "NO", then may proceed with Cephalosporin use.      Current Outpatient Medications  Medication Sig Dispense Refill  . apixaban (ELIQUIS) 5 MG TABS tablet Take 1 tablet (5 mg total) by mouth 2 (two) times daily. 180 tablet 3  . calcium carbonate (TUMS - DOSED IN MG ELEMENTAL CALCIUM) 500 MG chewable tablet Chew 1 tablet by mouth 3 (three) times daily as needed (for acid reflux.).    [name] finasteride (PROSCAR) 5 MG tablet Take 5 mg by mouth at bedtime.     . fluticasone (FLONASE) 50 MCG/ACT nasal spray SHAKE LQ AND U 2 SPRAYS IEN D  11  . silodosin (RAPAFLO) 8 MG CAPS capsule Take 8 mg by mouth at bedtime.     . traMADol (ULTRAM) 50 MG tablet Take 50 mg by mouth every 6 (six) hours as needed for moderate pain.    [name] dextromethorphan (DELSYM) 30 MG/5ML liquid Take 5 mLs (30 mg total) by mouth 2 (two) times daily. (Patient not taking: Reported on 06/15/2017) 89 mL 5   No current facility-administered medications for this visit.      Past Medical History:  Diagnosis Date  . ALLERGIC RHINITIS 01/23/2007  . Arthritis    hands  . BENIGN PROSTATIC HYPERTROPHY 09/10/2006  . Chronic headaches   . Complication of anesthesia    difficulty voiding- if cath needed needs to placed with need to be done with scope  . COPD, MILD 04/03/2010   patient denies on preop visit of 08/02/2014   . Difficulty in urination   . FATIGUE 01/08/2008  . GERD 04/15/2010  . H. pylori infection    hx of   . Headache(784.0) 09/10/2006  . HOARSENESS 04/15/2010  . HYPERLIPIDEMIA 09/10/2006  . INSOMNIA-SLEEP DISORDER-UNSPEC 04/24/2009  . PEPTIC ULCER DISEASE 01/23/2007  . Pneumonia    1953    ROS:   All systems reviewed and negative except as noted in the HPI.   Past Surgical History:  Procedure Laterality Date  . CATARACT EXTRACTION Left   . COLONOSCOPY    . CYSTOSCOPY N/A 10/07/2016   Procedure: CYSTOSCOPY;  Surgeon: Festus Aloe, MD;  Location: [place];  Service: Urology;  Laterality: N/A;  . HEMORRHOID BANDING    . INSERTION OF SUPRAPUBIC CATHETER N/A 10/07/2016   Procedure: FOLEY  CATHETER PLACEMENT;  Surgeon: Festus Aloe, MD;  Location: [facility];  Service: Urology;  Laterality: N/A;  . KNEE ARTHROSCOPY Left    torn meniscus  . LUMBAR LAMINECTOMY/DECOMPRESSION MICRODISCECTOMY Right 08/07/2014   Procedure: complete DECOMPRESSION LUMBAR LAMINECTOMY/MICRODISCECTOMY OF  L4-L5 for spinal stenosis, DECOMPRESSION OF L3-L4;  Surgeon: Latanya Maudlin, MD;  Location: [facility];  Service: Orthopedics;  Laterality: Right;  . ROTATOR CUFF REPAIR Right   . SHOULDER ARTHROSCOPY     x3, L & R  . VIDEO ASSISTED THORACOSCOPY (VATS)/ LOBECTOMY Right 10/07/2016   Procedure: VIDEO ASSISTED THORACOSCOPY (VATS)/RIGHT MIDDLE LOBECTOMY;  Surgeon: [name], MD;  Location: [facility];  Service: Thoracic;  Laterality: Right;  [name] VIDEO BRONCHOSCOPY WITH ENDOBRONCHIAL NAVIGATION N/A 09/23/2016   Procedure: VIDEO BRONCHOSCOPY WITH ENDOBRONCHIAL NAVIGATION;  Surgeon: [name], MD;  Location: [place];  Service: Thoracic;  Laterality: N/A;  . VIDEO  BRONCHOSCOPY WITH ENDOBRONCHIAL ULTRASOUND N/A 09/23/2016   Procedure: VIDEO BRONCHOSCOPY WITH ENDOBRONCHIAL ULTRASOUND;  Surgeon: [name], MD;  Location: [facility];  Service: Thoracic;  Laterality: N/A;     Family History  Problem Relation Age of Onset  . Prostate cancer Brother   . Alzheimer's disease Mother   . Alzheimer's disease Sister      Social History   Socioeconomic History  . Marital status: Married    Spouse name: Not on file  . Number of children: 3  . Years of education: Not on file  . Highest education level: Not on file  Occupational History  . Occupation: Retired    Fish farm manager: RETIRED  Social Needs  . Financial resource strain: Not on file  . Food insecurity:    Worry: Not on file    Inability: Not on file  . Transportation needs:    Medical: Not on file    Non-medical: Not on file  Tobacco Use  . Smoking status: Former Smoker    Packs/day: 1.00    Years: 23.00    Pack years: 23.00    Last attempt to quit: 1978    Years since quitting: 41.3  . Smokeless tobacco: Never Used  Substance and Sexual Activity  . Alcohol use: No    Comment: rare  . Drug use: No  . Sexual activity: Not on file  Lifestyle  . Physical activity:    Days per week: Not on file    Minutes per session: Not on file  . Stress: Not on file  Relationships  . Social connections:    Talks on phone: Not on file    Gets together: Not on file    Attends religious service: Not on file    Active member of club or organization: Not on file    Attends meetings of clubs or organizations: Not on file    Relationship status: Not on file  . Intimate partner violence:    Fear of current or ex partner: Not on file    Emotionally abused: Not on file    Physically abused: Not on file    Forced sexual activity: Not on file  Other Topics Concern  . Not on file  Social History Narrative  . Not on file     BP (!) 88/52   Pulse 88   Ht 5\' 8"  (1.727 m)   Wt 153 lb (69.4 kg)    BMI 23.26 kg/m   Physical Exam:  Well appearing 84 yo man, NAD HEENT: Unremarkable Neck:  6 cm JVD, no thyromegally Lymphatics:  No adenopathy Back:  No CVA tenderness Lungs:  Clear with scattered rales. HEART:  IRegular rate rhythm, no murmurs, no rubs, no clicks Abd:  soft, positive bowel sounds, no organomegally, no rebound, no guarding Ext:  2 plus pulses, no edema, no  cyanosis, no clubbing Skin:  No rashes no nodules Neuro:  CN II through XII intact, motor grossly intact  EKG - atrial fib  Lexiscan - 2 different perfusion defects suggestive of ischemia  Assess/Plan: 1. Exertional dyspnea in the setting of multiple perfusion defects on stress testing - I have recommended her proceed with left and right heart cath. I discussed the indications/risks/benefits/goals/expectations and he wishes to proceed. 2. Atrial fib - his rate is well controlled.  3. Lung CA - he is s/p treatment. He has known fibrosis. We will follow.  Mikle Bosworth.D.

## 2017-06-15 NOTE — Patient Instructions (Signed)
Medication Instructions:  Your physician recommends that you continue on your current medications as directed. Please refer to the Current Medication list given to you today.  Labwork: You will get lab work today:  BMP and CBC  Testing/Procedures: Your physician has requested that you have a cardiac catheterization. Cardiac catheterization is used to diagnose and/or treat various heart conditions. Doctors may recommend this procedure for a number of different reasons. The most common reason is to evaluate chest pain. Chest pain can be a symptom of coronary artery disease (CAD), and cardiac catheterization can show whether plaque is narrowing or blocking your heart's arteries. This procedure is also used to evaluate the valves, as well as measure the blood flow and oxygen levels in different parts of your heart. For further information please visit [email or web address]. Please follow instruction sheet, as given.  Follow-Up: Your physician wants you to follow-up in depending on results of your cardiac cath.  I will contact you after your procedure.  Any Other Special Instructions Will Be Listed Below (If Applicable).  If you need a refill on your cardiac medications before your next appointment, please call your pharmacy.    Coronary Angiogram With Stent Coronary angiogram with stent placement is a procedure to widen or open a narrow blood vessel of the heart (coronary artery). Arteries may become blocked by cholesterol buildup (plaques) in the lining of the wall. When a coronary artery becomes partially blocked, blood flow to that area decreases. This may lead to chest pain or a heart attack (myocardial infarction). A stent is a small piece of metal that looks like mesh or a spring. Stent placement may be done as treatment for a heart attack or right after a coronary angiogram in which a blocked artery is found. Let your health care provider know about:  Any allergies you have.  All medicines  you are taking, including vitamins, herbs, eye drops, creams, and over-the-counter medicines.  Any problems you or family members have had with anesthetic medicines.  Any blood disorders you have.  Any surgeries you have had.  Any medical conditions you have.  Whether you are pregnant or may be pregnant. What are the risks? Generally, this is a safe procedure. However, problems may occur, including:  Damage to the heart or its blood vessels.  A return of blockage.  Bleeding, infection, or bruising at the insertion site.  A collection of blood under the skin (hematoma) at the insertion site.  A blood clot in another part of the body.  Kidney injury.  Allergic reaction to the dye or contrast that is used.  Bleeding into the abdomen (retroperitoneal bleeding).  What happens before the procedure? Staying hydrated Follow instructions from your health care provider about hydration, which may include:  Up to 2 hours before the procedure - you may continue to drink clear liquids, such as water, clear fruit juice, black coffee, and plain tea.  Eating and drinking restrictions Follow instructions from your health care provider about eating and drinking, which may include:  8 hours before the procedure - stop eating heavy meals or foods such as meat, fried foods, or fatty foods.  6 hours before the procedure - stop eating light meals or foods, such as toast or cereal.  2 hours before the procedure - stop drinking clear liquids.  Ask your health care provider about:  Changing or stopping your regular medicines. This is especially important if you are taking diabetes medicines or blood thinners.  Taking medicines such as ibuprofen.  These medicines can thin your blood. Do not take these medicines before your procedure if your health care provider instructs you not to. Generally, aspirin is recommended before a procedure of passing a small, thin tube (catheter) through a blood  vessel and into the heart (cardiac catheterization).  What happens during the procedure?  An IV tube will be inserted into one of your veins.  You will be given one or more of the following: ? A medicine to help you relax (sedative). ? A medicine to numb the area where the catheter will be inserted into an artery (local anesthetic).  To reduce your risk of infection: ? Your health care team will wash or sanitize their hands. ? Your skin will be washed with soap. ? Hair may be removed from the area where the catheter will be inserted.  Using a guide wire, the catheter will be inserted into an artery. The location may be in your groin, in your wrist, or in the fold of your arm (near your elbow).  A type of X-ray (fluoroscopy) will be used to help guide the catheter to the opening of the arteries in the heart.  A dye will be injected into the catheter, and X-rays will be taken. The dye will help to show where any narrowing or blockages are located in the arteries.  A tiny wire will be guided to the blocked spot, and a balloon will be inflated to make the artery wider.  The stent will be expanded and will crush the plaques into the wall of the vessel. The stent will hold the area open and improve the blood flow. Most stents have a drug coating to reduce the risk of the stent narrowing over time.  The artery may be made wider using a drill, laser, or other tools to remove plaques.  When the blood flow is better, the catheter will be removed. The lining of the artery will grow over the stent, which stays where it was placed. This procedure may vary among health care providers and hospitals. What happens after the procedure?  If the procedure is done through the leg, you will be kept in bed lying flat for about 6 hours. You will be instructed to not bend and not cross your legs.  The insertion site will be checked frequently.  The pulse in your foot or wrist will be checked  frequently.  You may have additional blood tests, X-rays, and a test that records the electrical activity of your heart (electrocardiogram, or ECG). This information is not intended to replace advice given to you by your health care provider. Make sure you discuss any questions you have with your health care provider. Document Released: 08/22/2002 Document Revised: 10/16/2015 Document Reviewed: 09/21/2015 Elsevier Interactive Patient Education  [employer].

## 2017-06-20 ENCOUNTER — Telehealth: Payer: Self-pay | Admitting: *Deleted

## 2017-06-20 NOTE — Telephone Encounter (Signed)
Pt contacted pre-catheterization scheduled at [facility] for: Tuesday April 23,2019 7:30 AM Verified arrival time and place: [place] [facility] at: 5:30 AM Nothing to eat or drink after midnight prior to cath. Verified allergies in Epic. Verified no diabetes medications.  Hold: Pt had been instructed to take last dose of Eliquis 06/18/17 until post cath. Pt states he did take Eliquis 06/19/17 but did not take any today. I have instructed pt not to take Eliquis until post cath, he verbalized understanding. I will send a staff message to Dr Tamala Julian to inform him pt's last dose of Eliquis was 06/19/17 about 6 PM.  Except hold medications AM meds can be  taken pre-cath with sip of water including: ASA 81 mg  Confirmed patient has responsible person to drive home post procedure and observe patient for 24 hours: yes

## 2017-06-20 NOTE — Telephone Encounter (Signed)
Per Dr Miki Kins cath to later in the day since last dose of Eliquis was 06/19/17 about 6 PM.   Cath rescheduled for 06/21/17 12:30 PM, arrive 10:30 AM, I spoke with patient's wife, she is aware of time change, she will make patient and patient's daughter aware.

## 2017-06-21 ENCOUNTER — Encounter (HOSPITAL_COMMUNITY)
Admission: RE | Disposition: A | Discharge status: Home / Self Care | Payer: Self-pay | Source: Ambulatory Visit | Attending: Interventional Cardiology

## 2017-06-21 ENCOUNTER — Ambulatory Visit (HOSPITAL_COMMUNITY)
Admission: RE | Admit: 2017-06-21 | Discharge: 2017-06-21 | Disposition: A | Discharge status: Home / Self Care | Payer: Medicare Other | Source: Ambulatory Visit | Attending: Interventional Cardiology | Admitting: Interventional Cardiology

## 2017-06-21 DIAGNOSIS — Z7951 Long term (current) use of inhaled steroids: Secondary | ICD-10-CM | POA: Insufficient documentation

## 2017-06-21 DIAGNOSIS — K219 Gastro-esophageal reflux disease without esophagitis: Secondary | ICD-10-CM | POA: Diagnosis not present

## 2017-06-21 DIAGNOSIS — N400 Enlarged prostate without lower urinary tract symptoms: Secondary | ICD-10-CM | POA: Insufficient documentation

## 2017-06-21 DIAGNOSIS — R0600 Dyspnea, unspecified: Secondary | ICD-10-CM

## 2017-06-21 DIAGNOSIS — Z7901 Long term (current) use of anticoagulants: Secondary | ICD-10-CM | POA: Diagnosis not present

## 2017-06-21 DIAGNOSIS — Z880 Allergy status to penicillin: Secondary | ICD-10-CM | POA: Insufficient documentation

## 2017-06-21 DIAGNOSIS — E785 Hyperlipidemia, unspecified: Secondary | ICD-10-CM | POA: Diagnosis present

## 2017-06-21 DIAGNOSIS — J449 Chronic obstructive pulmonary disease, unspecified: Secondary | ICD-10-CM | POA: Diagnosis not present

## 2017-06-21 DIAGNOSIS — R0609 Other forms of dyspnea: Secondary | ICD-10-CM | POA: Diagnosis not present

## 2017-06-21 DIAGNOSIS — I4891 Unspecified atrial fibrillation: Secondary | ICD-10-CM

## 2017-06-21 DIAGNOSIS — Z888 Allergy status to other drugs, medicaments and biological substances status: Secondary | ICD-10-CM | POA: Diagnosis not present

## 2017-06-21 DIAGNOSIS — I480 Paroxysmal atrial fibrillation: Secondary | ICD-10-CM | POA: Diagnosis present

## 2017-06-21 DIAGNOSIS — Z87891 Personal history of nicotine dependence: Secondary | ICD-10-CM | POA: Insufficient documentation

## 2017-06-21 DIAGNOSIS — Z79899 Other long term (current) drug therapy: Secondary | ICD-10-CM | POA: Insufficient documentation

## 2017-06-21 DIAGNOSIS — R9439 Abnormal result of other cardiovascular function study: Secondary | ICD-10-CM

## 2017-06-21 HISTORY — PX: RIGHT/LEFT HEART CATH AND CORONARY ANGIOGRAPHY: CATH118266

## 2017-06-21 LAB — POCT I-STAT 3, ART BLOOD GAS (G3+)
Acid-base deficit: 1 mmol/L (ref 0.0–2.0)
BICARBONATE: 24.9 mmol/L (ref 20.0–28.0)
O2 Saturation: 100 %
PCO2 ART: 44.1 mmHg (ref 32.0–48.0)
TCO2: 26 mmol/L (ref 22–32)
pH, Arterial: 7.359 (ref 7.350–7.450)
pO2, Arterial: 187 mmHg — ABNORMAL HIGH (ref 83.0–108.0)

## 2017-06-21 LAB — POCT I-STAT 3, VENOUS BLOOD GAS (G3P V)
Acid-base deficit: 1 mmol/L (ref 0.0–2.0)
BICARBONATE: 25.4 mmol/L (ref 20.0–28.0)
BICARBONATE: 26.1 mmol/L (ref 20.0–28.0)
Bicarbonate: 25.1 mmol/L (ref 20.0–28.0)
Bicarbonate: 25.5 mmol/L (ref 20.0–28.0)
O2 SAT: 79 %
O2 Saturation: 78 %
O2 Saturation: 78 %
O2 Saturation: 80 %
PCO2 VEN: 47.7 mmHg (ref 44.0–60.0)
PH VEN: 7.347 (ref 7.250–7.430)
PO2 VEN: 45 mmHg (ref 32.0–45.0)
PO2 VEN: 47 mmHg — AB (ref 32.0–45.0)
TCO2: 27 mmol/L (ref 22–32)
TCO2: 27 mmol/L (ref 22–32)
TCO2: 27 mmol/L (ref 22–32)
TCO2: 28 mmol/L (ref 22–32)
pCO2, Ven: 45.8 mmHg (ref 44.0–60.0)
pCO2, Ven: 46.3 mmHg (ref 44.0–60.0)
pCO2, Ven: 47 mmHg (ref 44.0–60.0)
pH, Ven: 7.33 (ref 7.250–7.430)
pH, Ven: 7.353 (ref 7.250–7.430)
pH, Ven: 7.353 (ref 7.250–7.430)
pO2, Ven: 45 mmHg (ref 32.0–45.0)
pO2, Ven: 47 mmHg — ABNORMAL HIGH (ref 32.0–45.0)

## 2017-06-21 SURGERY — RIGHT/LEFT HEART CATH AND CORONARY ANGIOGRAPHY
Anesthesia: LOCAL

## 2017-06-21 MED ORDER — HEPARIN (PORCINE) IN NACL 1000-0.9 UT/500ML-% IV SOLN
INTRAVENOUS | Status: AC
  Filled 2017-06-21: qty 500

## 2017-06-21 MED ORDER — LIDOCAINE HCL (PF) 1 % IJ SOLN
INTRAMUSCULAR | Status: AC
  Filled 2017-06-21: qty 30

## 2017-06-21 MED ORDER — HEPARIN (PORCINE) IN NACL 2-0.9 UNITS/ML
INTRAMUSCULAR | Status: AC | PRN
  Administered 2017-06-21: 500 mL

## 2017-06-21 MED ORDER — SODIUM CHLORIDE 0.9% FLUSH: 3.0000 mL | INTRAVENOUS | Status: DC | PRN

## 2017-06-21 MED ORDER — SODIUM CHLORIDE 0.9% FLUSH: 3.0000 mL | Freq: Two times a day (BID) | INTRAVENOUS | Status: DC

## 2017-06-21 MED ORDER — MIDAZOLAM HCL 2 MG/2ML IJ SOLN
INTRAMUSCULAR | Status: AC
  Filled 2017-06-21: qty 2

## 2017-06-21 MED ORDER — HEPARIN SODIUM (PORCINE) 1000 UNIT/ML IJ SOLN
INTRAMUSCULAR | Status: AC
  Filled 2017-06-21: qty 1

## 2017-06-21 MED ORDER — ACETAMINOPHEN 325 MG PO TABS: 650.0000 mg | ORAL_TABLET | ORAL | Status: DC | PRN

## 2017-06-21 MED ORDER — HEPARIN SODIUM (PORCINE) 1000 UNIT/ML IJ SOLN
INTRAMUSCULAR | Status: DC | PRN
  Administered 2017-06-21: 3500 [IU] via INTRAVENOUS

## 2017-06-21 MED ORDER — MIDAZOLAM HCL 2 MG/2ML IJ SOLN
INTRAMUSCULAR | Status: DC | PRN
  Administered 2017-06-21: 1 mg via INTRAVENOUS

## 2017-06-21 MED ORDER — OXYCODONE HCL 5 MG PO TABS: 5.0000 mg | ORAL_TABLET | ORAL | Status: DC | PRN

## 2017-06-21 MED ORDER — ONDANSETRON HCL 4 MG/2ML IJ SOLN: 4.0000 mg | Freq: Four times a day (QID) | INTRAMUSCULAR | Status: DC | PRN

## 2017-06-21 MED ORDER — VERAPAMIL HCL 2.5 MG/ML IV SOLN
INTRAVENOUS | Status: AC
  Filled 2017-06-21: qty 2

## 2017-06-21 MED ORDER — SODIUM CHLORIDE 0.9 % WEIGHT BASED INFUSION
3.0000 mL/kg/h | INTRAVENOUS | Status: AC
  Administered 2017-06-21: 3 mL/kg/h via INTRAVENOUS

## 2017-06-21 MED ORDER — SODIUM CHLORIDE 0.9 % IV SOLN: 250.0000 mL | INTRAVENOUS | Status: DC | PRN

## 2017-06-21 MED ORDER — FENTANYL CITRATE (PF) 100 MCG/2ML IJ SOLN
INTRAMUSCULAR | Status: AC
  Filled 2017-06-21: qty 2

## 2017-06-21 MED ORDER — SODIUM CHLORIDE 0.9 % IV SOLN: INTRAVENOUS | Status: DC

## 2017-06-21 MED ORDER — VERAPAMIL HCL 2.5 MG/ML IV SOLN
INTRAVENOUS | Status: DC | PRN
  Administered 2017-06-21: 10 mL via INTRA_ARTERIAL

## 2017-06-21 MED ORDER — HEPARIN (PORCINE) IN NACL 1000-0.9 UT/500ML-% IV SOLN
INTRAVENOUS | Status: AC
  Filled 2017-06-21: qty 1000

## 2017-06-21 MED ORDER — ASPIRIN 81 MG PO CHEW: 81.0000 mg | CHEWABLE_TABLET | ORAL | Status: DC

## 2017-06-21 MED ORDER — IOHEXOL 350 MG/ML SOLN
INTRAVENOUS | Status: DC | PRN
  Administered 2017-06-21: 35 mL via INTRA_ARTERIAL

## 2017-06-21 MED ORDER — FENTANYL CITRATE (PF) 100 MCG/2ML IJ SOLN
INTRAMUSCULAR | Status: DC | PRN
  Administered 2017-06-21: 25 ug via INTRAVENOUS

## 2017-06-21 MED ORDER — SODIUM CHLORIDE 0.9 % WEIGHT BASED INFUSION: 1.0000 mL/kg/h | INTRAVENOUS | Status: DC

## 2017-06-21 MED ORDER — LIDOCAINE HCL (PF) 1 % IJ SOLN
INTRAMUSCULAR | Status: DC | PRN
  Administered 2017-06-21: 3 mL
  Administered 2017-06-21: 4 mL

## 2017-06-21 SURGICAL SUPPLY — 16 items
BAND CMPR LRG ZPHR (HEMOSTASIS) ×1
BAND ZEPHYR COMPRESS 30 LONG (HEMOSTASIS) ×1 IMPLANT
CATH BALLN WEDGE 5F 110CM (CATHETERS) ×1 IMPLANT
CATH INFINITI 5 FR JL3.5 (CATHETERS) ×1 IMPLANT
CATH INFINITI JR4 5F (CATHETERS) ×1 IMPLANT
COVER PRB 48X5XTLSCP FOLD TPE (BAG) IMPLANT
COVER PROBE 5X48 (BAG) ×2
GLIDESHEATH SLEND A-KIT 6F 22G (SHEATH) ×1 IMPLANT
GUIDEWIRE .025 260CM (WIRE) ×1 IMPLANT
GUIDEWIRE INQWIRE 1.5J.035X260 (WIRE) IMPLANT
INQWIRE 1.5J .035X260CM (WIRE) ×2
KIT HEART LEFT (KITS) ×2 IMPLANT
PACK CARDIAC CATHETERIZATION (CUSTOM PROCEDURE TRAY) ×2 IMPLANT
SHEATH GLIDE SLENDER 4/5FR (SHEATH) ×1 IMPLANT
TRANSDUCER W/STOPCOCK (MISCELLANEOUS) ×2 IMPLANT
TUBING CIL FLEX 10 FLL-RA (TUBING) ×2 IMPLANT

## 2017-06-21 NOTE — Interval H&P Note (Signed)
Cath Lab Visit (complete for each Cath Lab visit)  Clinical Evaluation Leading to the Procedure:   ACS: No.  Non-ACS:    Anginal Classification: CCS Logan  Anti-ischemic medical therapy: Minimal Therapy (1 class of medications)  Non-Invasive Test Results: Intermediate-risk stress test findings: cardiac mortality 1-3%/year  Prior CABG: No previous CABG      History and Physical Interval Note:  06/21/2017 11:50 AM  Raymond Logan  has presented today for surgery, with the diagnosis of dyspnea upon excertion, abnormal stres test  The various methods of treatment have been discussed with the patient and family. After consideration of risks, benefits and other options for treatment, the patient has consented to  Procedure(s): RIGHT/LEFT HEART CATH AND CORONARY ANGIOGRAPHY (N/A) as a surgical intervention .  The patient's history has been reviewed, patient examined, no change in status, stable for surgery.  I have reviewed the patient's chart and labs.  Questions were answered to the patient's satisfaction.     Raymond Logan

## 2017-06-21 NOTE — Discharge Instructions (Signed)

## 2017-06-22 ENCOUNTER — Encounter (HOSPITAL_COMMUNITY): Payer: Self-pay | Admitting: Interventional Cardiology

## 2017-06-22 ENCOUNTER — Ambulatory Visit: Payer: Self-pay | Admitting: *Deleted

## 2017-06-22 NOTE — Telephone Encounter (Signed)
Pt reports severe, sudden headache, onset 0200, "Worse headache ever, never anything like this." Took ES tylenol, ineffective.  Pt had heart cath yesterday for new onset A-Fib.  Denies CP, moderate SOB, pt states "Not new, I need sinus surgery." Denies CP, no one sided weakness, no facial droop, speech clear. Denies dizziness but pt has not been OOB as of yet.  Wife states "Lying there holding his head."  Directed to ED, wife can not transport, will call 911. Care advise given per protocol. Reason for Disposition . [1] SEVERE headache (e.g., excruciating) AND [2] "worst headache" of life  Answer Assessment - Initial Assessment Questions 1. LOCATION: "Where does it hurt?"      Entire head 2. ONSET: "When did the headache start?" (Minutes, hours or days)      0200 3. PATTERN: "Does the pain come and go, or has it been constant since it started?"     constant 4. SEVERITY: "How bad is the pain?" and "What does it keep you from doing?"  (e.g., Scale 1-10; mild, moderate, or severe)   - MILD (1-3): doesn't interfere with normal activities    - MODERATE (4-7): interferes with normal activities or awakens from sleep    - SEVERE (8-10): excruciating pain, unable to do any normal activities        severe 5. RECURRENT SYMPTOM: "Have you ever had headaches before?" If so, ask: "When was the last time?" and "What happened that time?"      "Not like this" 6. CAUSE: "What do you think is causing the headache?"    Unsure, some sinus problems 7. MIGRAINE: "Have you been diagnosed with migraine headaches?" If so, ask: "Is this headache similar?"      no 8. HEAD INJURY: "Has there been any recent injury to the head?"      no 9. OTHER SYMPTOMS: "Do you have any other symptoms?" (fever, stiff neck, eye pain, sore throat, cold symptoms)     no  Protocols used: HEADACHE-A-AH

## 2017-06-22 NOTE — Telephone Encounter (Signed)
FYI

## 2017-06-23 ENCOUNTER — Telehealth: Payer: Self-pay | Admitting: Internal Medicine

## 2017-06-23 NOTE — Telephone Encounter (Signed)
Palliative Care SW phoned patient to set up a visit.  He stated he though he would be available at 1pm on Monday, 4/29.  SW agreed to phone in the morning to verify appointment.

## 2017-06-23 NOTE — Telephone Encounter (Signed)
New message  The original request was sent  06/08/2017 to preop pool. See previous phone encounters   Request for surgical clearance:  1. What type of surgery is being performed? Sinus Surgery   2. When is this surgery scheduled? TBD   3. What type of clearance is required (medical clearance vs. Pharmacy clearance to hold med vs. Both)? Both  4. Are there any medications that need to be held prior to surgery and how long? Eliquis   5. Practice name and name of physician performing surgery? [facility]  6. What is your office phone number [phone]    7.   What is your office fax number [phone]  8.   Anesthesia type (None, local, MAC, general) ? None Specified

## 2017-06-23 NOTE — Telephone Encounter (Signed)
   Primary Cardiologist:Gregg Lovena Le, MD  Chart reviewed as part of pre-operative protocol coverage.   Callback staff, please clarify the type of anesthesia involved in surgery -> patient had recent cath for undetermined etiology of shortness of breath, may need pulm eval as well before clearing for surgery if this requires actual anesthesia.  Will also forward to pharmacy in meantime for Eliquis input.  Charlie Pitter, PA-C 06/23/2017, 4:50 PM

## 2017-06-24 NOTE — Telephone Encounter (Signed)
Per Dr Wilburn Cornelia, under general anesthesia for roughly 30 minutes.

## 2017-06-24 NOTE — Telephone Encounter (Signed)
Dr. Lovena Le, please review this case. Pending sinus surgery under 30 min of general anesthesia. Recent L and R heart cath for persistent dyspnea, normal coronaries, normal R heart pressure. Suspect either pulmonary or deconditioning as etiology. Plan to hold eliquis for 2 days prior to the procedure per pharmacy protocol, but need guidance regarding if additional workup and office visit is warranted in order to clear him for surgery.   Please forward your response to [facility] Preop  Signed, Almyra Deforest PA Pager: [phone]

## 2017-06-24 NOTE — Telephone Encounter (Signed)
Patient with diagnosis of ATRIAL FIBRILLATION on ELIQUIS 5MG  for anticoagulation.    Procedure: SINUS SURGERY Date of procedure: TBD  CHADS2-VASc score of  2 (AGE > 82yo)  *History of malignancy noted* *No hx of VTE/stroke/TIA noted*  CrCl = 56 ML/MIN Platelet count = 340  Per office protocol, patient can hold ELIQUIS for 2 days prior to procedure.  Patient should restart anticoagulation on the evening of procedure or day after, at discretion of procedure MD  Zaylyn Bergdoll Rodriguez-Guzman PharmD, BCPS, [place] [address] [place] [address] 06/24/2017 8:42 AM

## 2017-06-24 NOTE — Telephone Encounter (Signed)
[facility] ENT. Transferred to Dr Victorio Palm assistant's voicemail. Requested anesthesia type and to confirm that Dr Wilburn Cornelia will be doing the surgery. Requested call back.

## 2017-06-25 ENCOUNTER — Telehealth: Payer: Self-pay | Admitting: Licensed Clinical Social Worker

## 2017-06-25 NOTE — Telephone Encounter (Signed)
Palliative Care SW spoke with patient.  Home visit scheduled for Monday, 06/27/17, at 1pm.

## 2017-06-27 ENCOUNTER — Other Ambulatory Visit: Payer: Medicare Other | Admitting: *Deleted

## 2017-06-27 ENCOUNTER — Other Ambulatory Visit: Payer: Medicare Other | Admitting: Licensed Clinical Social Worker

## 2017-06-27 ENCOUNTER — Encounter: Payer: Self-pay | Admitting: Internal Medicine

## 2017-06-27 ENCOUNTER — Ambulatory Visit: Payer: Medicare Other | Admitting: Cardiovascular Disease

## 2017-06-27 ENCOUNTER — Encounter: Payer: Self-pay | Admitting: *Deleted

## 2017-06-27 DIAGNOSIS — Z515 Encounter for palliative care: Secondary | ICD-10-CM

## 2017-06-27 NOTE — Progress Notes (Signed)
COMMUNITY PALLIATIVE CARE RN NOTE  PATIENT NAME: Raymond Logan DOB: 1930-07-07 MRN: [id number]  PRIMARY CARE PROVIDER: Biagio Borg, MD  RESPONSIBLE PARTY:  Acct ID - Guarantor Home Phone Work Phone Relationship Acct Type  [id number] [name]  Self P/F     [address], [place], [place] [address]    PLAN OF CARE and INTERVENTIONS:               1. GOALS OF CARE/ ADVANCE CARE PLANNING: Patient wants to avoid hospitalizations if possible, wants to remain at home               2. PATIENT/CAREGIVER EDUCATION: Energy conservation, recommending utilizing shower chair for safety/dyspnea/fatigue               3. PERSONAL EMERGENCY PLAN: Dyspnea - take frequent rest periods. If breathing worsens patient states he will contact MD               4. DISEASE STATUS: Main complaint is worsening dyspnea with any exertion. Most difficult task for patient to perform is taking a shower. Recommended use of shower chair to conserve energy (patient has one available in home). Patient states that he has had to give up grocery shopping and performing any household chores due to worsening fatigue/dyspnea which has just occurred within the past 2 weeks. Patient used to play 2-3 table tennis matches at the [facility] weekly on Wednesdays, however was unable to attend the past week due to fatigue and the week prior to that had to stop during the middle of the game. Gait steady without use of any assistive devices. Reports that he remains able to drive at this time. Intake consists of 3 Boost supplements daily (completed one during visit.) Also reports eating 3 small meals as well. Patient feels he is much thinner and pants are fitting more loosely. Reports last wt being 147 lbs and has seemed to remain stable over the past month. Memory loss continues to worsen, as patient repeated sentences several times throughout visit and was very forgetful.    HISTORY OF PRESENT ILLNESS: This is an 84 yo male with a  complaint of worsening dyspnea. Patient had a recent R and L cardiac catheterization on 06/21/17 to assess reasons for increased sob/fatigue. Also has persistent cough, requiring the use of cough drops which helps in minimizing cough. Patient also reports occasional headaches, which are not occurring as frequently. Patient states that he has a blockage in his L nostril and is awaiting news as to whether surgery will be scheduled to correct issue.   CODE STATUS: FULL CODE  ADVANCED DIRECTIVES: Per records patient has a HCPOA and Living Will MOST FORM: Per records patient has form in home, however patient and wife were unable to locate today PPS: 60%   PHYSICAL EXAM:   VITALS: Today's Vitals   06/27/17 1556  BP: 92/62  Pulse: 95  Resp: 20  SpO2: 97%  PainSc: 2   PainLoc: Knee    LUNGS: clear to auscultation  CARDIAC: Cor irreg, irreg RRR EXTREMITIES: No edema SKIN: Skin color, texture, turgor normal. No rashes or lesions  NEURO: alert and oriented x 3 (person/place/time), increasing short term memory loss/forgetfulness, pleasant and engaging  (Duration of visit and documentation 90 minutes)     Daryl Eastern, RN BSN

## 2017-06-27 NOTE — Telephone Encounter (Signed)
Discussed with Dr. Lovena Le - ok to proceed with planned procedure from his standpoint. See pharmacist's recommendation regarding holding anticoagulation.   Burtis Junes, RN, [place] [address] [place], [place]  [address] [phone]

## 2017-06-27 NOTE — Progress Notes (Signed)
COMMUNITY PALLIATIVE CARE SW NOTE  PATIENT NAME: [place] DOB: March 24, 1930 MRN: [id number]  PRIMARY CARE PROVIDER: Biagio Borg, MD  RESPONSIBLE PARTY:  Acct ID - Guarantor Home Phone Work Phone Relationship Acct Type  [id number] [place] [id number]  Self P/F     [address], [place], [place] [address]     PLAN OF CARE and INTERVENTIONS:             1. GOALS OF CARE/ ADVANCE CARE PLANNING:  Patient stated he wants to remain at home.  He said he does not wish to have any more surgeries. 2. SOCIAL/EMOTIONAL/SPIRITUAL ASSESSMENT/ INTERVENTIONS:  SW and RN, [place], met with patient and his wife, [place], in their home.  Patient was able to ambulate to the door and back to his recliner, but appeared short of breath after doing so.  Patient was born in raised in [place].  He graduated from [employer].  He was in the [employer] for three years and was stationed in [place].  Patient then worked for the [employer], where he lived for twenty years in [place], [place].  The couple have been married for 17 years and have lived in [place] for over 25 years. The couple have three children.  [place] lives in [place], [place] lives in [facility], and their son, [place], lives in [place].  Patient said their daughters visit them regularly.  Patient displayed a bright affect appropriately.  He was alert and oriented, but provided various information several times.  He also admitted to some memory loss.  Patient's wife could not find words to express herself at times and appeared to have difficulty filtering personal information.  SW provided active listening and supportive counseling while he talked about his lung surgery last August.  They are members of [place].  Patient stated he did not wish to have a parishioner provide communion and has not been able to attend services recently.  He said it was too far to walk during mass.  Patient agreed to a future  visit. 3. PATIENT/CAREGIVER EDUCATION/ COPING:  SW and RN provided education regarding the Palliative Care program which they stated they understood.  Patient appears to cope by expressing his feelings openly.  He is able to tell his wife his needs. 4. PERSONAL EMERGENCY PLAN:  Shortness of Breath - Patient was instructed to continue taking breaks when he exerts himself.   5. COMMUNITY RESOURCES COORDINATION/ HEALTH CARE NAVIGATION:  Both patient and his his wife stated they did not need assistance with community resources. 6. FINANCIAL/LEGAL CONCERNS/INTERVENTIONS:  No legal or financial concerns expressed at this time.     SOCIAL HX:  Social History   Tobacco Use  . Smoking status: Former Smoker    Packs/day: 1.00    Years: 23.00    Pack years: 23.00    Last attempt to quit: 1978    Years since quitting: 41.3  . Smokeless tobacco: Never Used  Substance Use Topics  . Alcohol use: No    Comment: rare    CODE STATUS:  Full Code ADVANCED DIRECTIVES: Patient's chart indicates a Living Will and HCPOA, but the documents were not available. MOST FORM COMPLETE:  Patient said he thought he had a MOST form but did not know where it was.  He said he wanted CPR if his heart stopped. HOSPICE EDUCATION PROVIDED: Not during this visit.  PPS:  Pt ambulates independently and drives.  He said his appetite is normal. Duration  of visit and documentation:  90 minutes      Creola Corn , LCSW

## 2017-06-30 ENCOUNTER — Other Ambulatory Visit: Payer: Self-pay | Admitting: Otolaryngology

## 2017-06-30 ENCOUNTER — Encounter (HOSPITAL_COMMUNITY): Payer: Self-pay | Admitting: *Deleted

## 2017-06-30 ENCOUNTER — Other Ambulatory Visit: Payer: Self-pay

## 2017-06-30 NOTE — Progress Notes (Signed)
Anesthesia Chart Review:  Pt is a same day work up    Case:  [id number] Date/Time:  07/04/17 0715   Procedures:      LEFT ENDOSCOPIC SINUS SURGERY (N/A )     SINUS ENDO WITH FUSION (N/A )   Anesthesia type:  General   Pre-op diagnosis:  CHRONIC SPHENOIDAL SPHENOID SNIUSITIS   Location:  [facility] OR ROOM 02 / [place] [facility]   Surgeon:  Jerrell Belfast, MD      DISCUSSION:  - Pt is an 84 year old male with hx lung cancer.  - Has new onset afib as of 04/2017, on eliquis. Pt to hold eliquis 2 days before surgery.  - Had recent cardiac cath for abnormal stress test that showed normal coronaries.    PROVIDERS: PCP is Biagio Borg, MD   Patient Care Team: Festus Aloe, MD as Consulting Physician (Urology) Harriett Sine, MD as Consulting Physician (Dermatology) Katy Fitch, Darlina Guys, MD as Consulting Physician (Ophthalmology)  Oncologist is Curt Bears, MD.  Pulmologist is Christinia Gully, MD. Last office visit 05/07/17 Cardiologist is Cristopher Peru, MD. Pt cleared for surgery (see telephone encounter 06/24/17 by Almyra Deforest, PA)    LABS: Will be obtained day of surgery    IMAGES:  CT angio chest 04/12/17:  1. No evidence of pulmonary emboli. 2. Radiation fibrosis changes in the right upper and lower lobes and in the right hilum. 3. New slightly enlarged aortopulmonary window and subcarinal lymph nodes. These could be reactive but I cannot exclude metastasis. 4. Two new nodules in the right lung, 1 in the right lower lobe and 1 along the superior aspect of the right major fissure. Given the patient's history, these will need to be followed to exclude metastatic disease.  CXR 03/08/17:  - Atelectasis or infiltrate in the inferior aspect of the right upper lobe or superior segment of the right lower lobe posteriorly which may be post obstructive. Persistent small right pleural effusion. - Thoracic aortic atherosclerosis.   EKG 05/27/17: sinus rhythm with PACs.    CV:  Cardiac cath 06/21/17:    Right dominant coronary anatomy  Normal coronary arteries  Normal right heart pressures  Normal left ventricular pressures.  Low normal LV systolic function, EF at least 50%.  Ventricular ectopy decreased ability to reliably assess global LV function. - RECOMMENDATIONS:  False positive myocardial perfusion imaging given normal coronary arteries.  Explanation for dyspnea is not obvious from the study.  Suspect either pulmonary related or due to deconditioning.  Echo 05/02/17:  - Left ventricle: The cavity size was normal. There was mild focal basal hypertrophy of the septum. Systolic function was normal. The estimated ejection fraction was in the range of 50% to 55%. Wall motion was normal; there were no regional wall motion abnormalities. Doppler parameters are consistent with abnormal left ventricular relaxation (grade 1 diastolic dysfunction). - Mitral valve: Calcified annulus. - Impressions: Low normal LV systolic function; mild diastolic dysfunction.    Past Medical History:  Diagnosis Date  . ALLERGIC RHINITIS 01/23/2007  . Arthritis    hands  . BENIGN PROSTATIC HYPERTROPHY 09/10/2006  . Chronic headaches   . Complication of anesthesia    difficulty voiding- if cath needed needs to placed with need to be done with scope  . COPD, MILD 04/03/2010   patient denies on preop visit of 08/02/2014   . Difficulty in urination   . FATIGUE 01/08/2008  . GERD 04/15/2010  . H. pylori infection    hx of   .  Headache(784.0) 09/10/2006  . HOARSENESS 04/15/2010  . HYPERLIPIDEMIA 09/10/2006  . INSOMNIA-SLEEP DISORDER-UNSPEC 04/24/2009  . Lung cancer ([place])   . PEPTIC ULCER DISEASE 01/23/2007  . Pneumonia    1955    Past Surgical History:  Procedure Laterality Date  . CATARACT EXTRACTION Left   . COLONOSCOPY    . CYSTOSCOPY N/A 10/07/2016   Procedure: CYSTOSCOPY;  Surgeon: Festus Aloe, MD;  Location: [facility];  Service: Urology;  Laterality: N/A;  . HEMORRHOID BANDING    . INSERTION OF  SUPRAPUBIC CATHETER N/A 10/07/2016   Procedure: FOLEY  CATHETER PLACEMENT;  Surgeon: Festus Aloe, MD;  Location: [facility];  Service: Urology;  Laterality: N/A;  . KNEE ARTHROSCOPY Left    torn meniscus  . LUMBAR LAMINECTOMY/DECOMPRESSION MICRODISCECTOMY Right 08/07/2014   Procedure: complete DECOMPRESSION LUMBAR LAMINECTOMY/MICRODISCECTOMY OF L4-L5 for spinal stenosis, DECOMPRESSION OF L3-L4;  Surgeon: Latanya Maudlin, MD;  [facility];  Service: Orthopedics;  Laterality: Right;  . RIGHT/LEFT HEART CATH AND CORONARY ANGIOGRAPHY N/A 06/21/2017   Procedure: RIGHT/LEFT HEART CATH AND CORONARY ANGIOGRAPHY;  Surgeon: Belva Crome, MD;  Location: [facility];  Service: Cardiovascular;  Laterality: N/A;  . ROTATOR CUFF REPAIR Right   . SHOULDER ARTHROSCOPY     x3, L & R  . VIDEO ASSISTED THORACOSCOPY (VATS)/ LOBECTOMY Right 10/07/2016   Procedure: VIDEO ASSISTED THORACOSCOPY (VATS)/RIGHT MIDDLE LOBECTOMY;  Surgeon: [name], MD;  Location: [facility];  Service: Thoracic;  Laterality: Right;  [name] VIDEO BRONCHOSCOPY WITH ENDOBRONCHIAL NAVIGATION N/A 09/23/2016   Procedure: VIDEO BRONCHOSCOPY WITH ENDOBRONCHIAL NAVIGATION;  Surgeon: [name], MD;  Location: [facility];  Service: Thoracic;  Laterality: N/A;  . VIDEO BRONCHOSCOPY WITH ENDOBRONCHIAL ULTRASOUND N/A 09/23/2016   Procedure: VIDEO BRONCHOSCOPY WITH ENDOBRONCHIAL ULTRASOUND;  Surgeon: [name], MD;  [facility];  Service: Thoracic;  Laterality: N/A;    MEDICATIONS: No current facility-administered medications for this encounter.    [name] apixaban (ELIQUIS) 5 MG TABS tablet  . calcium carbonate (TUMS - DOSED IN MG ELEMENTAL CALCIUM) 500 MG chewable tablet  . dextromethorphan (DELSYM) 30 MG/5ML liquid  . diclofenac sodium (VOLTAREN) 1 % GEL  . finasteride (PROSCAR) 5 MG tablet  . fluticasone (FLONASE) 50 MCG/ACT nasal spray  . Melatonin 10 MG CAPS  . silodosin (RAPAFLO) 8 MG CAPS capsule  . traMADol  (ULTRAM) 50 MG tablet   Pt to hold eliquis 2 days before surgery  If labs acceptable day of surgery, I anticipate pt can proceed with surgery as scheduled.  Willeen Cass, FNP-BC [facility]/Anesthesiology Phone: [phone] 06/30/2017 4:25 PM

## 2017-06-30 NOTE — Progress Notes (Signed)
Pt denies any acute cardiopulmonary issues. Pt under the care of Dr. Lovena Le, Cardiology. Pt stated that he was instructed to stop Eliquis on Friday night. Pt made aware to stop taking vitamins, fish oil, Melatonin and herbal medications. Do not take any NSAIDs ie: Ibuprofen, Advil, Naproxen (Aleve), Motrin, BC and Goody Powder and Voltaren Gel. Pt verbalize understanding of all pre-op instructions.

## 2017-07-02 DIAGNOSIS — R69 Illness, unspecified: Secondary | ICD-10-CM | POA: Diagnosis not present

## 2017-07-03 NOTE — Anesthesia Preprocedure Evaluation (Addendum)
Anesthesia Evaluation  Patient identified by MRN, date of birth, ID band Patient awake    Reviewed: Allergy & Precautions, H&P , NPO status , Patient's Chart, lab work & pertinent test results, reviewed documented beta blocker date and time   History of Anesthesia Complications Negative for: history of anesthetic complications  Airway Mallampati: II  TM Distance: >3 FB Neck ROM: Full    Dental  (+) Caps, Dental Advisory Given   Pulmonary COPD, former smoker,  S/p Rt Middle Lobectomy for lung cancer   breath sounds clear to auscultation       Cardiovascular + DOE  + dysrhythmias Atrial Fibrillation  Rhythm:Regular Rate:Normal  '19 Cath - Right dominant coronary anatomy Normal coronary arteries Normal right heart pressures Normal left ventricular pressures.  Low normal LV systolic function, EF at least 50%.  Ventricular ectopy decreased ability to reliably assess global LV function.  '19 TTE - Mild focal basal hypertrophy of the septum. EF 50% to 55%. Grade 1 diastolic dysfunction.   Neuro/Psych  Headaches, negative psych ROS   GI/Hepatic Neg liver ROS, PUD, GERD  Controlled,  Endo/Other  negative endocrine ROS  Renal/GU negative Renal ROS   BPH; hx difficult catheritization    Musculoskeletal  (+) Arthritis ,   Abdominal   Peds  Hematology negative hematology ROS (+)   Anesthesia Other Findings Chronic left sphenoid sinusitis  Reproductive/Obstetrics negative OB ROS                            Anesthesia Physical  Anesthesia Plan  ASA: III  Anesthesia Plan: General   Post-op Pain Management:    Induction: Intravenous  PONV Risk Score and Plan: 3 and Ondansetron, Dexamethasone, Propofol and Treatment may vary due to age or medical condition  Airway Management Planned: Oral ETT  Additional Equipment: None  Intra-op Plan:   Post-operative Plan: Extubation in  OR  Informed Consent: I have reviewed the patients History and Physical, chart, labs and discussed the procedure including the risks, benefits and alternatives for the proposed anesthesia with the patient or authorized representative who has indicated his/her understanding and acceptance.   Dental advisory given  Plan Discussed with: CRNA and Anesthesiologist  Anesthesia Plan Comments: (  )        Anesthesia Quick Evaluation                                   Anesthesia Evaluation  Patient identified by MRN, date of birth, ID band Patient awake    Reviewed: Allergy & Precautions, H&P , Patient's Chart, lab work & pertinent test results, reviewed documented beta blocker date and time   Airway Mallampati: II  TM Distance: >3 FB Neck ROM: full    Dental no notable dental hx. (+) Caps, Dental Advisory Given   Pulmonary former smoker,    Pulmonary exam normal breath sounds clear to auscultation       Cardiovascular  Rhythm:regular Rate:Normal     Neuro/Psych    GI/Hepatic   Endo/Other    Renal/GU      Musculoskeletal   Abdominal   Peds  Hematology   Anesthesia Other Findings   Reproductive/Obstetrics                            Anesthesia Physical Anesthesia Plan  ASA: III  Anesthesia Plan:  General   Post-op Pain Management:    Induction: Intravenous  PONV Risk Score and Plan: 2 and Ondansetron, Dexamethasone, Propofol and Treatment may vary due to age or medical condition  Airway Management Planned: Oral ETT  Additional Equipment:   Intra-op Plan:   Post-operative Plan: Extubation in OR  Informed Consent: I have reviewed the patients History and Physical, chart, labs and discussed the procedure including the risks, benefits and alternatives for the proposed anesthesia with the patient or authorized representative who has indicated his/her understanding and acceptance.   Dental Advisory Given  Plan  Discussed with: CRNA and Surgeon  Anesthesia Plan Comments: (  )        Anesthesia Quick Evaluation

## 2017-07-04 ENCOUNTER — Ambulatory Visit (HOSPITAL_COMMUNITY): Payer: Medicare Other | Admitting: Emergency Medicine

## 2017-07-04 ENCOUNTER — Ambulatory Visit (HOSPITAL_COMMUNITY)
Admission: RE | Admit: 2017-07-04 | Discharge: 2017-07-04 | Disposition: A | Discharge status: Home / Self Care | Payer: Medicare Other | Source: Ambulatory Visit | Attending: Otolaryngology | Admitting: Otolaryngology

## 2017-07-04 ENCOUNTER — Encounter (HOSPITAL_COMMUNITY): Payer: Self-pay

## 2017-07-04 ENCOUNTER — Encounter (HOSPITAL_COMMUNITY)
Admission: RE | Disposition: A | Discharge status: Home / Self Care | Payer: Self-pay | Source: Ambulatory Visit | Attending: Otolaryngology

## 2017-07-04 ENCOUNTER — Other Ambulatory Visit: Payer: Self-pay

## 2017-07-04 DIAGNOSIS — E785 Hyperlipidemia, unspecified: Secondary | ICD-10-CM | POA: Diagnosis not present

## 2017-07-04 DIAGNOSIS — Z888 Allergy status to other drugs, medicaments and biological substances status: Secondary | ICD-10-CM | POA: Insufficient documentation

## 2017-07-04 DIAGNOSIS — M19041 Primary osteoarthritis, right hand: Secondary | ICD-10-CM | POA: Diagnosis not present

## 2017-07-04 DIAGNOSIS — J323 Chronic sphenoidal sinusitis: Secondary | ICD-10-CM | POA: Diagnosis not present

## 2017-07-04 DIAGNOSIS — Z880 Allergy status to penicillin: Secondary | ICD-10-CM | POA: Diagnosis not present

## 2017-07-04 DIAGNOSIS — Z8711 Personal history of peptic ulcer disease: Secondary | ICD-10-CM | POA: Diagnosis not present

## 2017-07-04 DIAGNOSIS — I480 Paroxysmal atrial fibrillation: Secondary | ICD-10-CM | POA: Diagnosis not present

## 2017-07-04 DIAGNOSIS — J449 Chronic obstructive pulmonary disease, unspecified: Secondary | ICD-10-CM | POA: Insufficient documentation

## 2017-07-04 DIAGNOSIS — Z87891 Personal history of nicotine dependence: Secondary | ICD-10-CM | POA: Insufficient documentation

## 2017-07-04 DIAGNOSIS — R05 Cough: Secondary | ICD-10-CM | POA: Insufficient documentation

## 2017-07-04 DIAGNOSIS — Z79899 Other long term (current) drug therapy: Secondary | ICD-10-CM | POA: Insufficient documentation

## 2017-07-04 DIAGNOSIS — K219 Gastro-esophageal reflux disease without esophagitis: Secondary | ICD-10-CM | POA: Diagnosis not present

## 2017-07-04 DIAGNOSIS — Z85118 Personal history of other malignant neoplasm of bronchus and lung: Secondary | ICD-10-CM | POA: Diagnosis not present

## 2017-07-04 DIAGNOSIS — B49 Unspecified mycosis: Secondary | ICD-10-CM | POA: Diagnosis not present

## 2017-07-04 DIAGNOSIS — Z7901 Long term (current) use of anticoagulants: Secondary | ICD-10-CM | POA: Insufficient documentation

## 2017-07-04 DIAGNOSIS — M19042 Primary osteoarthritis, left hand: Secondary | ICD-10-CM | POA: Diagnosis not present

## 2017-07-04 HISTORY — PX: SINUS ENDO WITH FUSION: SHX5329

## 2017-07-04 HISTORY — DX: Dyspnea, unspecified: R06.00

## 2017-07-04 HISTORY — PX: NASAL SINUS SURGERY: SHX719

## 2017-07-04 HISTORY — DX: Malignant neoplasm of unspecified part of unspecified bronchus or lung: C34.90

## 2017-07-04 HISTORY — DX: Chronic sinusitis, unspecified: J32.9

## 2017-07-04 HISTORY — DX: Paroxysmal atrial fibrillation: I48.0

## 2017-07-04 LAB — BASIC METABOLIC PANEL
ANION GAP: 8 (ref 5–15)
BUN: 19 mg/dL (ref 6–20)
CALCIUM: 9 mg/dL (ref 8.9–10.3)
CO2: 27 mmol/L (ref 22–32)
Chloride: 104 mmol/L (ref 101–111)
Creatinine, Ser: 1.11 mg/dL (ref 0.61–1.24)
GFR calc Af Amer: >60 mL/min (ref >60)
GFR, EST NON AFRICAN AMERICAN: 59 mL/min — AB (ref >60)
Glucose, Bld: 105 mg/dL — ABNORMAL HIGH (ref 65–99)
Potassium: 3.8 mmol/L (ref 3.5–5.1)
Sodium: 139 mmol/L (ref 135–145)

## 2017-07-04 LAB — PROTIME-INR
INR: 1.12
PROTHROMBIN TIME: 14.3 s (ref 11.4–15.2)

## 2017-07-04 LAB — CBC
HCT: 36.3 % — ABNORMAL LOW (ref 39.0–52.0)
HEMOGLOBIN: 11.8 g/dL — AB (ref 13.0–17.0)
MCH: 28.3 pg (ref 26.0–34.0)
MCHC: 32.5 g/dL (ref 30.0–36.0)
MCV: 87.1 fL (ref 78.0–100.0)
PLATELETS: 273 10*3/uL (ref 150–400)
RBC: 4.17 MIL/uL — AB (ref 4.22–5.81)
RDW: 13.4 % (ref 11.5–15.5)
WBC: 5.7 10*3/uL (ref 4.0–10.5)

## 2017-07-04 SURGERY — SINUS SURGERY, ENDOSCOPIC
Anesthesia: General | Site: Nose

## 2017-07-04 MED ORDER — DEXAMETHASONE SODIUM PHOSPHATE 10 MG/ML IJ SOLN
INTRAMUSCULAR | Status: AC
  Filled 2017-07-04: qty 1

## 2017-07-04 MED ORDER — OXYMETAZOLINE HCL 0.05 % NA SOLN
NASAL | Status: AC
  Filled 2017-07-04: qty 15

## 2017-07-04 MED ORDER — DEXAMETHASONE SODIUM PHOSPHATE 10 MG/ML IJ SOLN
10.0000 mg | Freq: Once | INTRAMUSCULAR | Status: AC
  Administered 2017-07-04: 10 mg via INTRAVENOUS

## 2017-07-04 MED ORDER — LACTATED RINGERS IV SOLN
INTRAVENOUS | Status: DC | PRN
  Administered 2017-07-04 (×2): via INTRAVENOUS

## 2017-07-04 MED ORDER — OXYMETAZOLINE HCL 0.05 % NA SOLN
1.0000 | Freq: Two times a day (BID) | NASAL | Status: DC
  Administered 2017-07-04: 1 via NASAL

## 2017-07-04 MED ORDER — FENTANYL CITRATE (PF) 250 MCG/5ML IJ SOLN
INTRAMUSCULAR | Status: AC
  Filled 2017-07-04: qty 5

## 2017-07-04 MED ORDER — SODIUM CHLORIDE 0.9 % IR SOLN
Status: DC | PRN
  Administered 2017-07-04 (×2): 1000 mL

## 2017-07-04 MED ORDER — ONDANSETRON HCL 4 MG/2ML IJ SOLN
INTRAMUSCULAR | Status: DC | PRN
  Administered 2017-07-04: 4 mg via INTRAVENOUS

## 2017-07-04 MED ORDER — PHENYLEPHRINE 40 MCG/ML (10ML) SYRINGE FOR IV PUSH (FOR BLOOD PRESSURE SUPPORT)
PREFILLED_SYRINGE | INTRAVENOUS | Status: DC | PRN
  Administered 2017-07-04: 80 ug via INTRAVENOUS
  Administered 2017-07-04 (×3): 160 ug via INTRAVENOUS
  Administered 2017-07-04: 80 ug via INTRAVENOUS
  Administered 2017-07-04: 160 ug via INTRAVENOUS

## 2017-07-04 MED ORDER — FENTANYL CITRATE (PF) 100 MCG/2ML IJ SOLN
INTRAMUSCULAR | Status: DC | PRN
  Administered 2017-07-04: 50 ug via INTRAVENOUS

## 2017-07-04 MED ORDER — MIDAZOLAM HCL 2 MG/2ML IJ SOLN
INTRAMUSCULAR | Status: AC
  Filled 2017-07-04: qty 2

## 2017-07-04 MED ORDER — SUCCINYLCHOLINE CHLORIDE 20 MG/ML IJ SOLN
INTRAMUSCULAR | Status: DC | PRN
  Administered 2017-07-04: 100 mg via INTRAVENOUS

## 2017-07-04 MED ORDER — LIDOCAINE-EPINEPHRINE 1 %-1:100000 IJ SOLN
INTRAMUSCULAR | Status: AC
  Filled 2017-07-04: qty 1

## 2017-07-04 MED ORDER — SUCCINYLCHOLINE CHLORIDE 200 MG/10ML IV SOSY
PREFILLED_SYRINGE | INTRAVENOUS | Status: AC
  Filled 2017-07-04: qty 10

## 2017-07-04 MED ORDER — MUPIROCIN 2 % EX OINT
TOPICAL_OINTMENT | CUTANEOUS | Status: AC
  Filled 2017-07-04: qty 22

## 2017-07-04 MED ORDER — OXYCODONE HCL 5 MG PO TABS: 5.0000 mg | ORAL_TABLET | Freq: Once | ORAL | Status: DC | PRN

## 2017-07-04 MED ORDER — STERILE WATER FOR IRRIGATION IR SOLN
Status: DC | PRN
  Administered 2017-07-04: 500 mL

## 2017-07-04 MED ORDER — LIDOCAINE-EPINEPHRINE 1 %-1:100000 IJ SOLN
INTRAMUSCULAR | Status: DC | PRN
  Administered 2017-07-04: 20 mL

## 2017-07-04 MED ORDER — PHENYLEPHRINE HCL 10 MG/ML IJ SOLN
INTRAVENOUS | Status: DC | PRN
  Administered 2017-07-04: 50 ug/min via INTRAVENOUS

## 2017-07-04 MED ORDER — LIDOCAINE 2% (20 MG/ML) 5 ML SYRINGE
INTRAMUSCULAR | Status: DC | PRN
  Administered 2017-07-04: 50 mg via INTRAVENOUS

## 2017-07-04 MED ORDER — TRIAMCINOLONE ACETONIDE 40 MG/ML IJ SUSP
INTRAMUSCULAR | Status: AC
  Filled 2017-07-04: qty 5

## 2017-07-04 MED ORDER — CHLORHEXIDINE GLUCONATE CLOTH 2 % EX PADS: 6.0000 | MEDICATED_PAD | Freq: Once | CUTANEOUS | Status: DC

## 2017-07-04 MED ORDER — PROPOFOL 10 MG/ML IV BOLUS
INTRAVENOUS | Status: AC
  Filled 2017-07-04: qty 40

## 2017-07-04 MED ORDER — CLINDAMYCIN PHOSPHATE 600 MG/50ML IV SOLN
600.0000 mg | Freq: Once | INTRAVENOUS | Status: AC
  Administered 2017-07-04: 600 mg via INTRAVENOUS
  Filled 2017-07-04: qty 50

## 2017-07-04 MED ORDER — FENTANYL CITRATE (PF) 100 MCG/2ML IJ SOLN: 25.0000 ug | INTRAMUSCULAR | Status: DC | PRN

## 2017-07-04 MED ORDER — LIDOCAINE 2% (20 MG/ML) 5 ML SYRINGE
INTRAMUSCULAR | Status: AC
  Filled 2017-07-04: qty 5

## 2017-07-04 MED ORDER — OXYMETAZOLINE HCL 0.05 % NA SOLN
NASAL | Status: DC | PRN
  Administered 2017-07-04: 1 via TOPICAL

## 2017-07-04 MED ORDER — SODIUM CHLORIDE 0.9 % IR SOLN
Status: DC | PRN
  Administered 2017-07-04: 1000 mL

## 2017-07-04 MED ORDER — SALINE SPRAY 0.65 % NA SOLN
1.0000 | NASAL | Status: DC | PRN
  Administered 2017-07-04: 1 via NASAL
  Filled 2017-07-04: qty 44

## 2017-07-04 MED ORDER — ARTIFICIAL TEARS OPHTHALMIC OINT
TOPICAL_OINTMENT | OPHTHALMIC | Status: DC | PRN
  Administered 2017-07-04: 1 via OPHTHALMIC

## 2017-07-04 MED ORDER — GELATIN ADSORBABLE OP FILM
ORAL_FILM | OPHTHALMIC | Status: AC
  Filled 2017-07-04: qty 1

## 2017-07-04 MED ORDER — ONDANSETRON HCL 4 MG/2ML IJ SOLN: 4.0000 mg | Freq: Once | INTRAMUSCULAR | Status: DC | PRN

## 2017-07-04 MED ORDER — MUPIROCIN CALCIUM 2 % EX CREA
TOPICAL_CREAM | CUTANEOUS | Status: AC
  Filled 2017-07-04: qty 15

## 2017-07-04 MED ORDER — PROPOFOL 10 MG/ML IV BOLUS
INTRAVENOUS | Status: DC | PRN
  Administered 2017-07-04: 120 mg via INTRAVENOUS

## 2017-07-04 MED ORDER — ONDANSETRON HCL 4 MG/2ML IJ SOLN
INTRAMUSCULAR | Status: AC
  Filled 2017-07-04: qty 2

## 2017-07-04 MED ORDER — OXYCODONE HCL 5 MG/5ML PO SOLN: 5.0000 mg | Freq: Once | ORAL | Status: DC | PRN

## 2017-07-04 MED ORDER — PHENYLEPHRINE 40 MCG/ML (10ML) SYRINGE FOR IV PUSH (FOR BLOOD PRESSURE SUPPORT)
PREFILLED_SYRINGE | INTRAVENOUS | Status: AC
  Filled 2017-07-04: qty 20

## 2017-07-04 SURGICAL SUPPLY — 64 items
ATTRACTOMAT 16X20 MAGNETIC DRP (DRAPES) ×2 IMPLANT
BLADE RAD40 ROTATE 4M 4 5PK (BLADE) IMPLANT
BLADE RAD40 ROTATE 4M 4MM 5PK (BLADE)
BLADE RAD60 ROTATE M4 4 5PK (BLADE) IMPLANT
BLADE RAD60 ROTATE M4 4MM 5PK (BLADE)
BLADE ROTATE RAD 40 4 M4 (BLADE) IMPLANT
BLADE ROTATE RAD 40 4MM M4 (BLADE)
BLADE ROTATE TRICUT 4MX13CM M4 (BLADE) ×1
BLADE ROTATE TRICUT 4X13 M4 (BLADE) ×3 IMPLANT
BLADE SURG 15 STRL LF DISP TIS (BLADE) IMPLANT
BLADE SURG 15 STRL SS (BLADE)
BLADE TRICUT ROTATE M4 4 5PK (BLADE) ×3 IMPLANT
BLADE TRICUT ROTATE M4 4MM 5PK (BLADE) ×1
CANISTER SUCT 3000ML PPV (MISCELLANEOUS) ×8 IMPLANT
COAGULATOR SUCT 6 FR SWTCH (ELECTROSURGICAL)
COAGULATOR SUCT 8FR VV (MISCELLANEOUS) IMPLANT
COAGULATOR SUCT SWTCH 10FR 6 (ELECTROSURGICAL) IMPLANT
DECANTER SPIKE VIAL GLASS SM (MISCELLANEOUS) ×2 IMPLANT
DRAPE HALF SHEET 40X57 (DRAPES) IMPLANT
DRESSING NASAL KENNEDY 3.5X.9 (MISCELLANEOUS) IMPLANT
DRSG NASAL KENNEDY 3.5X.9 (MISCELLANEOUS)
ELECT COATED BLADE 2.86 ST (ELECTRODE) IMPLANT
ELECT REM PT RETURN 9FT ADLT (ELECTROSURGICAL) ×4
ELECTRODE REM PT RTRN 9FT ADLT (ELECTROSURGICAL) ×2 IMPLANT
FILTER ARTHROSCOPY CONVERTOR (FILTER) ×4 IMPLANT
FLUID NSS /IRRIG 1000 ML XXX (MISCELLANEOUS) ×4 IMPLANT
GLOVE BIO SURGEON STRL SZ7 (GLOVE) ×2 IMPLANT
GLOVE BIOGEL M 7.0 STRL (GLOVE) ×8 IMPLANT
GLOVE BIOGEL PI IND STRL 7.0 (GLOVE) IMPLANT
GLOVE BIOGEL PI INDICATOR 7.0 (GLOVE) ×4
GOWN STRL REUS W/ TWL LRG LVL3 (GOWN DISPOSABLE) ×4 IMPLANT
GOWN STRL REUS W/TWL LRG LVL3 (GOWN DISPOSABLE) ×8
KIT BASIN OR (CUSTOM PROCEDURE TRAY) ×4 IMPLANT
KIT TURNOVER KIT B (KITS) ×4 IMPLANT
NDL 18GX1X1/2 (RX/OR ONLY) (NEEDLE) IMPLANT
NDL HYPO 25GX1X1/2 BEV (NEEDLE) IMPLANT
NDL SPNL 25GX3.5 QUINCKE BL (NEEDLE) IMPLANT
NEEDLE 18GX1X1/2 (RX/OR ONLY) (NEEDLE) IMPLANT
NEEDLE HYPO 25GX1X1/2 BEV (NEEDLE) ×4 IMPLANT
NEEDLE SPNL 25GX3.5 QUINCKE BL (NEEDLE) ×4 IMPLANT
NS IRRIG 1000ML POUR BTL (IV SOLUTION) ×6 IMPLANT
PAD ARMBOARD 7.5X6 YLW CONV (MISCELLANEOUS) ×8 IMPLANT
PENCIL BUTTON HOLSTER BLD 10FT (ELECTRODE) IMPLANT
SPECIMEN JAR SMALL (MISCELLANEOUS) ×4 IMPLANT
SPONGE NEURO XRAY DETECT 1X3 (DISPOSABLE) ×4 IMPLANT
SUT CHROMIC 5 0 P 3 (SUTURE) IMPLANT
SUT ETHILON 3 0 FSL (SUTURE) IMPLANT
SUT PLAIN 4 0 ~~LOC~~ 1 (SUTURE) IMPLANT
SWAB COLLECTION DEVICE MRSA (MISCELLANEOUS) ×2 IMPLANT
SWAB CULTURE ESWAB REG 1ML (MISCELLANEOUS) ×2 IMPLANT
SYR BULB 3OZ (MISCELLANEOUS) IMPLANT
SYR CONTROL 10ML LL (SYRINGE) ×4 IMPLANT
TOWEL NATURAL 6PK STERILE (DISPOSABLE) ×2 IMPLANT
TOWEL OR 17X24 6PK STRL BLUE (TOWEL DISPOSABLE) ×6 IMPLANT
TRACKER ENT INSTRUMENT (MISCELLANEOUS) ×4 IMPLANT
TRACKER ENT PATIENT (MISCELLANEOUS) ×4 IMPLANT
TRAP SPECIMEN MUCOUS 40CC (MISCELLANEOUS) IMPLANT
TRAY ENT MC OR (CUSTOM PROCEDURE TRAY) ×4 IMPLANT
TUBE CONNECTING 12'X1/4 (SUCTIONS) ×1
TUBE CONNECTING 12X1/4 (SUCTIONS) ×3 IMPLANT
TUBING EXTENTION W/L.L. (IV SETS) ×4 IMPLANT
TUBING STRAIGHTSHOT EPS 5PK (TUBING) ×4 IMPLANT
WATER STERILE IRR 1000ML POUR (IV SOLUTION) ×4 IMPLANT
WIPE INSTRUMENT VISIWIPE 73X73 (MISCELLANEOUS) ×4 IMPLANT

## 2017-07-04 NOTE — Op Note (Signed)
Operative Note:  ENDOSCOPIC SINUS SURGERY WITH NAVIGATION      Patient: [place] record number: [id number]  Date:07/04/2017  Pre-operative Indications: 1.  Chronic Left Sphenoid Sinusitis       Postoperative Indications: Same  Surgical Procedure: 1.  Left Endoscopic Sphenoidotomy with Tissue Removal and Navigation      Anesthesia: GET  Surgeon: Delsa Bern, M.D.  Complications: None  EBL: 50 cc  Findings: Chronic Left Fungal Infection    Brief History: The patient is a 84 y.o. male with a history of chronic sinusitis and turbinate hypertrophy. The patient has been on medical therapy to reduce nasal mucosal edema and infection including antibiotics, saline nasal spray and topical nasal steroids. Despite appropriate medical therapy the patient continues to have ongoing symptoms of chronic cough. Given the patient's history and findings, the above surgical procedures were recommended, risks and benefits were discussed in detail with the patient may understand and agree with our plan for surgery which is scheduled at [facility] under general anesthesia as an outpatient.  Surgical Procedure: The patient is brought to the operating room on 07/04/2017 and placed in supine position on the operating table. General endotracheal anesthesia was established without difficulty. When the patient was adequately anesthetized, surgical timeout was performed with correct identification of the patient and the left surgical procedure. The patient's nose was then injected with  2 cc of 1% lidocaine 1:100,000 dilution epinephrine which was injected in a submucosal fashion. The patient's nose was then packed with Afrin-soaked cottonoid pledgets were left in place for approximately 10 minutes lateral vasoconstriction and hemostasis.  The Xomed Fusion navigation headgear was applied in anatomic and surgical landmarks were identified and confirmed, navigation was used throughout the sinus  component of the surgical procedure.  With the patient prepped draped and prepared for surgery, nasal nasal endoscopy was performed on the left side.  The sphenoid sinus ostium was identified and enlarged inferior and laterally. Using a 0 degree endoscope and a straight microdebrider, the fungal debris was removed.  Culture and sensitivity obtained for bacteria and fungus. Copious saline irrigation was used to completely clear the sinus. Mucosa intact and no bleeding.  Surgical sponge count was correct. An oral gastric tube was passed and the stomach contents were aspirated. Patient was awakened from anesthetic and transferred from the operating room to the recovery room in stable condition. There were no complications and blood loss was 50 cc.   Delsa Bern, M.D. [facility] ENT 07/04/2017

## 2017-07-04 NOTE — H&P (Signed)
Ray[place] is an 84 y.o. male.   Chief Complaint: Chronic cough HPI: Hx of cough and chronic sinusitis  Past Medical History:  Diagnosis Date  . ALLERGIC RHINITIS 01/23/2007  . Arthritis    hands  . BENIGN PROSTATIC HYPERTROPHY 09/10/2006  . Chronic headaches   . Chronic sinusitis   . Complication of anesthesia    difficulty voiding- if cath needed needs to placed with need to be done with scope  . COPD, MILD 04/03/2010   patient denies on preop visit of 08/02/2014   . Difficulty in urination   . Dyspnea   . FATIGUE 01/08/2008  . GERD 04/15/2010  . H. pylori infection    hx of   . Headache(784.0) 09/10/2006  . HOARSENESS 04/15/2010  . HYPERLIPIDEMIA 09/10/2006  . INSOMNIA-SLEEP DISORDER-UNSPEC 04/24/2009  . Lung cancer ([place])   . PAF (paroxysmal atrial fibrillation) ([place])    identified on EKG 04/08/17  . PEPTIC ULCER DISEASE 01/23/2007  . Pneumonia    1955    Past Surgical History:  Procedure Laterality Date  . CATARACT EXTRACTION Left   . COLONOSCOPY    . CYSTOSCOPY N/A 10/07/2016   Procedure: CYSTOSCOPY;  Surgeon: Festus Aloe, MD;  Location: [facility];  Service: Urology;  Laterality: N/A;  . HEMORRHOID BANDING    . INSERTION OF SUPRAPUBIC CATHETER N/A 10/07/2016   Procedure: FOLEY  CATHETER PLACEMENT;  Surgeon: Festus Aloe, MD;  Location: [facility];  Service: Urology;  Laterality: N/A;  . KNEE ARTHROSCOPY Left    torn meniscus  . LUMBAR LAMINECTOMY/DECOMPRESSION MICRODISCECTOMY Right 08/07/2014   Procedure: complete DECOMPRESSION LUMBAR LAMINECTOMY/MICRODISCECTOMY OF L4-L5 for spinal stenosis, DECOMPRESSION OF L3-L4;  Surgeon: Latanya Maudlin, MD;  [facility];  Service: Orthopedics;  Laterality: Right;  . RIGHT/LEFT HEART CATH AND CORONARY ANGIOGRAPHY N/A 06/21/2017   Procedure: RIGHT/LEFT HEART CATH AND CORONARY ANGIOGRAPHY;  Surgeon: Belva Crome, MD;  Location: [facility];  Service: Cardiovascular;  Laterality: N/A;  . ROTATOR CUFF REPAIR Right   . SHOULDER  ARTHROSCOPY     x3, L & R  . VIDEO ASSISTED THORACOSCOPY (VATS)/ LOBECTOMY Right 10/07/2016   Procedure: VIDEO ASSISTED THORACOSCOPY (VATS)/RIGHT MIDDLE LOBECTOMY;  Surgeon: [name], MD;  Location: [facility];  Service: Thoracic;  Laterality: Right;  [name] VIDEO BRONCHOSCOPY WITH ENDOBRONCHIAL NAVIGATION N/A 09/23/2016   Procedure: VIDEO BRONCHOSCOPY WITH ENDOBRONCHIAL NAVIGATION;  Surgeon: [name], MD;  Location: [facility];  Service: Thoracic;  Laterality: N/A;  . VIDEO BRONCHOSCOPY WITH ENDOBRONCHIAL ULTRASOUND N/A 09/23/2016   Procedure: VIDEO BRONCHOSCOPY WITH ENDOBRONCHIAL ULTRASOUND;  Surgeon: [name], MD;  [facility];  Service: Thoracic;  Laterality: N/A;    Family History  Problem Relation Age of Onset  . Prostate cancer Brother   . Alzheimer's disease Mother   . Alzheimer's disease Sister    Social History:  reports that he quit smoking about 41 years ago. He has a 23.00 pack-year smoking history. He has never used smokeless tobacco. He reports that he drinks alcohol. He reports that he does not use drugs.  Allergies:  Allergies  Allergen Reactions  . Alfuzosin Other (See Comments) and Hypertension    BP went crazy, dizzy, passing out   . Aricept [Donepezil Hcl] Other (See Comments)    Insomnia, headache  . Penicillins Hives and Other (See Comments)    A PCN REACTION WITH IMMEDIATE RASH, FACIAL/TONGUE/THROAT SWELLING, SOB, OR LIGHTHEADEDNESS WITH HYPOTENSION:  #  #  #  YES  #  #  #  Has patient had a PCN reaction causing severe rash involving mucus membranes or skin necrosis:No Has patient had a PCN reaction that required hospitalization:No Has patient had a PCN reaction occurring within the last 10 years:No If all of the above answers are "NO", then may proceed with Cephalosporin use.     Medications Prior to Admission  Medication Sig Dispense Refill  . calcium carbonate (TUMS - DOSED IN MG ELEMENTAL CALCIUM) 500 MG chewable tablet Chew 1  tablet by mouth 3 (three) times daily as needed for indigestion or heartburn.     . diclofenac sodium (VOLTAREN) 1 % GEL Apply 1 application topically at bedtime.    . finasteride (PROSCAR) 5 MG tablet Take 5 mg by mouth at bedtime.     . fluticasone (FLONASE) 50 MCG/ACT nasal spray Use 2 sprays in each nostril daily as needed for allergies  11  . Melatonin 10 MG CAPS Take 10 mg by mouth at bedtime.    . silodosin (RAPAFLO) 8 MG CAPS capsule Take 8 mg by mouth at bedtime.     . traMADol (ULTRAM) 50 MG tablet Take 50 mg by mouth every 6 (six) hours as needed for moderate pain.    [name] apixaban (ELIQUIS) 5 MG TABS tablet Take 1 tablet (5 mg total) by mouth 2 (two) times daily. 180 tablet 3  . dextromethorphan (DELSYM) 30 MG/5ML liquid Take 5 mLs (30 mg total) by mouth 2 (two) times daily. (Patient not taking: Reported on 06/15/2017) 89 mL 5    Results for orders placed or performed during the hospital encounter of 07/04/17 (from the past 48 hour(s))  Basic metabolic panel     Status: Abnormal   Collection Time: 07/04/17  6:37 AM  Result Value Ref Range   Sodium 139 135 - 145 mmol/L   Potassium 3.8 3.5 - 5.1 mmol/L   Chloride 104 101 - 111 mmol/L   CO2 27 22 - 32 mmol/L   Glucose, Bld 105 (H) 65 - 99 mg/dL   BUN 19 6 - 20 mg/dL   Creatinine, Ser 1.11 0.61 - 1.24 mg/dL   Calcium 9.0 8.9 - 10.3 mg/dL   GFR calc non Af Amer 59 (L) >60 mL/min   GFR calc Af Amer >60 >60 mL/min    Comment: (NOTE) The eGFR has been calculated using the CKD EPI equation. This calculation has not been validated in all clinical situations. eGFR's persistently <60 mL/min signify possible Chronic Kidney Disease.    Anion gap 8 5 - 15    Comment: Performed at [facility] [address] [place], [place] [address]  CBC     Status: Abnormal   Collection Time: 07/04/17  6:37 AM  Result Value Ref Range   WBC 5.7 4.0 - 10.5 K/uL   RBC 4.17 (L) 4.22 - 5.81 MIL/uL   Hemoglobin 11.8 (L) 13.0 - 17.0 g/dL   HCT 36.3  (L) 39.0 - 52.0 %   MCV 87.1 78.0 - 100.0 fL   MCH 28.3 26.0 - 34.0 pg   MCHC 32.5 30.0 - 36.0 g/dL   RDW 13.4 11.5 - 15.5 %   Platelets 273 150 - 400 K/uL    Comment: Performed at [facility], [place] [address] [place], [place] [address]  PT- INR Day of Surgery     Status: None   Collection Time: 07/04/17  6:37 AM  Result Value Ref Range   Prothrombin Time 14.3 11.4 - 15.2 seconds   INR 1.12  Comment: Performed at [facility], [place] [address], [place], [place] [address]   No results found.  Review of Systems  Constitutional: Negative.   HENT: Negative.   Respiratory: Positive for cough.     Blood pressure (!) 101/59, pulse (!) 102, temperature 98.4 F (36.9 C), temperature source Oral, resp. rate 20, height _0  (1.727 m), weight 66.7 kg (147 lb), SpO2 98 %. Physical Exam  Constitutional: He appears well-developed.  Neck: Normal range of motion. Neck supple.  Cardiovascular: Normal rate.     Assessment/Plan Adm for ESS for chronic cough  Arielle Eber, MD 07/04/2017, 7:24 AM

## 2017-07-04 NOTE — Anesthesia Postprocedure Evaluation (Signed)
Anesthesia Post Note  Patient: Raymond Logan  Procedure(s) Performed: LEFT ENDOSCOPIC SINUS SURGERY (Left Nose) SINUS ENDO WITH FUSION (N/A Nose)     Patient location during evaluation: PACU Anesthesia Type: General Level of consciousness: awake and alert Pain management: pain level controlled Vital Signs Assessment: post-procedure vital signs reviewed and stable Respiratory status: spontaneous breathing, nonlabored ventilation and respiratory function stable Cardiovascular status: blood pressure returned to baseline and stable Postop Assessment: no apparent nausea or vomiting Anesthetic complications: no    Last Vitals:  Vitals:   07/04/17 1006 07/04/17 1015  BP: 102/67   Pulse: 71 93  Resp: 19   Temp:  36.6 C  SpO2: 94% 95%    Last Pain:  Vitals:   07/04/17 0906  TempSrc:   PainSc: 0-No pain                 Audry Pili

## 2017-07-04 NOTE — Transfer of Care (Signed)
Immediate Anesthesia Transfer of Care Note  Patient: Raymond Logan  Procedure(s) Performed: LEFT ENDOSCOPIC SINUS SURGERY (Left Nose) SINUS ENDO WITH FUSION (N/A Nose)  Patient Location: PACU  Anesthesia Type:General  Level of Consciousness: patient cooperative and responds to stimulation  Airway & Oxygen Therapy: Patient Spontanous Breathing and Patient connected to face mask oxygen  Post-op Assessment: Report given to RN and Post -op Vital signs reviewed and stable  Post vital signs: Reviewed and stable  Last Vitals:  Vitals Value Taken Time  BP 109/65 07/04/2017  9:07 AM  Temp    Pulse 81 07/04/2017  9:12 AM  Resp 15 07/04/2017  9:12 AM  SpO2 100 % 07/04/2017  9:12 AM  Vitals shown include unvalidated device data.  Last Pain:  Vitals:   07/04/17 0641  TempSrc:   PainSc: 0-No pain         Complications: No apparent anesthesia complications

## 2017-07-05 ENCOUNTER — Encounter (HOSPITAL_COMMUNITY): Payer: Self-pay | Admitting: Otolaryngology

## 2017-07-09 LAB — AEROBIC/ANAEROBIC CULTURE (SURGICAL/DEEP WOUND)

## 2017-07-09 LAB — AEROBIC/ANAEROBIC CULTURE W GRAM STAIN (SURGICAL/DEEP WOUND): Culture: NORMAL

## 2017-07-11 ENCOUNTER — Other Ambulatory Visit: Payer: Medicare Other | Admitting: *Deleted

## 2017-07-11 ENCOUNTER — Other Ambulatory Visit: Payer: Medicare Other | Admitting: Licensed Clinical Social Worker

## 2017-07-11 ENCOUNTER — Encounter: Payer: Self-pay | Admitting: *Deleted

## 2017-07-11 DIAGNOSIS — Z515 Encounter for palliative care: Secondary | ICD-10-CM

## 2017-07-11 NOTE — Progress Notes (Signed)
COMMUNITY PALLIATIVE CARE RN NOTE  PATIENT NAME: Raymond Logan DOB: Jul 21, 1930 MRN: [id number]  PRIMARY CARE PROVIDER: Biagio Borg, MD  RESPONSIBLE PARTY:  Acct ID - Guarantor Home Phone Work Phone Relationship Acct Type  [id number] [name]  Self P/F     [address], [place], [place] [address]   PLAN OF CARE and INTERVENTION:  1. ADVANCE CARE PLANNING/GOALS OF CARE: Avoid hospitalizations and remain at home if possible 2. PATIENT/CAREGIVER EDUCATION: Reinforced importance of Energy Conservation and Safety/Fall Precautions 3. DISEASE STATUS: Joint visit made with [place]. Patient answered the door. Continues to report feelings of dyspnea with ambulation and requiring frequent rest periods. Showers remain one of patient's most tiring activities. Recommended again utilizing shower chair during showers to conserve energy. Patient continues to drive and attend his table tennis games at the [facility] every Wednesday. Able to play about 2-3 games but then has to sit down to rest. Usually unable to play any additional games after this. Left Endoscopic Sinus Surgery performed on 07/04/17 successfully. Patient states cough has decreased as a result of this surgery. Nonproductive cough noted during visit that mainly seems to occur after exertion or prolonged conversation. Remains unable to go grocery shopping d/t ongoing dyspnea and fatigue. Wife used to go grocery shopping, however had 3 recent falls and now ambulates with a cane. Wife discussed that daughter wants both of them to move closer to her in [place] area to be closer. Medications reviewed. Patient denies pain during visit, but does report mild pain with ambulation in L knee. Has not required use of Tramadol over past few months. Continues with headaches at times, but denies today. Intake unchanged since visit last month. Small frequent meals along with nutritional supplements.    HISTORY OF PRESENT  ILLNESS: This is a 84 yo male who continues to be followed by Palliative Care Services. Follow up visit made to assess overall condition and address any symptom management needs. Next visit in 1 month.   CODE STATUS: FULL CODE   ADVANCED DIRECTIVES: Y (Living Will and HCPOA) MOST FORM: yes PPS: 60%   PHYSICAL EXAM:   VITALS: Today's Vitals   07/11/17 1351  BP: 108/70  Pulse: (!) 57  Resp: 20  SpO2: 97%  PainSc: 0-No pain    LUNGS: clear to auscultation , decreased breath sounds CARDIAC: HR irregular EXTREMITIES: No edema SKIN: Skin color, texture, turgor normal. No rashes or lesions  NEURO: Alert and oriented x3, pleasant and engaging   (Duration of visit and documentation 90 minutes)    Daryl Eastern, RN, BSN

## 2017-07-11 NOTE — Progress Notes (Signed)
COMMUNITY PALLIATIVE CARE SW NOTE  PATIENT NAME: Raymond Logan DOB: 1930-09-13 MRN: [id number]  PRIMARY CARE PROVIDER: Biagio Borg, MD  RESPONSIBLE PARTY:  Acct ID - Guarantor Home Phone Work Phone Relationship Acct Type  [id number] [name]  Self P/F     [address], [place] [address]     PLAN OF CARE and INTERVENTIONS:             1. GOALS OF CARE/ ADVANCE CARE PLANNING:  Patient stated he wished to remain at home with no further surgeries. 2. SOCIAL/EMOTIONAL/SPIRITUAL ASSESSMENT/ INTERVENTIONS:  SW and Palliative Care RN, Daryl Eastern, met with patient and his wife, [name], in their home.  Patient's chief complaint was difficulty breathing.  He expressed wishing it would improve.  SW provided active listening and supportive counseling to both patient and his wife during visit.  Patient appeared to respond positively to life review and displayed a bright affect.  Patient said he plays table tennis and uses his participation as a gauge to determine his physical wellbeing.  3. PATIENT/CAREGIVER EDUCATION/ COPING:  Provided education regarding roles.  Patient copes by expressing his feelings openly.  Patient's wife, [name], had increased difficulty focusing on the conversation today. 4. PERSONAL EMERGENCY PLAN:  Patient takes breaks when he becomes short of breath. 5. COMMUNITY RESOURCES COORDINATION/ HEALTH CARE NAVIGATION:  None per patient. 6. FINANCIAL/LEGAL CONCERNS/INTERVENTIONS:  None per patient.     SOCIAL HX:  Social History   Tobacco Use  . Smoking status: Former Smoker    Packs/day: 1.00    Years: 23.00    Pack years: 23.00    Last attempt to quit: 1978    Years since quitting: 41.3  . Smokeless tobacco: Never Used  Substance Use Topics  . Alcohol use: Yes    Comment: rare    CODE STATUS:  Full Code.  ADVANCED DIRECTIVES: Living Will and HCPOA. MOST FORM COMPLETE:  No HOSPICE EDUCATION PROVIDED: Not during current visit.  PPS:   Patient can ambulate independently.  Intake is normal. Duration of visit and documentation:  100 minutes.      Creola Corn Chenoa Luddy, LCSW

## 2017-07-21 ENCOUNTER — Encounter: Payer: Self-pay | Admitting: Internal Medicine

## 2017-07-21 ENCOUNTER — Ambulatory Visit (INDEPENDENT_AMBULATORY_CARE_PROVIDER_SITE_OTHER): Payer: Medicare Other | Admitting: Internal Medicine

## 2017-07-21 VITALS — BP 110/68 | HR 82 | Ht 68.0 in | Wt 147.0 lb

## 2017-07-21 DIAGNOSIS — I480 Paroxysmal atrial fibrillation: Secondary | ICD-10-CM | POA: Diagnosis not present

## 2017-07-21 DIAGNOSIS — D649 Anemia, unspecified: Secondary | ICD-10-CM

## 2017-07-21 DIAGNOSIS — R0609 Other forms of dyspnea: Secondary | ICD-10-CM

## 2017-07-21 MED ORDER — FUROSEMIDE 20 MG PO TABS: 20.0000 mg | ORAL_TABLET | Freq: Every day | ORAL | 3 refills | Status: DC

## 2017-07-21 MED ORDER — PROPAFENONE HCL 225 MG PO TABS: 225.0000 mg | ORAL_TABLET | Freq: Two times a day (BID) | ORAL | 11 refills | Status: DC

## 2017-07-21 NOTE — Patient Instructions (Addendum)
Medication Instructions:  Your physician has recommended you make the following change in your medication:  1.  Start taking lasix (furosemide) 20 mg one tablet by mouth daily in the morning. 2.  Start taking propafenone 225 mg one tablet by mouth twice a day.  Labwork: You will come back to [facility] office in 1.5 weeks for a BMP on the same day as your EKG.  Testing/Procedures: You will come back to [facility] office in 1.5 weeks for a 12 lead EKG. Please schedule for lab and EKG.  Follow-Up: Further recommendations will be based on results of your blood work and EKG.   Any Other Special Instructions Will Be Listed Below (If Applicable).  If you need a refill on your cardiac medications before your next appointment, please call your pharmacy.   Propafenone tablets What is this medicine? PROPAFENONE (proe pa FEEN one) is an antiarrhythmic agent. It is used to treat irregular heart rhythm and can slow rapid heartbeats. This medicine can help your heart to return to and maintain a normal rhythm. This medicine may be used for other purposes; ask your health care provider or pharmacist if you have questions. COMMON BRAND NAME(S): Rythmol What should I tell my health care provider before I take this medicine? They need to know if you have any of these conditions: -heart disease -high blood levels of potassium -kidney disease -liver disease -low blood pressure -lung disease like asthma, chronic bronchitis or emphysema -pacemaker -slow heart rate -an unusual or allergic reaction to propafenone, other medicines, foods, dyes, or preservatives -pregnant or trying to get pregnant -breast-feeding How should I use this medicine? Take this medicine by mouth with a glass of water. Follow the directions on the prescription label. You can take this medicine with or without food. Take your doses at regular intervals. Do not take your medicine more often than directed. Do not stop taking except  on the advice of your doctor or health care professional. Talk to your pediatrician regarding the use of this medicine in children. Special care may be needed. Overdosage: If you think you have taken too much of this medicine contact a poison control center or emergency room at once. NOTE: This medicine is only for you. Do not share this medicine with others. What if I miss a dose? If you miss a dose, take it as soon as you can. If it is almost time for your next dose, take only that dose. Do not take double or extra doses. What may interact with this medicine? Do not take this medicine with any of the following medications: -arsenic trioxide -certain antibiotics like clarithromycin, erythromycin, grepafloxacin, pentamidine, sparfloxacin, troleandomycin -certain medicines for depression or mental illness like amoxapine, haloperidol, maprotiline, pimozide, sertindole, thioridazine, tricyclic antidepressants, ziprasidone -certain medicines for fungal infections like fluconazole, itraconazole, ketoconazole, posaconazole, voriconazole -certain medicines for irregular heart beat like dofetilide, dronedarone -certain medicines for malaria like chloroquine, halofantrine -cisapride -droperidol -levomethadyl -ranolazine -ritonavir This medicine may also interact with the following medications: -certain medicines for angina or blood pressure -certain medicines for asthma or breathing difficulties like formoterol, salmeterol -certain medicines that treat or prevent blood clots like warfarin -cimetidine -cyclosporine -digoxin -diuretics -local anesthetics -other medicines that prolong the QT interval (cause an abnormal heart rhythm) -rifampin -theophylline This list may not describe all possible interactions. Give your health care provider a list of all the medicines, herbs, non-prescription drugs, or dietary supplements you use. Also tell them if you smoke, drink alcohol, or use illegal  drugs.  Some items may interact with your medicine. What should I watch for while using this medicine? Your condition will be monitored closely when you first begin therapy. Often, this drug is first started in a hospital or other monitored health care setting. Once you are on maintenance therapy, visit your doctor or health care professional for regular checks on your progress. Because your condition and use of this medicine carry some risk, it is a good idea to carry an identification card, necklace or bracelet with details of your condition, medications, and doctor or health care professional. Dennis Bast may get drowsy or dizzy. Do not drive, use machinery, or do anything that needs mental alertness until you know how this medicine affects you. Do not stand or sit up quickly, especially if you are an older patient. This reduces the risk of dizzy or fainting spells. If you are going to have surgery, tell your doctor or health care professional that you are taking this medicine. What side effects may I notice from receiving this medicine? Side effects that you should report to your doctor or health care professional as soon as possible: -chest pain, palpitations -fever or chills -shortness of breath -swelling of feet or legs -trembling or shaking Side effects that usually do not require medical attention (report to your doctor or health care professional if they continue or are bothersome): -blurred vision -changes in taste (a metallic or bitter taste) -constipation or diarrhea -dry mouth -headache -nausea or vomiting -tiredness or weakness This list may not describe all possible side effects. Call your doctor for medical advice about side effects. You may report side effects to FDA at 1-800-FDA-1088. Where should I keep my medicine? Keep out of the reach of children. Store at room temperature between 15 and 30 degrees C (59 and 86 degrees F). Protect from light. Keep container tightly closed. Throw away  any unused medicine after the expiration date. NOTE: This sheet is a summary. It may not cover all possible information. If you have questions about this medicine, talk to your doctor, pharmacist, or health care provider.  2018 Elsevier/Gold Standard (2012-09-11 13:27:06)

## 2017-07-21 NOTE — Progress Notes (Signed)
HPI Raymond Logan returns today for followup the results of his stress test in the setting of unexplained exertional dyspnea. His symptoms have not changed. He does not have angina and I suspected his sob was his anginal equivalent. He previously was quite healthy. He had a markedly abnormal stress test and underwent cardiac cath which revealed no CAD, low normal LV function, normal right and left heart pressures. He remains dyspneic. He has lung CA, s/p right thorascopic middle lobectomy with radiation therapy. He has know atrial fib although he is in NSR today. He does not have palpitations.  Allergies  Allergen Reactions  . Alfuzosin Other (See Comments) and Hypertension    BP went crazy, dizzy, passing out   . Aricept [Donepezil Hcl] Other (See Comments)    Insomnia, headache  . Penicillins Hives and Other (See Comments)    A PCN REACTION WITH IMMEDIATE RASH, FACIAL/TONGUE/THROAT SWELLING, SOB, OR LIGHTHEADEDNESS WITH HYPOTENSION:  #  #  #  YES  #  #  #   Has patient had a PCN reaction causing severe rash involving mucus membranes or skin necrosis:No Has patient had a PCN reaction that required hospitalization:No Has patient had a PCN reaction occurring within the last 10 years:No If all of the above answers are "NO", then may proceed with Cephalosporin use.      Current Outpatient Medications  Medication Sig Dispense Refill  . apixaban (ELIQUIS) 5 MG TABS tablet Take 1 tablet (5 mg total) by mouth 2 (two) times daily. 180 tablet 3  . calcium carbonate (TUMS - DOSED IN MG ELEMENTAL CALCIUM) 500 MG chewable tablet Chew 1 tablet by mouth 3 (three) times daily as needed for indigestion or heartburn.     . diclofenac sodium (VOLTAREN) 1 % GEL Apply 1 application topically at bedtime.    . finasteride (PROSCAR) 5 MG tablet Take 5 mg by mouth at bedtime.     . fluticasone (FLONASE) 50 MCG/ACT nasal spray Use 2 sprays in each nostril daily as needed for allergies  11  . Melatonin 10  MG CAPS Take 10 mg by mouth at bedtime.    . silodosin (RAPAFLO) 8 MG CAPS capsule Take 8 mg by mouth at bedtime.     . traMADol (ULTRAM) 50 MG tablet Take 50 mg by mouth every 6 (six) hours as needed for moderate pain.     No current facility-administered medications for this visit.      Past Medical History:  Diagnosis Date  . ALLERGIC RHINITIS 01/23/2007  . Arthritis    hands  . BENIGN PROSTATIC HYPERTROPHY 09/10/2006  . Chronic headaches   . Chronic sinusitis   . Complication of anesthesia    difficulty voiding- if cath needed needs to placed with need to be done with scope  . COPD, MILD 04/03/2010   patient denies on preop visit of 08/02/2014   . Difficulty in urination   . Dyspnea   . FATIGUE 01/08/2008  . GERD 04/15/2010  . H. pylori infection    hx of   . Headache(784.0) 09/10/2006  . HOARSENESS 04/15/2010  . HYPERLIPIDEMIA 09/10/2006  . INSOMNIA-SLEEP DISORDER-UNSPEC 04/24/2009  . Lung cancer ([place])   . PAF (paroxysmal atrial fibrillation) ([place])    identified on EKG 04/08/17  . PEPTIC ULCER DISEASE 01/23/2007  . Pneumonia    1955    ROS:   All systems reviewed and negative except as noted in the HPI.   Past Surgical History:  Procedure Laterality  Date  . CATARACT EXTRACTION Left   . COLONOSCOPY    . CYSTOSCOPY N/A 10/07/2016   Procedure: CYSTOSCOPY;  Surgeon: Festus Aloe, MD;  Location: [facility];  Service: Urology;  Laterality: N/A;  . HEMORRHOID BANDING    . INSERTION OF SUPRAPUBIC CATHETER N/A 10/07/2016   Procedure: FOLEY  CATHETER PLACEMENT;  Surgeon: Festus Aloe, MD;  Location: [facility];  Service: Urology;  Laterality: N/A;  . KNEE ARTHROSCOPY Left    torn meniscus  . LUMBAR LAMINECTOMY/DECOMPRESSION MICRODISCECTOMY Right 08/07/2014   Procedure: complete DECOMPRESSION LUMBAR LAMINECTOMY/MICRODISCECTOMY OF L4-L5 for spinal stenosis, DECOMPRESSION OF L3-L4;  Surgeon: Latanya Maudlin, MD;  Location: [facility];  Service: Orthopedics;  Laterality: Right;  . NASAL SINUS  SURGERY Left 07/04/2017   Procedure: LEFT ENDOSCOPIC SINUS SURGERY;  Surgeon: Jerrell Belfast, MD;  Location: [place] [facility];  Service: ENT;  Laterality: Left;  . RIGHT/LEFT HEART CATH AND CORONARY ANGIOGRAPHY N/A 06/21/2017   Procedure: RIGHT/LEFT HEART CATH AND CORONARY ANGIOGRAPHY;  Surgeon: Belva Crome, MD;  Location: [facility];  Service: Cardiovascular;  Laterality: N/A;  . ROTATOR CUFF REPAIR Right   . SHOULDER ARTHROSCOPY     x3, L & R  . SINUS ENDO WITH FUSION N/A 07/04/2017   Procedure: SINUS ENDO WITH FUSION;  Surgeon: Jerrell Belfast, MD;  Location: [facility];  Service: ENT;  Laterality: N/A;  . VIDEO ASSISTED THORACOSCOPY (VATS)/ LOBECTOMY Right 10/07/2016   Procedure: VIDEO ASSISTED THORACOSCOPY (VATS)/RIGHT MIDDLE LOBECTOMY;  Surgeon: [name], MD;  Location: [facility];  Service: Thoracic;  Laterality: Right;  [name] VIDEO BRONCHOSCOPY WITH ENDOBRONCHIAL NAVIGATION N/A 09/23/2016   Procedure: VIDEO BRONCHOSCOPY WITH ENDOBRONCHIAL NAVIGATION;  Surgeon: [name], MD;  Location: [facility];  Service: Thoracic;  Laterality: N/A;  . VIDEO BRONCHOSCOPY WITH ENDOBRONCHIAL ULTRASOUND N/A 09/23/2016   Procedure: VIDEO BRONCHOSCOPY WITH ENDOBRONCHIAL ULTRASOUND;  Surgeon: [name], MD;  Location: [facility];  Service: Thoracic;  Laterality: N/A;     Family History  Problem Relation Age of Onset  . Prostate cancer Brother   . Alzheimer's disease Mother   . Alzheimer's disease Sister      Social History   Socioeconomic History  . Marital status: Married    Spouse name: Not on file  . Number of children: 3  . Years of education: Not on file  . Highest education level: Not on file  Occupational History  . Occupation: Retired    Fish farm manager: RETIRED  Social Needs  . Financial resource strain: Not on file  . Food insecurity:    Worry: Not on file    Inability: Not on file  . Transportation needs:    Medical: Not on file    Non-medical: Not on file  Tobacco Use  .  Smoking status: Former Smoker    Packs/day: 1.00    Years: 23.00    Pack years: 23.00    Last attempt to quit: 1978    Years since quitting: 41.4  . Smokeless tobacco: Never Used  Substance and Sexual Activity  . Alcohol use: Yes    Comment: rare  . Drug use: No  . Sexual activity: Not on file  Lifestyle  . Physical activity:    Days per week: Not on file    Minutes per session: Not on file  . Stress: Not on file  Relationships  . Social connections:    Talks on phone: Not on file    Gets together: Not on file    Attends  religious service: Not on file    Active member of club or organization: Not on file    Attends meetings of clubs or organizations: Not on file    Relationship status: Not on file  . Intimate partner violence:    Fear of current or ex partner: Not on file    Emotionally abused: Not on file    Physically abused: Not on file    Forced sexual activity: Not on file  Other Topics Concern  . Not on file  Social History Narrative  . Not on file     BP 110/68   Pulse 82   Ht 5\' 8"  (1.727 m)   Wt 147 lb (66.7 kg)   SpO2 96%   BMI 22.35 kg/m   Physical Exam:  Well appearing elderly man, NAD HEENT: Unremarkable Neck:  6 cm JVD, no thyromegally Lymphatics:  No adenopathy Back:  No CVA tenderness Lungs:  Clear with no wheezes HEART:  Regular rate rhythm, no murmurs, no rubs, no clicks Abd:  soft, positive bowel sounds, no organomegally, no rebound, no guarding Ext:  2 plus pulses, no edema, no cyanosis, no clubbing Skin:  No rashes no nodules Neuro:  CN II through XII intact, motor grossly intact  EKG - NSR with PVC's    Assess/Plan: 1. Dyspnea - after extensive work up, I suspect his dyspnea is multifactorial. Despite having normal left and right heart pressures at cath, he may well have some diastolic heart failure which is manifested by elevated pressures when he exerts himself. I have encouraged him to try some low dose lasix to see if his  dyspnea improves. 2. PAF - while he does not feel palpitations, I wonder if his PAF is manifesting as dyspnea. I have prescribed low dose rhythmol.  3. Lung CA - he is pending repeat CT of chest. He had a postop pleural effusion. His DLCO was 69 4. Abnormal stress test - despite his very abnormal stress test, he does not have CAD! I have encouraged the patient to remain as active as possible.   Lessie Manigo,M.D.  Mikle Bosworth.D.

## 2017-07-25 LAB — CULTURE, FUNGUS WITHOUT SMEAR

## 2017-07-28 ENCOUNTER — Encounter: Payer: Self-pay | Admitting: Internal Medicine

## 2017-08-02 ENCOUNTER — Ambulatory Visit (INDEPENDENT_AMBULATORY_CARE_PROVIDER_SITE_OTHER): Payer: Medicare Other

## 2017-08-02 ENCOUNTER — Other Ambulatory Visit: Payer: Medicare Other | Admitting: *Deleted

## 2017-08-02 VITALS — BP 98/68 | HR 89 | Resp 14 | Ht 68.0 in | Wt 148.8 lb

## 2017-08-02 DIAGNOSIS — R0609 Other forms of dyspnea: Secondary | ICD-10-CM

## 2017-08-02 DIAGNOSIS — I480 Paroxysmal atrial fibrillation: Secondary | ICD-10-CM

## 2017-08-02 DIAGNOSIS — D649 Anemia, unspecified: Secondary | ICD-10-CM

## 2017-08-02 NOTE — Progress Notes (Signed)
Pt presents today for a follow up EKG after starting propafenone. Pt has no complaints today and states his breathing feels better since beginning lasix. EKG was presented to Dr Lovena Le who signed off on it as NSR. Pt will complete labs today and the office will follow up with him based off lab results. Pt had no additional questions.

## 2017-08-02 NOTE — Patient Instructions (Signed)
Medication Instructions:  Your physician recommends that you continue on your current medications as directed. Please refer to the Current Medication list given to you today.   Labwork: You will have labs drawn today: BMP   Testing/Procedures: None ordered.  Follow-Up: Your physician recommends that you schedule a follow-up appointment based on your lab work.   Any Other Special Instructions Will Be Listed Below (If Applicable).     If you need a refill on your cardiac medications before your next appointment, please call your pharmacy.

## 2017-08-03 LAB — BASIC METABOLIC PANEL
BUN / CREAT RATIO: 13 (ref 10–24)
BUN: 15 mg/dL (ref 8–27)
CO2: 26 mmol/L (ref 20–29)
CREATININE: 1.17 mg/dL (ref 0.76–1.27)
Calcium: 9.4 mg/dL (ref 8.6–10.2)
Chloride: 98 mmol/L (ref 96–106)
GFR, EST AFRICAN AMERICAN: 66 mL/min/{1.73_m2} (ref >59)
GFR, EST NON AFRICAN AMERICAN: 57 mL/min/{1.73_m2} — AB (ref >59)
Glucose: 83 mg/dL (ref 65–99)
Potassium: 4.1 mmol/L (ref 3.5–5.2)
Sodium: 139 mmol/L (ref 134–144)

## 2017-08-08 ENCOUNTER — Other Ambulatory Visit: Payer: Medicare Other | Admitting: Licensed Clinical Social Worker

## 2017-08-08 ENCOUNTER — Other Ambulatory Visit: Payer: Medicare Other | Admitting: *Deleted

## 2017-08-11 ENCOUNTER — Telehealth: Payer: Self-pay | Admitting: Internal Medicine

## 2017-08-11 NOTE — Telephone Encounter (Signed)
Copied from [place] [id number]. Topic: Quick Communication - Rx Refill/Question >> Aug 11, 2017  4:02 PM Neva Seat wrote: furosemide (LASIX) 20 MG tablet  Needing refills  [facility] [place], [place] AT [address] [place] [address] Phone: [phone] Fax: [phone]

## 2017-08-11 NOTE — Telephone Encounter (Signed)
Returned call to patient regarding cancelling upcoming appointments.  He did not want to reschedule at this time.  He said he would call bacl. Per 6/12 phone message/ IB message sent to Dr Julien Nordmann regarding cancelled appointment

## 2017-08-12 ENCOUNTER — Inpatient Hospital Stay: Payer: Medicare Other

## 2017-08-12 NOTE — Telephone Encounter (Signed)
Spoke with pt's wife [name] regarding refill of Lasix. Medication was filled by cardiologist on 07/21/17.Refills of medication are already available at requested pharmacy. Understanding verbalized.

## 2017-08-15 DIAGNOSIS — J323 Chronic sphenoidal sinusitis: Secondary | ICD-10-CM | POA: Diagnosis not present

## 2017-08-18 ENCOUNTER — Telehealth: Payer: Self-pay | Admitting: *Deleted

## 2017-08-18 ENCOUNTER — Telehealth: Payer: Self-pay

## 2017-08-18 ENCOUNTER — Ambulatory Visit: Payer: Medicare Other | Admitting: Internal Medicine

## 2017-08-18 NOTE — Telephone Encounter (Signed)
Call from Scheduling, Pt at Desk with Raymond Logan for an appt with MD today. Per 6/13 notes, pt's wife cancelled todays appt, tonya with scheduling called pt back to r/s. Pt advised he would call back to r/s. S/w Dr. Julien Nordmann who advised pt will need to r/s MD appt. Message to scheduling.

## 2017-08-18 NOTE — Telephone Encounter (Signed)
Patient wife called and canceled appointment. Levada Dy) Called RN Karen Chafe.) for instruction on how to assist patient. Spoke with MM and he said to reschedule patient because him and his wife canceled the appointment. Per 6/20 walk ins

## 2017-08-19 ENCOUNTER — Ambulatory Visit (INDEPENDENT_AMBULATORY_CARE_PROVIDER_SITE_OTHER)
Admission: RE | Admit: 2017-08-19 | Discharge: 2017-08-19 | Disposition: A | Discharge status: Home / Self Care | Payer: Medicare Other | Source: Ambulatory Visit | Attending: Internal Medicine | Admitting: Internal Medicine

## 2017-08-19 ENCOUNTER — Encounter: Payer: Self-pay | Admitting: Internal Medicine

## 2017-08-19 ENCOUNTER — Ambulatory Visit: Payer: Medicare Other | Admitting: Internal Medicine

## 2017-08-19 ENCOUNTER — Ambulatory Visit (INDEPENDENT_AMBULATORY_CARE_PROVIDER_SITE_OTHER): Payer: Medicare Other | Admitting: Internal Medicine

## 2017-08-19 VITALS — BP 106/68 | HR 89 | Temp 97.6°F | Ht 68.0 in | Wt 149.0 lb

## 2017-08-19 DIAGNOSIS — J449 Chronic obstructive pulmonary disease, unspecified: Secondary | ICD-10-CM | POA: Diagnosis not present

## 2017-08-19 DIAGNOSIS — R05 Cough: Secondary | ICD-10-CM | POA: Diagnosis not present

## 2017-08-19 DIAGNOSIS — R739 Hyperglycemia, unspecified: Secondary | ICD-10-CM

## 2017-08-19 DIAGNOSIS — R0602 Shortness of breath: Secondary | ICD-10-CM | POA: Diagnosis not present

## 2017-08-19 DIAGNOSIS — R059 Cough, unspecified: Secondary | ICD-10-CM

## 2017-08-19 LAB — POCT GLYCOSYLATED HEMOGLOBIN (HGB A1C): Hemoglobin A1C: 5.2 % (ref 4.0–5.6)

## 2017-08-19 MED ORDER — ALBUTEROL SULFATE HFA 108 (90 BASE) MCG/ACT IN AERS: 2.0000 | INHALATION_SPRAY | Freq: Four times a day (QID) | RESPIRATORY_TRACT | 11 refills | Status: DC | PRN

## 2017-08-19 MED ORDER — BENZONATATE 100 MG PO CAPS: 100.0000 mg | ORAL_CAPSULE | Freq: Two times a day (BID) | ORAL | 2 refills | Status: DC | PRN

## 2017-08-19 NOTE — Assessment & Plan Note (Signed)
a1c ok, cont to work on consistent carb diet

## 2017-08-19 NOTE — Progress Notes (Addendum)
Subjective:    Patient ID: Raymond Logan, male    DOB: 1931-02-02, 84 y.o.   MRN: [id number]  HPI  Here to f/u; overall doing ok,  Pt denies chest pain, increasing sob or doe, wheezing, orthopnea, PND, increased LE swelling, palpitations, dizziness or syncope, but does have persistent dry cough for several months, last CTA chest feb 2019 with pulm fibrosis. No fever,   Pt denies new neurological symptoms such as new headache, or facial or extremity weakness or numbness.  Pt denies polydipsia, polyuria, or low sugar episode.  Pt states overall good compliance with meds, mostly trying to follow appropriate diet, with wt overall stable Wt Readings from Last 3 Encounters:  08/19/17 149 lb (67.6 kg)  08/02/17 148 lb 12.8 oz (67.5 kg)  07/21/17 147 lb (66.7 kg)   Past Medical History:  Diagnosis Date  . ALLERGIC RHINITIS 01/23/2007  . Arthritis    hands  . BENIGN PROSTATIC HYPERTROPHY 09/10/2006  . Chronic headaches   . Chronic sinusitis   . Complication of anesthesia    difficulty voiding- if cath needed needs to placed with need to be done with scope  . COPD, MILD 04/03/2010   patient denies on preop visit of 08/02/2014   . Difficulty in urination   . Dyspnea   . FATIGUE 01/08/2008  . GERD 04/15/2010  . H. pylori infection    hx of   . Headache(784.0) 09/10/2006  . HOARSENESS 04/15/2010  . HYPERLIPIDEMIA 09/10/2006  . INSOMNIA-SLEEP DISORDER-UNSPEC 04/24/2009  . Lung cancer ([place])   . PAF (paroxysmal atrial fibrillation) ([place])    identified on EKG 04/08/17  . PEPTIC ULCER DISEASE 01/23/2007  . Pneumonia    1955   Past Surgical History:  Procedure Laterality Date  . CATARACT EXTRACTION Left   . COLONOSCOPY    . CYSTOSCOPY N/A 10/07/2016   Procedure: CYSTOSCOPY;  Surgeon: Festus Aloe, MD;  Location: [facility];  Service: Urology;  Laterality: N/A;  . HEMORRHOID BANDING    . INSERTION OF SUPRAPUBIC CATHETER N/A 10/07/2016   Procedure: FOLEY  CATHETER PLACEMENT;  Surgeon: Festus Aloe,  MD;  Location: [facility];  Service: Urology;  Laterality: N/A;  . KNEE ARTHROSCOPY Left    torn meniscus  . LUMBAR LAMINECTOMY/DECOMPRESSION MICRODISCECTOMY Right 08/07/2014   Procedure: complete DECOMPRESSION LUMBAR LAMINECTOMY/MICRODISCECTOMY OF L4-L5 for spinal stenosis, DECOMPRESSION OF L3-L4;  Surgeon: Latanya Maudlin, MD;  Location: [facility];  Service: Orthopedics;  Laterality: Right;  . NASAL SINUS SURGERY Left 07/04/2017   Procedure: LEFT ENDOSCOPIC SINUS SURGERY;  Surgeon: Jerrell Belfast, MD;  Location: [facility];  Service: ENT;  Laterality: Left;  . RIGHT/LEFT HEART CATH AND CORONARY ANGIOGRAPHY N/A 06/21/2017   Procedure: RIGHT/LEFT HEART CATH AND CORONARY ANGIOGRAPHY;  Surgeon: Belva Crome, MD;  Location: [facility];  Service: Cardiovascular;  Laterality: N/A;  . ROTATOR CUFF REPAIR Right   . SHOULDER ARTHROSCOPY     x3, L & R  . SINUS ENDO WITH FUSION N/A 07/04/2017   Procedure: SINUS ENDO WITH FUSION;  Surgeon: Jerrell Belfast, MD;  Location: [facility];  Service: ENT;  Laterality: N/A;  . VIDEO ASSISTED THORACOSCOPY (VATS)/ LOBECTOMY Right 10/07/2016   Procedure: VIDEO ASSISTED THORACOSCOPY (VATS)/RIGHT MIDDLE LOBECTOMY;  Surgeon: [name], MD;  Location: [facility];  Service: Thoracic;  Laterality: Right;  [name] VIDEO BRONCHOSCOPY WITH ENDOBRONCHIAL NAVIGATION N/A 09/23/2016   Procedure: VIDEO BRONCHOSCOPY WITH ENDOBRONCHIAL NAVIGATION;  Surgeon: [name], MD;  Location: [facility];  Service: Thoracic;  Laterality: N/A;  . VIDEO BRONCHOSCOPY WITH ENDOBRONCHIAL ULTRASOUND N/A 09/23/2016   Procedure: VIDEO BRONCHOSCOPY WITH ENDOBRONCHIAL ULTRASOUND;  Surgeon: [name], MD;  Location: [facility];  Service: Thoracic;  Laterality: N/A;    reports that he quit smoking about 41 years ago. He has a 23.00 pack-year smoking history. He has never used smokeless tobacco. He reports that he drinks alcohol. He reports that he does not use drugs. family history includes Alzheimer's  disease in his mother and sister; Prostate cancer in his brother. Allergies  Allergen Reactions  . Alfuzosin Other (See Comments) and Hypertension    BP went crazy, dizzy, passing out   . Aricept [Donepezil Hcl] Other (See Comments)    Insomnia, headache  . Penicillins Hives and Other (See Comments)    A PCN REACTION WITH IMMEDIATE RASH, FACIAL/TONGUE/THROAT SWELLING, SOB, OR LIGHTHEADEDNESS WITH HYPOTENSION:  #  #  #  YES  #  #  #   Has patient had a PCN reaction causing severe rash involving mucus membranes or skin necrosis:No Has patient had a PCN reaction that required hospitalization:No Has patient had a PCN reaction occurring within the last 10 years:No If all of the above answers are "NO", then may proceed with Cephalosporin use.    Current Outpatient Medications on File Prior to Visit  Medication Sig Dispense Refill  . apixaban (ELIQUIS) 5 MG TABS tablet Take 1 tablet (5 mg total) by mouth 2 (two) times daily. 180 tablet 3  . calcium carbonate (TUMS - DOSED IN MG ELEMENTAL CALCIUM) 500 MG chewable tablet Chew 1 tablet by mouth 3 (three) times daily as needed for indigestion or heartburn.     . diclofenac sodium (VOLTAREN) 1 % GEL Apply 1 application topically at bedtime.    . finasteride (PROSCAR) 5 MG tablet Take 5 mg by mouth at bedtime.     . fluticasone (FLONASE) 50 MCG/ACT nasal spray Use 2 sprays in each nostril daily as needed for allergies  11  . furosemide (LASIX) 20 MG tablet Take 1 tablet (20 mg total) by mouth daily. 90 tablet 3  . Melatonin 10 MG CAPS Take 10 mg by mouth at bedtime.    . propafenone (RYTHMOL) 225 MG tablet Take 1 tablet (225 mg total) by mouth 2 (two) times daily. 60 tablet 11  . silodosin (RAPAFLO) 8 MG CAPS capsule Take 8 mg by mouth at bedtime.     . traMADol (ULTRAM) 50 MG tablet Take 50 mg by mouth every 6 (six) hours as needed for moderate pain.    [name] zolpidem (AMBIEN) 5 MG tablet Take 5 mg by mouth at bedtime as needed for sleep.     No current  facility-administered medications on file prior to visit.    Review of Systems  Constitutional: Negative for other unusual diaphoresis or sweats HENT: Negative for ear discharge or swelling Eyes: Negative for other worsening visual disturbances Respiratory: Negative for stridor or other swelling  Gastrointestinal: Negative for worsening distension or other blood Genitourinary: Negative for retention or other urinary change Musculoskeletal: Negative for other MSK pain or swelling Skin: Negative for color change or other new lesions Neurological: Negative for worsening tremors and other numbness  Psychiatric/Behavioral: Negative for worsening agitation or other fatigue All other systemneg per pt     Objective:   Physical Exam BP 106/68   Pulse 89   Temp 97.6 F (36.4 C) (Oral)   Ht 5\' 8"  (1.727 m)   Wt 149 lb (67.6 kg)  SpO2 96%   BMI 22.66 kg/m  VS noted,  Constitutional: Pt appears in NAD HENT: Head: NCAT.  Right Ear: External ear normal.  Left Ear: External ear normal.  Eyes: . Pupils are equal, round, and reactive to light. Conjunctivae and EOM are normal Nose: without d/c or deformity Neck: Neck supple. Gross normal ROM Cardiovascular: Normal rate and regular rhythm.   Pulmonary/Chest: Effort normal and breath sounds without rales or wheezing.  Abd:  Soft, NT, ND, + BS, no organomegaly Neurological: Pt is alert. At baseline orientation, motor grossly intact Skin: Skin is warm. No rashes, other new lesions, no LE edema Psychiatric: Pt behavior is normal without agitation  No other exam findings Lab Results  Component Value Date   WBC 5.7 07/04/2017   HGB 11.8 (L) 07/04/2017   HCT 36.3 (L) 07/04/2017   PLT 273 07/04/2017   GLUCOSE 83 08/02/2017   CHOL 169 03/18/2017   TRIG 122.0 03/18/2017   HDL 35.90 (L) 03/18/2017   LDLDIRECT 134.4 03/27/2010   LDLCALC 108 (H) 03/18/2017   ALT 7 05/12/2017   AST 12 05/12/2017   NA 139 08/02/2017   K 4.1 08/02/2017   CL 98  08/02/2017   CREATININE 1.17 08/02/2017   BUN 15 08/02/2017   CO2 26 08/02/2017   TSH 0.47 03/18/2017   PSA 1.91 03/18/2017   INR 1.12 07/04/2017   HGBA1C 5.8 03/18/2017   POCT glycosylated hemoglobin (Hb A1C)  Order: [id number]  Status:  Final result Visible to patient:  No (Not Released) Dx:  Hyperglycemia   Ref Range & Units 14:04 2mo ago 28yr ago  Hemoglobin A1C 4.0 - 5.6 % 5.2  5.8 R, CM 5.0 R, CM             Assessment & Plan:

## 2017-08-19 NOTE — Assessment & Plan Note (Signed)
O/w stable, cont same tx

## 2017-08-19 NOTE — Assessment & Plan Note (Signed)
Etiology unclear, for cxr, albut MDI prn trial and tessalon perle prn

## 2017-08-19 NOTE — Addendum Note (Signed)
Addended by: Aviva Signs M on: 08/19/2017 02:05 PM   Modules accepted: Orders

## 2017-08-19 NOTE — Patient Instructions (Signed)
Your A1c (for sugar) was OK today  Please take all new medication as prescribed - the inhaler and cough pills if needed  Please continue all other medications as before, and refills have been done if requested.  Please have the pharmacy call with any other refills you may need.  Please keep your appointments with your specialists as you may have planned  Please go to the XRAY Department in the Basement (go straight as you get off the elevator) for the x-ray testing  You will be contacted by phone if any changes need to be made immediately.  Otherwise, you will receive a letter about your results with an explanation, but please check with MyChart first.  Please remember to sign up for MyChart if you have not done so, as this will be important to you in the future with finding out test results, communicating by private email, and scheduling acute appointments online when needed.  Please return in 6 months, or sooner if needed

## 2017-08-22 ENCOUNTER — Telehealth: Payer: Self-pay | Admitting: Internal Medicine

## 2017-08-22 NOTE — Telephone Encounter (Signed)
none

## 2017-08-23 ENCOUNTER — Encounter: Payer: Self-pay | Admitting: Internal Medicine

## 2017-08-23 NOTE — Addendum Note (Signed)
Addended by: Dollene Primrose on: 08/23/2017 05:39 PM   Modules accepted: Orders

## 2017-08-24 ENCOUNTER — Other Ambulatory Visit: Payer: Medicare Other | Admitting: Licensed Clinical Social Worker

## 2017-08-24 ENCOUNTER — Other Ambulatory Visit: Payer: Medicare Other | Admitting: *Deleted

## 2017-08-24 ENCOUNTER — Ambulatory Visit: Payer: Medicare Other | Admitting: Internal Medicine

## 2017-08-24 DIAGNOSIS — Z515 Encounter for palliative care: Secondary | ICD-10-CM

## 2017-08-25 NOTE — Progress Notes (Signed)
COMMUNITY PALLIATIVE CARE RN NOTE  PATIENT NAME: Raymond Logan DOB: June 18, 1930 MRN: [id number]  PRIMARY CARE PROVIDER: Biagio Borg, MD  RESPONSIBLE PARTY:  Acct ID - Guarantor Home Phone Work Phone Relationship Acct Type  [id number] [name]  Self P/F     [address], [place], [place] [address]    PLAN OF CARE and INTERVENTION:  1. ADVANCE CARE PLANNING/GOALS OF CARE: Remain at home with his wife for as long as possible, Avoid hospitalizations 2. PATIENT/CAREGIVER EDUCATION: Reinforced Safety/Fall Precautions and Importance of Energy Conservation 3. DISEASE STATUS: Joint visit made with [facility]. Patient answered the door and states that he just returned home from playing a few games of ping pong at the [employer]. Patient wearing L knee brace for stability. Denies pain at time of visit, but states that he does experience mild aching pain in L knee and L shoulder at times with movement. Has pain medication available when needed. Patient states that over the past week that he feels that his memory has improved. Patient used to have difficulty remembering how to travel around town to usual places without use of GPS system, however GPS has not been required over the past week. Continues to become dyspneic with exertion requiring frequent rest periods. States that he used to be able to play 6-7 games of ping pong but now is only able to play about 3 games, and is losing to people that he used to beat consistently. Showering continues to be difficult/tiring. Continue to encourage patient to start utilizing shower chair to conserve energy. Continued dry cough. Patient uses cough drops on a regular basis to help. Patient has had to start resuming some of the grocery shopping since patient's wife has to wear a Boot on her leg from recent injury. Patient states that he is able to perform this task if he walks slowly. Intake remains adequate with good fluid intake.  Continues with nutritional supplements daily. Patient states that he recently had an appointment with his PCP. A CXR was obtained and Impression shows an increasing R spiculated hilar mass and chronic small R pleural effusion. Patient has an appointment with Dr. [name] to follow up on this next month. Patient states that if his lung cancer has reoccurred, he does not want any more surgery but is open to other options once discussed. Will continue to visit with patient on a monthly basis.  HISTORY OF PRESENT ILLNESS:  This is a 84 yo male who resides in his home with his wife. Patient continues to be followed by Palliative Care. Next visit scheduled in 1 month.  CODE STATUS: FULL CODE  ADVANCED DIRECTIVES: Y MOST FORM: yes PPS: 60%   PHYSICAL EXAM:   VITALS: Today's Vitals   08/24/17 1256  BP: 110/62  Pulse: 84  Resp: 18  SpO2: 96%  PainSc: 0-No pain    LUNGS: decreased breath sounds CARDIAC: Slight irregularity EXTREMITIES: No edema SKIN: Exposed skin dry and intact  NEURO: Alert and oriented x 3, pleasant mood, forgetful, ambulates independently    (Duration of visit and documentation 90 minutes)       Daryl Eastern, RN, BSN

## 2017-08-25 NOTE — Progress Notes (Signed)
COMMUNITY PALLIATIVE CARE SW NOTE  PATIENT NAME: Raymond Logan DOB: 04-01-1930 MRN: [id number]  PRIMARY CARE PROVIDER: Biagio Borg, MD  RESPONSIBLE PARTY:  Acct ID - Guarantor Home Phone Work Phone Relationship Acct Type  [id number] [name]  Self P/F     [address], [place] [address]     PLAN OF CARE and INTERVENTIONS:             1. GOALS OF CARE/ ADVANCE CARE PLANNING:  Patient wants to remain in his home with is wife.  He does not wish to have any more surgeries. 2. SOCIAL/EMOTIONAL/SPIRITUAL ASSESSMENT/ INTERVENTIONS:  SW and Palliative Care RN, Raymond Logan, met with patient and his wife, Raymond Logan.  Patient had just returned from playing table tennis.  He was frustrated because his breathing becomes labored and he cannot play as many games.  Patient reported his MD informed him that his cancer had returned.  I did not explore his feelings about the cancer when SW prompted him.  He did participate in extensive life review, which he had not done previously.  He displayed a bright affect during this time.  SW provided active listening and supportive counseling.  Patient's wife participated in the last few minutes of the visit and had disjointed thoughts. 3. PATIENT/CAREGIVER EDUCATION/ COPING:  Continue providing education regarding Palliative Care and patient's medical options.  He copes by expressing his thoughts and feelings. 4. PERSONAL EMERGENCY PLAN:  When patient feels out of breath, he will sit down. 5. COMMUNITY RESOURCES COORDINATION/ HEALTH CARE NAVIGATION:  None per patient. 6. FINANCIAL/LEGAL CONCERNS/INTERVENTIONS:  None per patient.     SOCIAL HX:  Social History   Tobacco Use  . Smoking status: Former Smoker    Packs/day: 1.00    Years: 23.00    Pack years: 23.00    Last attempt to quit: 1978    Years since quitting: 41.5  . Smokeless tobacco: Never Used  Substance Use Topics  . Alcohol use: Yes    Comment: rare    CODE STATUS:   Full code  ADVANCED DIRECTIVES: N MOST FORM COMPLETE:  N HOSPICE EDUCATION PROVIDED: N  PPS:  Patient's appetite it normal.  He can stand and walk independently. Duration of visit and documentation:  90 minutes.      Raymond Corn Lois Slagel, LCSW

## 2017-08-26 ENCOUNTER — Ambulatory Visit: Payer: Medicare Other | Admitting: Internal Medicine

## 2017-08-30 ENCOUNTER — Encounter (HOSPITAL_COMMUNITY): Payer: Self-pay

## 2017-08-30 ENCOUNTER — Telehealth: Payer: Self-pay | Admitting: *Deleted

## 2017-08-30 ENCOUNTER — Ambulatory Visit (HOSPITAL_COMMUNITY)
Admission: RE | Admit: 2017-08-30 | Discharge: 2017-08-30 | Disposition: A | Discharge status: Home / Self Care | Payer: Medicare Other | Source: Ambulatory Visit | Attending: Oncology | Admitting: Oncology

## 2017-08-30 ENCOUNTER — Inpatient Hospital Stay: Payer: Medicare Other | Attending: Internal Medicine

## 2017-08-30 DIAGNOSIS — Z923 Personal history of irradiation: Secondary | ICD-10-CM | POA: Insufficient documentation

## 2017-08-30 DIAGNOSIS — I700 Atherosclerosis of aorta: Secondary | ICD-10-CM | POA: Insufficient documentation

## 2017-08-30 DIAGNOSIS — R531 Weakness: Secondary | ICD-10-CM | POA: Diagnosis not present

## 2017-08-30 DIAGNOSIS — R0609 Other forms of dyspnea: Secondary | ICD-10-CM | POA: Insufficient documentation

## 2017-08-30 DIAGNOSIS — R634 Abnormal weight loss: Secondary | ICD-10-CM | POA: Diagnosis not present

## 2017-08-30 DIAGNOSIS — C342 Malignant neoplasm of middle lobe, bronchus or lung: Secondary | ICD-10-CM | POA: Insufficient documentation

## 2017-08-30 DIAGNOSIS — J90 Pleural effusion, not elsewhere classified: Secondary | ICD-10-CM | POA: Diagnosis not present

## 2017-08-30 DIAGNOSIS — R918 Other nonspecific abnormal finding of lung field: Secondary | ICD-10-CM | POA: Diagnosis not present

## 2017-08-30 LAB — CMP (CANCER CENTER ONLY)
ALBUMIN: 3.6 g/dL (ref 3.5–5.0)
ALT: 9 U/L (ref 0–44)
ANION GAP: 6 (ref 5–15)
AST: 13 U/L — ABNORMAL LOW (ref 15–41)
Alkaline Phosphatase: 126 U/L (ref 38–126)
BUN: 16 mg/dL (ref 8–23)
CO2: 31 mmol/L (ref 22–32)
Calcium: 9.3 mg/dL (ref 8.9–10.3)
Chloride: 102 mmol/L (ref 98–111)
Creatinine: 1.05 mg/dL (ref 0.61–1.24)
GFR, Est AFR Am: >60 mL/min (ref >60)
Glucose, Bld: 91 mg/dL (ref 70–99)
POTASSIUM: 3.9 mmol/L (ref 3.5–5.1)
Sodium: 139 mmol/L (ref 135–145)
Total Bilirubin: 0.2 mg/dL — ABNORMAL LOW (ref 0.3–1.2)
Total Protein: 7.2 g/dL (ref 6.5–8.1)

## 2017-08-30 LAB — CBC WITH DIFFERENTIAL (CANCER CENTER ONLY)
BASOS ABS: 0 10*3/uL (ref 0.0–0.1)
Basophils Relative: 0 %
Eosinophils Absolute: 0.1 10*3/uL (ref 0.0–0.5)
Eosinophils Relative: 2 %
HEMATOCRIT: 35.9 % — AB (ref 38.4–49.9)
HEMOGLOBIN: 11.9 g/dL — AB (ref 13.0–17.1)
LYMPHS PCT: 14 %
Lymphs Abs: 0.7 10*3/uL — ABNORMAL LOW (ref 0.9–3.3)
MCH: 29.5 pg (ref 27.2–33.4)
MCHC: 33.1 g/dL (ref 32.0–36.0)
MCV: 89.1 fL (ref 79.3–98.0)
Monocytes Absolute: 0.5 10*3/uL (ref 0.1–0.9)
Monocytes Relative: 11 %
NEUTROS ABS: 3.6 10*3/uL (ref 1.5–6.5)
Neutrophils Relative %: 73 %
Platelet Count: 243 10*3/uL (ref 140–400)
RBC: 4.03 MIL/uL — AB (ref 4.20–5.82)
RDW: 13.4 % (ref 11.0–14.6)
WBC: 4.9 10*3/uL (ref 4.0–10.3)

## 2017-08-30 MED ORDER — IOHEXOL 300 MG/ML  SOLN
75.0000 mL | Freq: Once | INTRAMUSCULAR | Status: AC | PRN
  Administered 2017-08-30: 75 mL via INTRAVENOUS

## 2017-08-30 NOTE — Telephone Encounter (Signed)
"  Patient came to radiology for scan but did not come for lab.  We have what we need with June 4th lab results.  Do we need to send him now or after scans for lab work?"  Radiology will proceed with scans and send patient to [facility] for lab after scans.  Lab notified of today's events and to expect patient to arrive after completion of scans.

## 2017-09-06 ENCOUNTER — Encounter: Payer: Medicare Other | Admitting: Thoracic Surgery (Cardiothoracic Vascular Surgery)

## 2017-09-08 ENCOUNTER — Ambulatory Visit: Payer: Medicare Other | Admitting: Neurology

## 2017-09-13 ENCOUNTER — Telehealth: Payer: Self-pay | Admitting: Licensed Clinical Social Worker

## 2017-09-13 NOTE — Telephone Encounter (Signed)
Palliative Care SW returned call from patient's daughter, [name].  Patient has given SW permission to speak with her.  She wants to review the MOST form during visit on Wednesday, 7/17.  SW provided active listening while [name] discussed the challenges she has had with her families care needs.

## 2017-09-14 ENCOUNTER — Other Ambulatory Visit: Payer: Medicare Other | Admitting: Licensed Clinical Social Worker

## 2017-09-14 ENCOUNTER — Encounter: Payer: Self-pay | Admitting: Oncology

## 2017-09-14 ENCOUNTER — Other Ambulatory Visit: Payer: Self-pay

## 2017-09-14 ENCOUNTER — Telehealth: Payer: Self-pay | Admitting: Oncology

## 2017-09-14 ENCOUNTER — Inpatient Hospital Stay (HOSPITAL_BASED_OUTPATIENT_CLINIC_OR_DEPARTMENT_OTHER): Payer: Medicare Other | Admitting: Oncology

## 2017-09-14 VITALS — BP 99/64 | HR 98 | Temp 98.3°F | Resp 18 | Ht 68.0 in | Wt 144.0 lb

## 2017-09-14 DIAGNOSIS — R531 Weakness: Secondary | ICD-10-CM

## 2017-09-14 DIAGNOSIS — Z923 Personal history of irradiation: Secondary | ICD-10-CM

## 2017-09-14 DIAGNOSIS — Z515 Encounter for palliative care: Secondary | ICD-10-CM

## 2017-09-14 DIAGNOSIS — R0609 Other forms of dyspnea: Secondary | ICD-10-CM

## 2017-09-14 DIAGNOSIS — R918 Other nonspecific abnormal finding of lung field: Secondary | ICD-10-CM

## 2017-09-14 DIAGNOSIS — C342 Malignant neoplasm of middle lobe, bronchus or lung: Secondary | ICD-10-CM | POA: Diagnosis not present

## 2017-09-14 DIAGNOSIS — R634 Abnormal weight loss: Secondary | ICD-10-CM | POA: Diagnosis not present

## 2017-09-14 DIAGNOSIS — I5030 Unspecified diastolic (congestive) heart failure: Secondary | ICD-10-CM | POA: Insufficient documentation

## 2017-09-14 NOTE — Telephone Encounter (Signed)
Scheduled apt per 7/17 los - gave patient AVS and calender per los. Central radiology to contact pt with scan

## 2017-09-14 NOTE — Assessment & Plan Note (Addendum)
This is a very pleasant 84 year old white male recently diagnosed with a stage IA(T1c, N0, M0) non-small cell lung cancer, adenocarcinoma status post right middle lobectomy with lymph node dissection withvery close vascular resection margin diagnosed in July 2018.  The patient completed radiation to the right lung in November 2018. He had a recent restaging CT scan and is here to discuss the results.  The patient was seen with Dr. Julien Nordmann.  CT scan results were discussed with the patient and his daughter.   Images were reviewed with the patient and his daughter.  The CT scan shows growth in the right lower lobe pulmonary nodule which could represent disease recurrence.  Recommend the patient proceed with a restaging PET scan to evaluate for recurrence and metastatic disease. The patient will return in approximately 2 weeks to discuss the PET scan results.  Further treatment plan will be discussed pending the results of the PET scan.  The patient and his daughter are in agreement with this plan.  He was advised to call immediately if he has any concerning symptoms in the interval. The patient voices understanding of current disease status and treatment options and is in agreement with the current care plan. All questions were answered. The patient knows to call the clinic with any problems, questions or concerns. We can certainly see the patient much sooner if necessary.

## 2017-09-14 NOTE — Progress Notes (Signed)
COMMUNITY PALLIATIVE CARE SW NOTE  PATIENT NAME: Raymond Logan DOB: 11-May-1930 MRN: [id number]  PRIMARY CARE PROVIDER: Biagio Borg, MD  RESPONSIBLE PARTY:  Acct ID - Guarantor Home Phone Work Phone Relationship Acct Type  [id number] [name]  Self P/F     [address], [place], [place] [address]     PLAN OF CARE and INTERVENTIONS:             1. GOALS OF CARE/ ADVANCE CARE PLANNING:  Patient wants to remain in his home.  Discussed code status and MOST form. 2. SOCIAL/EMOTIONAL/SPIRITUAL ASSESSMENT/ INTERVENTIONS:  SW met with patient and his daughter, [name].  Patient had an MD appointment earlier regarding the return of his lung cancer.  It was recommended patient have a PET scan to further determine treatment options.  Patient stated he did not want any further surgeries.  Explored advanced care plan options.  He said he would like to be a DNR.  SW to discuss MOST form with NP for completion. 3. PATIENT/CAREGIVER EDUCATION/ COPING:  Patient appears neutral regarding his cancer.  He expresses his feelings openly.  SW provided education regarding MOST form and DNR status. 4. PERSONAL EMERGENCY PLAN:  Patient will rest when he is fatigued. 5. COMMUNITY RESOURCES COORDINATION/ HEALTH CARE NAVIGATION:  None per patient. 6. FINANCIAL/LEGAL CONCERNS/INTERVENTIONS:  None per patient.      SOCIAL HX:  Social History   Tobacco Use  . Smoking status: Former Smoker    Packs/day: 1.00    Years: 23.00    Pack years: 23.00    Last attempt to quit: 1978    Years since quitting: 41.5  . Smokeless tobacco: Never Used  Substance Use Topics  . Alcohol use: Yes    Comment: rare    CODE STATUS:  Full Code  ADVANCED DIRECTIVES: N MOST FORM COMPLETE:  N HOSPICE EDUCATION PROVIDED:  N PPS:  Patient reports his appetite has decreased.  Daughter states he has lost 8 pounds since the last MD visit 3 months ago.  He is able to stand and ambulate independently. Duration of visit  and documentation:  75 minutes      Creola Corn Hannalee Castor, LCSW

## 2017-09-14 NOTE — Progress Notes (Signed)
[facility] OFFICE PROGRESS NOTE  Biagio Borg, MD [place] [address]  DIAGNOSIS: stage IA(T1c, N0, M0) non-small cell lung cancer, adenocarcinoma status post right middle lobectomy with lymph node dissection withvery close vascular resection margin diagnosed in July 2018.  PRIOR THERAPY:  1) status post right middle lobectomy with lymph node dissection withvery close vascular resection margin in July 2018. 2) radiation to the right lung 11/18/2016 through 12/30/2016.  CURRENT THERAPY: Observation  INTERVAL HISTORY: Raymond Logan 84 y.o. male returns for routine follow-up visit accompanied by his daughter.  The patient reports that he has ongoing dyspnea on exertion.  This is interfering with his ability to participate in activities at the senior center.  He also has lost weight since his last visit.  He reports a decreased appetite.  Patient denies fevers and chills.  Denies chest pain and hemoptysis.  He reports a nonproductive cough.  Denies nausea, vomiting, constipation, diarrhea.  The patient is currently being seen by palliative care in his home.  I have been addressing his CODE STATUS, MOST form, and living situation.  The patient had a recent restaging CT scan is here to discuss the results.  MEDICAL HISTORY: Past Medical History:  Diagnosis Date  . ALLERGIC RHINITIS 01/23/2007  . Arthritis    hands  . BENIGN PROSTATIC HYPERTROPHY 09/10/2006  . Chronic headaches   . Chronic sinusitis   . Complication of anesthesia    difficulty voiding- if cath needed needs to placed with need to be done with scope  . COPD, MILD 04/03/2010   patient denies on preop visit of 08/02/2014   . Difficulty in urination   . Dyspnea   . FATIGUE 01/08/2008  . GERD 04/15/2010  . H. pylori infection    hx of   . Headache(784.0) 09/10/2006  . HOARSENESS 04/15/2010  . HYPERLIPIDEMIA 09/10/2006  . INSOMNIA-SLEEP DISORDER-UNSPEC 04/24/2009  . Lung cancer ([facility])   .  PAF (paroxysmal atrial fibrillation) ([facility])    identified on EKG 04/08/17  . PEPTIC ULCER DISEASE 01/23/2007  . Pneumonia    1953    ALLERGIES:  is allergic to alfuzosin; aricept [donepezil hcl]; and penicillins.  MEDICATIONS:  Current Outpatient Medications  Medication Sig Dispense Refill  . albuterol (PROVENTIL HFA;VENTOLIN HFA) 108 (90 Base) MCG/ACT inhaler Inhale 2 puffs into the lungs every 6 (six) hours as needed for wheezing or shortness of breath. 1 Inhaler 11  . apixaban (ELIQUIS) 5 MG TABS tablet Take 1 tablet (5 mg total) by mouth 2 (two) times daily. 180 tablet 3  . calcium carbonate (TUMS - DOSED IN MG ELEMENTAL CALCIUM) 500 MG chewable tablet Chew 1 tablet by mouth 3 (three) times daily as needed for indigestion or heartburn.     . diclofenac sodium (VOLTAREN) 1 % GEL Apply 1 application topically at bedtime.    . finasteride (PROSCAR) 5 MG tablet Take 5 mg by mouth at bedtime.     . fluticasone (FLONASE) 50 MCG/ACT nasal spray Use 2 sprays in each nostril daily as needed for allergies  11  . furosemide (LASIX) 20 MG tablet Take 1 tablet (20 mg total) by mouth daily. 90 tablet 3  . propafenone (RYTHMOL) 225 MG tablet Take 1 tablet (225 mg total) by mouth 2 (two) times daily. 60 tablet 11  . silodosin (RAPAFLO) 8 MG CAPS capsule Take 8 mg by mouth at bedtime.     . traMADol (ULTRAM) 50 MG tablet Take 50  mg by mouth every 6 (six) hours as needed for moderate pain.    [name] zolpidem (AMBIEN) 5 MG tablet Take 5 mg by mouth at bedtime as needed for sleep.    . benzonatate (TESSALON PERLES) 100 MG capsule Take 1 capsule (100 mg total) by mouth 2 (two) times daily as needed for cough. (Patient not taking: Reported on 09/14/2017) 60 capsule 2  . Melatonin 10 MG CAPS Take 10 mg by mouth at bedtime.     No current facility-administered medications for this visit.     SURGICAL HISTORY:  Past Surgical History:  Procedure Laterality Date  . CATARACT EXTRACTION Left   . COLONOSCOPY    .  CYSTOSCOPY N/A 10/07/2016   Procedure: CYSTOSCOPY;  Surgeon: Festus Aloe, MD;  Location: [facility];  Service: Urology;  Laterality: N/A;  . HEMORRHOID BANDING    . INSERTION OF SUPRAPUBIC CATHETER N/A 10/07/2016   Procedure: FOLEY  CATHETER PLACEMENT;  Surgeon: Festus Aloe, MD;  Location: [facility];  Service: Urology;  Laterality: N/A;  . KNEE ARTHROSCOPY Left    torn meniscus  . LUMBAR LAMINECTOMY/DECOMPRESSION MICRODISCECTOMY Right 08/07/2014   Procedure: complete DECOMPRESSION LUMBAR LAMINECTOMY/MICRODISCECTOMY OF L4-L5 for spinal stenosis, DECOMPRESSION OF L3-L4;  Surgeon: Latanya Maudlin, MD;  Location: [facility];  Service: Orthopedics;  Laterality: Right;  . NASAL SINUS SURGERY Left 07/04/2017   Procedure: LEFT ENDOSCOPIC SINUS SURGERY;  Surgeon: Jerrell Belfast, MD;  Location: [facility];  Service: ENT;  Laterality: Left;  . RIGHT/LEFT HEART CATH AND CORONARY ANGIOGRAPHY N/A 06/21/2017   Procedure: RIGHT/LEFT HEART CATH AND CORONARY ANGIOGRAPHY;  Surgeon: Belva Crome, MD;  Location: [facility];  Service: Cardiovascular;  Laterality: N/A;  . ROTATOR CUFF REPAIR Right   . SHOULDER ARTHROSCOPY     x3, L & R  . SINUS ENDO WITH FUSION N/A 07/04/2017   Procedure: SINUS ENDO WITH FUSION;  Surgeon: Jerrell Belfast, MD;  Location: [facility];  Service: ENT;  Laterality: N/A;  . VIDEO ASSISTED THORACOSCOPY (VATS)/ LOBECTOMY Right 10/07/2016   Procedure: VIDEO ASSISTED THORACOSCOPY (VATS)/RIGHT MIDDLE LOBECTOMY;  Surgeon: [name], MD;  Location: [facility];  Service: Thoracic;  Laterality: Right;  [name] VIDEO BRONCHOSCOPY WITH ENDOBRONCHIAL NAVIGATION N/A 09/23/2016   Procedure: VIDEO BRONCHOSCOPY WITH ENDOBRONCHIAL NAVIGATION;  Surgeon: [name], MD;  Location: [facility];  Service: Thoracic;  Laterality: N/A;  . VIDEO BRONCHOSCOPY WITH ENDOBRONCHIAL ULTRASOUND N/A 09/23/2016   Procedure: VIDEO BRONCHOSCOPY WITH ENDOBRONCHIAL ULTRASOUND;  Surgeon: [name], MD;  Location: [facility];   Service: Thoracic;  Laterality: N/A;    REVIEW OF SYSTEMS:   Review of Systems  Constitutional: Negative for chills, fever. Positive for fatigue, decreased appetite, and weight loss. HENT:   Negative for mouth sores, nosebleeds, sore throat and trouble swallowing.   Eyes: Negative for eye problems and icterus.  Respiratory: Negative for hemoptysis and wheezing.  Positive for nonproductive cough and shortness of breath with exertion.  Cardiovascular: Negative for chest pain and leg swelling.  Gastrointestinal: Negative for abdominal pain, constipation, diarrhea, nausea and vomiting.  Genitourinary: Negative for bladder incontinence, difficulty urinating, dysuria, frequency and hematuria.   Musculoskeletal: Negative for back pain, gait problem, neck pain and neck stiffness. Positive for left knee pain. Skin: Negative for itching and rash.  Neurological: Negative for dizziness, extremity weakness, gait problem, headaches, light-headedness and seizures.  Hematological: Negative for adenopathy. Does not bruise/bleed easily.  Psychiatric/Behavioral: Negative for confusion, depression and sleep disturbance. The patient is not nervous/anxious.  PHYSICAL EXAMINATION:  Blood pressure 99/64, pulse 98, temperature 98.3 F (36.8 C), temperature source Oral, resp. rate 18, height 5\' 8"  (1.727 m), weight 144 lb (65.3 kg), SpO2 97 %.  ECOG PERFORMANCE STATUS: 1 - Symptomatic but completely ambulatory  Physical Exam  Constitutional: Oriented to person, place, and time. No distress.  HENT:  Head: Normocephalic and atraumatic.  Mouth/Throat: Oropharynx is clear and moist. No oropharyngeal exudate.  Eyes: Conjunctivae are normal. Right eye exhibits no discharge. Left eye exhibits no discharge. No scleral icterus.  Neck: Normal range of motion. Neck supple.  Cardiovascular: Normal rate, regular rhythm, normal heart sounds and intact distal pulses.   Pulmonary/Chest: Effort normal and breath sounds  normal. No respiratory distress. No wheezes. No rales.  Abdominal: Soft. Bowel sounds are normal. Exhibits no distension and no mass. There is no tenderness.  Musculoskeletal: Normal range of motion. Exhibits no edema.  Lymphadenopathy:    No cervical adenopathy.  Neurological: Alert and oriented to person, place, and time. Exhibits normal muscle tone. Gait normal. Coordination normal.  Skin: Skin is warm and dry. No rash noted. Not diaphoretic. No erythema. No pallor.  Psychiatric: Mood, memory and judgment normal.  Vitals reviewed.  LABORATORY DATA: Lab Results  Component Value Date   WBC 4.9 08/30/2017   HGB 11.9 (L) 08/30/2017   HCT 35.9 (L) 08/30/2017   MCV 89.1 08/30/2017   PLT 243 08/30/2017      Chemistry      Component Value Date/Time   NA 139 08/30/2017 1302   NA 139 08/02/2017 1242   NA 142 11/04/2016 1334   K 3.9 08/30/2017 1302   K 4.0 11/04/2016 1334   CL 102 08/30/2017 1302   CO2 31 08/30/2017 1302   CO2 26 11/04/2016 1334   BUN 16 08/30/2017 1302   BUN 15 08/02/2017 1242   BUN 15.4 11/04/2016 1334   CREATININE 1.05 08/30/2017 1302   CREATININE 1.2 11/04/2016 1334      Component Value Date/Time   CALCIUM 9.3 08/30/2017 1302   CALCIUM 9.3 11/04/2016 1334   ALKPHOS 126 08/30/2017 1302   ALKPHOS 91 11/04/2016 1334   AST 13 (L) 08/30/2017 1302   AST 14 11/04/2016 1334   ALT 9 08/30/2017 1302   ALT 10 11/04/2016 1334   BILITOT 0.2 (L) 08/30/2017 1302   BILITOT 0.37 11/04/2016 1334       RADIOGRAPHIC STUDIES:  Dg Chest 2 View  Result Date: 08/19/2017 CLINICAL DATA:  Cough, congestion and shortness of breath on exertion for 3 months. History of lung cancer. EXAM: CHEST - 2 VIEW COMPARISON:  CT chest April 12, 2017 and chest radiograph March 08, 2017 FINDINGS: Enlarging RIGHT hilar spiculated mass with reduced traction and volume loss. RIGHT mid lung zone surgical staples. Small RIGHT pleural effusion similar to prior radiograph. RIGHT apical pleural  thickening. No pneumothorax. Cardiac silhouette is normal in size. Calcified aortic arch. Soft tissue planes and included osseous structures are nonacute. IMPRESSION: Increasing spiculated RIGHT hilar mass, this could reflect progressive radiation fibrosis or tumor progression. Postoperative changes RIGHT lung with chronic small RIGHT pleural effusion. Aortic Atherosclerosis (ICD10-I70.0). Electronically Signed   By: Elon Alas M.D.   On: 08/19/2017 14:36   Ct Chest W Contrast  Result Date: 08/30/2017 CLINICAL DATA:  Right middle lobe lung adenocarcinoma status post right middle lobectomy 10/07/2016. Adjuvant radiation therapy to the right lung completed 12/30/2016 due to very close vascular resection margin. Patient presents for restaging. EXAM: CT CHEST WITH CONTRAST TECHNIQUE:  Multidetector CT imaging of the chest was performed during intravenous contrast administration. CONTRAST:  82mL OMNIPAQUE IOHEXOL 300 MG/ML  SOLN COMPARISON:  04/12/2017 chest CT angiogram. 08/19/2017 chest radiograph. 08/25/2016 PET-CT. FINDINGS: Cardiovascular: Normal heart size. Trace pericardial effusion/thickening, not definitely changed. Atherosclerotic nonaneurysmal thoracic aorta. Normal caliber pulmonary arteries. No central pulmonary emboli. Mediastinum/Nodes: No discrete thyroid nodules. Unremarkable esophagus. No pathologically enlarged axillary, mediastinal or hilar lymph nodes. Lungs/Pleura: No pneumothorax. Small dependent right pleural effusion. No left pleural effusion. Status post right middle lobectomy. Sharply marginated upper right perihilar consolidation with associated volume loss, distortion and mild bronchiectasis, compatible with evolving radiation fibrosis. Right upper lobe 4 mm solid pulmonary nodule (series 5/image 32), stable since 08/17/2016 chest CT, probably benign. Subsolid 1.7 cm right lower lobe pulmonary nodule (series 5/image 90), increased from 0.6 cm on 04/12/2017. Anterior left lower lobe 4  mm solid pulmonary nodule associated with the fissure (series 5/image 78), stable since 08/17/2016 chest CT, probably benign. No additional significant pulmonary nodules. Upper abdomen: Small hiatal hernia. Stable scarring and subcentimeter hypodense renal cortical lesion in the upper left kidney. Musculoskeletal: No aggressive appearing focal osseous lesions. Mild thoracic spondylosis. IMPRESSION: 1. Interval growth of subsolid 1.7 cm right lower lobe pulmonary nodule, cannot exclude ipsilateral pulmonary metastasis versus metachronous primary bronchogenic adenocarcinoma. 2. No additional potential findings of metastatic disease in the chest. 3. Evolving perihilar radiation fibrosis in the upper right lung. 4. Small dependent right pleural effusion. Aortic Atherosclerosis (ICD10-I70.0). Electronically Signed   By: Ilona Sorrel M.D.   On: 08/30/2017 15:34     ASSESSMENT/PLAN:  Malignant neoplasm of middle lobe of right lung [facility]) This is a very pleasant 84 year old white male recently diagnosed with a stage IA(T1c, N0, M0) non-small cell lung cancer, adenocarcinoma status post right middle lobectomy with lymph node dissection withvery close vascular resection margin diagnosed in July 2018.  The patient completed radiation to the right lung in November 2018. He had a recent restaging CT scan and is here to discuss the results.  The patient was seen with Dr. Julien Nordmann.  CT scan results were discussed with the patient and his daughter.   Images were reviewed with the patient and his daughter.  The CT scan shows growth in the right lower lobe pulmonary nodule which could represent disease recurrence.  Recommend the patient proceed with a restaging PET scan to evaluate for recurrence and metastatic disease. The patient will return in approximately 2 weeks to discuss the PET scan results.  Further treatment plan will be discussed pending the results of the PET scan.  The patient and his daughter are in  agreement with this plan.  He was advised to call immediately if he has any concerning symptoms in the interval. The patient voices understanding of current disease status and treatment options and is in agreement with the current care plan. All questions were answered. The patient knows to call the clinic with any problems, questions or concerns. We can certainly see the patient much sooner if necessary.   Orders Placed This Encounter  Procedures  . NM PET Image Restag (PS) Skull Base To Thigh    Standing Status:   Future    Standing Expiration Date:   09/14/2018    Order Specific Question:   ** REASON FOR EXAM (FREE TEXT)    Answer:   Hx of lung cancer. Enlarging pulmonary nodule. Eval for recurrrence and metastasis.    Order Specific Question:   If indicated for the ordered procedure,  I authorize the administration of a radiopharmaceutical per Radiology protocol    Answer:   Yes    Order Specific Question:   Preferred imaging location?    Answer:   [facility]    Order Specific Question:   Radiology Contrast Protocol - do NOT remove file path    Answer:   [email or web address]   Mikey Bussing, DNP, AGPCNP-BC, AOCNP 09/14/17  ADDENDUM: Hematology/Oncology Attending: I had a face-to-face encounter with the patient today.  I recommended his care plan.  This is a very pleasant 84 years old white male with history of stage Ia non-small cell lung cancer status post right middle lobectomy with lymph node dissection and has closed resection margin.  He underwent adjuvant radiotherapy to this area. The patient presented today for evaluation complaining of increasing fatigue and weakness as well as weight loss. Repeat CT scan of the chest showed development of right lower lobe pulmonary nodule suspicious for disease recurrence. I personally and independently reviewed the scan images and discussed the result and showed the images to the patient and his  daughter. I recommended for the patient to have a PET scan performed in the next 1-2 weeks for further evaluation of this lesion. I will see him back for follow-up visit in 3 weeks for evaluation and recommendation regarding treatment of his condition based on the PET scan results. The patient was advised to call immediately if he has any concerning symptoms in the interval. Disclaimer: This note was dictated with voice recognition software. Similar sounding words can inadvertently be transcribed and may be missed upon review. Eilleen Kempf, MD 09/14/17

## 2017-09-15 ENCOUNTER — Ambulatory Visit: Payer: Medicare Other | Admitting: Internal Medicine

## 2017-09-16 ENCOUNTER — Other Ambulatory Visit: Payer: Medicare Other | Admitting: *Deleted

## 2017-09-16 DIAGNOSIS — Z515 Encounter for palliative care: Secondary | ICD-10-CM

## 2017-09-19 NOTE — Progress Notes (Signed)
COMMUNITY PALLIATIVE CARE RN NOTE  PATIENT NAME: SOVEREIGN RAMIRO DOB: Mar 31, 1930 MRN: [id number]  PRIMARY CARE PROVIDER: Biagio Borg, MD  RESPONSIBLE PARTY:  Acct ID - Guarantor Home Phone Work Phone Relationship Acct Type  [id number] [name]361 [phone]  Self P/F     [address], [place],  [address]    PLAN OF CARE and INTERVENTION:  1. ADVANCE CARE PLANNING/GOALS OF CARE: Remain at home for as long as possible, avoid hospitalizations 2. PATIENT/CAREGIVER EDUCATION: Reinforced Safety/Fall Precautions, Energy Conservation 3. DISEASE STATUS: Patient answered door upon arrival. Remains ambulatory without assistive devices and able to drive. Very pleasant and engaging. Discussed recent CT scan where it shows that the right lower lobe pulmonary nodule has increased from 0.6cm to 1.7cm. Patient awaiting call back to find out when his PET scan will be. Patient states that he has not been playing his ping pong at the [facility] as regularly over the past few weeks, due to increased fatigue. States that he will try to go this evening to see how he does. Continues to tire very easily and requires rest periods. Patient has started having to grocery shop again since his wife is unable at this time. He is able to perform this task as long as he walks slowly and takes his time. Patient admits to his appetite decreasing overall and has lost weight. States that he now requires wearing a belt since his pants are looser. Patient used to drink 3 nutritional supplements per day, however may only drink once daily. Continues with nonproductive cough and using cough drops regularly to help. Dyspnea noted with exertion, but patient is able to recover within 30 seconds at rest. Denies pain, but does wear a L knee brace for stability.    HISTORY OF PRESENT ILLNESS:  This is a 84 yo male who resides at home with his wife. Palliative Care continues to follow patient to assess overall condition and assist with  symptom management needs.   CODE STATUS: FULL CODE ADVANCED DIRECTIVES: Y MOST FORM: yes PPS: 60%   PHYSICAL EXAM:   VITALS: Today's Vitals   09/16/17 1326  BP: 106/62  Pulse: 81  Resp: 18  SpO2: 97%  PainSc: 0-No pain    LUNGS: clear to auscultation  CARDIAC: HR irregular EXTREMITIES: No edema SKIN: Exposed skin dry and intact  NEURO: Alert and oriented x 3, forgetful, ambulatory   (Duration of visit and documentation 60 minutes)    Daryl Eastern, RN, BSN

## 2017-09-22 ENCOUNTER — Telehealth: Payer: Self-pay | Admitting: Oncology

## 2017-09-22 NOTE — Telephone Encounter (Signed)
Patient reschedule he need scan first

## 2017-09-23 ENCOUNTER — Ambulatory Visit: Payer: Medicare Other | Admitting: Internal Medicine

## 2017-09-27 ENCOUNTER — Ambulatory Visit (INDEPENDENT_AMBULATORY_CARE_PROVIDER_SITE_OTHER): Payer: Medicare Other | Admitting: Thoracic Surgery (Cardiothoracic Vascular Surgery)

## 2017-09-27 VITALS — BP 112/71 | HR 70 | Resp 20 | Ht 68.0 in | Wt 148.0 lb

## 2017-09-27 DIAGNOSIS — C3491 Malignant neoplasm of unspecified part of right bronchus or lung: Secondary | ICD-10-CM | POA: Diagnosis not present

## 2017-09-27 DIAGNOSIS — R300 Dysuria: Secondary | ICD-10-CM | POA: Diagnosis not present

## 2017-09-27 DIAGNOSIS — R3129 Other microscopic hematuria: Secondary | ICD-10-CM | POA: Diagnosis not present

## 2017-09-27 NOTE — Progress Notes (Signed)
[address]       [place] [address]             [phone]     HPI: Raymond Logan returns for a scheduled follow-up visit  Raymond Logan is an 84 year old man who had a thoracoscopic right middle lobectomy in August 2018 for a T1, N0, M0 stage IA adenocarcinoma.  His vascular margin was close and he underwent adjuvant radiation.  He had a lot of difficulties related to the radiation.  I last saw him in April.  He had some borderline adenopathy and 2 small lung nodules.  There also was a small to moderate pleural effusion.  He had a CT and saw Dr. Julien Nordmann about 2 weeks ago.  The CT showed a 17 mm groundglass opacity in the left lower lobe.  There were no other findings suspicious for metastatic disease.  He complains of problems with his memory.  He is not having any acute problems with his breathing.  Past Medical History:  Diagnosis Date  . ALLERGIC RHINITIS 01/23/2007  . Arthritis    hands  . BENIGN PROSTATIC HYPERTROPHY 09/10/2006  . Chronic headaches   . Chronic sinusitis   . Complication of anesthesia    difficulty voiding- if cath needed needs to placed with need to be done with scope  . COPD, MILD 04/03/2010   patient denies on preop visit of 08/02/2014   . Difficulty in urination   . Dyspnea   . FATIGUE 01/08/2008  . GERD 04/15/2010  . H. pylori infection    hx of   . Headache(784.0) 09/10/2006  . HOARSENESS 04/15/2010  . HYPERLIPIDEMIA 09/10/2006  . INSOMNIA-SLEEP DISORDER-UNSPEC 04/24/2009  . Lung cancer ([place])   . PAF (paroxysmal atrial fibrillation) ([facility])    identified on EKG 04/08/17  . PEPTIC ULCER DISEASE 01/23/2007  . Pneumonia    1953    Current Outpatient Medications  Medication Sig Dispense Refill  . albuterol (PROVENTIL HFA;VENTOLIN HFA) 108 (90 Base) MCG/ACT inhaler Inhale 2 puffs into the lungs every 6 (six) hours as needed for wheezing or shortness of breath. 1 Inhaler 11  . apixaban (ELIQUIS) 5 MG TABS tablet Take 1 tablet (5 mg  total) by mouth 2 (two) times daily. 180 tablet 3  . calcium carbonate (TUMS - DOSED IN MG ELEMENTAL CALCIUM) 500 MG chewable tablet Chew 1 tablet by mouth 3 (three) times daily as needed for indigestion or heartburn.     . diclofenac sodium (VOLTAREN) 1 % GEL Apply 1 application topically at bedtime.    . finasteride (PROSCAR) 5 MG tablet Take 5 mg by mouth at bedtime.     . fluticasone (FLONASE) 50 MCG/ACT nasal spray Use 2 sprays in each nostril daily as needed for allergies  11  . furosemide (LASIX) 20 MG tablet Take 1 tablet (20 mg total) by mouth daily. 90 tablet 3  . Melatonin 10 MG CAPS Take 10 mg by mouth at bedtime.    . propafenone (RYTHMOL) 225 MG tablet Take 1 tablet (225 mg total) by mouth 2 (two) times daily. 60 tablet 11  . silodosin (RAPAFLO) 8 MG CAPS capsule Take 8 mg by mouth at bedtime.     . traMADol (ULTRAM) 50 MG tablet Take 50 mg by mouth every 6 (six) hours as needed for moderate pain.    [name] zolpidem (AMBIEN) 5 MG tablet Take 5 mg by mouth at bedtime as needed for sleep.    . benzonatate (  TESSALON PERLES) 100 MG capsule Take 1 capsule (100 mg total) by mouth 2 (two) times daily as needed for cough. (Patient not taking: Reported on 09/27/2017) 60 capsule 2   No current facility-administered medications for this visit.     Physical Exam BP 112/71   Pulse 70   Resp 20   Ht 5\' 8"  (1.727 m)   Wt 148 lb (67.1 kg)   SpO2 98% Comment: RA  BMI 22.77 kg/m  84 year old man in no acute distress Alert and oriented x3 with no focal deficits Lungs diminished at right base, otherwise clear No cervical or subclavicular adenopathy Cardiac regular rate and rhythm  Diagnostic Tests: CT CHEST WITH CONTRAST  TECHNIQUE: Multidetector CT imaging of the chest was performed during intravenous contrast administration.  CONTRAST:  60mL OMNIPAQUE IOHEXOL 300 MG/ML  SOLN  COMPARISON:  04/12/2017 chest CT angiogram. 08/19/2017 chest radiograph. 08/25/2016  PET-CT.  FINDINGS: Cardiovascular: Normal heart size. Trace pericardial effusion/thickening, not definitely changed. Atherosclerotic nonaneurysmal thoracic aorta. Normal caliber pulmonary arteries. No central pulmonary emboli.  Mediastinum/Nodes: No discrete thyroid nodules. Unremarkable esophagus. No pathologically enlarged axillary, mediastinal or hilar lymph nodes.  Lungs/Pleura: No pneumothorax. Small dependent right pleural effusion. No left pleural effusion. Status post right middle lobectomy. Sharply marginated upper right perihilar consolidation with associated volume loss, distortion and mild bronchiectasis, compatible with evolving radiation fibrosis. Right upper lobe 4 mm solid pulmonary nodule (series 5/image 32), stable since 08/17/2016 chest CT, probably benign. Subsolid 1.7 cm right lower lobe pulmonary nodule (series 5/image 90), increased from 0.6 cm on 04/12/2017. Anterior left lower lobe 4 mm solid pulmonary nodule associated with the fissure (series 5/image 78), stable since 08/17/2016 chest CT, probably benign. No additional significant pulmonary nodules.  Upper abdomen: Small hiatal hernia. Stable scarring and subcentimeter hypodense renal cortical lesion in the upper left kidney.  Musculoskeletal: No aggressive appearing focal osseous lesions. Mild thoracic spondylosis.  IMPRESSION: 1. Interval growth of subsolid 1.7 cm right lower lobe pulmonary nodule, cannot exclude ipsilateral pulmonary metastasis versus metachronous primary bronchogenic adenocarcinoma. 2. No additional potential findings of metastatic disease in the chest. 3. Evolving perihilar radiation fibrosis in the upper right lung. 4. Small dependent right pleural effusion.  Aortic Atherosclerosis (ICD10-I70.0).   Electronically Signed   By: Ilona Sorrel M.D.   On: 08/30/2017 15:34 I personally reviewed the CT images and concur with the findings noted above.  Impression: [name] is an 84 year old man with a history of a right middle lobectomy and adjuvant radiation for a stage IA adenocarcinoma the middle lobe.  He now has a new 1.7 cm sub-solid nodule in the right lower lobe.  That is highly suspicious for a new primary bronchogenic carcinoma.  It could be a recurrence of the previous tumor but given its sub-solid nature is more likely an adenocarcinoma with lepidic spread.  He is scheduled for a PET/CT next week to further evaluate the nodule.  That will help determine if there are any other sites of concern.  He will follow-up with Dr. Julien Nordmann after the scan.  I will be happy to help with treatment if possible.  I do not think surgery is a good option and he is not anxious to consider additional surgery.  Plan: PET/CT and follow-up with Dr. Thad Ranger, MD [facility] [phone]

## 2017-09-28 ENCOUNTER — Inpatient Hospital Stay: Payer: Medicare Other | Admitting: Oncology

## 2017-10-03 ENCOUNTER — Other Ambulatory Visit: Payer: Self-pay | Admitting: Neurology

## 2017-10-03 ENCOUNTER — Encounter: Payer: Self-pay | Admitting: Neurology

## 2017-10-03 ENCOUNTER — Ambulatory Visit (INDEPENDENT_AMBULATORY_CARE_PROVIDER_SITE_OTHER): Payer: Medicare Other | Admitting: Neurology

## 2017-10-03 VITALS — BP 98/60 | HR 92 | Ht 68.0 in | Wt 148.0 lb

## 2017-10-03 DIAGNOSIS — G3184 Mild cognitive impairment, so stated: Secondary | ICD-10-CM | POA: Diagnosis not present

## 2017-10-03 MED ORDER — MEMANTINE HCL 5 MG PO TABS: ORAL_TABLET | ORAL | 0 refills | Status: DC

## 2017-10-03 NOTE — Progress Notes (Signed)
Faxed printed/signed rx memantine 5mg  tab to [facility] at [phone]. Received fax confirmation.

## 2017-10-03 NOTE — Patient Instructions (Signed)
We will start Nemenda for memory. Watch out for dizziness.

## 2017-10-03 NOTE — Progress Notes (Signed)
Reason for visit: Memory disturbance  Raymond Logan is an 84 y.o. male  History of present illness:  Raymond Logan is an 84 year old right-handed white male with a history of mild memory disturbance.  Since last seen, the patient believes that his memory has gradually worsened.  He has had ongoing problems with weight loss, he has lost 11 pounds since January 2019.  The patient in the past has been on Aricept but could not tolerate the medication.  He is still operating a motor vehicle without difficulty, he is able to manage his medications and appointments.  He denies any significant problems with managing his finances but it is taking him longer to do this.  He returns to this office for an evaluation.  He does have intermittent episodes of insomnia, he will take Ambien if needed.  Past Medical History:  Diagnosis Date  . ALLERGIC RHINITIS 01/23/2007  . Arthritis    hands  . BENIGN PROSTATIC HYPERTROPHY 09/10/2006  . Chronic headaches   . Chronic sinusitis   . Complication of anesthesia    difficulty voiding- if cath needed needs to placed with need to be done with scope  . COPD, MILD 04/03/2010   patient denies on preop visit of 08/02/2014   . Difficulty in urination   . Dyspnea   . FATIGUE 01/08/2008  . GERD 04/15/2010  . H. pylori infection    hx of   . Headache(784.0) 09/10/2006  . HOARSENESS 04/15/2010  . HYPERLIPIDEMIA 09/10/2006  . INSOMNIA-SLEEP DISORDER-UNSPEC 04/24/2009  . Lung cancer ([facility])   . PAF (paroxysmal atrial fibrillation) ([facility])    identified on EKG 04/08/17  . PEPTIC ULCER DISEASE 01/23/2007  . Pneumonia    1953    Past Surgical History:  Procedure Laterality Date  . CATARACT EXTRACTION Left   . COLONOSCOPY    . CYSTOSCOPY N/A 10/07/2016   Procedure: CYSTOSCOPY;  Surgeon: Festus Aloe, MD;  Location: [facility];  Service: Urology;  Laterality: N/A;  . HEMORRHOID BANDING    . INSERTION OF SUPRAPUBIC CATHETER N/A 10/07/2016   Procedure: FOLEY  CATHETER  PLACEMENT;  Surgeon: Festus Aloe, MD;  Location: [facility];  Service: Urology;  Laterality: N/A;  . KNEE ARTHROSCOPY Left    torn meniscus  . LUMBAR LAMINECTOMY/DECOMPRESSION MICRODISCECTOMY Right 08/07/2014   Procedure: complete DECOMPRESSION LUMBAR LAMINECTOMY/MICRODISCECTOMY OF L4-L5 for spinal stenosis, DECOMPRESSION OF L3-L4;  Surgeon: Latanya Maudlin, MD;  Location: [facility];  Service: Orthopedics;  Laterality: Right;  . NASAL SINUS SURGERY Left 07/04/2017   Procedure: LEFT ENDOSCOPIC SINUS SURGERY;  Surgeon: Jerrell Belfast, MD;  Location: [facility];  Service: ENT;  Laterality: Left;  . RIGHT/LEFT HEART CATH AND CORONARY ANGIOGRAPHY N/A 06/21/2017   Procedure: RIGHT/LEFT HEART CATH AND CORONARY ANGIOGRAPHY;  Surgeon: Belva Crome, MD;  Location: [facility];  Service: Cardiovascular;  Laterality: N/A;  . ROTATOR CUFF REPAIR Right   . SHOULDER ARTHROSCOPY     x3, L & R  . SINUS ENDO WITH FUSION N/A 07/04/2017   Procedure: SINUS ENDO WITH FUSION;  Surgeon: Jerrell Belfast, MD;  Location: [facility];  Service: ENT;  Laterality: N/A;  . VIDEO ASSISTED THORACOSCOPY (VATS)/ LOBECTOMY Right 10/07/2016   Procedure: VIDEO ASSISTED THORACOSCOPY (VATS)/RIGHT MIDDLE LOBECTOMY;  Surgeon: [name], MD;  Location: [facility];  Service: Thoracic;  Laterality: Right;  [name] VIDEO BRONCHOSCOPY WITH ENDOBRONCHIAL NAVIGATION N/A 09/23/2016   Procedure: VIDEO BRONCHOSCOPY WITH ENDOBRONCHIAL NAVIGATION;  Surgeon: [name], MD;  Location: [facility]  OR;  Service: Thoracic;  Laterality: N/A;  . VIDEO BRONCHOSCOPY WITH ENDOBRONCHIAL ULTRASOUND N/A 09/23/2016   Procedure: VIDEO BRONCHOSCOPY WITH ENDOBRONCHIAL ULTRASOUND;  Surgeon: [name], MD;  Location: [facility] OR;  Service: Thoracic;  Laterality: N/A;    Family History  Problem Relation Age of Onset  . Prostate cancer Brother   . Alzheimer's disease Mother   . Alzheimer's disease Sister     Social history:  reports that he quit smoking about 41  years ago. He has a 23.00 pack-year smoking history. He has never used smokeless tobacco. He reports that he drinks alcohol. He reports that he does not use drugs.    Allergies  Allergen Reactions  . Alfuzosin Other (See Comments) and Hypertension    BP went crazy, dizzy, passing out   . Aricept [Donepezil Hcl] Other (See Comments)    Insomnia, headache  . Penicillins Hives and Other (See Comments)    A PCN REACTION WITH IMMEDIATE RASH, FACIAL/TONGUE/THROAT SWELLING, SOB, OR LIGHTHEADEDNESS WITH HYPOTENSION:  #  #  #  YES  #  #  #   Has patient had a PCN reaction causing severe rash involving mucus membranes or skin necrosis:No Has patient had a PCN reaction that required hospitalization:No Has patient had a PCN reaction occurring within the last 10 years:No If all of the above answers are "NO", then may proceed with Cephalosporin use.     Medications:  Prior to Admission medications   Medication Sig Start Date End Date Taking? Authorizing Provider  albuterol (PROVENTIL HFA;VENTOLIN HFA) 108 (90 Base) MCG/ACT inhaler Inhale 2 puffs into the lungs every 6 (six) hours as needed for wheezing or shortness of breath. 08/19/17  Yes Biagio Borg, MD  apixaban (ELIQUIS) 5 MG TABS tablet Take 1 tablet (5 mg total) by mouth 2 (two) times daily. 04/25/17  Yes Rosita Fire, Brittainy M, PA-C  benzonatate (TESSALON PERLES) 100 MG capsule Take 1 capsule (100 mg total) by mouth 2 (two) times daily as needed for cough. 08/19/17 08/19/18 Yes Biagio Borg, MD  calcium carbonate (TUMS - DOSED IN MG ELEMENTAL CALCIUM) 500 MG chewable tablet Chew 1 tablet by mouth 3 (three) times daily as needed for indigestion or heartburn.    Yes [provider]  diclofenac sodium (VOLTAREN) 1 % GEL Apply 1 application topically at bedtime.   Yes [provider]  finasteride (PROSCAR) 5 MG tablet Take 5 mg by mouth at bedtime.    Yes [provider]  fluticasone (FLONASE) 50 MCG/ACT nasal spray Use 2  sprays in each nostril daily as needed for allergies 05/28/17  Yes [provider]  furosemide (LASIX) 20 MG tablet Take 1 tablet (20 mg total) by mouth daily. 07/21/17 10/19/17 Yes Evans Lance, MD  Melatonin 10 MG CAPS Take 10 mg by mouth at bedtime.   Yes [provider]  propafenone (RYTHMOL) 225 MG tablet Take 1 tablet (225 mg total) by mouth 2 (two) times daily. 07/21/17  Yes Evans Lance, MD  silodosin (RAPAFLO) 8 MG CAPS capsule Take 8 mg by mouth at bedtime.    Yes [provider]  traMADol (ULTRAM) 50 MG tablet Take 50 mg by mouth every 6 (six) hours as needed for moderate pain.   Yes [provider]  zolpidem (AMBIEN) 5 MG tablet Take 5 mg by mouth at bedtime as needed for sleep.   Yes [provider]    ROS:  Out of a complete 14 system review of  symptoms, the patient complains only of the following symptoms, and all other reviewed systems are negative.  Shortness of breath Frequency of urination Memory loss Confusion  Blood pressure 98/60, pulse 92, height 5\' 8"  (1.727 m), weight 148 lb (67.1 kg), SpO2 98 %.  Physical Exam  General: The patient is alert and cooperative at the time of the examination.  Skin: No significant peripheral edema is noted.   Neurologic Exam  Mental status: The patient is alert and oriented x 3 at the time of the examination. The Mini-Mental status examination done today shows a total score 25/30.   Cranial nerves: Facial symmetry is present. Speech is normal, no aphasia or dysarthria is noted. Extraocular movements are full. Visual fields are full.  Motor: The patient has good strength in all 4 extremities.  Sensory examination: Soft touch sensation is symmetric on the face, arms, and legs.  Coordination: The patient has good finger-nose-finger and heel-to-shin bilaterally.  Some apraxia with use of the lower extremities is noted.  Gait and station: The patient has a normal gait. Tandem gait  is slightly unsteady. Romberg is negative. No drift is seen.  Reflexes: Deep tendon reflexes are symmetric.   Assessment/Plan:  1.  Memory disturbance  2.  History of lung cancer  The patient has had some ongoing changes in his memory.  We will start Namenda at this time, he could not tolerate Aricept previously.  The patient will call for a maintenance dose prescription of the Namenda.  He will follow-up in about 6 months.  Jill Alexanders MD 10/03/2017 12:17 PM  [facility] [address] [place], [place] [address]  Phone [phone] Fax [phone]

## 2017-10-04 ENCOUNTER — Ambulatory Visit (HOSPITAL_COMMUNITY)
Admission: RE | Admit: 2017-10-04 | Discharge: 2017-10-04 | Disposition: A | Discharge status: Home / Self Care | Payer: Medicare Other | Source: Ambulatory Visit | Attending: Oncology | Admitting: Oncology

## 2017-10-04 DIAGNOSIS — C342 Malignant neoplasm of middle lobe, bronchus or lung: Secondary | ICD-10-CM | POA: Insufficient documentation

## 2017-10-04 DIAGNOSIS — C3490 Malignant neoplasm of unspecified part of unspecified bronchus or lung: Secondary | ICD-10-CM | POA: Diagnosis not present

## 2017-10-04 DIAGNOSIS — I898 Other specified noninfective disorders of lymphatic vessels and lymph nodes: Secondary | ICD-10-CM | POA: Diagnosis not present

## 2017-10-04 DIAGNOSIS — R918 Other nonspecific abnormal finding of lung field: Secondary | ICD-10-CM | POA: Diagnosis not present

## 2017-10-04 LAB — GLUCOSE, CAPILLARY: GLUCOSE-CAPILLARY: 103 mg/dL — AB (ref 70–99)

## 2017-10-04 MED ORDER — FLUDEOXYGLUCOSE F - 18 (FDG) INJECTION
7.4000 | Freq: Once | INTRAVENOUS | Status: AC | PRN
  Administered 2017-10-04: 7.4 via INTRAVENOUS

## 2017-10-06 ENCOUNTER — Telehealth: Payer: Self-pay | Admitting: Internal Medicine

## 2017-10-06 ENCOUNTER — Encounter: Payer: Self-pay | Admitting: *Deleted

## 2017-10-06 ENCOUNTER — Encounter: Payer: Self-pay | Admitting: Oncology

## 2017-10-06 ENCOUNTER — Inpatient Hospital Stay: Payer: Medicare Other | Attending: Internal Medicine | Admitting: Oncology

## 2017-10-06 ENCOUNTER — Other Ambulatory Visit: Payer: Self-pay

## 2017-10-06 VITALS — BP 104/67 | HR 86 | Temp 97.6°F | Resp 22 | Ht 68.0 in | Wt 148.0 lb

## 2017-10-06 DIAGNOSIS — C342 Malignant neoplasm of middle lobe, bronchus or lung: Secondary | ICD-10-CM

## 2017-10-06 DIAGNOSIS — Z923 Personal history of irradiation: Secondary | ICD-10-CM

## 2017-10-06 DIAGNOSIS — J449 Chronic obstructive pulmonary disease, unspecified: Secondary | ICD-10-CM | POA: Insufficient documentation

## 2017-10-06 DIAGNOSIS — I4891 Unspecified atrial fibrillation: Secondary | ICD-10-CM | POA: Insufficient documentation

## 2017-10-06 DIAGNOSIS — E785 Hyperlipidemia, unspecified: Secondary | ICD-10-CM | POA: Insufficient documentation

## 2017-10-06 DIAGNOSIS — Z7189 Other specified counseling: Secondary | ICD-10-CM | POA: Insufficient documentation

## 2017-10-06 NOTE — Assessment & Plan Note (Addendum)
This is a very pleasant 84 year old white male recently diagnosed with a stage IA(T1c, N0, M0) non-small cell lung cancer, adenocarcinoma status post right middle lobectomy with lymph node dissection withvery close vascular resection margin diagnosed in July 2018.The patient completed radiation to the right lung in November 2018. The patient had a recent PET scan is here to discuss the results..  The patient was seen with Dr. Julien Nordmann.  PET scan results were discussed with the patient and his daughter. Images were reviewed with the patient and his daughter.    The PET scan showed a groundglass nodule in the right lower lobe however this had low hypermetabolic activity.  The area in the right hilum had intense hypermetabolic activity which could represent inflammation secondary to radiation versus disease recurrence.  We discussed we recommend continued observation of the right lower lobe nodule.  Recommend that he see Dr. Roxan Hockey for consideration of a bronchoscopy to further evaluate the area of hyper metabolic activity in the right hilum.  Referral to Dr. Roxan Hockey has been placed.  We will also send tissue from his original biopsy for Foundation 1 testing and PD L1 testing. We have tentatively scheduled Mr. Zieger back for lab and a CT in approximately 6 months and a return visit 1 to 2 days after the CT scan to discuss the results.  However, we will see him much sooner if the biopsy shows evidence of malignancy.  The patient and his daughter are in agreement with this plan.  He was advised to call immediately if he has any concerning symptoms in the interval. The patient voices understanding of current disease status and treatment options and is in agreement with the current care plan. All questions were answered. The patient knows to call the clinic with any problems, questions or concerns. We can certainly see the patient much sooner if necessary.

## 2017-10-06 NOTE — Progress Notes (Signed)
Oncology Nurse Navigator Documentation  Oncology Nurse Navigator Flowsheets 10/06/2017  Navigator Location [facility]-  Navigator Encounter Type Other/per Dr. Julien Nordmann, I re-referred patient to see Dr. Roxan Hockey.  Patient needs to be evaluated for more tissue DX and foundation one testing.  Referral is completed and will update his office.   Patient Visit Type MedOnc  Treatment Phase Follow-up  Barriers/Navigation Needs Coordination of Care  Interventions Coordination of Care  Coordination of Care Other  Acuity Level 2  Time Spent with Patient 30

## 2017-10-06 NOTE — Telephone Encounter (Signed)
Appointments scheduled/ Referral entered in [facility]/ phone number provided for Central Scheduling regarding Ct scan ordered for Feb 2020 per 8/8 los

## 2017-10-06 NOTE — Progress Notes (Signed)
[facility] OFFICE PROGRESS NOTE  Biagio Borg, MD [place] [id number]  DIAGNOSIS:stage IA(T1c, N0, M0) non-small cell lung cancer, adenocarcinoma status post right middle lobectomy with lymph node dissection withvery close vascular resection margin diagnosed in July 2018.  PRIOR THERAPY: 1)status post right middle lobectomy with lymph node dissection withvery close vascular resection margin in July 2018. 2)radiation to the right lung 11/18/2016 through 12/30/2016.  CURRENT THERAPY:Observation  INTERVAL HISTORY: MONISH HALIBURTON 84 y.o. male returns for routine follow-up visit accompanied by his daughter.  The patient is feeling fine today and has no specific complaints except for ongoing dyspnea on exertion.  The patient is still able to participate in activities at the senior center.  He denies fevers and chills.  Denies chest pain, shortness of breath at rest, cough, hemoptysis.  Denies nausea, vomiting, constipation, diarrhea.  Denies recent weight loss or night sweats.  He continues to have difficulty with his memory and is followed by neurology.  He was recently started on Namenda.  He had a recent PET scan is here to discuss the results.  MEDICAL HISTORY: Past Medical History:  Diagnosis Date  . ALLERGIC RHINITIS 01/23/2007  . Arthritis    hands  . BENIGN PROSTATIC HYPERTROPHY 09/10/2006  . Chronic headaches   . Chronic sinusitis   . Complication of anesthesia    difficulty voiding- if cath needed needs to placed with need to be done with scope  . COPD, MILD 04/03/2010   patient denies on preop visit of 08/02/2014   . Difficulty in urination   . Dyspnea   . FATIGUE 01/08/2008  . GERD 04/15/2010  . H. pylori infection    hx of   . Headache(784.0) 09/10/2006  . HOARSENESS 04/15/2010  . HYPERLIPIDEMIA 09/10/2006  . INSOMNIA-SLEEP DISORDER-UNSPEC 04/24/2009  . Lung cancer ([facility])   . PAF (paroxysmal atrial fibrillation) ([facility])    identified  on EKG 04/08/17  . PEPTIC ULCER DISEASE 01/23/2007  . Pneumonia    1953    ALLERGIES:  is allergic to alfuzosin; aricept [donepezil hcl]; and penicillins.  MEDICATIONS:  Current Outpatient Medications  Medication Sig Dispense Refill  . albuterol (PROVENTIL HFA;VENTOLIN HFA) 108 (90 Base) MCG/ACT inhaler Inhale 2 puffs into the lungs every 6 (six) hours as needed for wheezing or shortness of breath. 1 Inhaler 11  . apixaban (ELIQUIS) 5 MG TABS tablet Take 1 tablet (5 mg total) by mouth 2 (two) times daily. 180 tablet 3  . calcium carbonate (TUMS - DOSED IN MG ELEMENTAL CALCIUM) 500 MG chewable tablet Chew 1 tablet by mouth 3 (three) times daily as needed for indigestion or heartburn.     . diclofenac sodium (VOLTAREN) 1 % GEL Apply 1 application topically at bedtime.    . finasteride (PROSCAR) 5 MG tablet Take 5 mg by mouth at bedtime.     . fluticasone (FLONASE) 50 MCG/ACT nasal spray Use 2 sprays in each nostril daily as needed for allergies  11  . furosemide (LASIX) 20 MG tablet Take 1 tablet (20 mg total) by mouth daily. 90 tablet 3  . memantine (NAMENDA) 5 MG tablet Take 1 tablet daily for one week, then take 1 tablet twice daily for one week, then take 1 tablet in the morning and 2 in the evening for one week, then take 2 tablets twice daily 70 tablet 0  . propafenone (RYTHMOL) 225 MG tablet Take 1 tablet (225 mg total) by mouth 2 (two)  times daily. 60 tablet 11  . silodosin (RAPAFLO) 8 MG CAPS capsule Take 8 mg by mouth at bedtime.     . traMADol (ULTRAM) 50 MG tablet Take 50 mg by mouth every 6 (six) hours as needed for moderate pain.    [name] zolpidem (AMBIEN) 5 MG tablet Take 5 mg by mouth at bedtime as needed for sleep.    . benzonatate (TESSALON PERLES) 100 MG capsule Take 1 capsule (100 mg total) by mouth 2 (two) times daily as needed for cough. (Patient not taking: Reported on 10/06/2017) 60 capsule 2  . Melatonin 10 MG CAPS Take 10 mg by mouth at bedtime.     No current  facility-administered medications for this visit.     SURGICAL HISTORY:  Past Surgical History:  Procedure Laterality Date  . CATARACT EXTRACTION Left   . COLONOSCOPY    . CYSTOSCOPY N/A 10/07/2016   Procedure: CYSTOSCOPY;  Surgeon: Festus Aloe, MD;  Location: [facility];  Service: Urology;  Laterality: N/A;  . HEMORRHOID BANDING    . INSERTION OF SUPRAPUBIC CATHETER N/A 10/07/2016   Procedure: FOLEY  CATHETER PLACEMENT;  Surgeon: Festus Aloe, MD;  Location: [facility];  Service: Urology;  Laterality: N/A;  . KNEE ARTHROSCOPY Left    torn meniscus  . LUMBAR LAMINECTOMY/DECOMPRESSION MICRODISCECTOMY Right 08/07/2014   Procedure: complete DECOMPRESSION LUMBAR LAMINECTOMY/MICRODISCECTOMY OF L4-L5 for spinal stenosis, DECOMPRESSION OF L3-L4;  Surgeon: Latanya Maudlin, MD;  Location: [facility];  Service: Orthopedics;  Laterality: Right;  . NASAL SINUS SURGERY Left 07/04/2017   Procedure: LEFT ENDOSCOPIC SINUS SURGERY;  Surgeon: Jerrell Belfast, MD;  Location: [facility];  Service: ENT;  Laterality: Left;  . RIGHT/LEFT HEART CATH AND CORONARY ANGIOGRAPHY N/A 06/21/2017   Procedure: RIGHT/LEFT HEART CATH AND CORONARY ANGIOGRAPHY;  Surgeon: Belva Crome, MD;  Location: [facility];  Service: Cardiovascular;  Laterality: N/A;  . ROTATOR CUFF REPAIR Right   . SHOULDER ARTHROSCOPY     x3, L & R  . SINUS ENDO WITH FUSION N/A 07/04/2017   Procedure: SINUS ENDO WITH FUSION;  Surgeon: Jerrell Belfast, MD;  Location: [facility];  Service: ENT;  Laterality: N/A;  . VIDEO ASSISTED THORACOSCOPY (VATS)/ LOBECTOMY Right 10/07/2016   Procedure: VIDEO ASSISTED THORACOSCOPY (VATS)/RIGHT MIDDLE LOBECTOMY;  Surgeon: [name], MD;  Location: [facility];  Service: Thoracic;  Laterality: Right;  [name] VIDEO BRONCHOSCOPY WITH ENDOBRONCHIAL NAVIGATION N/A 09/23/2016   Procedure: VIDEO BRONCHOSCOPY WITH ENDOBRONCHIAL NAVIGATION;  Surgeon: [name], MD;  Location: [facility];  Service: Thoracic;  Laterality: N/A;  . VIDEO  BRONCHOSCOPY WITH ENDOBRONCHIAL ULTRASOUND N/A 09/23/2016   Procedure: VIDEO BRONCHOSCOPY WITH ENDOBRONCHIAL ULTRASOUND;  Surgeon: [name], MD;  Location: [facility];  Service: Thoracic;  Laterality: N/A;    REVIEW OF SYSTEMS:   Review of Systems  Constitutional: Negative for appetite change, chills, fatigue, fever and unexpected weight change.  HENT:   Negative for mouth sores, nosebleeds, sore throat and trouble swallowing.   Eyes: Negative for eye problems and icterus.  Respiratory: Negative for cough, hemoptysis, shortness of breath at rest and wheezing.  Positive for shortness of breath with exertion. Cardiovascular: Negative for chest pain and leg swelling.  Gastrointestinal: Negative for abdominal pain, constipation, diarrhea, nausea and vomiting.  Genitourinary: Negative for bladder incontinence, difficulty urinating, dysuria, frequency and hematuria.   Musculoskeletal: Negative for back pain, gait problem, neck pain and neck stiffness.  Skin: Negative for itching and rash.  Neurological: Negative for dizziness, extremity weakness, gait  problem, headaches, light-headedness and seizures.  Hematological: Negative for adenopathy. Does not bruise/bleed easily.  Psychiatric/Behavioral: Negative for confusion, depression and sleep disturbance. The patient is not nervous/anxious.     PHYSICAL EXAMINATION:  Blood pressure 104/67, pulse 86, temperature 97.6 F (36.4 C), temperature source Oral, resp. rate (!) 22, height 5\' 8"  (1.727 m), weight 148 lb (67.1 kg), SpO2 99 %.  ECOG PERFORMANCE STATUS: 1 - Symptomatic but completely ambulatory  Physical Exam  Constitutional: Oriented to person, place, and time and well-developed, well-nourished, and in no distress. No distress.  HENT:  Head: Normocephalic and atraumatic.  Mouth/Throat: Oropharynx is clear and moist. No oropharyngeal exudate.  Eyes: Conjunctivae are normal. Right eye exhibits no discharge. Left eye exhibits no  discharge. No scleral icterus.  Neck: Normal range of motion. Neck supple.  Cardiovascular: Normal rate, regular rhythm, normal heart sounds and intact distal pulses.   Pulmonary/Chest: Effort normal and breath sounds normal. No respiratory distress. No wheezes. No rales.  Abdominal: Soft. Bowel sounds are normal. Exhibits no distension and no mass. There is no tenderness.  Musculoskeletal: Normal range of motion. Exhibits no edema.  Lymphadenopathy:    No cervical adenopathy.  Neurological: Alert and oriented to person, place, and time. Exhibits normal muscle tone. Gait normal. Coordination normal.  Skin: Skin is warm and dry. No rash noted. Not diaphoretic. No erythema. No pallor.  Psychiatric: Mood, memory and judgment normal.  Vitals reviewed.  LABORATORY DATA: Lab Results  Component Value Date   WBC 4.9 08/30/2017   HGB 11.9 (L) 08/30/2017   HCT 35.9 (L) 08/30/2017   MCV 89.1 08/30/2017   PLT 243 08/30/2017      Chemistry      Component Value Date/Time   NA 139 08/30/2017 1302   NA 139 08/02/2017 1242   NA 142 11/04/2016 1334   K 3.9 08/30/2017 1302   K 4.0 11/04/2016 1334   CL 102 08/30/2017 1302   CO2 31 08/30/2017 1302   CO2 26 11/04/2016 1334   BUN 16 08/30/2017 1302   BUN 15 08/02/2017 1242   BUN 15.4 11/04/2016 1334   CREATININE 1.05 08/30/2017 1302   CREATININE 1.2 11/04/2016 1334      Component Value Date/Time   CALCIUM 9.3 08/30/2017 1302   CALCIUM 9.3 11/04/2016 1334   ALKPHOS 126 08/30/2017 1302   ALKPHOS 91 11/04/2016 1334   AST 13 (L) 08/30/2017 1302   AST 14 11/04/2016 1334   ALT 9 08/30/2017 1302   ALT 10 11/04/2016 1334   BILITOT 0.2 (L) 08/30/2017 1302   BILITOT 0.37 11/04/2016 1334       RADIOGRAPHIC STUDIES:  Nm Pet Image Restag (ps) Skull Base To Thigh  Result Date: 10/04/2017 CLINICAL DATA:  Subsequent treatment strategy for lung carcinoma. Prior resection stage IA RIGHT lung carcinoma. New ground-glass nodule EXAM: NUCLEAR MEDICINE  PET SKULL BASE TO THIGH TECHNIQUE: 7.4 mCi F-18 FDG was injected intravenously. Full-ring PET imaging was performed from the skull base to thigh after the radiotracer. CT data was obtained and used for attenuation correction and anatomic localization. Fasting blood glucose: 103 mg/dl COMPARISON:  None. FINDINGS: Mediastinal blood pool activity: SUV max 2.35 NECK: No hypermetabolic lymph nodes in the neck. Incidental CT findings: none CHEST: The new ground-glass nodule concern in the RIGHT lower lobe measures 15 mm x 13 mm and has mild associated metabolic activity SUV max equal 1.6. There is intense activity associated with the RIGHT perihilar consolidation and atelectasis at the surgical margin with  SUV max equal 6.8. There is bronchiectasis within this consolidation. The CT findings are typical of postradiation change Within atelectasis anteromedially along the mediastinal border is a small loculated focus of gas measuring 8 mm (image 71/4) which is adjacent to suture line (image 73/4. There is a moderateRIGHT effusion similar prior LEFT lung is clear except for a nodule along the oblique fissure measuring 4 mm (image 75/4) not changed from prior. Intense hypermetabolic activity associated small LEFT hilar lymph node again present. SUV max equal 7.5 increased from 4.9. Incidental CT findings: none ABDOMEN/PELVIS: No abnormal hypermetabolic activity within the liver, pancreas, adrenal glands, or spleen. No hypermetabolic lymph nodes in the abdomen or pelvis. Incidental CT findings: Enlarged prostate gland. SKELETON: No focal hypermetabolic activity to suggest skeletal metastasis. Incidental CT findings: none IMPRESSION: 1. Ground-glass nodule RIGHT lower lobe is suspicious for adenocarcinoma in situ with suspicious morphology and mild metabolic activity. 2. Intense metabolic activity about the RIGHT hilum is favored post radiation/postsurgery inflammatory change. The activity is more intense than typical therefore  recommend follow-up imaging. 3. Small locule of gas anteriorly in the atelectatic lung against the mediastinal pleural surface. Recommend attention on follow-up to exclude a small bronchopleural fistula. 4. Persistent LEFT hilar hypermetabolic lymph node. Favor reactive node. Recommend attention on follow-up. Electronically Signed   By: Suzy Bouchard M.D.   On: 10/04/2017 16:02     ASSESSMENT/PLAN:  Malignant neoplasm of middle lobe of right lung [facility]) This is a very pleasant 84 year old white male recently diagnosed with a stage IA(T1c, N0, M0) non-small cell lung cancer, adenocarcinoma status post right middle lobectomy with lymph node dissection withvery close vascular resection margin diagnosed in July 2018.The patient completed radiation to the right lung in November 2018. The patient had a recent PET scan is here to discuss the results..  The patient was seen with Dr. Julien Nordmann.  PET scan results were discussed with the patient and his daughter. Images were reviewed with the patient and his daughter.    The PET scan showed a groundglass nodule in the right lower lobe however this had low hypermetabolic activity.  The area in the right hilum had intense hypermetabolic activity which could represent inflammation secondary to radiation versus disease recurrence.  We discussed we recommend continued observation of the right lower lobe nodule.  Recommend that he see Dr. Roxan Hockey for consideration of a bronchoscopy to further evaluate the area of hyper metabolic activity in the right hilum.  Referral to Dr. Roxan Hockey has been placed.  We will also send tissue from his original biopsy for Foundation 1 testing and PD L1 testing. We have tentatively scheduled Mr. Musich back for lab and a CT in approximately 6 months and a return visit 1 to 2 days after the CT scan to discuss the results.  However, we will see him much sooner if the biopsy shows evidence of malignancy.  The patient and his  daughter are in agreement with this plan.  He was advised to call immediately if he has any concerning symptoms in the interval. The patient voices understanding of current disease status and treatment options and is in agreement with the current care plan. All questions were answered. The patient knows to call the clinic with any problems, questions or concerns. We can certainly see the patient much sooner if necessary.   Orders Placed This Encounter  Procedures  . CT CHEST W CONTRAST    Standing Status:   Future    Standing Expiration Date:  10/07/2018    Order Specific Question:   If indicated for the ordered procedure, I authorize the administration of contrast media per Radiology protocol    Answer:   Yes    Order Specific Question:   Preferred imaging location?    Answer:   [facility]    Order Specific Question:   Radiology Contrast Protocol - do NOT remove file path    Answer:   [email or web address]    Order Specific Question:   ** REASON FOR EXAM (FREE TEXT)    Answer:   Lung cancer. Restaging.  [name] CBC with Differential (Cancer Center Only)    Standing Status:   Future    Standing Expiration Date:   10/07/2018  . CMP ([facility] only)    Standing Status:   Future    Standing Expiration Date:   10/07/2018  . Ambulatory referral to Cardiothoracic Surgery    Referral Priority:   Routine    Referral Type:   Surgical    Referral Reason:   Specialty Services Required    Referred to Provider:   [name], MD    Requested Specialty:   Cardiothoracic Surgery    Number of Visits Requested:   1     Mikey Bussing, [place], AGPCNP-BC, AOCNP 10/06/17   ADDENDUM: Hematology/Oncology Attending: I had a face-to-face encounter with the patient.  I recommended his care plan.  This is a very pleasant 84 years old white male with a stage Ia non-small cell lung cancer status post right upper lobectomy with lymph node dissection with very closed  resection margin followed by adjuvant radiotherapy completed in November 2018. He has been in observation but recently the patient was found to have increased density and interval growth of a sub-solid 1.7 cm right lower lobe pulmonary nodule suspicious for ipsilateral pulmonary metastasis versus metachronous primary bronchogenic adenocarcinoma. A PET scan was performed for evaluation of this lesion.  It showed the groundglass nodule in the right lower lobe was suspicious for adenocarcinoma in situ with suspicious morphology and mild metabolic activity.  There was intense metabolic activity about the right hilum favored radiation/postsurgical inflammatory changes.  There was also persistent left hilar hypermetabolic lymph node. I had a lengthy discussion with the patient and his daughter about his current condition.  I personally and independently reviewed the scan images and discussed the results and showed the images to the patient and his daughter. I recommended for him to continue to monitor the right lower lobe pulmonary nodule closely but the increased metabolic activity in the right upper lobe lesion is concerning for me.  I recommended for the patient to see Dr. Roxan Hockey for consideration of repeat bronchoscopy and to rule out any disease recurrence in that area. We will arrange for the patient to come back for follow-up visit in 6 months for evaluation and repeat CT scan of the chest. He was advised to call immediately if he has any concerning symptoms in the interval.  Disclaimer: This note was dictated with voice recognition software. Similar sounding words can inadvertently be transcribed and may be missed upon review. Eilleen Kempf, MD 10/08/17

## 2017-10-07 ENCOUNTER — Encounter: Payer: Self-pay | Admitting: Internal Medicine

## 2017-10-07 ENCOUNTER — Ambulatory Visit (INDEPENDENT_AMBULATORY_CARE_PROVIDER_SITE_OTHER): Payer: Medicare Other | Admitting: Internal Medicine

## 2017-10-07 VITALS — BP 104/62 | HR 80 | Temp 97.7°F | Resp 16 | Wt 148.0 lb

## 2017-10-07 DIAGNOSIS — R739 Hyperglycemia, unspecified: Secondary | ICD-10-CM | POA: Diagnosis not present

## 2017-10-07 DIAGNOSIS — E785 Hyperlipidemia, unspecified: Secondary | ICD-10-CM | POA: Diagnosis not present

## 2017-10-07 DIAGNOSIS — J449 Chronic obstructive pulmonary disease, unspecified: Secondary | ICD-10-CM

## 2017-10-07 NOTE — Assessment & Plan Note (Signed)
stable overall by history and exam, recent data reviewed with pt, and pt to continue medical treatment as before,  to f/u any worsening symptoms or concerns Lab Results  Component Value Date   HGBA1C 5.2 08/19/2017

## 2017-10-07 NOTE — Assessment & Plan Note (Signed)
Lab Results  Component Value Date   LDLCALC 108 (H) 03/18/2017  stable overall by history and exam, recent data reviewed with pt, and pt to continue medical treatment as before,  to f/u any worsening symptoms or concerns

## 2017-10-07 NOTE — Assessment & Plan Note (Signed)
stable overall by history and exam, and pt to continue medical treatment as before,  to f/u any worsening symptoms or concerns 

## 2017-10-07 NOTE — Patient Instructions (Signed)
Please continue all other medications as before, and refills have been done if requested.  Please have the pharmacy call with any other refills you may need.  Please continue your efforts at being more active, low cholesterol diet, and weight control.  You are otherwise up to date with prevention measures today.  Please keep your appointments with your specialists as you may have planned  Please return in 6 months, or sooner if needed 

## 2017-10-07 NOTE — Progress Notes (Signed)
Subjective:    Patient ID: Raymond Logan, male    DOB: [birth year], 84 y.o.   MRN: [id number]  HPI  Here to f/u; overall doing ok,  Pt denies chest pain, increasing sob or doe, wheezing, orthopnea, PND, increased LE swelling, palpitations, dizziness or syncope.  Pt denies new neurological symptoms such as new headache, or facial or extremity weakness or numbness.  Pt denies polydipsia, polyuria, or low sugar episode.  Pt states overall good compliance with meds, mostly trying to follow appropriate diet, with wt overall stable Wt Readings from Last 3 Encounters:  10/07/17 148 lb (67.1 kg)  10/06/17 148 lb (67.1 kg)  10/03/17 148 lb (67.1 kg)   Past Medical History:  Diagnosis Date  . ALLERGIC RHINITIS 01/23/2007  . Arthritis    hands  . BENIGN PROSTATIC HYPERTROPHY 09/10/2006  . Chronic headaches   . Chronic sinusitis   . Complication of anesthesia    difficulty voiding- if cath needed needs to placed with need to be done with scope  . COPD, MILD 04/03/2010   patient denies on preop visit of 08/02/2014   . Difficulty in urination   . Dyspnea   . FATIGUE 01/08/2008  . GERD 04/15/2010  . H. pylori infection    hx of   . Headache(784.0) 09/10/2006  . HOARSENESS 04/15/2010  . HYPERLIPIDEMIA 09/10/2006  . INSOMNIA-SLEEP DISORDER-UNSPEC 04/24/2009  . Lung cancer ([place])   . PAF (paroxysmal atrial fibrillation) ([place])    identified on EKG 04/08/17  . PEPTIC ULCER DISEASE 01/23/2007  . Pneumonia    1955   Past Surgical History:  Procedure Laterality Date  . CATARACT EXTRACTION Left   . COLONOSCOPY    . CYSTOSCOPY N/A 10/07/2016   Procedure: CYSTOSCOPY;  Surgeon: Festus Aloe, MD;  Location: [facility];  Service: Urology;  Laterality: N/A;  . HEMORRHOID BANDING    . INSERTION OF SUPRAPUBIC CATHETER N/A 10/07/2016   Procedure: FOLEY  CATHETER PLACEMENT;  Surgeon: Festus Aloe, MD;  Location: [facility];  Service: Urology;  Laterality: N/A;  . KNEE ARTHROSCOPY Left    torn meniscus  . LUMBAR  LAMINECTOMY/DECOMPRESSION MICRODISCECTOMY Right 08/07/2014   Procedure: complete DECOMPRESSION LUMBAR LAMINECTOMY/MICRODISCECTOMY OF L4-L5 for spinal stenosis, DECOMPRESSION OF L3-L4;  Surgeon: Latanya Maudlin, MD;  Location: [facility];  Service: Orthopedics;  Laterality: Right;  . NASAL SINUS SURGERY Left 07/04/2017   Procedure: LEFT ENDOSCOPIC SINUS SURGERY;  Surgeon: Jerrell Belfast, MD;  Location: [facility];  Service: ENT;  Laterality: Left;  . RIGHT/LEFT HEART CATH AND CORONARY ANGIOGRAPHY N/A 06/21/2017   Procedure: RIGHT/LEFT HEART CATH AND CORONARY ANGIOGRAPHY;  Surgeon: Belva Crome, MD;  Location: [facility];  Service: Cardiovascular;  Laterality: N/A;  . ROTATOR CUFF REPAIR Right   . SHOULDER ARTHROSCOPY     x3, L & R  . SINUS ENDO WITH FUSION N/A 07/04/2017   Procedure: SINUS ENDO WITH FUSION;  Surgeon: Jerrell Belfast, MD;  Location: [facility];  Service: ENT;  Laterality: N/A;  . VIDEO ASSISTED THORACOSCOPY (VATS)/ LOBECTOMY Right 10/07/2016   Procedure: VIDEO ASSISTED THORACOSCOPY (VATS)/RIGHT MIDDLE LOBECTOMY;  Surgeon: [name], MD;  Location: [facility];  Service: Thoracic;  Laterality: Right;  [name] VIDEO BRONCHOSCOPY WITH ENDOBRONCHIAL NAVIGATION N/A 09/23/2016   Procedure: VIDEO BRONCHOSCOPY WITH ENDOBRONCHIAL NAVIGATION;  Surgeon: [name], MD;  Location: [facility];  Service: Thoracic;  Laterality: N/A;  . VIDEO BRONCHOSCOPY WITH ENDOBRONCHIAL ULTRASOUND N/A 09/23/2016   Procedure: VIDEO BRONCHOSCOPY WITH ENDOBRONCHIAL ULTRASOUND;  Surgeon:  [name], MD;  Location: [facility];  Service: Thoracic;  Laterality: N/A;    reports that he quit smoking about 41 years ago. He has a 23.00 pack-year smoking history. He has never used smokeless tobacco. He reports that he drinks alcohol. He reports that he does not use drugs. family history includes Alzheimer's disease in his mother and sister; Prostate cancer in his brother. Allergies  Allergen Reactions  . Alfuzosin Other  (See Comments) and Hypertension    BP went crazy, dizzy, passing out   . Aricept [Donepezil Hcl] Other (See Comments)    Insomnia, headache  . Penicillins Hives and Other (See Comments)    A PCN REACTION WITH IMMEDIATE RASH, FACIAL/TONGUE/THROAT SWELLING, SOB, OR LIGHTHEADEDNESS WITH HYPOTENSION:  #  #  #  YES  #  #  #   Has patient had a PCN reaction causing severe rash involving mucus membranes or skin necrosis:No Has patient had a PCN reaction that required hospitalization:No Has patient had a PCN reaction occurring within the last 10 years:No If all of the above answers are "NO", then may proceed with Cephalosporin use.    Current Outpatient Medications on File Prior to Visit  Medication Sig Dispense Refill  . albuterol (PROVENTIL HFA;VENTOLIN HFA) 108 (90 Base) MCG/ACT inhaler Inhale 2 puffs into the lungs every 6 (six) hours as needed for wheezing or shortness of breath. 1 Inhaler 11  . apixaban (ELIQUIS) 5 MG TABS tablet Take 1 tablet (5 mg total) by mouth 2 (two) times daily. 180 tablet 3  . benzonatate (TESSALON PERLES) 100 MG capsule Take 1 capsule (100 mg total) by mouth 2 (two) times daily as needed for cough. 60 capsule 2  . calcium carbonate (TUMS - DOSED IN MG ELEMENTAL CALCIUM) 500 MG chewable tablet Chew 1 tablet by mouth 3 (three) times daily as needed for indigestion or heartburn.     . diclofenac sodium (VOLTAREN) 1 % GEL Apply 1 application topically at bedtime.    . finasteride (PROSCAR) 5 MG tablet Take 5 mg by mouth at bedtime.     . fluticasone (FLONASE) 50 MCG/ACT nasal spray Use 2 sprays in each nostril daily as needed for allergies  11  . furosemide (LASIX) 20 MG tablet Take 1 tablet (20 mg total) by mouth daily. 90 tablet 3  . Melatonin 10 MG CAPS Take 10 mg by mouth at bedtime.    . memantine (NAMENDA) 5 MG tablet Take 1 tablet daily for one week, then take 1 tablet twice daily for one week, then take 1 tablet in the morning and 2 in the evening for one week, then  take 2 tablets twice daily 70 tablet 0  . propafenone (RYTHMOL) 225 MG tablet Take 1 tablet (225 mg total) by mouth 2 (two) times daily. 60 tablet 11  . silodosin (RAPAFLO) 8 MG CAPS capsule Take 8 mg by mouth at bedtime.     . traMADol (ULTRAM) 50 MG tablet Take 50 mg by mouth every 6 (six) hours as needed for moderate pain.    [name] zolpidem (AMBIEN) 5 MG tablet Take 5 mg by mouth at bedtime as needed for sleep.     No current facility-administered medications on file prior to visit.    Review of Systems  Constitutional: Negative for other unusual diaphoresis or sweats HENT: Negative for ear discharge or swelling Eyes: Negative for other worsening visual disturbances Respiratory: Negative for stridor or other swelling  Gastrointestinal: Negative for worsening distension or other blood  Genitourinary: Negative for retention or other urinary change Musculoskeletal: Negative for other MSK pain or swelling Skin: Negative for color change or other new lesions Neurological: Negative for worsening tremors and other numbness  Psychiatric/Behavioral: Negative for worsening agitation or other fatigue All other system neg per pt    Objective:   Physical Exam Blood pressure 104/62, pulse 80, temperature 97.7 F (36.5 C), temperature source Oral, resp. rate 16, weight 148 lb (67.1 kg), SpO2 98 %. VS noted,  Constitutional: Pt appears in NAD HENT: Head: NCAT.  Right Ear: External ear normal.  Left Ear: External ear normal.  Eyes: . Pupils are equal, round, and reactive to light. Conjunctivae and EOM are normal Nose: without d/c or deformity Neck: Neck supple. Gross normal ROM Cardiovascular: Normal rate and regular rhythm.   Pulmonary/Chest: Effort normal and breath sounds decreased without rales or wheezing.  Neurological: Pt is alert. At baseline orientation, motor grossly intact Skin: Skin is warm. No rashes, other new lesions, no LE edema Psychiatric: Pt behavior is normal without agitation    No other exam findings Lab Results  Component Value Date   WBC 4.9 08/30/2017   HGB 11.9 (L) 08/30/2017   HCT 35.9 (L) 08/30/2017   PLT 243 08/30/2017   GLUCOSE 91 08/30/2017   CHOL 169 03/18/2017   TRIG 122.0 03/18/2017   HDL 35.90 (L) 03/18/2017   LDLDIRECT 134.4 03/27/2010   LDLCALC 108 (H) 03/18/2017   ALT 9 08/30/2017   AST 13 (L) 08/30/2017   NA 139 08/30/2017   K 3.9 08/30/2017   CL 102 08/30/2017   CREATININE 1.05 08/30/2017   BUN 16 08/30/2017   CO2 31 08/30/2017   TSH 0.47 03/18/2017   PSA 1.91 03/18/2017   INR 1.12 07/04/2017   HGBA1C 5.2 08/19/2017      Assessment & Plan:

## 2017-10-11 ENCOUNTER — Encounter: Payer: Self-pay | Admitting: Nurse Practitioner

## 2017-10-18 ENCOUNTER — Ambulatory Visit (INDEPENDENT_AMBULATORY_CARE_PROVIDER_SITE_OTHER): Payer: Medicare Other | Admitting: Thoracic Surgery (Cardiothoracic Vascular Surgery)

## 2017-10-18 VITALS — BP 98/65 | HR 100 | Resp 20 | Ht 68.0 in | Wt 148.0 lb

## 2017-10-18 DIAGNOSIS — C342 Malignant neoplasm of middle lobe, bronchus or lung: Secondary | ICD-10-CM | POA: Diagnosis not present

## 2017-10-18 NOTE — Progress Notes (Signed)
[address]       [place] [address]             [phone]      HPI: Raymond Logan returns for consideration for biopsy  Raymond Logan is an 84 year old man who had a right middle lobectomy year ago for a stage IA (T1, N0) adenocarcinoma.  His vascular margin was close so he was treated with postoperative adjuvant radiation.  I saw him in July after he had a CT for follow-up.  He had a 17 mm groundglass opacity in the right lower lobe.  There was extensive soft tissue density in the right hilum thought to represent radiation change.  In the interim since then he had a PET/CT.  There was minimal activity associated with the right lower lobe nodule.  There was some significant hypermetabolic activity in the right hilum.  Radiology favored that this was inflammation/radiation change, but cannot rule out the possibility of recurrence.  He saw Dr. Julien Nordmann and is now referred back for consideration for biopsy.  Raymond Logan is feeling pretty well today.  He coming by his daughter he says this is the best he is been in the past year.  They are very reluctant to consider a biopsy at the present time.  Past Medical History:  Diagnosis Date  . ALLERGIC RHINITIS 01/23/2007  . Arthritis    hands  . BENIGN PROSTATIC HYPERTROPHY 09/10/2006  . Chronic headaches   . Chronic sinusitis   . Complication of anesthesia    difficulty voiding- if cath needed needs to placed with need to be done with scope  . COPD, MILD 04/03/2010   patient denies on preop visit of 08/02/2014   . Difficulty in urination   . Dyspnea   . FATIGUE 01/08/2008  . GERD 04/15/2010  . H. pylori infection    hx of   . Headache(784.0) 09/10/2006  . HOARSENESS 04/15/2010  . HYPERLIPIDEMIA 09/10/2006  . INSOMNIA-SLEEP DISORDER-UNSPEC 04/24/2009  . Lung cancer ([place])   . PAF (paroxysmal atrial fibrillation) ([facility])    identified on EKG 04/08/17  . PEPTIC ULCER DISEASE 01/23/2007  . Pneumonia    1955    Current  Outpatient Medications  Medication Sig Dispense Refill  . albuterol (PROVENTIL HFA;VENTOLIN HFA) 108 (90 Base) MCG/ACT inhaler Inhale 2 puffs into the lungs every 6 (six) hours as needed for wheezing or shortness of breath. 1 Inhaler 11  . apixaban (ELIQUIS) 5 MG TABS tablet Take 1 tablet (5 mg total) by mouth 2 (two) times daily. 180 tablet 3  . calcium carbonate (TUMS - DOSED IN MG ELEMENTAL CALCIUM) 500 MG chewable tablet Chew 1 tablet by mouth 3 (three) times daily as needed for indigestion or heartburn.     . diclofenac sodium (VOLTAREN) 1 % GEL Apply 1 application topically at bedtime.    . finasteride (PROSCAR) 5 MG tablet Take 5 mg by mouth at bedtime.     . fluticasone (FLONASE) 50 MCG/ACT nasal spray Use 2 sprays in each nostril daily as needed for allergies  11  . furosemide (LASIX) 20 MG tablet Take 1 tablet (20 mg total) by mouth daily. 90 tablet 3  . memantine (NAMENDA) 5 MG tablet Take 1 tablet daily for one week, then take 1 tablet twice daily for one week, then take 1 tablet in the morning and 2 in the evening for one week, then take 2 tablets twice daily 70 tablet 0  . propafenone (RYTHMOL)  225 MG tablet Take 1 tablet (225 mg total) by mouth 2 (two) times daily. 60 tablet 11  . silodosin (RAPAFLO) 8 MG CAPS capsule Take 8 mg by mouth at bedtime.     . traMADol (ULTRAM) 50 MG tablet Take 50 mg by mouth every 6 (six) hours as needed for moderate pain.    [name] zolpidem (AMBIEN) 5 MG tablet Take 5 mg by mouth at bedtime as needed for sleep.     No current facility-administered medications for this visit.     Physical Exam BP 98/65   Pulse 100   Resp 20   Ht 5\' 8"  (1.727 m)   Wt 148 lb (67.1 kg)   SpO2 97% Comment: RA  BMI 22.55 kg/m  84 year old man in no acute distress Alert and oriented x3 with no focal deficits No cervical or supraclavicular adenopathy Lungs clear  Diagnostic Tests: NUCLEAR MEDICINE PET SKULL BASE TO THIGH  TECHNIQUE: 7.4 mCi F-18 FDG was injected  intravenously. Full-ring PET imaging was performed from the skull base to thigh after the radiotracer. CT data was obtained and used for attenuation correction and anatomic localization.  Fasting blood glucose: 103 mg/dl  COMPARISON:  None.  FINDINGS: Mediastinal blood pool activity: SUV max 2.35  NECK: No hypermetabolic lymph nodes in the neck.  Incidental CT findings: none  CHEST: The new ground-glass nodule concern in the RIGHT lower lobe measures 15 mm x 13 mm and has mild associated metabolic activity SUV max equal 1.6.  There is intense activity associated with the RIGHT perihilar consolidation and atelectasis at the surgical margin with SUV max equal 6.8. There is bronchiectasis within this consolidation. The CT findings are typical of postradiation change  Within atelectasis anteromedially along the mediastinal border is a small loculated focus of gas measuring 8 mm (image 71/4) which is adjacent to suture line (image 73/4.  There is a moderateRIGHT effusion similar prior  LEFT lung is clear except for a nodule along the oblique fissure measuring 4 mm (image 75/4) not changed from prior.  Intense hypermetabolic activity associated small LEFT hilar lymph node again present. SUV max equal 7.5 increased from 4.9.  Incidental CT findings: none  ABDOMEN/PELVIS: No abnormal hypermetabolic activity within the liver, pancreas, adrenal glands, or spleen. No hypermetabolic lymph nodes in the abdomen or pelvis.  Incidental CT findings: Enlarged prostate gland.  SKELETON: No focal hypermetabolic activity to suggest skeletal metastasis.  Incidental CT findings: none  IMPRESSION: 1. Ground-glass nodule RIGHT lower lobe is suspicious for adenocarcinoma in situ with suspicious morphology and mild metabolic activity. 2. Intense metabolic activity about the RIGHT hilum is favored post radiation/postsurgery inflammatory change. The activity is  more intense than typical therefore recommend follow-up imaging. 3. Small locule of gas anteriorly in the atelectatic lung against the mediastinal pleural surface. Recommend attention on follow-up to exclude a small bronchopleural fistula. 4. Persistent LEFT hilar hypermetabolic lymph node. Favor reactive node. Recommend attention on follow-up.   Electronically Signed   By: Suzy Bouchard M.D.   On: 10/04/2017 16:02  Impression: Raymond Logan is an 84 year old man who had a right middle lobectomy for a stage IA non-small cell carcinoma a year ago.  Because of a close vascular margin he underwent postoperative adjuvant radiation.  He now has a new 17 mm groundglass opacity in the right lower lobe.  That is suspicious for a new low-grade adenocarcinoma in situ.  There is extensive soft tissue change in the postoperative area/right hilum.  It is likely  a combination of surgical scarring and radiation change.  There is a possibility there could be tumor recurrence.  There is no specific site to target for biopsy.  I discussed the possibility of doing a bronchoscopic biopsy with Raymond Logan daughter.  I recommended that we proceed with that but with the caveat that there was not a specific lesion to target in the hilar area so a negative biopsy would not necessarily rule out the possibility of recurrence.  He really does not want have a biopsy and she favors a more conservative approach as well.  After a discussion they wish to have interval shorter-term follow-up in 3 months rather than have a biopsy at this time.  Plan: Return in 3 months with CT chest.  Will use super D protocol in case bronchoscopy is indicated and he is agreeable.  [name], MD [facility] [phone]

## 2017-10-19 ENCOUNTER — Other Ambulatory Visit: Payer: Medicare Other | Admitting: Licensed Clinical Social Worker

## 2017-10-19 ENCOUNTER — Other Ambulatory Visit: Payer: Medicare Other | Admitting: *Deleted

## 2017-10-19 DIAGNOSIS — Z515 Encounter for palliative care: Secondary | ICD-10-CM

## 2017-10-20 NOTE — Progress Notes (Signed)
COMMUNITY PALLIATIVE CARE SW NOTE  PATIENT NAME: Raymond Logan DOB: October 25, 1930 MRN: [id number]  PRIMARY CARE PROVIDER: Biagio Borg, MD  RESPONSIBLE PARTY:  Acct ID - Guarantor Home Phone Work Phone Relationship Acct Type  [id number] [name]  Self P/F     [address], [place], [place] [address]     PLAN OF CARE and INTERVENTIONS:             1. GOALS OF CARE/ ADVANCE CARE PLANNING:  Patient wants to remain in his home.  SW delivered his MOST form. 2. SOCIAL/EMOTIONAL/SPIRITUAL ASSESSMENT/ INTERVENTIONS:  SW and Palliative Care RN, Daryl Eastern, met with patient in his home.  He stated several times that he was exhausted from playing table tennis this morning.  He also reported his MD informed him they would reevaluate his lung in three months.  SW provided active listening and supportive counseling while he discussed his feelings about the possibility of cancer.  He appears to be adjusting well to the implications of possible treatment.  He confirms he does not want to have lung cancer again.  Patient is having to grocery shop and prepare meals since he wife has been sick. 3. PATIENT/CAREGIVER EDUCATION/ COPING:  Patient copes by expressing his thoughts openly.  SW provided education regarding services that deliver meals.  He said he understood. 4. PERSONAL EMERGENCY PLAN:  Patient will rest when her is fatigued. 5. COMMUNITY RESOURCES COORDINATION/ HEALTH CARE NAVIGATION:  None 6. FINANCIAL/LEGAL CONCERNS/INTERVENTIONS:  None     SOCIAL HX:  Social History   Tobacco Use  . Smoking status: Former Smoker    Packs/day: 1.00    Years: 23.00    Pack years: 23.00    Last attempt to quit: 1978    Years since quitting: 41.6  . Smokeless tobacco: Never Used  Substance Use Topics  . Alcohol use: Yes    Comment: rare    CODE STATUS:  Full Code  ADVANCED DIRECTIVES: N MOST FORM COMPLETE:  Y HOSPICE EDUCATION PROVIDED:  N PPS:  Patient's appetite is normal.  He  stand and ambulates independently. Duration of visit and documentation:  75 minutes.      Creola Corn Travaris Kosh, LCSW

## 2017-10-23 ENCOUNTER — Other Ambulatory Visit: Payer: Self-pay | Admitting: Neurology

## 2017-10-26 NOTE — Progress Notes (Signed)
COMMUNITY PALLIATIVE CARE RN NOTE  PATIENT NAME: Raymond Logan DOB: [birth year] MRN: [id number]  PRIMARY CARE PROVIDER: Biagio Borg, MD  RESPONSIBLE PARTY:  Acct ID - Guarantor Home Phone Work Phone Relationship Acct Type  [id number] [name]  Self P/F     [address], [place], [place] [address]    PLAN OF CARE and INTERVENTION:  1. ADVANCE CARE PLANNING/GOALS OF CARE: Remain in his home, continue to play table tennis, avoid hospitalizations 2. PATIENT/CAREGIVER EDUCATION: Reinforced Safety Precautions 3. DISEASE STATUS: Joint visit made with [facility]. Patient answered the door upon arrival. Very pleasant and engaging. Denies pain. He had a recent PET scan on 8/6. Reports that he saw his Oncologist yesterday, and that it is recommended that they continue to monitor nodule in lung to see whether it grows or not before starting a treatment plan. He says that he is not worried about what may happen and he will continue to live his life one day at a time. Earlier today, he played table tennis at the [employer] and states that he was able to play about 7-8 games. He continues to drive and perform ADLs independently. He is concerned about his wife, as she is having more days of not feeling well and spends most of her time in bed, which leaves grocery shopping and meal preparation for him to do. He appears thinner overall and not taking nutritional supplements as often as he did in the past. He is now having to wear a belt daily. Continues to require frequent rest periods. Mild dyspnea noted with exertion, however he is able to recover quickly at rest. Non productive dry cough. Continues to utilize throat lozenges frequently throughout the day to help. Will continue to monitor.  HISTORY OF PRESENT ILLNESS:  This is a 84 yo male who resides with his wife. Palliative Care continues to follow patient to assess overall condition and assist with symptom management  needs. Will set up next visit in 1 month.  CODE STATUS: FULL CODE ADVANCED DIRECTIVES: Y MOST FORM: yes PPS: 60%   PHYSICAL EXAM:   VITALS: Today's Vitals   10/19/17 1412  BP: 100/68  Pulse: 84  Resp: 18  SpO2: 97%  PainSc: 0-No pain    LUNGS: clear to auscultation  CARDIAC: Cor RRR EXTREMITIES: No edema SKIN: Exposed skin dry and intact  NEURO: Alert and oriented x 3, pleasant mood, forgetful, ambulatory   (Duration of visit and documentation 75 minutes)    Daryl Eastern, RN, BSN

## 2017-10-30 ENCOUNTER — Other Ambulatory Visit: Payer: Self-pay | Admitting: Neurology

## 2017-11-01 NOTE — Telephone Encounter (Signed)
I called pt and LVM for him to call. Want to see how he is tolerating medication. He should now be on 2 tabs twice daily. If he is, we can call in new rx for this maintenance dose.

## 2017-11-02 ENCOUNTER — Encounter: Payer: Self-pay | Admitting: Internal Medicine

## 2017-11-02 ENCOUNTER — Ambulatory Visit (INDEPENDENT_AMBULATORY_CARE_PROVIDER_SITE_OTHER): Payer: Medicare Other | Admitting: Internal Medicine

## 2017-11-02 VITALS — BP 114/64 | HR 87 | Ht 68.0 in | Wt 148.0 lb

## 2017-11-02 DIAGNOSIS — I480 Paroxysmal atrial fibrillation: Secondary | ICD-10-CM

## 2017-11-02 DIAGNOSIS — R0609 Other forms of dyspnea: Secondary | ICD-10-CM | POA: Diagnosis not present

## 2017-11-02 NOTE — Patient Instructions (Signed)

## 2017-11-02 NOTE — Telephone Encounter (Signed)
I called pt again. He was unsure what dose of memantine he is on. States his daughter [name] on [address] does his pill box for him. Wants me to call her to verify at [phone]. Advised  I will call her to find out. He verbalized understanding.   I called [name]. She verified he is on memantine but he lost pill box and lost some pills. She was unsure where he was in titration so she had him do another week of the 2 in the am and 1 in the pm.  He is almost finished with this. He is tolerating well, no SE. She wants maintenance dose of 2 tablets in the am and 2 tablets in pm called in. Advised I will send request to Dr. Jannifer Franklin. She verbalized understanding.

## 2017-11-02 NOTE — Telephone Encounter (Signed)
A prescription for the Namenda was sent in.

## 2017-11-02 NOTE — Progress Notes (Signed)
HPI Mr. Rajan returns today for followup of PAF, chronic diastolic heart failure, and prior lung CA. He has seen Dr. Roxan Hockey and his repeat biopsy is pending. He was noted to have a hypermetabolic lesion in his chest, possibly due to recurrent CA vs XRT induced inflammation. He still gets sob with exertion.  Allergies  Allergen Reactions  . Alfuzosin Other (See Comments) and Hypertension    BP went crazy, dizzy, passing out   . Aricept [Donepezil Hcl] Other (See Comments)    Insomnia, headache  . Penicillins Hives and Other (See Comments)    A PCN REACTION WITH IMMEDIATE RASH, FACIAL/TONGUE/THROAT SWELLING, SOB, OR LIGHTHEADEDNESS WITH HYPOTENSION:  #  #  #  YES  #  #  #   Has patient had a PCN reaction causing severe rash involving mucus membranes or skin necrosis:No Has patient had a PCN reaction that required hospitalization:No Has patient had a PCN reaction occurring within the last 10 years:No If all of the above answers are "NO", then may proceed with Cephalosporin use.      Current Outpatient Medications  Medication Sig Dispense Refill  . albuterol (PROVENTIL HFA;VENTOLIN HFA) 108 (90 Base) MCG/ACT inhaler Inhale 2 puffs into the lungs every 6 (six) hours as needed for wheezing or shortness of breath. 1 Inhaler 11  . apixaban (ELIQUIS) 5 MG TABS tablet Take 1 tablet (5 mg total) by mouth 2 (two) times daily. 180 tablet 3  . calcium carbonate (TUMS - DOSED IN MG ELEMENTAL CALCIUM) 500 MG chewable tablet Chew 1 tablet by mouth 3 (three) times daily as needed for indigestion or heartburn.     . diclofenac sodium (VOLTAREN) 1 % GEL Apply 1 application topically at bedtime.    . finasteride (PROSCAR) 5 MG tablet Take 5 mg by mouth at bedtime.     . fluticasone (FLONASE) 50 MCG/ACT nasal spray Use 2 sprays in each nostril daily as needed for allergies  11  . furosemide (LASIX) 20 MG tablet Take 1 tablet (20 mg total) by mouth daily. 90 tablet 3  . memantine (NAMENDA) 5 MG  tablet Take 1 tablet daily for one week, then take 1 tablet twice daily for one week, then take 1 tablet in the morning and 2 in the evening for one week, then take 2 tablets twice daily 70 tablet 0  . propafenone (RYTHMOL) 225 MG tablet Take 1 tablet (225 mg total) by mouth 2 (two) times daily. 60 tablet 11  . silodosin (RAPAFLO) 8 MG CAPS capsule Take 8 mg by mouth at bedtime.     . traMADol (ULTRAM) 50 MG tablet Take 50 mg by mouth every 6 (six) hours as needed for moderate pain.    [name] zolpidem (AMBIEN) 5 MG tablet Take 5 mg by mouth at bedtime as needed for sleep.     No current facility-administered medications for this visit.      Past Medical History:  Diagnosis Date  . ALLERGIC RHINITIS 01/23/2007  . Arthritis    hands  . BENIGN PROSTATIC HYPERTROPHY 09/10/2006  . Chronic headaches   . Chronic sinusitis   . Complication of anesthesia    difficulty voiding- if cath needed needs to placed with need to be done with scope  . COPD, MILD 04/03/2010   patient denies on preop visit of 08/02/2014   . Difficulty in urination   . Dyspnea   . FATIGUE 01/08/2008  . GERD 04/15/2010  . H. pylori infection  hx of   . Headache(784.0) 09/10/2006  . HOARSENESS 04/15/2010  . HYPERLIPIDEMIA 09/10/2006  . INSOMNIA-SLEEP DISORDER-UNSPEC 04/24/2009  . Lung cancer ([place])   . PAF (paroxysmal atrial fibrillation) ([place])    identified on EKG 04/08/17  . PEPTIC ULCER DISEASE 01/23/2007  . Pneumonia    1955    ROS:   All systems reviewed and negative except as noted in the HPI.   Past Surgical History:  Procedure Laterality Date  . CATARACT EXTRACTION Left   . COLONOSCOPY    . CYSTOSCOPY N/A 10/07/2016   Procedure: CYSTOSCOPY;  Surgeon: Festus Aloe, MD;  Location: [facility];  Service: Urology;  Laterality: N/A;  . HEMORRHOID BANDING    . INSERTION OF SUPRAPUBIC CATHETER N/A 10/07/2016   Procedure: FOLEY  CATHETER PLACEMENT;  Surgeon: Festus Aloe, MD;  Location: [facility];  Service: Urology;   Laterality: N/A;  . KNEE ARTHROSCOPY Left    torn meniscus  . LUMBAR LAMINECTOMY/DECOMPRESSION MICRODISCECTOMY Right 08/07/2014   Procedure: complete DECOMPRESSION LUMBAR LAMINECTOMY/MICRODISCECTOMY OF L4-L5 for spinal stenosis, DECOMPRESSION OF L3-L4;  Surgeon: Latanya Maudlin, MD;  [facility];  Service: Orthopedics;  Laterality: Right;  . NASAL SINUS SURGERY Left 07/04/2017   Procedure: LEFT ENDOSCOPIC SINUS SURGERY;  Surgeon: Jerrell Belfast, MD;  Location: [facility];  Service: ENT;  Laterality: Left;  . RIGHT/LEFT HEART CATH AND CORONARY ANGIOGRAPHY N/A 06/21/2017   Procedure: RIGHT/LEFT HEART CATH AND CORONARY ANGIOGRAPHY;  Surgeon: Belva Crome, MD;  Location: [facility];  Service: Cardiovascular;  Laterality: N/A;  . ROTATOR CUFF REPAIR Right   . SHOULDER ARTHROSCOPY     x3, L & R  . SINUS ENDO WITH FUSION N/A 07/04/2017   Procedure: SINUS ENDO WITH FUSION;  Surgeon: Jerrell Belfast, MD;  Location: [facility];  Service: ENT;  Laterality: N/A;  . VIDEO ASSISTED THORACOSCOPY (VATS)/ LOBECTOMY Right 10/07/2016   Procedure: VIDEO ASSISTED THORACOSCOPY (VATS)/RIGHT MIDDLE LOBECTOMY;  Surgeon: [name], MD;  Location: [facility];  Service: Thoracic;  Laterality: Right;  [name] VIDEO BRONCHOSCOPY WITH ENDOBRONCHIAL NAVIGATION N/A 09/23/2016   Procedure: VIDEO BRONCHOSCOPY WITH ENDOBRONCHIAL NAVIGATION;  Surgeon: [name], MD;  Location: [facility];  Service: Thoracic;  Laterality: N/A;  . VIDEO BRONCHOSCOPY WITH ENDOBRONCHIAL ULTRASOUND N/A 09/23/2016   Procedure: VIDEO BRONCHOSCOPY WITH ENDOBRONCHIAL ULTRASOUND;  Surgeon: [name], MD;  [facility];  Service: Thoracic;  Laterality: N/A;     Family History  Problem Relation Age of Onset  . Prostate cancer Brother   . Alzheimer's disease Mother   . Alzheimer's disease Sister      Social History   Socioeconomic History  . Marital status: Married    Spouse name: Not on file  . Number of children: 3  . Years  of education: Not on file  . Highest education level: Not on file  Occupational History  . Occupation: Retired    Fish farm manager: RETIRED  Social Needs  . Financial resource strain: Not on file  . Food insecurity:    Worry: Not on file    Inability: Not on file  . Transportation needs:    Medical: Not on file    Non-medical: Not on file  Tobacco Use  . Smoking status: Former Smoker    Packs/day: 1.00    Years: 23.00    Pack years: 23.00    Last attempt to quit: 1978    Years since quitting: 41.7  . Smokeless tobacco: Never Used  Substance and Sexual Activity  .  Alcohol use: Yes    Comment: rare  . Drug use: No  . Sexual activity: Not on file  Lifestyle  . Physical activity:    Days per week: Not on file    Minutes per session: Not on file  . Stress: Not on file  Relationships  . Social connections:    Talks on phone: Not on file    Gets together: Not on file    Attends religious service: Not on file    Active member of club or organization: Not on file    Attends meetings of clubs or organizations: Not on file    Relationship status: Not on file  . Intimate partner violence:    Fear of current or ex partner: Not on file    Emotionally abused: Not on file    Physically abused: Not on file    Forced sexual activity: Not on file  Other Topics Concern  . Not on file  Social History Narrative  . Not on file     BP 114/64   Pulse 87   Ht 5\' 8"  (1.727 m)   Wt 148 lb (67.1 kg)   SpO2 97%   BMI 22.50 kg/m   Physical Exam:  Well appearing NAD HEENT: Unremarkable Neck:  No JVD, no thyromegally Lymphatics:  No adenopathy Back:  No CVA tenderness Lungs:  Clear with no wheezes HEART:  Regular rate rhythm, no murmurs, no rubs, no clicks Abd:  soft, positive bowel sounds, no organomegally, no rebound, no guarding Ext:  2 plus pulses, no edema, no cyanosis, no clubbing Skin:  No rashes no nodules Neuro:  CN II through XII intact, motor grossly intact  EKG -  NSR   Assess/Plan: 1. PAF - he is maintaining NSR on propafenone. He will continue his current meds. 2. Dyspnea - today we walked in the halls on pulse oximetry. He did not drop his oxygen saturation and his HR increased appropriately. 3. Abnormal stress test - he has undergone cardiac cath demonstrating no CAD.   Mikle Bosworth.D.

## 2017-11-03 ENCOUNTER — Other Ambulatory Visit: Payer: Self-pay | Admitting: *Deleted

## 2017-11-03 MED ORDER — MEMANTINE HCL 10 MG PO TABS: ORAL_TABLET | ORAL | 5 refills | Status: DC

## 2017-11-06 DIAGNOSIS — J22 Unspecified acute lower respiratory infection: Secondary | ICD-10-CM | POA: Diagnosis not present

## 2017-11-09 ENCOUNTER — Ambulatory Visit (INDEPENDENT_AMBULATORY_CARE_PROVIDER_SITE_OTHER)
Admission: RE | Admit: 2017-11-09 | Discharge: 2017-11-09 | Disposition: A | Discharge status: Home / Self Care | Payer: Medicare Other | Source: Ambulatory Visit | Attending: Internal Medicine | Admitting: Internal Medicine

## 2017-11-09 ENCOUNTER — Ambulatory Visit (INDEPENDENT_AMBULATORY_CARE_PROVIDER_SITE_OTHER): Payer: Medicare Other | Admitting: Internal Medicine

## 2017-11-09 ENCOUNTER — Encounter: Payer: Self-pay | Admitting: Internal Medicine

## 2017-11-09 ENCOUNTER — Other Ambulatory Visit (INDEPENDENT_AMBULATORY_CARE_PROVIDER_SITE_OTHER): Payer: Medicare Other

## 2017-11-09 VITALS — BP 102/60 | HR 97 | Temp 98.5°F | Ht 68.0 in | Wt 140.0 lb

## 2017-11-09 DIAGNOSIS — R05 Cough: Secondary | ICD-10-CM

## 2017-11-09 DIAGNOSIS — Z23 Encounter for immunization: Secondary | ICD-10-CM | POA: Diagnosis not present

## 2017-11-09 DIAGNOSIS — R5383 Other fatigue: Secondary | ICD-10-CM

## 2017-11-09 DIAGNOSIS — J449 Chronic obstructive pulmonary disease, unspecified: Secondary | ICD-10-CM | POA: Diagnosis not present

## 2017-11-09 DIAGNOSIS — R0602 Shortness of breath: Secondary | ICD-10-CM | POA: Diagnosis not present

## 2017-11-09 DIAGNOSIS — R059 Cough, unspecified: Secondary | ICD-10-CM

## 2017-11-09 LAB — CBC WITH DIFFERENTIAL/PLATELET
BASOS ABS: 0 10*3/uL (ref 0.0–0.1)
Basophils Relative: 0.7 % (ref 0.0–3.0)
EOS ABS: 0.1 10*3/uL (ref 0.0–0.7)
Eosinophils Relative: 1.1 % (ref 0.0–5.0)
HCT: 34.5 % — ABNORMAL LOW (ref 39.0–52.0)
Hemoglobin: 11.7 g/dL — ABNORMAL LOW (ref 13.0–17.0)
LYMPHS ABS: 0.8 10*3/uL (ref 0.7–4.0)
LYMPHS PCT: 15.7 % (ref 12.0–46.0)
MCHC: 33.9 g/dL (ref 30.0–36.0)
MCV: 87.3 fl (ref 78.0–100.0)
Monocytes Absolute: 0.5 10*3/uL (ref 0.1–1.0)
Monocytes Relative: 10.7 % (ref 3.0–12.0)
NEUTROS ABS: 3.5 10*3/uL (ref 1.4–7.7)
NEUTROS PCT: 71.8 % (ref 43.0–77.0)
PLATELETS: 311 10*3/uL (ref 150.0–400.0)
RBC: 3.95 Mil/uL — ABNORMAL LOW (ref 4.22–5.81)
RDW: 13.3 % (ref 11.5–15.5)
WBC: 4.9 10*3/uL (ref 4.0–10.5)

## 2017-11-09 LAB — URINALYSIS, ROUTINE W REFLEX MICROSCOPIC
Bilirubin Urine: NEGATIVE
Ketones, ur: NEGATIVE
Nitrite: NEGATIVE
PH: 5.5 (ref 5.0–8.0)
Specific Gravity, Urine: 1.025 (ref 1.000–1.030)
TOTAL PROTEIN, URINE-UPE24: NEGATIVE
URINE GLUCOSE: NEGATIVE
Urobilinogen, UA: 0.2 (ref 0.0–1.0)

## 2017-11-09 LAB — HEPATIC FUNCTION PANEL
ALK PHOS: 99 U/L (ref 39–117)
ALT: 8 U/L (ref 0–53)
AST: 11 U/L (ref 0–37)
Albumin: 3.6 g/dL (ref 3.5–5.2)
BILIRUBIN DIRECT: 0.1 mg/dL (ref 0.0–0.3)
Total Bilirubin: 0.3 mg/dL (ref 0.2–1.2)
Total Protein: 7.2 g/dL (ref 6.0–8.3)

## 2017-11-09 LAB — BASIC METABOLIC PANEL
BUN: 20 mg/dL (ref 6–23)
CALCIUM: 8.9 mg/dL (ref 8.4–10.5)
CO2: 30 mEq/L (ref 19–32)
Chloride: 102 mEq/L (ref 96–112)
Creatinine, Ser: 0.94 mg/dL (ref 0.40–1.50)
GFR: 81.05 mL/min (ref >60.00)
GLUCOSE: 126 mg/dL — AB (ref 70–99)
Potassium: 3.8 mEq/L (ref 3.5–5.1)
Sodium: 137 mEq/L (ref 135–145)

## 2017-11-09 LAB — TSH: TSH: 0.92 u[IU]/mL (ref 0.35–4.50)

## 2017-11-09 MED ORDER — PREDNISONE 10 MG PO TABS
ORAL_TABLET | ORAL | 0 refills | Status: DC
Start: 2017-11-09 — End: 2017-12-12

## 2017-11-09 MED ORDER — HYDROCODONE-HOMATROPINE 5-1.5 MG/5ML PO SYRP: 5.0000 mL | ORAL_SOLUTION | Freq: Four times a day (QID) | ORAL | 0 refills | Status: AC | PRN

## 2017-11-09 MED ORDER — ALBUTEROL SULFATE HFA 108 (90 BASE) MCG/ACT IN AERS: 2.0000 | INHALATION_SPRAY | Freq: Four times a day (QID) | RESPIRATORY_TRACT | 11 refills | Status: DC | PRN

## 2017-11-09 NOTE — Assessment & Plan Note (Signed)
Suspect related to acute illness as well as possible worsening malignancy, has oncology f/u planned, for labs today as ordered

## 2017-11-09 NOTE — Assessment & Plan Note (Signed)
C/w possible viral vs other bronchitis, to continue biaxin course, add cough med prn, predpac asd, inhaler prn, and for cxr today

## 2017-11-09 NOTE — Assessment & Plan Note (Signed)
With very mild exacerbation - for predpac asd, inhaler prn

## 2017-11-09 NOTE — Progress Notes (Signed)
Subjective:    Patient ID: Raymond Logan, male    DOB: 1930/12/29, 84 y.o.   MRN: [id number]  HPI  Here with recent onset scant prod cough x 5 days but so frequent he has been increasingly sob when he cant stop coughing, may have had some wheezing, but no high fever and Pt denies chest pain, orthopnea, PND, increased LE swelling, palpitations, dizziness or syncope.  Pt denies new neurological symptoms such as new headache, or facial or extremity weakness or numbness   Pt denies polydipsia, polyuria.  Was seen at [facility] 3 days ago and has been taking clarithromycin and tessalon perle, just not improved.   Nothing else makes better or worse.  Also mentions overall stamina getting worse, could only play one game at the senior center when he has often done 10 in the past in one day.  Did have an abnormal PET scan aug 2019.   Has lost wt recently with lower appetite and "eats like a bird." Wt Readings from Last 3 Encounters:  11/09/17 140 lb (63.5 kg)  11/02/17 148 lb (67.1 kg)  10/18/17 148 lb (67.1 kg)   Past Medical History:  Diagnosis Date  . ALLERGIC RHINITIS 01/23/2007  . Arthritis    hands  . BENIGN PROSTATIC HYPERTROPHY 09/10/2006  . Chronic headaches   . Chronic sinusitis   . Complication of anesthesia    difficulty voiding- if cath needed needs to placed with need to be done with scope  . COPD, MILD 04/03/2010   patient denies on preop visit of 08/02/2014   . Difficulty in urination   . Dyspnea   . FATIGUE 01/08/2008  . GERD 04/15/2010  . H. pylori infection    hx of   . Headache(784.0) 09/10/2006  . HOARSENESS 04/15/2010  . HYPERLIPIDEMIA 09/10/2006  . INSOMNIA-SLEEP DISORDER-UNSPEC 04/24/2009  . Lung cancer ([place])   . PAF (paroxysmal atrial fibrillation) ([facility])    identified on EKG 04/08/17  . PEPTIC ULCER DISEASE 01/23/2007  . Pneumonia    1955   Past Surgical History:  Procedure Laterality Date  . CATARACT EXTRACTION Left   . COLONOSCOPY    . CYSTOSCOPY N/A 10/07/2016   Procedure:  CYSTOSCOPY;  Surgeon: Festus Aloe, MD;  Location: [facility];  Service: Urology;  Laterality: N/A;  . HEMORRHOID BANDING    . INSERTION OF SUPRAPUBIC CATHETER N/A 10/07/2016   Procedure: FOLEY  CATHETER PLACEMENT;  Surgeon: Festus Aloe, MD;  Location: [facility];  Service: Urology;  Laterality: N/A;  . KNEE ARTHROSCOPY Left    torn meniscus  . LUMBAR LAMINECTOMY/DECOMPRESSION MICRODISCECTOMY Right 08/07/2014   Procedure: complete DECOMPRESSION LUMBAR LAMINECTOMY/MICRODISCECTOMY OF L4-L5 for spinal stenosis, DECOMPRESSION OF L3-L4;  Surgeon: Latanya Maudlin, MD;  [facility];  Service: Orthopedics;  Laterality: Right;  . NASAL SINUS SURGERY Left 07/04/2017   Procedure: LEFT ENDOSCOPIC SINUS SURGERY;  Surgeon: Jerrell Belfast, MD;  Location: [facility];  Service: ENT;  Laterality: Left;  . RIGHT/LEFT HEART CATH AND CORONARY ANGIOGRAPHY N/A 06/21/2017   Procedure: RIGHT/LEFT HEART CATH AND CORONARY ANGIOGRAPHY;  Surgeon: Belva Crome, MD;  Location: [facility];  Service: Cardiovascular;  Laterality: N/A;  . ROTATOR CUFF REPAIR Right   . SHOULDER ARTHROSCOPY     x3, L & R  . SINUS ENDO WITH FUSION N/A 07/04/2017   Procedure: SINUS ENDO WITH FUSION;  Surgeon: Jerrell Belfast, MD;  Location: [facility];  Service: ENT;  Laterality: N/A;  . VIDEO ASSISTED THORACOSCOPY (VATS)/ LOBECTOMY Right  10/07/2016   Procedure: VIDEO ASSISTED THORACOSCOPY (VATS)/RIGHT MIDDLE LOBECTOMY;  Surgeon: [name], MD;  Location: [facility];  Service: Thoracic;  Laterality: Right;  [name] VIDEO BRONCHOSCOPY WITH ENDOBRONCHIAL NAVIGATION N/A 09/23/2016   Procedure: VIDEO BRONCHOSCOPY WITH ENDOBRONCHIAL NAVIGATION;  Surgeon: [name], MD;  Location: [facility];  Service: Thoracic;  Laterality: N/A;  . VIDEO BRONCHOSCOPY WITH ENDOBRONCHIAL ULTRASOUND N/A 09/23/2016   Procedure: VIDEO BRONCHOSCOPY WITH ENDOBRONCHIAL ULTRASOUND;  Surgeon: [name], MD;  Location: [facility];  Service: Thoracic;  Laterality: N/A;     reports that he quit smoking about 41 years ago. He has a 23.00 pack-year smoking history. He has never used smokeless tobacco. He reports that he drinks alcohol. He reports that he does not use drugs. family history includes Alzheimer's disease in his mother and sister; Prostate cancer in his brother. Allergies  Allergen Reactions  . Alfuzosin Other (See Comments) and Hypertension    BP went crazy, dizzy, passing out   . Aricept [Donepezil Hcl] Other (See Comments)    Insomnia, headache  . Penicillins Hives and Other (See Comments)    A PCN REACTION WITH IMMEDIATE RASH, FACIAL/TONGUE/THROAT SWELLING, SOB, OR LIGHTHEADEDNESS WITH HYPOTENSION:  #  #  #  YES  #  #  #   Has patient had a PCN reaction causing severe rash involving mucus membranes or skin necrosis:No Has patient had a PCN reaction that required hospitalization:No Has patient had a PCN reaction occurring within the last 10 years:No If all of the above answers are "NO", then may proceed with Cephalosporin use.    Current Outpatient Medications on File Prior to Visit  Medication Sig Dispense Refill  . apixaban (ELIQUIS) 5 MG TABS tablet Take 1 tablet (5 mg total) by mouth 2 (two) times daily. 180 tablet 3  . calcium carbonate (TUMS - DOSED IN MG ELEMENTAL CALCIUM) 500 MG chewable tablet Chew 1 tablet by mouth 3 (three) times daily as needed for indigestion or heartburn.     . diclofenac sodium (VOLTAREN) 1 % GEL Apply 1 application topically at bedtime.    . finasteride (PROSCAR) 5 MG tablet Take 5 mg by mouth at bedtime.     . fluticasone (FLONASE) 50 MCG/ACT nasal spray Use 2 sprays in each nostril daily as needed for allergies  11  . furosemide (LASIX) 20 MG tablet Take 1 tablet (20 mg total) by mouth daily. 90 tablet 3  . memantine (NAMENDA) 10 MG tablet Take one tablet twice daily by mouth 60 tablet 5  . propafenone (RYTHMOL) 225 MG tablet Take 1 tablet (225 mg total) by mouth 2 (two) times daily. 60 tablet 11  .  silodosin (RAPAFLO) 8 MG CAPS capsule Take 8 mg by mouth at bedtime.     . traMADol (ULTRAM) 50 MG tablet Take 50 mg by mouth every 6 (six) hours as needed for moderate pain.    [name] zolpidem (AMBIEN) 5 MG tablet Take 5 mg by mouth at bedtime as needed for sleep.     No current facility-administered medications on file prior to visit.    Review of Systems  Constitutional: Negative for other unusual diaphoresis or sweats HENT: Negative for ear discharge or swelling Eyes: Negative for other worsening visual disturbances Respiratory: Negative for stridor or other swelling  Gastrointestinal: Negative for worsening distension or other blood Genitourinary: Negative for retention or other urinary change Musculoskeletal: Negative for other MSK pain or swelling Skin: Negative for color change or other new lesions Neurological:  Negative for worsening tremors and other numbness  Psychiatric/Behavioral: Negative for worsening agitation or other fatigue All other system neg per pt    Objective:   Physical Exam BP 102/60   Pulse 97   Temp 98.5 F (36.9 C) (Oral)   Ht 5\' 8"  (1.727 m)   Wt 140 lb (63.5 kg)   SpO2 96%   BMI 21.29 kg/m  VS noted, fatigued, non toxic Constitutional: Pt appears in NAD HENT: Head: NCAT.  Right Ear: External ear normal.  Left Ear: External ear normal.  Eyes: . Pupils are equal, round, and reactive to light. Conjunctivae and EOM are normal Nose: without d/c or deformity Neck: Neck supple. Gross normal ROM Cardiovascular: Normal rate and regular rhythm.   Pulmonary/Chest: Effort normal and breath sounds decreased without rales but with few bilat wheezing.  Neurological: Pt is alert. At baseline orientation, motor grossly intact Skin: Skin is warm. No rashes, other new lesions, no LE edema Psychiatric: Pt behavior is normal without agitation  No other exam findings  Lab Results  Component Value Date   WBC 4.9 11/09/2017   HGB 11.7 (L) 11/09/2017   HCT 34.5 (L)  11/09/2017   PLT 311.0 11/09/2017   GLUCOSE 126 (H) 11/09/2017   CHOL 169 03/18/2017   TRIG 122.0 03/18/2017   HDL 35.90 (L) 03/18/2017   LDLDIRECT 134.4 03/27/2010   LDLCALC 108 (H) 03/18/2017   ALT 8 11/09/2017   AST 11 11/09/2017   NA 137 11/09/2017   K 3.8 11/09/2017   CL 102 11/09/2017   CREATININE 0.94 11/09/2017   BUN 20 11/09/2017   CO2 30 11/09/2017   TSH 0.92 11/09/2017   PSA 1.91 03/18/2017   INR 1.12 07/04/2017   HGBA1C 5.2 08/19/2017          Assessment & Plan:

## 2017-11-09 NOTE — Patient Instructions (Signed)
Please take all new medication as prescribed - the cough medicine if needed, prednisone and inhaler as needed  OK to finish the biaxin  Please continue all other medications as before, and refills have been done if requested.  Please have the pharmacy call with any other refills you may need..  Please keep your appointments with your specialists as you may have planned  Please go to the XRAY Department in the Basement (go straight as you get off the elevator) for the x-ray testing  Please go to the LAB in the Basement (turn left off the elevator) for the tests to be done today  You will be contacted by phone if any changes need to be made immediately.  Otherwise, you will receive a letter about your results with an explanation, but please check with MyChart first.

## 2017-11-14 ENCOUNTER — Telehealth: Payer: Self-pay | Admitting: Licensed Clinical Social Worker

## 2017-11-14 NOTE — Telephone Encounter (Signed)
Palliative Care SW phoned patient to set up a home visit.  He stated he did not have his calendar and wished for SW to call another time.

## 2017-11-15 DIAGNOSIS — Z7289 Other problems related to lifestyle: Secondary | ICD-10-CM | POA: Diagnosis not present

## 2017-11-15 DIAGNOSIS — R05 Cough: Secondary | ICD-10-CM | POA: Diagnosis not present

## 2017-11-15 DIAGNOSIS — Z87891 Personal history of nicotine dependence: Secondary | ICD-10-CM | POA: Diagnosis not present

## 2017-11-15 DIAGNOSIS — R911 Solitary pulmonary nodule: Secondary | ICD-10-CM | POA: Diagnosis not present

## 2017-11-16 ENCOUNTER — Telehealth: Payer: Self-pay | Admitting: Licensed Clinical Social Worker

## 2017-11-16 NOTE — Telephone Encounter (Signed)
Palliative Care SW spoke with patient and scheduled a home visit for Monday, 9/23, at 1:30.

## 2017-11-21 ENCOUNTER — Other Ambulatory Visit: Payer: Medicare Other | Admitting: *Deleted

## 2017-11-21 ENCOUNTER — Other Ambulatory Visit: Payer: Medicare Other | Admitting: Licensed Clinical Social Worker

## 2017-11-21 DIAGNOSIS — Z515 Encounter for palliative care: Secondary | ICD-10-CM

## 2017-11-22 NOTE — Progress Notes (Signed)
COMMUNITY PALLIATIVE CARE RN NOTE  PATIENT NAME: Raymond Logan DOB: [birth year] MRN: [id number]  PRIMARY CARE PROVIDER: Biagio Borg, MD  RESPONSIBLE PARTY:  Acct ID - Guarantor Home Phone Work Phone Relationship Acct Type  [id number] [name]  Self P/F     [address], [place], [place] [address]   PLAN OF CARE and INTERVENTION:  1. ADVANCE CARE PLANNING/GOALS OF CARE: Patient would like to move to an AL unit where he could get more assistance with ADLs 2. PATIENT/CAREGIVER EDUCATION: Reinforced Safe Mobility and Importance of Energy Conservation 3. DISEASE STATUS: Joint visit made with Palliative Care SW, Raymond Logan. Wife also present during visit. Patient reports feelings of increased fatigue and weakness. Requiring more frequent rest periods with all activities. He was unable to participate last week in his usual table tennis games as he was too weak. Just walking into the building tired him out too much. He also c/o increased confusion and head "fogginess." His wife has been ill lately, so he has been having to do the grocery shopping and preparing of most meals, which he feels is becoming too much for him to handle. He also does not feel that he needs to be driving, but says he has to in order to pick up prescriptions and household items needed. Spoke with his daughter Raymond Logan who says that she is concerned about his increased confusion and difficulty sleeping at night, and feels that the recent increase in Namenda may be a contributing factor. He reports to me that he has never really been a "good sleeper." He states that his gait is becoming more unsteady and he has to be more careful not to fall. On night, he was feeling bad and thought he was dying and wrote a 3 page letter to his wife during this time. Continued dry, non-productive cough. Still using cough drops on a regular basis to help. Intake is poor. He appears more thin and frail overall. Raymond Logan is currently  searching for a [employer] for both of her parents so they can move closer to her and have more assistance.  Will continue to monitor.    HISTORY OF PRESENT ILLNESS:  This is a 84 yo male who resides at home with his wife. Palliative Care Team continues to follow. Next visit scheduled in 2 weeks.  CODE STATUS: FULL CODE  ADVANCED DIRECTIVES: N MOST FORM: Y PPS: 60%   PHYSICAL EXAM:   VITALS: Today's Vitals   11/21/17 1344  BP: 100/62  Pulse: (!) 55  Resp: 20  SpO2: 96%  PainSc: 0-No pain    LUNGS: decreased breath sounds CARDIAC: Cor Brady EXTREMITIES: No edema SKIN: Exposed skin is dry and intact  NEURO: Alert and oriented x 3, intermittent confusion, ambulatory   (Duration of visit and documentation 75 minutes)    Raymond Eastern, RN, BSN

## 2017-11-22 NOTE — Progress Notes (Signed)
COMMUNITY PALLIATIVE CARE SW NOTE  PATIENT NAME: Raymond Logan DOB: [birth year] MRN: [id number]  PRIMARY CARE PROVIDER: Biagio Borg, MD  RESPONSIBLE PARTY:  Acct ID - Guarantor Home Phone Work Phone Relationship Acct Type  [id number] [name]  Self P/F     [address], [place] [address]     PLAN OF CARE and INTERVENTIONS:             1. GOALS OF CARE/ ADVANCE CARE PLANNING:  Patient wants to remain in his home with his wife.  Patient has a MOST form. 2. SOCIAL/EMOTIONAL/SPIRITUAL ASSESSMENT/ INTERVENTIONS:  SW and Palliative Care RN, Daryl Eastern, met with patient and his wife, [name], in their home.  Patient's daughter, Eustaquio Maize, also called during the visit.  Patient reported Eustaquio Maize is looking for a possible assisted living near her.  [name] was very talkative during the visit and it was unclear whether her statements were accurate.  She talked about being in the hospital and the hospital bed talking to her.  Patient appears exhausted and states he has to take care of purchasing food.  [name] did not disclose why she was hospitalized.  Patient was minimally engaging and stated he wanted to move to an AL. 3. PATIENT/CAREGIVER EDUCATION/ COPING:  Patient copes by expressing himself, but was not as engaging with his wife present. 4. PERSONAL EMERGENCY PLAN:  Patient contact's his daughter, Eustaquio Maize, for health concerns.  He will also will rest when fatigued. 5. COMMUNITY RESOURCES COORDINATION/ HEALTH CARE NAVIGATION:  None. 6. FINANCIAL/LEGAL CONCERNS/INTERVENTIONS:  None per patient.     SOCIAL HX:  Social History   Tobacco Use  . Smoking status: Former Smoker    Packs/day: 1.00    Years: 23.00    Pack years: 23.00    Last attempt to quit: 1978    Years since quitting: 41.7  . Smokeless tobacco: Never Used  Substance Use Topics  . Alcohol use: Yes    Comment: rare    CODE STATUS:   Full code ADVANCED DIRECTIVES: N MOST FORM COMPLETE:  Y HOSPICE  EDUCATION PROVIDED:  N PPS:  Not discussed with SW during visit.  SW observed patient stand and ambulate independently. Duration of visit and documentation:  60 minutes.      Creola Corn Allice Garro, LCSW

## 2017-11-23 ENCOUNTER — Telehealth: Payer: Self-pay | Admitting: Neurology

## 2017-11-23 NOTE — Telephone Encounter (Signed)
I called the wife and then again I called the daughter, Raymond Logan.  Her telephone number is [phone].   The patient has been having increased confusion of the last week, he is not sleeping well, he feels like his mind is racing.  This sounds like a low-grade delirium state.  The patient has had problems with bladder infections in the past.  Okay to stop the Namenda for now, if he does not get better, he may need to see his primary physician for medical evaluation.  He was seen on 09 November 2017 by Dr. Jenny Reichmann, he had a cough at that time and was placed on a 5-day course of prednisone.  He is not on prednisone now.

## 2017-11-23 NOTE — Telephone Encounter (Signed)
RN Monishia with [facility] has called stating she went to see pt this week and daughter informed her of pt being more confused, foggy and not sleeping well.  Re: the Namenda being increased RN Monishia  Is asking for a call to discuss the possible side effects being causing pt to not sleep well.  RN Sandria Senter is asking for a call back

## 2017-12-02 ENCOUNTER — Other Ambulatory Visit: Payer: Self-pay | Admitting: *Deleted

## 2017-12-02 DIAGNOSIS — C342 Malignant neoplasm of middle lobe, bronchus or lung: Secondary | ICD-10-CM

## 2017-12-05 ENCOUNTER — Other Ambulatory Visit: Payer: Medicare Other | Admitting: *Deleted

## 2017-12-05 ENCOUNTER — Other Ambulatory Visit: Payer: Medicare Other | Admitting: Licensed Clinical Social Worker

## 2017-12-05 DIAGNOSIS — Z515 Encounter for palliative care: Secondary | ICD-10-CM

## 2017-12-06 NOTE — Progress Notes (Signed)
COMMUNITY PALLIATIVE CARE SW NOTE  PATIENT NAME: Raymond Logan DOB: 1930-07-17 MRN: [id number]  PRIMARY CARE PROVIDER: Biagio Borg, MD  RESPONSIBLE PARTY:  Acct ID - Guarantor Home Phone Work Phone Relationship Acct Type  [id number] [name]  Self P/F     [address], [place] [address]     PLAN OF CARE and INTERVENTIONS:             1. GOALS OF CARE/ ADVANCE CARE PLANNING:  Patient wishes to remain in his home with his wife. 2. SOCIAL/EMOTIONAL/SPIRITUAL ASSESSMENT/ INTERVENTIONS:  SW and Palliative Care RN, Raymond Logan, met with patient and his wife, Raymond Logan.  Patient reports memory loss.  He uses his GPS to get directions since he cannot remember.  Patient participated in life review, which he appeared to enjoy.  He repeated his stories more often during this visit.  When he was asked about moving to an AL, he said it was "too big of a move' and on hold for the moment.  Patient was able to play a few games of table tennis recently.  He denied pain. 3. PATIENT/CAREGIVER EDUCATION/ COPING:  Patient and his wife express their feelings and needs openly.  Patient will not talk if he does not know the answer to a questions. 4. PERSONAL EMERGENCY PLAN:  Patient calls his daughter, Raymond Logan, with medical concerns.  He will sit down when fatigued. 5. COMMUNITY RESOURCES COORDINATION/ HEALTH CARE NAVIGATION:  None. 6. FINANCIAL/LEGAL CONCERNS/INTERVENTIONS:  None     SOCIAL HX:  Social History   Tobacco Use  . Smoking status: Former Smoker    Packs/day: 1.00    Years: 23.00    Pack years: 23.00    Last attempt to quit: 1978    Years since quitting: 41.7  . Smokeless tobacco: Never Used  Substance Use Topics  . Alcohol use: Yes    Comment: rare    CODE STATUS:  Full Code ADVANCED DIRECTIVES: N MOST FORM COMPLETE:  Y HOSPICE EDUCATION PROVIDED: N PPS:  Patient reports he is not as hungry.  He was eating a slice of pizza upon arrival. Duration of visit and  documentation:  75 minutes.      Creola Corn Annleigh Knueppel, LCSW

## 2017-12-06 NOTE — Progress Notes (Signed)
COMMUNITY PALLIATIVE CARE RN NOTE  PATIENT NAME: Raymond Logan DOB: [birth year] MRN: [id number]  PRIMARY CARE PROVIDER: Biagio Borg, MD  RESPONSIBLE PARTY:  Acct ID - Guarantor Home Phone Work Phone Relationship Acct Type  [id number] [name]  Self P/F     [address], [place], [place] [address]    PLAN OF CARE and INTERVENTION:  1. ADVANCE CARE PLANNING/GOALS OF CARE: Patient now unsure if he still wants to move to a [place] his daughter, avoid hospitalizations 2. PATIENT/CAREGIVER EDUCATION: Reinforced Safe Mobility, Energy Conservations 3. DISEASE STATUS: Joint visit made with Palliative Care SW, Lynn Duffy. Patient answered the door upon our arrival. Patient dressed up today, however was unsure where he and his wife went this am. Pleasant mood and engaging in conversation. Denies pain. Appears as if he feels much better today, than noted 2 weeks ago, however he is still intermittently confused and forgetful. He reports becoming winded easily with exertion, but he was able to play 3 games of ping pong last week. Previous 2 weeks, he was unable to play at all due to fatigue of just walking into the [facility] from the parking lot. Last visit, spoke with daughter who felt that Namenda was causing patient to have more difficulty sleeping and more confusion. Dr. Jannifer Franklin agreed to discontinue this for now. Daughter came to visit patient yesterday and re-filled pill boxes for patient. He continues to drive and this week says he feels comfortable in doing so by using the GPS. He continues to do the grocery shopping and running errands. Wife has been feeling some better and has been able to do some of the meal preparation for dinner. Intake is fair. Wife makes sure patient is drinking his nutritional supplements more regularly. Continued non productive cough. Frequently drinks water and uses cough drops to help soothe his throat. He enjoys reminiscing and life review.  Questioned patient whether he is still looking into moving into a [facility] close to his daughter. He says that he feels a move would be "too much" for him at this time so is now unsure as to what he wants to do. Will continue to monitor.  HISTORY OF PRESENT ILLNESS:  This is a 84 yo male who resides at home with his wife. Palliative Care Team continues to follow patient to assist with symptom management and added support. Next visit scheduled in 1 month.  CODE STATUS: FULL CODE ADVANCED DIRECTIVES: N MOST FORM: Y PPS: 60%   PHYSICAL EXAM:   VITALS: Today's Vitals   12/05/17 1342  BP: 110/60  Pulse: 85  Resp: 18  SpO2: 99%  PainSc: 0-No pain    LUNGS: clear to auscultation  CARDIAC: HR irregular EXTREMITIES: No edema SKIN: Thin/frail skin, Exposed skin is dry and intact  NEURO: Alert and oriented x 3, intermittent confusion, forgetful, ambulatory   (Duration of visit and documentation 75 minutes)    Daryl Eastern, RN, BSN

## 2017-12-09 ENCOUNTER — Ambulatory Visit
Admission: RE | Admit: 2017-12-09 | Discharge: 2017-12-09 | Disposition: A | Discharge status: Home / Self Care | Payer: Medicare Other | Source: Ambulatory Visit | Attending: Thoracic Surgery (Cardiothoracic Vascular Surgery) | Admitting: Thoracic Surgery (Cardiothoracic Vascular Surgery)

## 2017-12-09 ENCOUNTER — Telehealth: Payer: Self-pay

## 2017-12-09 ENCOUNTER — Other Ambulatory Visit: Payer: Self-pay | Admitting: *Deleted

## 2017-12-09 DIAGNOSIS — Z85118 Personal history of other malignant neoplasm of bronchus and lung: Secondary | ICD-10-CM

## 2017-12-09 DIAGNOSIS — R911 Solitary pulmonary nodule: Secondary | ICD-10-CM | POA: Diagnosis not present

## 2017-12-09 NOTE — Telephone Encounter (Signed)
-----   Message from [name], MD sent at 12/09/2017 11:55 AM EDT ----- Dennis Bast can have him come in next week with a chest CT. If it gets too bad he will need to go to ED  [facility] ----- Message ----- From: Donnella Sham, RN Sent: 12/09/2017   9:27 AM EDT To: [name], MD  Hey,  Mr. Som wife, [name] called and stated that he is having pain at his incision site.  She said that it is swollen and he has not been feeling well the last couple days.  I asked if it looked red, draining, or infected, and she said no.  She said he stated that "the cancer has come back".  His 3 month follow-up appointment is not until Nov. 26th.  Please advise.    Thanks,  Caryl Pina

## 2017-12-09 NOTE — Telephone Encounter (Signed)
Patient contacted regarding new appointment time and CT scan time.  Patient aware and advised he was going to go today to have the CT done.  Patient is aware of his appointment next Monday.  Also advised patient that if the swelling and pain was unbearable to go to the ED instead of waiting for his appointment.  He acknowledged receipt.

## 2017-12-12 ENCOUNTER — Other Ambulatory Visit: Payer: Self-pay | Admitting: *Deleted

## 2017-12-12 ENCOUNTER — Ambulatory Visit (INDEPENDENT_AMBULATORY_CARE_PROVIDER_SITE_OTHER): Payer: Medicare Other | Admitting: Thoracic Surgery (Cardiothoracic Vascular Surgery)

## 2017-12-12 VITALS — BP 109/69 | HR 84 | Resp 20 | Ht 68.0 in | Wt 148.0 lb

## 2017-12-12 DIAGNOSIS — R911 Solitary pulmonary nodule: Secondary | ICD-10-CM | POA: Diagnosis not present

## 2017-12-12 DIAGNOSIS — R0781 Pleurodynia: Secondary | ICD-10-CM

## 2017-12-12 DIAGNOSIS — Z85118 Personal history of other malignant neoplasm of bronchus and lung: Secondary | ICD-10-CM

## 2017-12-12 NOTE — Progress Notes (Signed)
[address]       [place] [address]             [phone]     HPI: Raymond Logan returns with a c/o pain at the right costal margin  Raymond Logan is an 84 year old gentleman with a history of a stage IA adenocarcinoma of the right middle lobe.  He underwent a right middle lobectomy in August 2018.  The vascular margin was very close and he had postoperative radiation.  He did not tolerate the radiation very well and had a lot of problems associated with that.  In the office in August.  He had a PET CT which showed some minimal activity and a groundglass nodule in the right lower lobe and more significant hypermetabolic activity in the right atrium.  Radiology favored that being postsurgical and post radiation inflammation.  Discussed the possibility of biopsy but he was not in favor of being aggressive at that time.  I had planned to see him back in November, but he called on Friday with pain along the right costal margin.  He is not sure if the pains in the bone or in the soft tissue near the bone.  He thought it was from his lung.  It hurts when he takes a deep breath and when he moves in certain directions.  He has not taken any medication for it.  Past Medical History:  Diagnosis Date  . ALLERGIC RHINITIS 01/23/2007  . Arthritis    hands  . BENIGN PROSTATIC HYPERTROPHY 09/10/2006  . Chronic headaches   . Chronic sinusitis   . Complication of anesthesia    difficulty voiding- if cath needed needs to placed with need to be done with scope  . COPD, MILD 04/03/2010   patient denies on preop visit of 08/02/2014   . Difficulty in urination   . Dyspnea   . FATIGUE 01/08/2008  . GERD 04/15/2010  . H. pylori infection    hx of   . Headache(784.0) 09/10/2006  . HOARSENESS 04/15/2010  . HYPERLIPIDEMIA 09/10/2006  . INSOMNIA-SLEEP DISORDER-UNSPEC 04/24/2009  . Lung cancer ([place])   . PAF (paroxysmal atrial fibrillation) ([place])    identified on EKG 04/08/17  . PEPTIC ULCER  DISEASE 01/23/2007  . Pneumonia    1955     Current Outpatient Medications  Medication Sig Dispense Refill  . albuterol (PROVENTIL HFA;VENTOLIN HFA) 108 (90 Base) MCG/ACT inhaler Inhale 2 puffs into the lungs every 6 (six) hours as needed for wheezing or shortness of breath. 1 Inhaler 11  . apixaban (ELIQUIS) 5 MG TABS tablet Take 1 tablet (5 mg total) by mouth 2 (two) times daily. 180 tablet 3  . calcium carbonate (TUMS - DOSED IN MG ELEMENTAL CALCIUM) 500 MG chewable tablet Chew 1 tablet by mouth 3 (three) times daily as needed for indigestion or heartburn.     . diclofenac sodium (VOLTAREN) 1 % GEL Apply 1 application topically at bedtime.    . finasteride (PROSCAR) 5 MG tablet Take 5 mg by mouth at bedtime.     . fluticasone (FLONASE) 50 MCG/ACT nasal spray Use 2 sprays in each nostril daily as needed for allergies  11  . furosemide (LASIX) 20 MG tablet Take 1 tablet (20 mg total) by mouth daily. 90 tablet 3  . propafenone (RYTHMOL) 225 MG tablet Take 1 tablet (225 mg total) by mouth 2 (two) times daily. 60 tablet 11  . silodosin (RAPAFLO) 8 MG CAPS capsule  Take 8 mg by mouth at bedtime.     . traMADol (ULTRAM) 50 MG tablet Take 50 mg by mouth every 6 (six) hours as needed for moderate pain.    [name] zolpidem (AMBIEN) 5 MG tablet Take 5 mg by mouth at bedtime as needed for sleep.     No current facility-administered medications for this visit.     Physical Exam BP 109/69   Pulse 84   Resp 20   Ht 5\' 8"  (1.727 m)   Wt 148 lb (67.1 kg)   SpO2 97%   BMI 22.27 kg/m  84 year old man in no acute distress Alert and oriented x3 with no focal deficits Mild tenderness to palpation along the right costal margin posterior laterally and laterally  Diagnostic Tests: CT CHEST WITHOUT CONTRAST  TECHNIQUE: Multidetector CT imaging of the chest was performed using thin slice collimation for electromagnetic bronchoscopy planning purposes, without intravenous contrast.  COMPARISON:  PET-CT  10/04/2017 and chest CT 08/30/2017  FINDINGS: Cardiovascular: The heart is normal in size. Small amount of pericardial fluid. The aorta is normal in caliber. Stable minimal scattered atherosclerotic calcifications. Stable coronary artery calcifications.  Mediastinum/Nodes: No enlarged mediastinal or hilar lymph nodes are identified.  Lungs/Pleura: Stable dense radiation fibrosis involving the right paramediastinal lung. No obvious recurrent tumor. Stable loss of volume in the right hemithorax.  Ground-glass nodule in the right lower lobe has enlarged. It measures 23.5 x 13.5 mm on image number 92 and previously measured 18.5 x 11 mm. Findings suspicious for adenocarcinoma.  No new pulmonary lesions or acute overlying pulmonary process.  Upper Abdomen: No significant upper abdominal findings. No obvious hepatic or adrenal gland lesions without contrast. Stable large upper pole right renal cyst.  Musculoskeletal: No chest wall mass, supraclavicular or axillary lymphadenopathy. The thyroid gland is grossly normal.  No significant bony findings.  IMPRESSION: 1. Slight interval enlargement of the right lower lobe ground-glass nodule. 2. Stable extensive radiation changes involving the right paramediastinal lung. 3. No findings for upper abdominal metastatic disease or new lung lesions.  Aortic Atherosclerosis (ICD10-I70.0) and Emphysema (ICD10-J43.9).   Electronically Signed   By: Marijo Sanes M.D.   On: 12/09/2017 16:32 I personally reviewed the CT images and concur with the findings noted above  Impression: Raymond Logan is a an 84 year old man who had a right middle lobectomy followed by adjuvant radiation for stage IA adenocarcinoma the right middle lobe a year ago.  He was found last summer to have a groundglass nodule in the right lower lobe suspicious for a new adenocarcinoma in situ.  He now has pain along the right costal margin.  I carefully reviewed  his CT and it does not actually show the most inferior rib on the right side where the tenderness is.  I do not feel anything abnormal on exam, and he is not in severe pain.  This could be musculoskeletal in origin.  There is nothing on the CT that is significantly different from the CT in July or the CT done with the PET in August.  This CT from Friday does not show the lowermost rib, so I am going to get some plain rib films to see if there is any significant abnormality.  If his symptoms persist over time then we may need to do more imaging.  The groundglass nodule in the right lower lobe does appear to be slightly larger.  I recommended to him that we go ahead and do a navigational bronchoscopy to biopsy this  area we can also take some biopsies from the hilar mass/density.  Endobronchial ultrasound may be helpful in that regard.  I discussed potential treatment options other than surgery including stereotactic radiation or possibly even radiofrequency ablation of the tumor.  He is reluctant to have a biopsy done at this time, is agreeable to coming back in a couple of weeks to talk about it further.  Plan: PA, lateral and oblique x-rays of the right lower rib cage Acetaminophen PRN for pain Return in 2 weeks to further discuss options.  [name], MD [facility] [phone]

## 2017-12-15 ENCOUNTER — Other Ambulatory Visit: Payer: Self-pay | Admitting: *Deleted

## 2017-12-15 ENCOUNTER — Ambulatory Visit
Admission: RE | Admit: 2017-12-15 | Discharge: 2017-12-15 | Disposition: A | Discharge status: Home / Self Care | Payer: Medicare Other | Source: Ambulatory Visit | Attending: Thoracic Surgery (Cardiothoracic Vascular Surgery) | Admitting: Thoracic Surgery (Cardiothoracic Vascular Surgery)

## 2017-12-15 ENCOUNTER — Other Ambulatory Visit: Payer: Self-pay | Admitting: Thoracic Surgery (Cardiothoracic Vascular Surgery)

## 2017-12-15 DIAGNOSIS — R0789 Other chest pain: Secondary | ICD-10-CM | POA: Diagnosis not present

## 2017-12-15 DIAGNOSIS — R0781 Pleurodynia: Secondary | ICD-10-CM

## 2017-12-15 DIAGNOSIS — J984 Other disorders of lung: Secondary | ICD-10-CM | POA: Diagnosis not present

## 2017-12-27 ENCOUNTER — Ambulatory Visit (INDEPENDENT_AMBULATORY_CARE_PROVIDER_SITE_OTHER): Payer: Medicare Other | Admitting: Thoracic Surgery (Cardiothoracic Vascular Surgery)

## 2017-12-27 ENCOUNTER — Other Ambulatory Visit: Payer: Self-pay

## 2017-12-27 ENCOUNTER — Encounter: Payer: Self-pay | Admitting: Thoracic Surgery (Cardiothoracic Vascular Surgery)

## 2017-12-27 VITALS — BP 112/71 | HR 87 | Resp 16 | Ht 69.0 in | Wt 148.0 lb

## 2017-12-27 DIAGNOSIS — R911 Solitary pulmonary nodule: Secondary | ICD-10-CM

## 2017-12-27 NOTE — Progress Notes (Signed)
[address]       [place] [address]             [phone]       HPI: Mr. Keesling returns for a scheduled follow-up visit  Bain Whichard is an 84 year old man who had a right middle lobectomy followed by adjuvant radiation therapy for a stage Ia adenocarcinoma in the right middle lobe in 2018.  In the interim since then he is developed a new groundglass nodule in the right lower lobe.  He presented a couple of weeks ago with pain along the right costal margin.  His exam was unremarkable and his CT did not capture that area.  We did plain rib films but they did not show any abnormality.  He says the pain is resolved although he does still have some mild tenderness to palpation along the costal margin laterally.  Past Medical History:  Diagnosis Date  . ALLERGIC RHINITIS 01/23/2007  . Arthritis    hands  . BENIGN PROSTATIC HYPERTROPHY 09/10/2006  . Chronic headaches   . Chronic sinusitis   . Complication of anesthesia    difficulty voiding- if cath needed needs to placed with need to be done with scope  . COPD, MILD 04/03/2010   patient denies on preop visit of 08/02/2014   . Difficulty in urination   . Dyspnea   . FATIGUE 01/08/2008  . GERD 04/15/2010  . H. pylori infection    hx of   . Headache(784.0) 09/10/2006  . HOARSENESS 04/15/2010  . HYPERLIPIDEMIA 09/10/2006  . INSOMNIA-SLEEP DISORDER-UNSPEC 04/24/2009  . Lung cancer ([place])   . PAF (paroxysmal atrial fibrillation) ([place])    identified on EKG 04/08/17  . PEPTIC ULCER DISEASE 01/23/2007  . Pneumonia    1955    Current Outpatient Medications  Medication Sig Dispense Refill  . albuterol (PROVENTIL HFA;VENTOLIN HFA) 108 (90 Base) MCG/ACT inhaler Inhale 2 puffs into the lungs every 6 (six) hours as needed for wheezing or shortness of breath. 1 Inhaler 11  . apixaban (ELIQUIS) 5 MG TABS tablet Take 1 tablet (5 mg total) by mouth 2 (two) times daily. 180 tablet 3  . calcium carbonate (TUMS - DOSED IN MG  ELEMENTAL CALCIUM) 500 MG chewable tablet Chew 1 tablet by mouth 3 (three) times daily as needed for indigestion or heartburn.     . diclofenac sodium (VOLTAREN) 1 % GEL Apply 1 application topically at bedtime.    . finasteride (PROSCAR) 5 MG tablet Take 5 mg by mouth at bedtime.     . fluticasone (FLONASE) 50 MCG/ACT nasal spray Use 2 sprays in each nostril daily as needed for allergies  11  . furosemide (LASIX) 20 MG tablet Take 1 tablet (20 mg total) by mouth daily. 90 tablet 3  . propafenone (RYTHMOL) 225 MG tablet Take 1 tablet (225 mg total) by mouth 2 (two) times daily. 60 tablet 11  . silodosin (RAPAFLO) 8 MG CAPS capsule Take 8 mg by mouth at bedtime.     . traMADol (ULTRAM) 50 MG tablet Take 50 mg by mouth every 6 (six) hours as needed for moderate pain.    [name] zolpidem (AMBIEN) 5 MG tablet Take 5 mg by mouth at bedtime as needed for sleep.     No current facility-administered medications for this visit.     Physical Exam BP 112/71 (BP Location: Right Arm, Patient Position: Sitting, Cuff Size: Large)   Pulse 87   Resp 16  Ht 5\' 9"  (1.753 m)   Wt 148 lb (67.1 kg)   SpO2 96% Comment: ON RA  BMI 21.68 kg/m  84 year old man in no acute distress Alert and oriented x3 with no focal deficits Lungs clear bilaterally Mild tenderness to palpation along lateral aspect of right costal margin  Diagnostic Tests: CT CHEST WITHOUT CONTRAST  TECHNIQUE: Multidetector CT imaging of the chest was performed using thin slice collimation for electromagnetic bronchoscopy planning purposes, without intravenous contrast.  COMPARISON:  PET-CT 10/04/2017 and chest CT 08/30/2017  FINDINGS: Cardiovascular: The heart is normal in size. Small amount of pericardial fluid. The aorta is normal in caliber. Stable minimal scattered atherosclerotic calcifications. Stable coronary artery calcifications.  Mediastinum/Nodes: No enlarged mediastinal or hilar lymph nodes  are identified.  Lungs/Pleura: Stable dense radiation fibrosis involving the right paramediastinal lung. No obvious recurrent tumor. Stable loss of volume in the right hemithorax.  Ground-glass nodule in the right lower lobe has enlarged. It measures 23.5 x 13.5 mm on image number 92 and previously measured 18.5 x 11 mm. Findings suspicious for adenocarcinoma.  No new pulmonary lesions or acute overlying pulmonary process.  Upper Abdomen: No significant upper abdominal findings. No obvious hepatic or adrenal gland lesions without contrast. Stable large upper pole right renal cyst.  Musculoskeletal: No chest wall mass, supraclavicular or axillary lymphadenopathy. The thyroid gland is grossly normal.  No significant bony findings.  IMPRESSION: 1. Slight interval enlargement of the right lower lobe ground-glass nodule. 2. Stable extensive radiation changes involving the right paramediastinal lung. 3. No findings for upper abdominal metastatic disease or new lung lesions.  Aortic Atherosclerosis (ICD10-I70.0) and Emphysema (ICD10-J43.9).   Electronically Signed   By: Marijo Sanes M.D.   On: 12/09/2017 16:32 I reviewed the CT images again and concur with the findings noted above  Impression: Mr. Winner is an 84 year old man with a history of a stage Ia adenocarcinoma of the right middle lobe treated with lobectomy followed by adjuvant radiation for a close margin second 2018.  He now has a growing groundglass opacity in the right lower lobe suspicious for a new adenocarcinoma in situ.  There is no significant solid component without lesion at this time.  I discussed biopsying the lesion with him and his wife.  He is reluctant to have any invasive procedure.  We will refer him back to Dr. [name] to see if he would be a candidate for possible stereotactic radiation and if he be willing to do that without a biopsy.  If a biopsy is necessary I would be happy to do a  navigational bronchoscopy.  Rib pain-pain resolved although he is still tender right at the very inferior most aspect of his costal margin laterally.  No abnormalities noted on chest x-ray.  Continue to follow clinically.  Plan: We will refer back to Dr. [name] to see if he would be a candidate for stereotactic radiation.  Return in 3 weeks to further discuss  [name], MD [facility] [phone]

## 2017-12-31 ENCOUNTER — Other Ambulatory Visit: Payer: Self-pay | Admitting: Internal Medicine

## 2018-01-02 ENCOUNTER — Other Ambulatory Visit: Payer: Medicare Other | Admitting: *Deleted

## 2018-01-02 ENCOUNTER — Other Ambulatory Visit: Payer: Medicare Other | Admitting: Licensed Clinical Social Worker

## 2018-01-02 DIAGNOSIS — Z515 Encounter for palliative care: Secondary | ICD-10-CM

## 2018-01-02 NOTE — Progress Notes (Signed)
COMMUNITY PALLIATIVE CARE SW NOTE  PATIENT NAME: Raymond Logan DOB: 1930/04/26 MRN: [id number]  PRIMARY CARE PROVIDER: Biagio Borg, MD  RESPONSIBLE PARTY:  Acct ID - Guarantor Home Phone Work Phone Relationship Acct Type  [id number] [name]  Self P/F     [address], [place] [address]     PLAN OF CARE and INTERVENTIONS:             1. GOALS OF CARE/ ADVANCE CARE PLANNING:  Patient's goal is to remain at home with his wife, Raymond Logan, as long as possible.  Patient is a full code. 2. SOCIAL/EMOTIONAL/SPIRITUAL ASSESSMENT/ INTERVENTIONS:  SW met with patient's wife, Raymond Logan.  She said patient had gone to the drug store and she attempted to contact him via cell phone. SW provided active listening and supportive counseling to Raymond Logan while she discussed her relationship with patient.  She stated she is very dependent on him. He returned home after SW had been meeting with patient's wife for 30 minutes.  He stated he forgot.  He denied pain and had no current concerns. 3. PATIENT/CAREGIVER EDUCATION/ COPING:  Patient and his wife express their feelings openly.  Patient copes by remaining as physically active as possible. 4. PERSONAL EMERGENCY PLAN:  Patient will call his daughter, Raymond Logan, with medical concerns.  He will rest when fatigued. 5. COMMUNITY RESOURCES COORDINATION/ HEALTH CARE NAVIGATION:  None 6. FINANCIAL/LEGAL CONCERNS/INTERVENTIONS:  None     SOCIAL HX:  Social History   Tobacco Use  . Smoking status: Former Smoker    Packs/day: 1.00    Years: 23.00    Pack years: 23.00    Last attempt to quit: 1978    Years since quitting: 41.8  . Smokeless tobacco: Never Used  Substance Use Topics  . Alcohol use: Yes    Comment: rare    CODE STATUS:  Full Code ADVANCED DIRECTIVES: N MOST FORM COMPLETE:  Y HOSPICE EDUCATION PROVIDED: N PPS:  Patients appetite is normal.  He is able to stand and ambulate independently. Duration of visit and documentation:   60 minutes.      Creola Corn Naitik Hermann, LCSW

## 2018-01-02 NOTE — Telephone Encounter (Signed)
Done erx 

## 2018-01-02 NOTE — Progress Notes (Signed)
Thoracic Location of Tumor / Histology: Right Lower lobe lung nodule hx of lung cancer with radiation treatment  Patient presented with pain along the right costal margin.  Rib xray 12/15/2017: No acute rib abnormality noted.  CT Chest 12/09/2017: Ground-glass nodule in the right lower lobe has enlarged.  It measures 23.5 x 13.5 mm and previously measured 18.5 x 11 mm.  Findings suspicious for adenocarcinoma.  PET 10/04/2017: ground-glass nodule in the right lower lobe, low hypermetabolic activity.  Right hilum had intense hypermetabolic activity which could represent inflammation secondary to radiation versus disease recurrence.   Biopsies of   Tobacco/Marijuana/Snuff/ETOH use: Former smoker  Past/Anticipated interventions by cardiothoracic surgery, if any:  Dr. Roxan Hockey 12/27/2017 -I discussed biopsying the lesion with him and his wife.  He is reluctant to have any invasive procedure. -We will refer him back to Dr. [name] to see if he would be a candidate for possible stereotactic radiation and if he be willing to do that without biopsy. -If a biopsy is necessary I  Would be happy to do a navigational bronchoscopy. -Return in 3 weeks to further discuss. 01/17/2018  Past/Anticipated interventions by medical oncology, if any:  NP Curcio 10/06/2017 -Recommend that he see Dr. Roxan Hockey for consideration of a bronchoscopy to further evaluate the area of hypermetabolic activity in the right hilum. -We will also send tissue from his original biopsy for foundation 1 testing and PDL1 testing. -We have tentatively scheduled Mr. Meinhardt back for lab and a CT in approximately 6 months and a return visit 1 to 2 days after the CT scan to discuss the results.  However, we will see him much sooner if the biopsy shows evidence of malignancy.  Signs/Symptoms  Weight changes, if any: No  Respiratory complaints, if any: SOB noted with activity.  Hemoptysis, if any: occasional cough, non  productive  Pain issues, if any:  No  BP 113/69 (BP Location: Right Arm, Patient Position: Sitting)   Pulse 91   Resp 20   Ht '5\' 9"'$  (1.753 m)   Wt 149 lb 12.8 oz (67.9 kg)   SpO2 100%   BMI 22.12 kg/m   Wt Readings from Last 3 Encounters:  01/03/18 149 lb 12.8 oz (67.9 kg)  12/27/17 148 lb (67.1 kg)  12/12/17 148 lb (67.1 kg)   SAFETY ISSUES:  Prior radiation? Completed radiation to the right lung in November 2018.  Pacemaker/ICD?  No  Possible current pregnancy? No  Is the patient on methotrexate? No  Current Complaints / other details:   -Right middle lobectomy with lymph node dissection with very close vascular resection margin, This was followed by adjuvant radiation therapy for a stage Ia adenocarcinoma-July 2018.

## 2018-01-02 NOTE — Telephone Encounter (Signed)
   LOV:11/09/17 NextOV:04/12/18  Last Filled/Quantity:08/22/17 90#

## 2018-01-02 NOTE — Progress Notes (Signed)
COMMUNITY PALLIATIVE CARE RN NOTE  PATIENT NAME: Raymond Logan DOB: 1930/05/09 MRN: [id number]  PRIMARY CARE PROVIDER: Biagio Borg, MD  RESPONSIBLE PARTY:  Acct ID - Guarantor Home Phone Work Phone Relationship Acct Type  [id number] [name]  Self P/F     [address], [place], [place] [address]    PLAN OF CARE and INTERVENTION:  1. ADVANCE CARE PLANNING/GOALS OF CARE: Remain at home with his wife as long as possible and avoid hospitalizations 2. PATIENT/CAREGIVER EDUCATION: Reinforced Safe Mobility 3. DISEASE STATUS: Joint visit made with Palliative Care SW, Lynn Duffy. Upon arrival, wife answered the door and stated that patient had gone out to the store. Wife attempted to contact patient on his cell phone, but he did not answer. She wanted the team to wait until he returned. Met with patient outside while leaving and he stated that he forgot about the visit, but felt ok and did not have any pain, issues or concerns at this time. He continues to drive and run errands. He is Independent with ADLs. Continues with some dyspnea with exertion, but is able to recover at rest. Continues to play ping pong weekly at the [facility]. Number of games depends on how easily patient becomes tired/short of breath. Intake is fair and he continues with nutritional supplements daily. Will continue to monitor.    HISTORY OF PRESENT ILLNESS:  This is a 84 yo male who resides at home with his wife. Palliative Care Team continues to follow patient. Will continue to visit monthly and PRN.  CODE STATUS: FULL CODE ADVANCED DIRECTIVES: N MOST FORM: yes PPS: 60%   PHYSICAL EXAM:   LUNGS: No respiratory distress CARDIAC: Cor RRR EXTREMITIES: No edema SKIN: Exposed skin dry and intact  NEURO: Alert and oriented x 3, forgetful, ambulatory   (Duration of visit and documentation 60 minutes)    Daryl Eastern, RN, BSN

## 2018-01-03 ENCOUNTER — Other Ambulatory Visit: Payer: Self-pay

## 2018-01-03 ENCOUNTER — Encounter: Payer: Self-pay | Admitting: Radiation Oncology

## 2018-01-03 ENCOUNTER — Ambulatory Visit
Admission: RE | Admit: 2018-01-03 | Discharge: 2018-01-03 | Disposition: A | Discharge status: Home / Self Care | Payer: Medicare Other | Source: Ambulatory Visit | Attending: Radiation Oncology | Admitting: Radiation Oncology

## 2018-01-03 VITALS — BP 113/69 | HR 91 | Resp 20 | Ht 69.0 in | Wt 149.8 lb

## 2018-01-03 DIAGNOSIS — C342 Malignant neoplasm of middle lobe, bronchus or lung: Secondary | ICD-10-CM | POA: Diagnosis not present

## 2018-01-03 DIAGNOSIS — R918 Other nonspecific abnormal finding of lung field: Secondary | ICD-10-CM | POA: Diagnosis not present

## 2018-01-03 DIAGNOSIS — Z7901 Long term (current) use of anticoagulants: Secondary | ICD-10-CM | POA: Insufficient documentation

## 2018-01-03 DIAGNOSIS — Z85118 Personal history of other malignant neoplasm of bronchus and lung: Secondary | ICD-10-CM | POA: Diagnosis not present

## 2018-01-03 DIAGNOSIS — Z880 Allergy status to penicillin: Secondary | ICD-10-CM | POA: Insufficient documentation

## 2018-01-03 DIAGNOSIS — Z79899 Other long term (current) drug therapy: Secondary | ICD-10-CM | POA: Diagnosis not present

## 2018-01-03 DIAGNOSIS — C3431 Malignant neoplasm of lower lobe, right bronchus or lung: Secondary | ICD-10-CM

## 2018-01-03 DIAGNOSIS — Z923 Personal history of irradiation: Secondary | ICD-10-CM | POA: Diagnosis not present

## 2018-01-03 DIAGNOSIS — Z87891 Personal history of nicotine dependence: Secondary | ICD-10-CM | POA: Insufficient documentation

## 2018-01-03 DIAGNOSIS — R911 Solitary pulmonary nodule: Secondary | ICD-10-CM | POA: Insufficient documentation

## 2018-01-03 NOTE — Progress Notes (Signed)
Radiation Oncology         [phone] ________________________________  Name: Raymond Logan MRN: [id number]  Date of Service: 01/03/2018 DOB: 1930-05-08  Follow Up Note  CC: Biagio Borg, MD  [name], *  Diagnosis:   Stage IB, pT1cN0, NSCLC, adenocarcinoma of the right middle lobe of the lung with new finding of RLL lesion.  Interval Since Last Radiation: 1 year   11/18/2016 - 12/30/2016: The patient was treated to the right middle lobe surgical bed to a dose of 60 Gy in 30 fractions using a 3 field, 3-D conformal technique.   Narrative:   In summary the patient was treated with radiotherapy for a positive margin following surgical resection of a stage I lung cancer. He tolerated radiation with the exception of "mental fogginess." After reviewing his case with his daughter who's a physical therapist, the family had noticed increasing mental confusion and decline. He did undergo a brain MRI to rule out metastatic disease which was negative for metastases on  12/22/16. He is followed by Dr. Jannifer Franklin in Neurology. He has been followed in surveillance since completing radiotherapy. A scan in February 2019 revealed a new 6 mm nodule in the right lower lobe, and a 5.7 mm nodule in the posterior superior aspect of the right major fissure.  There is an enlarged AP window lymph node and a 15 mm subcarinal lymph node both new since the prior study, he underwent repeat CT scan on 08/30/2017 which revealed interval growth of a sub-solid 1.7 cm right lower lobe nodule and perihilar radiation fibrosis.  A small dependent right pleural effusion was again noted, and no adenopathy was noted.  PET/CT on 10/04/2017 revealed an SUV max of 1.6 in the right lower lobe lesion, post surgical and post radiotherapy changes within the right aspect of the lung is fissure, there is a 4 mm nodule in the left lung at the oblique fissure, and increased hypermetabolic activity in the small left hilar lymph node with  an SUV of 7.5, previously in 2018 it was 4.9.  He is referred back for additional CT imaging on 12/09/2017 and slight interval enlargement of the right lower lobe nodule was identified measuring 23.5 x 13.5 mm, no enlarged mediastinal or hilar nodes were identified.                    On review of systems, the patient reports that he is doing well overall. He does have trouble with stamina and continues to have the ability to play ping pong but is fatigued some days more than others. At times he has some shortness of breath with exertion. He denies any chest pain, cough, fevers, chills, night sweats, unintended weight changes. He denies any bowel or bladder disturbances, and denies abdominal pain, nausea or vomiting. He denies any new musculoskeletal or joint aches or pains, new skin lesions or concerns. A complete review of systems is obtained and is otherwise negative.   Past Medical History:  Past Medical History:  Diagnosis Date  . ALLERGIC RHINITIS 01/23/2007  . Arthritis    hands  . BENIGN PROSTATIC HYPERTROPHY 09/10/2006  . Chronic headaches   . Chronic sinusitis   . Complication of anesthesia    difficulty voiding- if cath needed needs to placed with need to be done with scope  . COPD, MILD 04/03/2010   patient denies on preop visit of 08/02/2014   . Difficulty in urination   . Dyspnea   .  FATIGUE 01/08/2008  . GERD 04/15/2010  . H. pylori infection    hx of   . Headache(784.0) 09/10/2006  . HOARSENESS 04/15/2010  . HYPERLIPIDEMIA 09/10/2006  . INSOMNIA-SLEEP DISORDER-UNSPEC 04/24/2009  . Lung cancer ([facility])   . PAF (paroxysmal atrial fibrillation) ([facility])    identified on EKG 04/08/17  . PEPTIC ULCER DISEASE 01/23/2007  . Pneumonia    1955    Past Surgical History: Past Surgical History:  Procedure Laterality Date  . CATARACT EXTRACTION Left   . COLONOSCOPY    . CYSTOSCOPY N/A 10/07/2016   Procedure: CYSTOSCOPY;  Surgeon: Festus Aloe, MD;  Location: [facility];  Service: Urology;   Laterality: N/A;  . HEMORRHOID BANDING    . INSERTION OF SUPRAPUBIC CATHETER N/A 10/07/2016   Procedure: FOLEY  CATHETER PLACEMENT;  Surgeon: Festus Aloe, MD;  Location: [facility];  Service: Urology;  Laterality: N/A;  . KNEE ARTHROSCOPY Left    torn meniscus  . LUMBAR LAMINECTOMY/DECOMPRESSION MICRODISCECTOMY Right 08/07/2014   Procedure: complete DECOMPRESSION LUMBAR LAMINECTOMY/MICRODISCECTOMY OF L4-L5 for spinal stenosis, DECOMPRESSION OF L3-L4;  Surgeon: Latanya Maudlin, MD;  Location: [facility];  Service: Orthopedics;  Laterality: Right;  . NASAL SINUS SURGERY Left 07/04/2017   Procedure: LEFT ENDOSCOPIC SINUS SURGERY;  Surgeon: Jerrell Belfast, MD;  Location: [facility];  Service: ENT;  Laterality: Left;  . RIGHT/LEFT HEART CATH AND CORONARY ANGIOGRAPHY N/A 06/21/2017   Procedure: RIGHT/LEFT HEART CATH AND CORONARY ANGIOGRAPHY;  Surgeon: Belva Crome, MD;  Location: [facility];  Service: Cardiovascular;  Laterality: N/A;  . ROTATOR CUFF REPAIR Right   . SHOULDER ARTHROSCOPY     x3, L & R  . SINUS ENDO WITH FUSION N/A 07/04/2017   Procedure: SINUS ENDO WITH FUSION;  Surgeon: Jerrell Belfast, MD;  Location: [facility];  Service: ENT;  Laterality: N/A;  . VIDEO ASSISTED THORACOSCOPY (VATS)/ LOBECTOMY Right 10/07/2016   Procedure: VIDEO ASSISTED THORACOSCOPY (VATS)/RIGHT MIDDLE LOBECTOMY;  Surgeon: [name], MD;  Location: [facility];  Service: Thoracic;  Laterality: Right;  [name] VIDEO BRONCHOSCOPY WITH ENDOBRONCHIAL NAVIGATION N/A 09/23/2016   Procedure: VIDEO BRONCHOSCOPY WITH ENDOBRONCHIAL NAVIGATION;  Surgeon: [name], MD;  Location: [facility];  Service: Thoracic;  Laterality: N/A;  . VIDEO BRONCHOSCOPY WITH ENDOBRONCHIAL ULTRASOUND N/A 09/23/2016   Procedure: VIDEO BRONCHOSCOPY WITH ENDOBRONCHIAL ULTRASOUND;  Surgeon: [name], MD;  Location: [facility];  Service: Thoracic;  Laterality: N/A;    Social History:  Social History   Socioeconomic History  . Marital status:  Married    Spouse name: Not on file  . Number of children: 3  . Years of education: Not on file  . Highest education level: Not on file  Occupational History  . Occupation: Retired    Fish farm manager: RETIRED  Social Needs  . Financial resource strain: Not on file  . Food insecurity:    Worry: Not on file    Inability: Not on file  . Transportation needs:    Medical: No    Non-medical: No  Tobacco Use  . Smoking status: Former Smoker    Packs/day: 1.00    Years: 23.00    Pack years: 23.00    Last attempt to quit: 1978    Years since quitting: 41.8  . Smokeless tobacco: Never Used  Substance and Sexual Activity  . Alcohol use: Yes    Comment: rare  . Drug use: No  . Sexual activity: Not on file  Lifestyle  . Physical activity:    Days  per week: Not on file    Minutes per session: Not on file  . Stress: Not on file  Relationships  . Social connections:    Talks on phone: Not on file    Gets together: Not on file    Attends religious service: Not on file    Active member of club or organization: Not on file    Attends meetings of clubs or organizations: Not on file    Relationship status: Not on file  . Intimate partner violence:    Fear of current or ex partner: Not on file    Emotionally abused: Not on file    Physically abused: Not on file    Forced sexual activity: Not on file  Other Topics Concern  . Not on file  Social History Narrative  . Not on file  The patient is married and is accompanied by his wife.He is retired from working for [employer], and plays [place] at the senior center weekly.   Family History: Family History  Problem Relation Age of Onset  . Prostate cancer Brother   . Alzheimer's disease Mother   . Alzheimer's disease Sister      ALLERGIES:  is allergic to alfuzosin; aricept [donepezil hcl]; and penicillins.  Meds: Current Outpatient Medications  Medication Sig Dispense Refill  . albuterol (PROVENTIL HFA;VENTOLIN HFA) 108 (90  Base) MCG/ACT inhaler Inhale 2 puffs into the lungs every 6 (six) hours as needed for wheezing or shortness of breath. 1 Inhaler 11  . apixaban (ELIQUIS) 5 MG TABS tablet Take 1 tablet (5 mg total) by mouth 2 (two) times daily. 180 tablet 3  . calcium carbonate (TUMS - DOSED IN MG ELEMENTAL CALCIUM) 500 MG chewable tablet Chew 1 tablet by mouth 3 (three) times daily as needed for indigestion or heartburn.     . diclofenac sodium (VOLTAREN) 1 % GEL Apply 1 application topically at bedtime.    . finasteride (PROSCAR) 5 MG tablet Take 5 mg by mouth at bedtime.     . fluticasone (FLONASE) 50 MCG/ACT nasal spray Use 2 sprays in each nostril daily as needed for allergies  11  . furosemide (LASIX) 20 MG tablet Take 1 tablet (20 mg total) by mouth daily. 90 tablet 3  . propafenone (RYTHMOL) 225 MG tablet Take 1 tablet (225 mg total) by mouth 2 (two) times daily. 60 tablet 11  . silodosin (RAPAFLO) 8 MG CAPS capsule Take 8 mg by mouth at bedtime.     . traMADol (ULTRAM) 50 MG tablet Take 50 mg by mouth every 6 (six) hours as needed for moderate pain.    [name] zolpidem (AMBIEN) 5 MG tablet TAKE 1 TABLET BY MOUTH AT BEDTIME AS NEEDED FOR SLEEP 30 tablet 2   No current facility-administered medications for this encounter.     Physical Findings:  height is 5\' 9"  (1.753 m) and weight is 149 lb 12.8 oz (67.9 kg). His blood pressure is 113/69 and his pulse is 91. His respiration is 20 and oxygen saturation is 100%.  Pain Assessment Pain Score: 0-No pain/10 In general this is a well appearing caucasian male in no acute distress. He's alert and oriented x4 and appropriate throughout the examination. Cardiopulmonary assessment is negative for acute distress and he exhibits normal effort and he has clear breath sounds bilaterally, and regular rate and rhythm, no C/R/M are noted.  Lab Findings: Lab Results  Component Value Date   WBC 4.9 11/09/2017   HGB 11.7 (L) 11/09/2017  HCT 34.5 (L) 11/09/2017   MCV 87.3  11/09/2017   PLT 311.0 11/09/2017     Radiographic Findings: Dg Chest 2 View  Result Date: 12/15/2017 CLINICAL DATA:  Chest and rib pain. EXAM: CHEST - 2 VIEW COMPARISON:  12/15/2017.  CT 12/09/2017. FINDINGS: Postsurgical changes and changes of radiation fibrosis again noted in the right perihilar region. Surgical sutures noted over the right hilum and over the right mid lung. Right base density again noted. Reference is made to prior CT report of 12/09/2017 for further discussion. Stable right base pleural thickening and/or effusion. No pneumothorax. Heart size normal. No acute bony abnormality. IMPRESSION: Stable chest with stable changes of prior surgery and radiation changes in the right hilum. Right base density again noted. Reference is made to recent CT report of 12/09/2017 for further discussion of right base density. Electronically Signed   By: Marcello Moores  Register   On: 12/15/2017 12:31   Dg Ribs Unilateral Right  Result Date: 12/15/2017 CLINICAL DATA:  Right-sided chest pain for 2 days, no known injury, initial encounter EXAM: RIGHT RIBS - 2 VIEW COMPARISON:  12/09/2017 FINDINGS: Cardiac shadow is stable. Postsurgical changes as well as post irradiation scarring are seen in the right hilum consistent with the known history. No pneumothorax is noted. Minimal blunting of the right costophrenic angle is seen. No acute rib abnormality is noted. IMPRESSION: No acute rib abnormality noted. Chronic changes in the right hilum. Electronically Signed   By: Inez Catalina M.D.   On: 12/15/2017 12:34   Ct Super D Chest Wo Contrast  Result Date: 12/09/2017 CLINICAL DATA:  Preop for biopsy of a suspicious right lower lobe ground-glass nodule. EXAM: CT CHEST WITHOUT CONTRAST TECHNIQUE: Multidetector CT imaging of the chest was performed using thin slice collimation for electromagnetic bronchoscopy planning purposes, without intravenous contrast. COMPARISON:  PET-CT 10/04/2017 and chest CT 08/30/2017  FINDINGS: Cardiovascular: The heart is normal in size. Small amount of pericardial fluid. The aorta is normal in caliber. Stable minimal scattered atherosclerotic calcifications. Stable coronary artery calcifications. Mediastinum/Nodes: No enlarged mediastinal or hilar lymph nodes are identified. Lungs/Pleura: Stable dense radiation fibrosis involving the right paramediastinal lung. No obvious recurrent tumor. Stable loss of volume in the right hemithorax. Ground-glass nodule in the right lower lobe has enlarged. It measures 23.5 x 13.5 mm on image number 92 and previously measured 18.5 x 11 mm. Findings suspicious for adenocarcinoma. No new pulmonary lesions or acute overlying pulmonary process. Upper Abdomen: No significant upper abdominal findings. No obvious hepatic or adrenal gland lesions without contrast. Stable large upper pole right renal cyst. Musculoskeletal: No chest wall mass, supraclavicular or axillary lymphadenopathy. The thyroid gland is grossly normal. No significant bony findings. IMPRESSION: 1. Slight interval enlargement of the right lower lobe ground-glass nodule. 2. Stable extensive radiation changes involving the right paramediastinal lung. 3. No findings for upper abdominal metastatic disease or new lung lesions. Aortic Atherosclerosis (ICD10-I70.0) and Emphysema (ICD10-J43.9). Electronically Signed   By: Marijo Sanes M.D.   On: 12/09/2017 16:32    Impression/Plan: 1. RLL pulmonary nodule and question of left hilar adenopathy. Dr. [name] reviews the patient's imaging from February up until now. He discusses with the patient the discrepancy between the low SUV of the PET and growth of the RLL lesion, as well as the discrepcancy between the high SUV of the left hilar node seen on PET, but no adenopathy on the most recent CT scan. Dr. [name] would agree with proceeding with biopsy. He is scheduled to proceed  with another Super D CT scan and to meet with Dr. Roxan Hockey. If he was planning  EBUS, knowing the underlying process in the left hilum would be most helpful if there was enlargement of this nodal station. We would like to follow up with his biopsy results for both sites and reconvene. He may be a candidate for SBRT, and we discussed the risks, benefits, short, and long term effects of if we did SBRT in 3-5 fractions to the RLL. If he did have disease in the mediastinal nodes, this could certainly change his overall approach for treatment. 2. Stage IB, pT1cN0, NSCLC, adenocarcinoma of the right middle lobe of the lung. The patient is doing well following his radiotherapy treatment. He will continue to be followed in surveillance with Dr. Julien Nordmann.  In a visit lasting 45 minutes, greater than 50% of the time was spent face to face discussing his case, and coordinating the patient's care.  The above documentation reflects my direct findings during this shared patient visit. Please see the separate note by Dr. [name] on this date for the remainder of the patient's plan of care.     Carola Rhine, PAC

## 2018-01-05 DIAGNOSIS — N401 Enlarged prostate with lower urinary tract symptoms: Secondary | ICD-10-CM | POA: Diagnosis not present

## 2018-01-05 DIAGNOSIS — R3121 Asymptomatic microscopic hematuria: Secondary | ICD-10-CM | POA: Diagnosis not present

## 2018-01-05 DIAGNOSIS — R350 Frequency of micturition: Secondary | ICD-10-CM | POA: Diagnosis not present

## 2018-01-05 DIAGNOSIS — R972 Elevated prostate specific antigen [PSA]: Secondary | ICD-10-CM | POA: Diagnosis not present

## 2018-01-17 ENCOUNTER — Other Ambulatory Visit: Payer: Self-pay | Admitting: *Deleted

## 2018-01-17 ENCOUNTER — Encounter: Payer: Self-pay | Admitting: Thoracic Surgery (Cardiothoracic Vascular Surgery)

## 2018-01-17 ENCOUNTER — Ambulatory Visit (INDEPENDENT_AMBULATORY_CARE_PROVIDER_SITE_OTHER): Payer: Medicare Other | Admitting: Thoracic Surgery (Cardiothoracic Vascular Surgery)

## 2018-01-17 VITALS — BP 114/72 | HR 89 | Resp 20 | Ht 69.0 in | Wt 152.0 lb

## 2018-01-17 DIAGNOSIS — R911 Solitary pulmonary nodule: Secondary | ICD-10-CM | POA: Diagnosis not present

## 2018-01-17 DIAGNOSIS — C3491 Malignant neoplasm of unspecified part of right bronchus or lung: Secondary | ICD-10-CM | POA: Diagnosis not present

## 2018-01-17 NOTE — H&P (View-Only) (Signed)
[address]       [place] [address]             [phone]    HPI: Mr. Wagar returns for follow-up regarding a right lower lobe lung nodule  Trey Gulbranson is an 84 year old gentleman who had a right middle lobectomy for a stage Ia adenocarcinoma in 2018.  His margin was close and he was treated with postoperative adjuvant radiation.  He has developed a groundglass opacity in the right lower lobe that has been slowly enlarging.  On his most recent CT in October it measured 2.3 x 1.3 cm.  It is highly suspicious for a new primary bronchogenic adenocarcinoma in situ.  He recently saw Dr. [name] to discuss possible stereotactic radiation.  He has decided to proceed with that and Dr. [name] would like a biopsy prior.  Past Medical History:  Diagnosis Date  . ALLERGIC RHINITIS 01/23/2007  . Arthritis    hands  . BENIGN PROSTATIC HYPERTROPHY 09/10/2006  . Chronic headaches   . Chronic sinusitis   . Complication of anesthesia    difficulty voiding- if cath needed needs to placed with need to be done with scope  . COPD, MILD 04/03/2010   patient denies on preop visit of 08/02/2014   . Difficulty in urination   . Dyspnea   . FATIGUE 01/08/2008  . GERD 04/15/2010  . H. pylori infection    hx of   . Headache(784.0) 09/10/2006  . HOARSENESS 04/15/2010  . HYPERLIPIDEMIA 09/10/2006  . INSOMNIA-SLEEP DISORDER-UNSPEC 04/24/2009  . Lung cancer ([place])   . PAF (paroxysmal atrial fibrillation) ([place])    identified on EKG 04/08/17  . PEPTIC ULCER DISEASE 01/23/2007  . Pneumonia    1955   Past Surgical History:  Procedure Laterality Date  . CATARACT EXTRACTION Left   . COLONOSCOPY    . CYSTOSCOPY N/A 10/07/2016   Procedure: CYSTOSCOPY;  Surgeon: Festus Aloe, MD;  Location: [facility];  Service: Urology;  Laterality: N/A;  . HEMORRHOID BANDING    . INSERTION OF SUPRAPUBIC CATHETER N/A 10/07/2016   Procedure: FOLEY  CATHETER PLACEMENT;  Surgeon: Festus Aloe, MD;  Location: [facility];  Service: Urology;  Laterality: N/A;  . KNEE ARTHROSCOPY Left    torn meniscus  . LUMBAR LAMINECTOMY/DECOMPRESSION MICRODISCECTOMY Right 08/07/2014   Procedure: complete DECOMPRESSION LUMBAR LAMINECTOMY/MICRODISCECTOMY OF L4-L5 for spinal stenosis, DECOMPRESSION OF L3-L4;  Surgeon: Latanya Maudlin, MD;  [facility];  Service: Orthopedics;  Laterality: Right;  . NASAL SINUS SURGERY Left 07/04/2017   Procedure: LEFT ENDOSCOPIC SINUS SURGERY;  Surgeon: Jerrell Belfast, MD;  Location: [facility];  Service: ENT;  Laterality: Left;  . RIGHT/LEFT HEART CATH AND CORONARY ANGIOGRAPHY N/A 06/21/2017   Procedure: RIGHT/LEFT HEART CATH AND CORONARY ANGIOGRAPHY;  Surgeon: Belva Crome, MD;  Location: [facility];  Service: Cardiovascular;  Laterality: N/A;  . ROTATOR CUFF REPAIR Right   . SHOULDER ARTHROSCOPY     x3, L & R  . SINUS ENDO WITH FUSION N/A 07/04/2017   Procedure: SINUS ENDO WITH FUSION;  Surgeon: Jerrell Belfast, MD;  Location: [facility];  Service: ENT;  Laterality: N/A;  . VIDEO ASSISTED THORACOSCOPY (VATS)/ LOBECTOMY Right 10/07/2016   Procedure: VIDEO ASSISTED THORACOSCOPY (VATS)/RIGHT MIDDLE LOBECTOMY;  Surgeon: [name], MD;  Location: [facility];  Service: Thoracic;  Laterality: Right;  [name] VIDEO BRONCHOSCOPY WITH ENDOBRONCHIAL NAVIGATION N/A 09/23/2016   Procedure: VIDEO BRONCHOSCOPY WITH ENDOBRONCHIAL NAVIGATION;  Surgeon: Modesto Charon  C, MD;  Location: [facility];  Service: Thoracic;  Laterality: N/A;  . VIDEO BRONCHOSCOPY WITH ENDOBRONCHIAL ULTRASOUND N/A 09/23/2016   Procedure: VIDEO BRONCHOSCOPY WITH ENDOBRONCHIAL ULTRASOUND;  Surgeon: [name], MD;  [facility];  Service: Thoracic;  Laterality: N/A;    Current Outpatient Medications  Medication Sig Dispense Refill  . albuterol (PROVENTIL HFA;VENTOLIN HFA) 108 (90 Base) MCG/ACT inhaler Inhale 2 puffs into the lungs every 6 (six) hours as needed for wheezing or shortness of breath. 1 Inhaler 11  . apixaban  (ELIQUIS) 5 MG TABS tablet Take 1 tablet (5 mg total) by mouth 2 (two) times daily. 180 tablet 3  . calcium carbonate (TUMS - DOSED IN MG ELEMENTAL CALCIUM) 500 MG chewable tablet Chew 1 tablet by mouth 3 (three) times daily as needed for indigestion or heartburn.     . diclofenac sodium (VOLTAREN) 1 % GEL Apply 1 application topically at bedtime.    . finasteride (PROSCAR) 5 MG tablet Take 5 mg by mouth at bedtime.     . fluticasone (FLONASE) 50 MCG/ACT nasal spray Use 2 sprays in each nostril daily as needed for allergies  11  . furosemide (LASIX) 20 MG tablet Take 1 tablet (20 mg total) by mouth daily. 90 tablet 3  . propafenone (RYTHMOL) 225 MG tablet Take 1 tablet (225 mg total) by mouth 2 (two) times daily. 60 tablet 11  . silodosin (RAPAFLO) 8 MG CAPS capsule Take 8 mg by mouth at bedtime.     . traMADol (ULTRAM) 50 MG tablet Take 50 mg by mouth every 6 (six) hours as needed for moderate pain.    [name] zolpidem (AMBIEN) 5 MG tablet TAKE 1 TABLET BY MOUTH AT BEDTIME AS NEEDED FOR SLEEP 30 tablet 2   No current facility-administered medications for this visit.     Physical Exam BP 114/72   Pulse 89   Resp 20   Ht 5\' 9"  (1.753 m)   Wt 152 lb (68.9 kg)   SpO2 95% Comment: RA  BMI 22.34 kg/m  84 year old man in no acute distress Alert and oriented x3 with no focal deficits No cervical or supraclavicular adenopathy Lungs clear with equal breath sounds bilaterally Cardiac regular rate and rhythm normal S1-S2 Extremities without clubbing cyanosis or edema  Diagnostic Tests: CT CHEST WITHOUT CONTRAST  TECHNIQUE: Multidetector CT imaging of the chest was performed using thin slice collimation for electromagnetic bronchoscopy planning purposes, without intravenous contrast.  COMPARISON:  PET-CT 10/04/2017 and chest CT 08/30/2017  FINDINGS: Cardiovascular: The heart is normal in size. Small amount of pericardial fluid. The aorta is normal in caliber. Stable minimal scattered  atherosclerotic calcifications. Stable coronary artery calcifications.  Mediastinum/Nodes: No enlarged mediastinal or hilar lymph nodes are identified.  Lungs/Pleura: Stable dense radiation fibrosis involving the right paramediastinal lung. No obvious recurrent tumor. Stable loss of volume in the right hemithorax.  Ground-glass nodule in the right lower lobe has enlarged. It measures 23.5 x 13.5 mm on image number 92 and previously measured 18.5 x 11 mm. Findings suspicious for adenocarcinoma.  No new pulmonary lesions or acute overlying pulmonary process.  Upper Abdomen: No significant upper abdominal findings. No obvious hepatic or adrenal gland lesions without contrast. Stable large upper pole right renal cyst.  Musculoskeletal: No chest wall mass, supraclavicular or axillary lymphadenopathy. The thyroid gland is grossly normal.  No significant bony findings.  IMPRESSION: 1. Slight interval enlargement of the right lower lobe ground-glass nodule. 2. Stable extensive radiation changes involving the right  paramediastinal lung. 3. No findings for upper abdominal metastatic disease or new lung lesions.  Aortic Atherosclerosis (ICD10-I70.0) and Emphysema (ICD10-J43.9).   Electronically Signed   By: Marijo Sanes M.D.   On: 12/09/2017 16:32  Impression: Mr. Musselman is an 84 year old gentleman with a history of a stage I adenocarcinoma of the right middle lobe treated with surgical resection followed by adjuvant radiation.  He now has an enlarging groundglass opacity in the right lower lobe that is highly suspicious for a new primary bronchogenic carcinoma.  I do not think he is a good surgical candidate, but he is a candidate for stereotactic radiation.  He has discussed this with radiation oncology and is interested in pursuing treatment.  I recommended that we proceed with electromagnetic navigational bronchoscopy for biopsy and fiducial placement.  I informed  him of the general nature of the procedure.  He understands this will be done endoscopically.  We will plan to do it under general anesthesia.  We will plan to do it on an outpatient basis.  He understands the risk include those associated with general anesthesia such as MI, stroke, DVT, PE, and in rare instances death.  The primary procedural risks are failure to make a diagnosis, pneumothorax, and a very small potential for significant bleeding.  He is on Eliquis and will need to hold that for 48 hours prior to the procedure.  Plan: Navigational bronchoscopy and fiducial placement on Thursday, 02/02/2018  [name], MD [facility] [phone]

## 2018-01-17 NOTE — Progress Notes (Signed)
[address]       [place] [address]             [phone]    HPI: Raymond Logan returns for follow-up regarding a right lower lobe lung nodule  Raymond Logan is an 84 year old gentleman who had a right middle lobectomy for a stage Ia adenocarcinoma in 2018.  His margin was close and he was treated with postoperative adjuvant radiation.  He has developed a groundglass opacity in the right lower lobe that has been slowly enlarging.  On his most recent CT in October it measured 2.3 x 1.3 cm.  It is highly suspicious for a new primary bronchogenic adenocarcinoma in situ.  He recently saw Dr. [name] to discuss possible stereotactic radiation.  He has decided to proceed with that and Dr. [name] would like a biopsy prior.  Past Medical History:  Diagnosis Date  . ALLERGIC RHINITIS 01/23/2007  . Arthritis    hands  . BENIGN PROSTATIC HYPERTROPHY 09/10/2006  . Chronic headaches   . Chronic sinusitis   . Complication of anesthesia    difficulty voiding- if cath needed needs to placed with need to be done with scope  . COPD, MILD 04/03/2010   patient denies on preop visit of 08/02/2014   . Difficulty in urination   . Dyspnea   . FATIGUE 01/08/2008  . GERD 04/15/2010  . H. pylori infection    hx of   . Headache(784.0) 09/10/2006  . HOARSENESS 04/15/2010  . HYPERLIPIDEMIA 09/10/2006  . INSOMNIA-SLEEP DISORDER-UNSPEC 04/24/2009  . Lung cancer ([facility])   . PAF (paroxysmal atrial fibrillation) ([place])    identified on EKG 04/08/17  . PEPTIC ULCER DISEASE 01/23/2007  . Pneumonia    1955   Past Surgical History:  Procedure Laterality Date  . CATARACT EXTRACTION Left   . COLONOSCOPY    . CYSTOSCOPY N/A 10/07/2016   Procedure: CYSTOSCOPY;  Surgeon: Festus Aloe, MD;  Location: [facility];  Service: Urology;  Laterality: N/A;  . HEMORRHOID BANDING    . INSERTION OF SUPRAPUBIC CATHETER N/A 10/07/2016   Procedure: FOLEY  CATHETER PLACEMENT;  Surgeon: Festus Aloe, MD;  Location: [facility];  Service: Urology;  Laterality: N/A;  . KNEE ARTHROSCOPY Left    torn meniscus  . LUMBAR LAMINECTOMY/DECOMPRESSION MICRODISCECTOMY Right 08/07/2014   Procedure: complete DECOMPRESSION LUMBAR LAMINECTOMY/MICRODISCECTOMY OF L4-L5 for spinal stenosis, DECOMPRESSION OF L3-L4;  Surgeon: Latanya Maudlin, MD;  [facility];  Service: Orthopedics;  Laterality: Right;  . NASAL SINUS SURGERY Left 07/04/2017   Procedure: LEFT ENDOSCOPIC SINUS SURGERY;  Surgeon: Jerrell Belfast, MD;  Location: [facility];  Service: ENT;  Laterality: Left;  . RIGHT/LEFT HEART CATH AND CORONARY ANGIOGRAPHY N/A 06/21/2017   Procedure: RIGHT/LEFT HEART CATH AND CORONARY ANGIOGRAPHY;  Surgeon: Belva Crome, MD;  Location: [facility];  Service: Cardiovascular;  Laterality: N/A;  . ROTATOR CUFF REPAIR Right   . SHOULDER ARTHROSCOPY     x3, L & R  . SINUS ENDO WITH FUSION N/A 07/04/2017   Procedure: SINUS ENDO WITH FUSION;  Surgeon: Jerrell Belfast, MD;  Location: [facility];  Service: ENT;  Laterality: N/A;  . VIDEO ASSISTED THORACOSCOPY (VATS)/ LOBECTOMY Right 10/07/2016   Procedure: VIDEO ASSISTED THORACOSCOPY (VATS)/RIGHT MIDDLE LOBECTOMY;  Surgeon: [name], MD;  Location: [facility];  Service: Thoracic;  Laterality: Right;  [name] VIDEO BRONCHOSCOPY WITH ENDOBRONCHIAL NAVIGATION N/A 09/23/2016   Procedure: VIDEO BRONCHOSCOPY WITH ENDOBRONCHIAL NAVIGATION;  Surgeon: Modesto Charon  C, MD;  Location: [facility];  Service: Thoracic;  Laterality: N/A;  . VIDEO BRONCHOSCOPY WITH ENDOBRONCHIAL ULTRASOUND N/A 09/23/2016   Procedure: VIDEO BRONCHOSCOPY WITH ENDOBRONCHIAL ULTRASOUND;  Surgeon: [name], MD;  [facility];  Service: Thoracic;  Laterality: N/A;    Current Outpatient Medications  Medication Sig Dispense Refill  . albuterol (PROVENTIL HFA;VENTOLIN HFA) 108 (90 Base) MCG/ACT inhaler Inhale 2 puffs into the lungs every 6 (six) hours as needed for wheezing or shortness of breath. 1 Inhaler 11  . apixaban  (ELIQUIS) 5 MG TABS tablet Take 1 tablet (5 mg total) by mouth 2 (two) times daily. 180 tablet 3  . calcium carbonate (TUMS - DOSED IN MG ELEMENTAL CALCIUM) 500 MG chewable tablet Chew 1 tablet by mouth 3 (three) times daily as needed for indigestion or heartburn.     . diclofenac sodium (VOLTAREN) 1 % GEL Apply 1 application topically at bedtime.    . finasteride (PROSCAR) 5 MG tablet Take 5 mg by mouth at bedtime.     . fluticasone (FLONASE) 50 MCG/ACT nasal spray Use 2 sprays in each nostril daily as needed for allergies  11  . furosemide (LASIX) 20 MG tablet Take 1 tablet (20 mg total) by mouth daily. 90 tablet 3  . propafenone (RYTHMOL) 225 MG tablet Take 1 tablet (225 mg total) by mouth 2 (two) times daily. 60 tablet 11  . silodosin (RAPAFLO) 8 MG CAPS capsule Take 8 mg by mouth at bedtime.     . traMADol (ULTRAM) 50 MG tablet Take 50 mg by mouth every 6 (six) hours as needed for moderate pain.    [name] zolpidem (AMBIEN) 5 MG tablet TAKE 1 TABLET BY MOUTH AT BEDTIME AS NEEDED FOR SLEEP 30 tablet 2   No current facility-administered medications for this visit.     Physical Exam BP 114/72   Pulse 89   Resp 20   Ht 5\' 9"  (1.753 m)   Wt 152 lb (68.9 kg)   SpO2 95% Comment: RA  BMI 22.54 kg/m  84 year old man in no acute distress Alert and oriented x3 with no focal deficits No cervical or supraclavicular adenopathy Lungs clear with equal breath sounds bilaterally Cardiac regular rate and rhythm normal S1-S2 Extremities without clubbing cyanosis or edema  Diagnostic Tests: CT CHEST WITHOUT CONTRAST  TECHNIQUE: Multidetector CT imaging of the chest was performed using thin slice collimation for electromagnetic bronchoscopy planning purposes, without intravenous contrast.  COMPARISON:  PET-CT 10/04/2017 and chest CT 08/30/2017  FINDINGS: Cardiovascular: The heart is normal in size. Small amount of pericardial fluid. The aorta is normal in caliber. Stable minimal scattered  atherosclerotic calcifications. Stable coronary artery calcifications.  Mediastinum/Nodes: No enlarged mediastinal or hilar lymph nodes are identified.  Lungs/Pleura: Stable dense radiation fibrosis involving the right paramediastinal lung. No obvious recurrent tumor. Stable loss of volume in the right hemithorax.  Ground-glass nodule in the right lower lobe has enlarged. It measures 23.5 x 13.5 mm on image number 92 and previously measured 18.5 x 11 mm. Findings suspicious for adenocarcinoma.  No new pulmonary lesions or acute overlying pulmonary process.  Upper Abdomen: No significant upper abdominal findings. No obvious hepatic or adrenal gland lesions without contrast. Stable large upper pole right renal cyst.  Musculoskeletal: No chest wall mass, supraclavicular or axillary lymphadenopathy. The thyroid gland is grossly normal.  No significant bony findings.  IMPRESSION: 1. Slight interval enlargement of the right lower lobe ground-glass nodule. 2. Stable extensive radiation changes involving the right  paramediastinal lung. 3. No findings for upper abdominal metastatic disease or new lung lesions.  Aortic Atherosclerosis (ICD10-I70.0) and Emphysema (ICD10-J43.9).   Electronically Signed   By: Marijo Sanes M.D.   On: 12/09/2017 16:32  Impression: Mr. Alcalde is an 84 year old gentleman with a history of a stage I adenocarcinoma of the right middle lobe treated with surgical resection followed by adjuvant radiation.  He now has an enlarging groundglass opacity in the right lower lobe that is highly suspicious for a new primary bronchogenic carcinoma.  I do not think he is a good surgical candidate, but he is a candidate for stereotactic radiation.  He has discussed this with radiation oncology and is interested in pursuing treatment.  I recommended that we proceed with electromagnetic navigational bronchoscopy for biopsy and fiducial placement.  I informed  him of the general nature of the procedure.  He understands this will be done endoscopically.  We will plan to do it under general anesthesia.  We will plan to do it on an outpatient basis.  He understands the risk include those associated with general anesthesia such as MI, stroke, DVT, PE, and in rare instances death.  The primary procedural risks are failure to make a diagnosis, pneumothorax, and a very small potential for significant bleeding.  He is on Eliquis and will need to hold that for 48 hours prior to the procedure.  Plan: Navigational bronchoscopy and fiducial placement on Thursday, 02/02/2018  [name], MD [facility] [phone]

## 2018-01-24 ENCOUNTER — Ambulatory Visit: Payer: Medicare Other | Admitting: Thoracic Surgery (Cardiothoracic Vascular Surgery)

## 2018-01-24 ENCOUNTER — Other Ambulatory Visit: Payer: Medicare Other

## 2018-02-01 ENCOUNTER — Encounter (HOSPITAL_COMMUNITY)
Admission: RE | Admit: 2018-02-01 | Discharge: 2018-02-01 | Disposition: A | Discharge status: Home / Self Care | Payer: Medicare Other | Source: Ambulatory Visit | Attending: Thoracic Surgery (Cardiothoracic Vascular Surgery) | Admitting: Thoracic Surgery (Cardiothoracic Vascular Surgery)

## 2018-02-01 ENCOUNTER — Encounter (HOSPITAL_COMMUNITY): Payer: Self-pay

## 2018-02-01 ENCOUNTER — Other Ambulatory Visit: Payer: Self-pay

## 2018-02-01 DIAGNOSIS — R911 Solitary pulmonary nodule: Secondary | ICD-10-CM

## 2018-02-01 DIAGNOSIS — Z01812 Encounter for preprocedural laboratory examination: Secondary | ICD-10-CM | POA: Diagnosis not present

## 2018-02-01 LAB — COMPREHENSIVE METABOLIC PANEL
ALBUMIN: 3.6 g/dL (ref 3.5–5.0)
ALT: 10 U/L (ref 0–44)
ANION GAP: 11 (ref 5–15)
AST: 15 U/L (ref 15–41)
Alkaline Phosphatase: 99 U/L (ref 38–126)
BUN: 20 mg/dL (ref 8–23)
CO2: 25 mmol/L (ref 22–32)
Calcium: 9.1 mg/dL (ref 8.9–10.3)
Chloride: 105 mmol/L (ref 98–111)
Creatinine, Ser: 1.19 mg/dL (ref 0.61–1.24)
GFR calc Af Amer: >60 mL/min (ref >60)
GFR calc non Af Amer: 55 mL/min — ABNORMAL LOW (ref >60)
GLUCOSE: 134 mg/dL — AB (ref 70–99)
POTASSIUM: 3.7 mmol/L (ref 3.5–5.1)
SODIUM: 141 mmol/L (ref 135–145)
Total Bilirubin: 0.2 mg/dL — ABNORMAL LOW (ref 0.3–1.2)
Total Protein: 7.3 g/dL (ref 6.5–8.1)

## 2018-02-01 LAB — CBC
HCT: 37.9 % — ABNORMAL LOW (ref 39.0–52.0)
Hemoglobin: 11.9 g/dL — ABNORMAL LOW (ref 13.0–17.0)
MCH: 29.2 pg (ref 26.0–34.0)
MCHC: 31.4 g/dL (ref 30.0–36.0)
MCV: 93.1 fL (ref 80.0–100.0)
PLATELETS: 281 10*3/uL (ref 150–400)
RBC: 4.07 MIL/uL — ABNORMAL LOW (ref 4.22–5.81)
RDW: 12.8 % (ref 11.5–15.5)
WBC: 6.2 10*3/uL (ref 4.0–10.5)
nRBC: 0 % (ref 0.0–0.2)

## 2018-02-01 LAB — APTT: APTT: 36 s (ref 24–36)

## 2018-02-01 LAB — PROTIME-INR
INR: 1.2
Prothrombin Time: 15.1 seconds (ref 11.4–15.2)

## 2018-02-01 NOTE — Pre-Procedure Instructions (Signed)
Raymond Logan  02/01/2018      [id number] [place], [place] AT [facility] [address] [place] [address] Phone: [phone] Fax: [phone]    Your procedure is scheduled on Monday December 9th.  Report to [facility] Admitting at 0900 A.M.  Call this number if you have problems the morning of surgery:  [phone]   Remember:  Do not eat or drink after midnight.   Take these medicines the morning of surgery with A SIP OF WATER  albuterol (PROVENTIL HFA;VENTOLIN HFA) if needed. Bring with you to hospital fluticasone [facility]) If needed propafenone (RYTHMOL)  Hold your Eliquis 48 hours prior to surgery per Dr. Blase Mess.  7 days prior to surgery STOP taking any Aspirin(unless otherwise instructed by your surgeon), Aleve, Naproxen, Ibuprofen, Motrin, Advil, Goody's, BC's, all herbal medications, fish oil, and all vitamins     Do not wear jewelry.  Do not wear lotions, powders, or colognes, or deodorant.  Men may shave face and neck.  Do not bring valuables to the hospital.  [facility] is not responsible for any belongings or valuables.  Contacts, dentures or bridgework may not be worn into surgery.  Leave your suitcase in the car.  After surgery it may be brought to your room.  For patients admitted to the hospital, discharge time will be determined by your treatment team.  Patients discharged the day of surgery will not be allowed to drive home.    [facility]- Preparing For Surgery  Before surgery, you can play an important role. Because skin is not sterile, your skin needs to be as free of germs as possible. You can reduce the number of germs on your skin by washing with CHG (chlorahexidine gluconate) Soap before surgery.  CHG is an antiseptic cleaner which kills germs and bonds with the skin to continue killing germs even after washing.    Oral Hygiene is also important to reduce your  risk of infection.  Remember - BRUSH YOUR TEETH THE MORNING OF SURGERY WITH YOUR REGULAR TOOTHPASTE  Please do not use if you have an allergy to CHG or antibacterial soaps. If your skin becomes reddened/irritated stop using the CHG.  Do not shave (including legs and underarms) for at least 48 hours prior to first CHG shower. It is OK to shave your face.  Please follow these instructions carefully.   1. Shower the NIGHT BEFORE SURGERY and the MORNING OF SURGERY with CHG.   2. If you chose to wash your hair, wash your hair first as usual with your normal shampoo.  3. After you shampoo, rinse your hair and body thoroughly to remove the shampoo.  4. Use CHG as you would any other liquid soap. You can apply CHG directly to the skin and wash gently with a scrungie or a clean washcloth.   5. Apply the CHG Soap to your body ONLY FROM THE NECK DOWN.  Do not use on open wounds or open sores. Avoid contact with your eyes, ears, mouth and genitals (private parts). Wash Face and genitals (private parts)  with your normal soap.  6. Wash thoroughly, paying special attention to the area where your surgery will be performed.  7. Thoroughly rinse your body with warm water from the neck down.  8. DO NOT shower/wash with your normal soap after using and rinsing off the CHG Soap.  9. Pat yourself dry  with a CLEAN TOWEL.  10. Wear CLEAN PAJAMAS to bed the night before surgery, wear comfortable clothes the morning of surgery  11. Place CLEAN SHEETS on your bed the night of your first shower and DO NOT SLEEP WITH PETS.    Day of Surgery:  Do not apply any deodorants/lotions.  Please wear clean clothes to the hospital/surgery center.   Remember to brush your teeth WITH YOUR REGULAR TOOTHPASTE.    Please read over the following fact sheets that you were given.

## 2018-02-01 NOTE — Progress Notes (Signed)
PCP -  Cathlean Cower MD Cardiologist -  Cristopher Peru Md  Chest x-ray -  DOS EKG - 11/02/17 Stress Test - 2019 ECHO - 2019 Cardiac Cath -  2019   Blood Thinner Instructions: Hold Eliquis 48 hours prior per Dr. Roxan Hockey Aspirin Instructions: N/A  Anesthesia review: Cardiac hx  Patient denies shortness of breath, fever, cough and chest pain at PAT appointment   Patient verbalized understanding of instructions that were given to them at the PAT appointment. Patient was also instructed that they will need to review over the PAT instructions again at home before surgery.

## 2018-02-02 NOTE — Anesthesia Preprocedure Evaluation (Addendum)
Anesthesia Evaluation  Patient identified by MRN, date of birth, ID band Patient awake    Reviewed: Allergy & Precautions, NPO status , Patient's Chart, lab work & pertinent test results  History of Anesthesia Complications Negative for: history of anesthetic complications  Airway Mallampati: II  TM Distance: >3 FB Neck ROM: Full    Dental  (+) Teeth Intact, Missing, Chipped,    Pulmonary shortness of breath, COPD, former smoker,  Lung ca   breath sounds clear to auscultation       Cardiovascular + DOE  + dysrhythmias Atrial Fibrillation  Rhythm:Regular     Neuro/Psych  Headaches, neg Seizures negative psych ROS   GI/Hepatic Neg liver ROS, PUD, GERD  Controlled,  Endo/Other  negative endocrine ROS  Renal/GU negative Renal ROS     Musculoskeletal  (+) Arthritis ,   Abdominal   Peds  Hematology  (+) anemia ,   Anesthesia Other Findings   Reproductive/Obstetrics                           Anesthesia Physical Anesthesia Plan  ASA: III  Anesthesia Plan: General   Post-op Pain Management:    Induction: Intravenous  PONV Risk Score and Plan: 2 and Ondansetron and Dexamethasone  Airway Management Planned: Oral ETT  Additional Equipment: None  Intra-op Plan:   Post-operative Plan: Extubation in OR  Informed Consent: I have reviewed the patients History and Physical, chart, labs and discussed the procedure including the risks, benefits and alternatives for the proposed anesthesia with the patient or authorized representative who has indicated his/her understanding and acceptance.   Dental advisory given  Plan Discussed with: CRNA and Surgeon  Anesthesia Plan Comments: (Follows with cardiology, Dr. Lovena Le for PAF. Per last OV 11/02/17 he is maintaining NSR on propafenone. Abnormal stress test 05/2017, subsequent cardiac cath showed it was a false positive test, no CAD. Low normal LV  systolic function, EF at least 50%.)       Anesthesia Quick Evaluation

## 2018-02-06 ENCOUNTER — Ambulatory Visit (HOSPITAL_COMMUNITY): Payer: Medicare Other | Admitting: Certified Registered"

## 2018-02-06 ENCOUNTER — Ambulatory Visit (HOSPITAL_COMMUNITY): Payer: Medicare Other

## 2018-02-06 ENCOUNTER — Encounter (HOSPITAL_COMMUNITY)
Admission: RE | Disposition: A | Discharge status: Home / Self Care | Payer: Self-pay | Source: Ambulatory Visit | Attending: Thoracic Surgery (Cardiothoracic Vascular Surgery)

## 2018-02-06 ENCOUNTER — Other Ambulatory Visit: Payer: Self-pay

## 2018-02-06 ENCOUNTER — Ambulatory Visit (HOSPITAL_COMMUNITY): Payer: Medicare Other | Admitting: Physician Assistant

## 2018-02-06 ENCOUNTER — Encounter (HOSPITAL_COMMUNITY): Payer: Self-pay | Admitting: General Practice

## 2018-02-06 ENCOUNTER — Ambulatory Visit (HOSPITAL_COMMUNITY)
Admission: RE | Admit: 2018-02-06 | Discharge: 2018-02-06 | Disposition: A | Discharge status: Home / Self Care | Payer: Medicare Other | Source: Ambulatory Visit | Attending: Thoracic Surgery (Cardiothoracic Vascular Surgery) | Admitting: Thoracic Surgery (Cardiothoracic Vascular Surgery)

## 2018-02-06 DIAGNOSIS — Z85118 Personal history of other malignant neoplasm of bronchus and lung: Secondary | ICD-10-CM | POA: Diagnosis not present

## 2018-02-06 DIAGNOSIS — Z791 Long term (current) use of non-steroidal anti-inflammatories (NSAID): Secondary | ICD-10-CM | POA: Insufficient documentation

## 2018-02-06 DIAGNOSIS — M19042 Primary osteoarthritis, left hand: Secondary | ICD-10-CM | POA: Diagnosis not present

## 2018-02-06 DIAGNOSIS — I480 Paroxysmal atrial fibrillation: Secondary | ICD-10-CM | POA: Insufficient documentation

## 2018-02-06 DIAGNOSIS — J449 Chronic obstructive pulmonary disease, unspecified: Secondary | ICD-10-CM | POA: Diagnosis not present

## 2018-02-06 DIAGNOSIS — Z7901 Long term (current) use of anticoagulants: Secondary | ICD-10-CM | POA: Diagnosis not present

## 2018-02-06 DIAGNOSIS — G4700 Insomnia, unspecified: Secondary | ICD-10-CM | POA: Diagnosis not present

## 2018-02-06 DIAGNOSIS — R079 Chest pain, unspecified: Secondary | ICD-10-CM | POA: Diagnosis not present

## 2018-02-06 DIAGNOSIS — K219 Gastro-esophageal reflux disease without esophagitis: Secondary | ICD-10-CM | POA: Diagnosis not present

## 2018-02-06 DIAGNOSIS — Z419 Encounter for procedure for purposes other than remedying health state, unspecified: Secondary | ICD-10-CM

## 2018-02-06 DIAGNOSIS — Z87891 Personal history of nicotine dependence: Secondary | ICD-10-CM | POA: Insufficient documentation

## 2018-02-06 DIAGNOSIS — R911 Solitary pulmonary nodule: Secondary | ICD-10-CM | POA: Diagnosis not present

## 2018-02-06 DIAGNOSIS — J90 Pleural effusion, not elsewhere classified: Secondary | ICD-10-CM | POA: Insufficient documentation

## 2018-02-06 DIAGNOSIS — C3431 Malignant neoplasm of lower lobe, right bronchus or lung: Secondary | ICD-10-CM | POA: Diagnosis not present

## 2018-02-06 DIAGNOSIS — M19041 Primary osteoarthritis, right hand: Secondary | ICD-10-CM | POA: Insufficient documentation

## 2018-02-06 DIAGNOSIS — Z923 Personal history of irradiation: Secondary | ICD-10-CM | POA: Insufficient documentation

## 2018-02-06 DIAGNOSIS — E785 Hyperlipidemia, unspecified: Secondary | ICD-10-CM | POA: Diagnosis not present

## 2018-02-06 DIAGNOSIS — Z79899 Other long term (current) drug therapy: Secondary | ICD-10-CM | POA: Insufficient documentation

## 2018-02-06 HISTORY — PX: FUDUCIAL PLACEMENT: SHX5083

## 2018-02-06 HISTORY — PX: VIDEO BRONCHOSCOPY WITH ENDOBRONCHIAL NAVIGATION: SHX6175

## 2018-02-06 LAB — PROTIME-INR
INR: 1.1
Prothrombin Time: 14.1 seconds (ref 11.4–15.2)

## 2018-02-06 SURGERY — VIDEO BRONCHOSCOPY WITH ENDOBRONCHIAL NAVIGATION
Anesthesia: General | Laterality: Right

## 2018-02-06 MED ORDER — FENTANYL CITRATE (PF) 100 MCG/2ML IJ SOLN
INTRAMUSCULAR | Status: DC | PRN
  Administered 2018-02-06 (×2): 50 ug via INTRAVENOUS

## 2018-02-06 MED ORDER — PROPOFOL 10 MG/ML IV BOLUS
INTRAVENOUS | Status: DC | PRN
  Administered 2018-02-06: 80 mg via INTRAVENOUS

## 2018-02-06 MED ORDER — ROCURONIUM BROMIDE 50 MG/5ML IV SOSY
PREFILLED_SYRINGE | INTRAVENOUS | Status: AC
  Filled 2018-02-06: qty 5

## 2018-02-06 MED ORDER — ONDANSETRON HCL 4 MG/2ML IJ SOLN
INTRAMUSCULAR | Status: DC | PRN
  Administered 2018-02-06: 4 mg via INTRAVENOUS

## 2018-02-06 MED ORDER — SODIUM CHLORIDE 0.9 % IV SOLN
INTRAVENOUS | Status: DC | PRN
  Administered 2018-02-06: 15 ug/min via INTRAVENOUS

## 2018-02-06 MED ORDER — DEXAMETHASONE SODIUM PHOSPHATE 10 MG/ML IJ SOLN
INTRAMUSCULAR | Status: AC
  Filled 2018-02-06: qty 1

## 2018-02-06 MED ORDER — DEXAMETHASONE SODIUM PHOSPHATE 10 MG/ML IJ SOLN
INTRAMUSCULAR | Status: DC | PRN
  Administered 2018-02-06: 10 mg via INTRAVENOUS

## 2018-02-06 MED ORDER — ONDANSETRON HCL 4 MG/2ML IJ SOLN
INTRAMUSCULAR | Status: AC
  Filled 2018-02-06: qty 2

## 2018-02-06 MED ORDER — LIDOCAINE 2% (20 MG/ML) 5 ML SYRINGE
INTRAMUSCULAR | Status: DC | PRN
  Administered 2018-02-06: 40 mg via INTRAVENOUS

## 2018-02-06 MED ORDER — PROPOFOL 10 MG/ML IV BOLUS
INTRAVENOUS | Status: AC
  Filled 2018-02-06: qty 20

## 2018-02-06 MED ORDER — FENTANYL CITRATE (PF) 100 MCG/2ML IJ SOLN: 25.0000 ug | INTRAMUSCULAR | Status: DC | PRN

## 2018-02-06 MED ORDER — ROCURONIUM BROMIDE 50 MG/5ML IV SOSY
PREFILLED_SYRINGE | INTRAVENOUS | Status: DC | PRN
  Administered 2018-02-06: 40 mg via INTRAVENOUS

## 2018-02-06 MED ORDER — FENTANYL CITRATE (PF) 250 MCG/5ML IJ SOLN
INTRAMUSCULAR | Status: AC
  Filled 2018-02-06: qty 5

## 2018-02-06 MED ORDER — LACTATED RINGERS IV SOLN
INTRAVENOUS | Status: DC
  Administered 2018-02-06 (×2): via INTRAVENOUS

## 2018-02-06 MED ORDER — PHENYLEPHRINE HCL 10 MG/ML IJ SOLN
INTRAMUSCULAR | Status: DC | PRN
  Administered 2018-02-06: 120 ug via INTRAVENOUS
  Administered 2018-02-06: 160 ug via INTRAVENOUS
  Administered 2018-02-06: 120 ug via INTRAVENOUS

## 2018-02-06 MED ORDER — 0.9 % SODIUM CHLORIDE (POUR BTL) OPTIME
TOPICAL | Status: DC | PRN
  Administered 2018-02-06: 1000 mL

## 2018-02-06 MED ORDER — EPINEPHRINE PF 1 MG/ML IJ SOLN
INTRAMUSCULAR | Status: AC
  Filled 2018-02-06: qty 1

## 2018-02-06 MED ORDER — SUGAMMADEX SODIUM 200 MG/2ML IV SOLN
INTRAVENOUS | Status: DC | PRN
  Administered 2018-02-06: 140 mg via INTRAVENOUS

## 2018-02-06 SURGICAL SUPPLY — 51 items
ADAPTER BRONCHOSCOPE OLYMPUS (ADAPTER) ×4 IMPLANT
ADAPTER VALVE BIOPSY EBUS (MISCELLANEOUS) IMPLANT
ADPR BSCP OLMPS EDG (ADAPTER) ×2
ADPTR VALVE BIOPSY EBUS (MISCELLANEOUS) ×4
BRUSH BIOPSY BRONCH 10 SDTNB (MISCELLANEOUS) IMPLANT
BRUSH BIOPSY BRONCH 10MM SDTNB (MISCELLANEOUS)
BRUSH SUPERTRAX BIOPSY (INSTRUMENTS) ×2 IMPLANT
BRUSH SUPERTRAX NDL-TIP CYTO (INSTRUMENTS) ×4 IMPLANT
CANISTER SUCT 3000ML PPV (MISCELLANEOUS) ×4 IMPLANT
CHANNEL WORK EXTEND EDGE 180 (KITS) IMPLANT
CHANNEL WORK EXTEND EDGE 90 (KITS) IMPLANT
CONT SPEC 4OZ CLIKSEAL STRL BL (MISCELLANEOUS) ×8 IMPLANT
COVER BACK TABLE 60X90IN (DRAPES) ×4 IMPLANT
COVER WAND RF STERILE (DRAPES) ×4 IMPLANT
FILTER STRAW FLUID ASPIR (MISCELLANEOUS) IMPLANT
FORCEPS BIOP SUPERTRX PREMAR (INSTRUMENTS) ×2 IMPLANT
GAUZE SPONGE 4X4 12PLY STRL (GAUZE/BANDAGES/DRESSINGS) ×4 IMPLANT
GLOVE BIO SURGEON STRL SZ 6.5 (GLOVE) ×1 IMPLANT
GLOVE BIO SURGEONS STRL SZ 6.5 (GLOVE) ×1
GLOVE BIOGEL PI IND STRL 6.5 (GLOVE) IMPLANT
GLOVE BIOGEL PI INDICATOR 6.5 (GLOVE) ×2
GLOVE SURG SIGNA 7.5 PF LTX (GLOVE) ×4 IMPLANT
GOWN STRL REUS W/ TWL XL LVL3 (GOWN DISPOSABLE) ×2 IMPLANT
GOWN STRL REUS W/TWL XL LVL3 (GOWN DISPOSABLE) ×4
KIT CLEAN ENDO COMPLIANCE (KITS) ×4 IMPLANT
KIT MARKER FIDUCIAL DELIVERY (KITS) ×2 IMPLANT
KIT PROCEDURE EDGE 180 (KITS) ×2 IMPLANT
KIT PROCEDURE EDGE 90 (KITS) IMPLANT
KIT TURNOVER KIT B (KITS) ×4 IMPLANT
MARKER FIDUCIAL SL NIT COIL (Implant Marker) ×6 IMPLANT
MARKER SKIN DUAL TIP RULER LAB (MISCELLANEOUS) ×4 IMPLANT
NDL SUPERTRX PREMARK BIOPSY (NEEDLE) IMPLANT
NEEDLE SUPERTRX PREMARK BIOPSY (NEEDLE) ×4 IMPLANT
NS IRRIG 1000ML POUR BTL (IV SOLUTION) ×4 IMPLANT
OIL SILICONE PENTAX (PARTS (SERVICE/REPAIRS)) ×4 IMPLANT
PAD ARMBOARD 7.5X6 YLW CONV (MISCELLANEOUS) ×8 IMPLANT
PATCHES PATIENT (LABEL) ×12 IMPLANT
SYR 20CC LL (SYRINGE) ×6 IMPLANT
SYR 20ML ECCENTRIC (SYRINGE) ×4 IMPLANT
SYR 30ML LL (SYRINGE) ×4 IMPLANT
SYR 5ML LL (SYRINGE) ×4 IMPLANT
TOWEL GREEN STERILE (TOWEL DISPOSABLE) ×4 IMPLANT
TOWEL GREEN STERILE FF (TOWEL DISPOSABLE) ×4 IMPLANT
TRAP SPECIMEN MUCOUS 40CC (MISCELLANEOUS) ×4 IMPLANT
TUBE CONNECTING 20'X1/4 (TUBING) ×2
TUBE CONNECTING 20X1/4 (TUBING) ×6 IMPLANT
UNDERPAD 30X30 (UNDERPADS AND DIAPERS) ×4 IMPLANT
VALVE BIOPSY  SINGLE USE (MISCELLANEOUS) ×2
VALVE BIOPSY SINGLE USE (MISCELLANEOUS) ×2 IMPLANT
VALVE SUCTION BRONCHIO DISP (MISCELLANEOUS) ×4 IMPLANT
WATER STERILE IRR 1000ML POUR (IV SOLUTION) ×4 IMPLANT

## 2018-02-06 NOTE — Op Note (Signed)
NAME: Raymond Logan, Raymond Logan MEDICAL RECORD OM:[id number] ACCOUNT [id number] DATE OF BIRTH:1930/11/12 FACILITY: [facility] LOCATION: [facility] PHYSICIAN:Lucillie Kiesel Chaya Jan, MD  OPERATIVE REPORT  DATE OF PROCEDURE:  02/06/2018  PREOPERATIVE DIAGNOSIS:  Right lower lobe lung nodule.  POSTOPERATIVE DIAGNOSIS:  Nonsmall-cell carcinoma, right lower lobe, clinical stage IA (T1, N0).  PROCEDURE:  Electromagnetic navigational bronchoscopy with brushings, transbronchial biopsies, and fiducial placement.  SURGEON:  Modesto Charon, MD  ASSISTANT:  None.  ANESTHESIA:  General.  FINDINGS:  Navigated to within a centimeter of the center of the nodule with good alignment.  Initial brushings showed nonsmall-cell carcinoma, favor adenocarcinoma.  Fiducials were placed as directed by the computer program.  CLINICAL NOTE:  [name] is an 84 year old gentleman with a history of stage IB cancer of the right middle lobe.  He has been followed with CT scans and has had a slowly enlarging ground-glass opacity in the right lower lobe suspicious for a new  primary bronchogenic carcinoma.  He was advised to undergo navigational bronchoscopy for biopsy and fiducial placement.  The indications, risks, benefits, and alternatives were discussed in detail with the patient.  He understood and accepted the risks  and agreed to proceed.  OPERATIVE NOTE:  Preoperative planning was done on the superDimension system.  The patient was brought to the operating room and was anesthetized and intubated.  A timeout was performed.  Flexible fiberoptic bronchoscopy was then performed via the  endotracheal tube.  The trachea and left-sided airways were normal.  On the right side, the right main stem and right upper lobe bronchus were normal.  The right middle lobe bronchial stump was well healed with no abnormality.  There was some deformity  of the bronchus intermedius just before the takeoff of the right lower lobe airways, but  no tumor was seen.  The locatable guide for navigation was placed.  Registration was performed.  The bronchoscope then was directed to the right lower lobe orifice,  and the appropriate subsegmental airway was cannulated with the locatable guide, which was advanced to within a centimeter of the center of the nodule with good alignment.  Fluoroscopy was performed.  It showed good proximity to the nodule.  Needle  brushings were performed after each second sampling.  The locatable guide was replaced, and the catheter repositioned if necessary.  A total of 5 brushings were performed.  All sampling was done with fluoroscopy.  The total fluoroscopy time was 4.6  minutes.  While the slides were being prepped, multiple biopsies were obtained.  Again, the locatable guide was periodically replaced and the catheter repositioned as needed.  The Quick stains showed nonsmall-cell carcinoma.  The fiducial placement mode  then was begun on the computer, and after the sites were selected for fiducials, they were placed at the 3 sites as suggested.  Final inspection was made with the fluoroscope.  There was no evidence of pneumothorax.  The patient was extubated in the  operating room and taken to the [facility] in good condition.  LN/NUANCE  D:02/06/2018 T:02/06/2018 JOB:[id number]

## 2018-02-06 NOTE — Transfer of Care (Signed)
Immediate Anesthesia Transfer of Care Note  Patient: Raymond Logan  Procedure(s) Performed: VIDEO BRONCHOSCOPY WITH ENDOBRONCHIAL NAVIGATION (N/A ) PLACEMENT OF FIDUCIAL RIGHT LOWER LOBE LUNG (Right )  Patient Location: PACU  Anesthesia Type:General  Level of Consciousness: drowsy and patient cooperative  Airway & Oxygen Therapy: Patient Spontanous Breathing  Post-op Assessment: Report given to RN and Post -op Vital signs reviewed and stable  Post vital signs: Reviewed and stable  Last Vitals:  Vitals Value Taken Time  BP    Temp    Pulse 90 02/06/2018  3:51 PM  Resp 16 02/06/2018  3:51 PM  SpO2 100 % 02/06/2018  3:51 PM  Vitals shown include unvalidated device data.  Last Pain:  Vitals:   02/06/18 1117  TempSrc: Oral         Complications: No apparent anesthesia complications

## 2018-02-06 NOTE — Discharge Instructions (Addendum)
Do not drive or engage in heavy physical activity for 24 hours  You may resume normal activities tomorrow  You may cough up small amounts of blood over the next few days  Call [phone] if you develop chest pain, shortness of breath or cough up more than a tablespoon of blood  Follow up with Dr. [name] as scheduled

## 2018-02-06 NOTE — Anesthesia Postprocedure Evaluation (Signed)
Anesthesia Post Note  Patient: Raymond Logan  Procedure(s) Performed: VIDEO BRONCHOSCOPY WITH ENDOBRONCHIAL NAVIGATION (N/A ) PLACEMENT OF FIDUCIAL RIGHT LOWER LOBE LUNG (Right )     Patient location during evaluation: PACU Anesthesia Type: General Level of consciousness: awake and alert Pain management: pain level controlled Vital Signs Assessment: post-procedure vital signs reviewed and stable Respiratory status: spontaneous breathing, nonlabored ventilation and respiratory function stable Cardiovascular status: blood pressure returned to baseline and stable Postop Assessment: no apparent nausea or vomiting Anesthetic complications: no    Last Vitals:  Vitals:   02/06/18 1609 02/06/18 1619  BP: 121/69 135/70  Pulse: 92 85  Resp: 15 12  Temp:  36.6 C  SpO2: 100% 98%    Last Pain:  Vitals:   02/06/18 1619  TempSrc:   PainSc: 0-No pain                 Shneur Whittenburg,W. EDMOND

## 2018-02-06 NOTE — Anesthesia Procedure Notes (Signed)
Procedure Name: Intubation Date/Time: 02/06/2018 2:39 PM Performed by: Lance Coon, CRNA Pre-anesthesia Checklist: Patient identified, Emergency Drugs available, Suction available, Patient being monitored and Timeout performed Patient Re-evaluated:Patient Re-evaluated prior to induction Oxygen Delivery Method: Circle system utilized Preoxygenation: Pre-oxygenation with 100% oxygen Induction Type: IV induction Ventilation: Mask ventilation without difficulty Laryngoscope Size: Miller and 2 Grade View: Grade I Tube type: Oral Tube size: 8.5 mm Number of attempts: 1 Airway Equipment and Method: Stylet Placement Confirmation: ETT inserted through vocal cords under direct vision,  positive ETCO2 and breath sounds checked- equal and bilateral Secured at: 21 cm Tube secured with: Tape Dental Injury: Teeth and Oropharynx as per pre-operative assessment

## 2018-02-06 NOTE — Interval H&P Note (Signed)
History and Physical Interval Note:  02/06/2018 1:58 PM  Raymond Logan  has presented today for surgery, with the diagnosis of RLL LUNG NODULE  The various methods of treatment have been discussed with the patient and family. After consideration of risks, benefits and other options for treatment, the patient has consented to  Procedure(s): [place] (N/A) PLACEMENT OF FUDUCIAL (N/A) as a surgical intervention .  The patient's history has been reviewed, patient examined, no change in status, stable for surgery.  I have reviewed the patient's chart and labs.  Questions were answered to the patient's satisfaction.     [name]

## 2018-02-06 NOTE — Brief Op Note (Signed)
02/06/2018  3:57 PM  PATIENT:  Raymond Logan  84 y.o. male  PRE-OPERATIVE DIAGNOSIS:  RLL LUNG NODULE  POST-OPERATIVE DIAGNOSIS:  ADENOCARCINOMA RIGHT LOWER LOBE  PROCEDURE:  Procedure(s): VIDEO BRONCHOSCOPY WITH ENDOBRONCHIAL NAVIGATION (N/A) PLACEMENT OF FIDUCIAL RIGHT LOWER LOBE LUNG (Right)  Brushings and transbronchial biopsies  SURGEON:  Surgeon(s) and Role:    * [name], MD - Primary  PHYSICIAN ASSISTANT:   ASSISTANTS: none   ANESTHESIA:   general  EBL:  1 mL   BLOOD ADMINISTERED:none  DRAINS: none   LOCAL MEDICATIONS USED:  NONE  SPECIMEN:  Source of Specimen:  RLL nodule  DISPOSITION OF SPECIMEN:  PATHOLOGY  COUNTS:  NO endoscopic  TOURNIQUET:  * No tourniquets in log *  DICTATION: .Other Dictation: Dictation Number -  PLAN OF CARE: Discharge to home after PACU  PATIENT DISPOSITION:  PACU - hemodynamically stable.   Delay start of Pharmacological VTE agent (>24hrs) due to surgical blood loss or risk of bleeding: not applicable

## 2018-02-07 ENCOUNTER — Encounter (HOSPITAL_COMMUNITY): Payer: Self-pay | Admitting: Thoracic Surgery (Cardiothoracic Vascular Surgery)

## 2018-02-07 ENCOUNTER — Ambulatory Visit: Admission: RE | Admit: 2018-02-07 | Payer: Medicare Other | Source: Ambulatory Visit | Admitting: Radiation Oncology

## 2018-02-08 ENCOUNTER — Telehealth: Payer: Self-pay | Admitting: Radiation Oncology

## 2018-02-08 NOTE — Telephone Encounter (Signed)
I called the patient to let him know his biopsy results and we would move forward with simulation on Friday.

## 2018-02-10 ENCOUNTER — Ambulatory Visit
Admission: RE | Admit: 2018-02-10 | Discharge: 2018-02-10 | Disposition: A | Discharge status: Home / Self Care | Payer: Medicare Other | Source: Ambulatory Visit | Attending: Radiation Oncology | Admitting: Radiation Oncology

## 2018-02-10 DIAGNOSIS — C342 Malignant neoplasm of middle lobe, bronchus or lung: Secondary | ICD-10-CM | POA: Insufficient documentation

## 2018-02-10 DIAGNOSIS — C3431 Malignant neoplasm of lower lobe, right bronchus or lung: Secondary | ICD-10-CM | POA: Diagnosis not present

## 2018-02-10 DIAGNOSIS — Z510 Encounter for antineoplastic radiation therapy: Secondary | ICD-10-CM | POA: Insufficient documentation

## 2018-02-13 NOTE — Progress Notes (Signed)
[facility] Simulation and Treatment Planning Note   Name:  Raymond Logan MRN: [id number]   Date: 02/13/2018  DOB: [birth year]  Status:outpatient    DIAGNOSIS:    ICD-10-CM   1. Malignant neoplasm of middle lobe of right lung ([place]) C34.2      CONSENT VERIFIED:yes   SET UP: Patient is setup supine   IMMOBILIZATION: The patient was immobilized using a customized Vac Loc bag/ blue bag and customized accuform device   NARRATIVE:The patient was brought to the [facility] suite.  Identity was confirmed.  All relevant records and images related to the planned course of therapy were reviewed.  Then, the patient was positioned in a stable reproducible clinical set-up for radiation therapy. Abdominal compression was applied by me.  4D CT images were obtained and reproducible breathing pattern was confirmed. Free breathing CT images were obtained.  Skin markings were placed.  The CT images were loaded into the planning software where the target and avoidance structures were contoured.  The radiation prescription was entered and confirmed.    TREATMENT PLANNING NOTE:  Treatment planning then occurred. I have requested : IMRT planning.This treatment technique is medically necessary due to the high-dose of radiation delivered to the target region which is in close proximity to adjacent critical normal structures.  3 dimensional simulation is performed and dose volume histogram of the gross tumor volume, planning tumor volume and criticial normal structures including the spinal cord and lungs were analyzed and requested.  Special treatment procedure was performed due to high dose per fraction.  The patient will be monitored for increased risk of toxicity.  Daily imaging using cone beam CT will be used for target localization.  I anticipate that the patient will receive 54 Gy in 3 fractions to target volume. Further adjustments will be made based on  the planning process is necessary.  ------------------------------------------------  Jodelle Gross, MD, PhD

## 2018-02-16 ENCOUNTER — Telehealth: Payer: Self-pay | Admitting: Licensed Clinical Social Worker

## 2018-02-16 NOTE — Telephone Encounter (Signed)
Palliative Care SW spoke with patient and scheduled a visit for Tues., 02/21/18, at 11:30 am.

## 2018-02-17 DIAGNOSIS — Z510 Encounter for antineoplastic radiation therapy: Secondary | ICD-10-CM | POA: Diagnosis not present

## 2018-02-17 DIAGNOSIS — C342 Malignant neoplasm of middle lobe, bronchus or lung: Secondary | ICD-10-CM | POA: Diagnosis not present

## 2018-02-17 DIAGNOSIS — C3431 Malignant neoplasm of lower lobe, right bronchus or lung: Secondary | ICD-10-CM | POA: Diagnosis not present

## 2018-02-20 DIAGNOSIS — C7492 Malignant neoplasm of unspecified part of left adrenal gland: Secondary | ICD-10-CM | POA: Diagnosis not present

## 2018-02-21 ENCOUNTER — Other Ambulatory Visit: Payer: Medicare Other | Admitting: Licensed Clinical Social Worker

## 2018-02-21 DIAGNOSIS — Z515 Encounter for palliative care: Secondary | ICD-10-CM

## 2018-02-24 ENCOUNTER — Ambulatory Visit
Admission: RE | Admit: 2018-02-24 | Discharge: 2018-02-24 | Disposition: A | Discharge status: Home / Self Care | Payer: Medicare Other | Source: Ambulatory Visit | Attending: Radiation Oncology | Admitting: Radiation Oncology

## 2018-02-24 DIAGNOSIS — C342 Malignant neoplasm of middle lobe, bronchus or lung: Secondary | ICD-10-CM | POA: Diagnosis not present

## 2018-02-24 DIAGNOSIS — Z510 Encounter for antineoplastic radiation therapy: Secondary | ICD-10-CM | POA: Diagnosis not present

## 2018-02-24 NOTE — Progress Notes (Signed)
COMMUNITY PALLIATIVE CARE SW NOTE  PATIENT NAME: GEOFFRY BANNISTER DOB: 03/25/1930 MRN: [id number]  PRIMARY CARE PROVIDER: Biagio Borg, MD  RESPONSIBLE PARTY:  Acct ID - Guarantor Home Phone Work Phone Relationship Acct Type  [id number] [name]  Self P/F     [address], [place] [address]    PLAN OF CARE and INTERVENTIONS:             1. GOALS OF CARE/ ADVANCE CARE PLANNING:  Patient wishes to remain at home with his wife, [name].  He is a full code. 2. SOCIAL/EMOTIONAL/SPIRITUAL ASSESSMENT/ INTERVENTIONS:  SW met with patient in his home.  He stated his wife had a stroke a few days ago and is in the hospital.  He told the same story several times about the event.  When SW inquired about the future, he stated he did not wish to think about it.  Patient appeared to understand that the stroke is effecting the right side of her body.  She smiled at him during one of his visits and he wasn't sure if she recognized him.  He confirmed he had lung cancer and will begin radiation treatment this week.  Patient appeared to engage positively when talking about his children.  His daughter,Beth, has been visiting with patient, but was now in [place] with her own family.  Patient's other daughter, Opal Sidles, was on her way to their home and his son was flying in from [place] this evening.  He said he continues playing table tennis.  SW provided supportive counseling and active listening.  3. PATIENT/CAREGIVER EDUCATION/ COPING:  Patient expresses his feelings openly and by physical activity. 4. PERSONAL EMERGENCY PLAN:  Patient will call his daughter, Eustaquio Maize, with medical concerns.  He will sit or lie down when fatigued. 5. COMMUNITY RESOURCES COORDINATION/ HEALTH CARE NAVIGATION:  None 6. FINANCIAL/LEGAL CONCERNS/INTERVENTIONS:  None  SOCIAL HX:  Social History   Tobacco Use  . Smoking status: Former Smoker    Packs/day: 1.00    Years: 23.00    Pack years: 23.00    Last attempt to  quit: 1978    Years since quitting: 42.0  . Smokeless tobacco: Never Used  Substance Use Topics  . Alcohol use: Yes    Comment: rare    CODE STATUS:  Full Code  ADVANCED DIRECTIVES: N MOST FORM COMPLETE:  Y HOSPICE EDUCATION PROVIDED:  N PPS:  Patient said his appetite is normal.  He is able to stand and ambulate independently. Duration of visit and documentation:  75 minutes.      Creola Corn Hampton Cost, LCSW

## 2018-02-28 ENCOUNTER — Encounter: Payer: Self-pay | Admitting: Internal Medicine

## 2018-02-28 ENCOUNTER — Ambulatory Visit
Admission: RE | Admit: 2018-02-28 | Discharge: 2018-02-28 | Disposition: A | Discharge status: Home / Self Care | Payer: Medicare Other | Source: Ambulatory Visit | Attending: Radiation Oncology | Admitting: Radiation Oncology

## 2018-02-28 DIAGNOSIS — C342 Malignant neoplasm of middle lobe, bronchus or lung: Secondary | ICD-10-CM | POA: Diagnosis not present

## 2018-02-28 DIAGNOSIS — Z510 Encounter for antineoplastic radiation therapy: Secondary | ICD-10-CM | POA: Diagnosis not present

## 2018-03-02 ENCOUNTER — Encounter: Payer: Self-pay | Admitting: Internal Medicine

## 2018-03-02 DIAGNOSIS — C7492 Malignant neoplasm of unspecified part of left adrenal gland: Secondary | ICD-10-CM | POA: Diagnosis not present

## 2018-03-03 ENCOUNTER — Encounter: Payer: Self-pay | Admitting: Internal Medicine

## 2018-03-03 ENCOUNTER — Ambulatory Visit
Admission: RE | Admit: 2018-03-03 | Discharge: 2018-03-03 | Disposition: A | Discharge status: Home / Self Care | Payer: Medicare Other | Source: Ambulatory Visit | Attending: Radiation Oncology | Admitting: Radiation Oncology

## 2018-03-03 DIAGNOSIS — Z510 Encounter for antineoplastic radiation therapy: Secondary | ICD-10-CM | POA: Insufficient documentation

## 2018-03-03 DIAGNOSIS — C342 Malignant neoplasm of middle lobe, bronchus or lung: Secondary | ICD-10-CM | POA: Diagnosis not present

## 2018-03-06 ENCOUNTER — Ambulatory Visit: Payer: Medicare Other | Admitting: Radiation Oncology

## 2018-03-06 ENCOUNTER — Encounter (HOSPITAL_COMMUNITY): Payer: Self-pay | Admitting: Internal Medicine

## 2018-03-06 NOTE — Telephone Encounter (Signed)
I have sent a message to chaps for chart to be marked.

## 2018-03-07 ENCOUNTER — Ambulatory Visit
Admission: RE | Admit: 2018-03-07 | Discharge: 2018-03-07 | Disposition: A | Discharge status: Home / Self Care | Payer: Medicare Other | Source: Ambulatory Visit | Attending: Radiation Oncology | Admitting: Radiation Oncology

## 2018-03-07 ENCOUNTER — Encounter (HOSPITAL_COMMUNITY): Payer: Self-pay | Admitting: Internal Medicine

## 2018-03-07 DIAGNOSIS — C342 Malignant neoplasm of middle lobe, bronchus or lung: Secondary | ICD-10-CM | POA: Diagnosis not present

## 2018-03-07 DIAGNOSIS — Z510 Encounter for antineoplastic radiation therapy: Secondary | ICD-10-CM | POA: Diagnosis not present

## 2018-03-08 ENCOUNTER — Ambulatory Visit: Payer: Medicare Other | Admitting: Radiation Oncology

## 2018-03-09 ENCOUNTER — Encounter: Payer: Self-pay | Admitting: Radiation Oncology

## 2018-03-09 ENCOUNTER — Ambulatory Visit
Admission: RE | Admit: 2018-03-09 | Discharge: 2018-03-09 | Disposition: A | Discharge status: Home / Self Care | Payer: Medicare Other | Source: Ambulatory Visit | Attending: Radiation Oncology | Admitting: Radiation Oncology

## 2018-03-09 DIAGNOSIS — C3431 Malignant neoplasm of lower lobe, right bronchus or lung: Secondary | ICD-10-CM | POA: Diagnosis not present

## 2018-03-09 DIAGNOSIS — Z510 Encounter for antineoplastic radiation therapy: Secondary | ICD-10-CM | POA: Diagnosis not present

## 2018-03-09 DIAGNOSIS — C342 Malignant neoplasm of middle lobe, bronchus or lung: Secondary | ICD-10-CM | POA: Diagnosis not present

## 2018-03-09 NOTE — Progress Notes (Signed)
I saw the patient today for his EOT visit. He's done well in tolerating radiotherapy. He will be due for repeat scan in about 6 weeks. I'll call him in 4 weeks to see how he's doing and coordinate his first baseline study. Unfortunatley he tells me that his wife suddenly passed away after a massive CVA. He's surrounded by family at home and I encouraged him to let us know if we could help. I encouraged him to chat with grief counseling through hospice if he was interested.

## 2018-03-16 NOTE — Progress Notes (Signed)
  Radiation Oncology         ([phone]) [phone] ________________________________  Name: Raymond Logan MRN: [id number]  Date: 03/09/2018  DOB: 07/30/1930  End of Treatment Note  Diagnosis:   85 y.o. male with RLL pulmonary nodule  Indication for treatment:  Curative       Radiation treatment dates:   02/24/2018, 02/28/2018, 03/03/2018, 03/07/2018, 03/09/2018  Site/dose:   The tumor in the RLL lung was treated with a course of stereotactic body radiation treatment. The patient received 50 Gy in 5 fractions at 10 Gy per fraction.  Beams/energy:   SBRT/SRT-VMAT // 6X-FFF Photon  Narrative: The patient tolerated radiation treatment relatively well.   The patient did not have any signs of acute toxicity during treatment.  Plan: The patient has completed radiation treatment. The patient will return to radiation oncology clinic for routine followup in one month. I advised the patient to call or return sooner if they have any questions or concerns related to their recovery or treatment.   ------------------------------------------------  Jodelle Gross, MD, PhD  This document serves as a record of services personally performed by Kyung Rudd, MD. It was created on his behalf by Rae Lips, a trained medical scribe. The creation of this record is based on the scribe's personal observations and the provider's statements to them. This document has been checked and approved by the attending provider.

## 2018-03-20 ENCOUNTER — Telehealth: Payer: Self-pay | Admitting: Licensed Clinical Social Worker

## 2018-03-20 NOTE — Telephone Encounter (Signed)
SW phoned patient and scheduled a home visit for Wednesday, 1/22, at 1pm.  He stated his wife, [name], died on 2022/03/11.  SW offered condolences.

## 2018-03-22 ENCOUNTER — Other Ambulatory Visit: Payer: Medicare Other | Admitting: Licensed Clinical Social Worker

## 2018-03-22 DIAGNOSIS — Z515 Encounter for palliative care: Secondary | ICD-10-CM

## 2018-03-22 NOTE — Progress Notes (Signed)
COMMUNITY PALLIATIVE CARE SW NOTE  PATIENT NAME: DAVARI LOPES DOB: 08-02-1930 MRN: [id number]  PRIMARY CARE PROVIDER: Biagio Borg, MD  RESPONSIBLE PARTY:  Acct ID - Guarantor Home Phone Work Phone Relationship Acct Type  [id number] [name]  Self P/F     [address], [place] [address]     PLAN OF CARE and INTERVENTIONS:             1. GOALS OF CARE/ ADVANCE CARE PLANNING:  Patient wishes to remain in his home.  He remains a full code. 2. SOCIAL/EMOTIONAL/SPIRITUAL ASSESSMENT/ INTERVENTIONS:  SW met with patient and his daughter, [name], in his home.  SW offered condolences on his wife's death at [facility].  Provided bereavement counseling to patient and his daughter.  [name] expressed having conflict with her sister regarding patient's future.  [name] will be living with him indefinitely.  He continues to play table tennis.  SW provided active listening.  [name] reports patient has some short term memory loss. 3. PATIENT/CAREGIVER EDUCATION/ COPING:  Patient appears to cope by reminiscing about his past.  He expresses his feelings openly. 4. PERSONAL EMERGENCY PLAN:  Patient will call EMS. 5. COMMUNITY RESOURCES COORDINATION/ HEALTH CARE NAVIGATION:  None. 6. FINANCIAL/LEGAL CONCERNS/INTERVENTIONS:  None.     SOCIAL HX:  Social History   Tobacco Use  . Smoking status: Former Smoker    Packs/day: 1.00    Years: 23.00    Pack years: 23.00    Last attempt to quit: 1978    Years since quitting: 42.0  . Smokeless tobacco: Never Used  Substance Use Topics  . Alcohol use: Yes    Comment: rare    CODE STATUS:  Full Code  ADVANCED DIRECTIVES: N MOST FORM COMPLETE:  Y HOSPICE EDUCATION PROVIDED:  Provided education to patient's daughter per her request. PPS:  Patient's appetite is normal.  He ambulates independently. Duration of visit and documentation:  75 minutes.   Creola Corn Taquana Bartley, LCSW

## 2018-04-04 ENCOUNTER — Other Ambulatory Visit: Payer: Medicare Other | Admitting: *Deleted

## 2018-04-04 ENCOUNTER — Ambulatory Visit: Payer: Medicare Other | Admitting: Thoracic Surgery (Cardiothoracic Vascular Surgery)

## 2018-04-04 ENCOUNTER — Other Ambulatory Visit: Payer: Medicare Other | Admitting: Licensed Clinical Social Worker

## 2018-04-04 ENCOUNTER — Encounter: Payer: Self-pay | Admitting: Thoracic Surgery (Cardiothoracic Vascular Surgery)

## 2018-04-04 ENCOUNTER — Ambulatory Visit (INDEPENDENT_AMBULATORY_CARE_PROVIDER_SITE_OTHER): Payer: Medicare Other | Admitting: Thoracic Surgery (Cardiothoracic Vascular Surgery)

## 2018-04-04 ENCOUNTER — Other Ambulatory Visit: Payer: Self-pay

## 2018-04-04 VITALS — BP 101/72 | HR 100 | Resp 18 | Ht 69.0 in | Wt 152.4 lb

## 2018-04-04 DIAGNOSIS — Z515 Encounter for palliative care: Secondary | ICD-10-CM

## 2018-04-04 DIAGNOSIS — Z902 Acquired absence of lung [part of]: Secondary | ICD-10-CM

## 2018-04-04 DIAGNOSIS — R911 Solitary pulmonary nodule: Secondary | ICD-10-CM | POA: Diagnosis not present

## 2018-04-04 NOTE — Progress Notes (Signed)
COMMUNITY PALLIATIVE CARE SW NOTE  PATIENT NAME: Raymond Logan DOB: [birth year] MRN: [id number]  PRIMARY CARE PROVIDER: Biagio Borg, MD  RESPONSIBLE PARTY:  Acct ID - Guarantor Home Phone Work Phone Relationship Acct Type  [id number] [name]  Self P/F     [address], [place], [place] [address]     PLAN OF CARE and INTERVENTIONS:             1. GOALS OF CARE/ ADVANCE CARE PLANNING:  Patient wants to remain in his home and be as independent as possible.  He is a full code and has a MOST form. 2. SOCIAL/EMOTIONAL/SPIRITUAL ASSESSMENT/ INTERVENTIONS:  SW and Daryl Eastern, Palliative Care RN, met with patient in his home.  His daughter, Raymond Logan, is out of state currently.  Although she lives in [place], she has been spending an increased amount of time with patient since his wife died.  Patient repeated himself three or four times about three different subjects.  He talked about sorting through his deceased wife's items and her will.  Raymond Logan is assisting him with the lawyer.  Patient was able to drive himself to the MD today and then went shopping.  He continues reminiscing about [name] and their life together. 3. PATIENT/CAREGIVER EDUCATION/ COPING:  Patient copes by expressing his feelings openly. 4. PERSONAL EMERGENCY PLAN:  Patient keeps a phone by him most of the time in case of an emergency.  5. COMMUNITY RESOURCES COORDINATION/ HEALTH CARE NAVIGATION:  None. 6. FINANCIAL/LEGAL CONCERNS/INTERVENTIONS:  None.     SOCIAL HX:  Social History   Tobacco Use  . Smoking status: Former Smoker    Packs/day: 1.00    Years: 23.00    Pack years: 23.00    Last attempt to quit: 1978    Years since quitting: 42.1  . Smokeless tobacco: Never Used  Substance Use Topics  . Alcohol use: Yes    Comment: rare    CODE STATUS:  Full Code  ADVANCED DIRECTIVES: N MOST FORM COMPLETE:  Y HOSPICE EDUCATION PROVIDED: N PPS:  Patient reports his appetite is normal.  He is able to stand  and ambulate independently.  He continues playing table tennis at the [employer] two days a week. Duration of visit and documentation:  75 minutes.      Creola Corn Laikynn Pollio, LCSW

## 2018-04-04 NOTE — Progress Notes (Signed)
[address]       [place] [address]             [phone]       HPI: Raymond Logan returns for a scheduled follow-up visit  Raymond Logan is an 85 year old gentleman who had a right middle lobectomy for stage I adenocarcinoma in 2018.  He had postoperative radiation for a close vascular margin.  He more recently had developed a groundglass opacity in the right lower lobe that slowly enlarged.  I did a navigational bronchoscopy back in December.  It was an adenocarcinoma.  He had stereotactic radiation by Dr. [name].  He completed treatment on that on 03/09/2018.  Unfortunately his wife had a stroke and passed away recently.  He is understandably upset by that.  He did not have any issues with the stereotactic radiation.  His respiratory status is stable.  He continues to play ping-pong.  Past Medical History:  Diagnosis Date  . ALLERGIC RHINITIS 01/23/2007  . Arthritis    hands  . BENIGN PROSTATIC HYPERTROPHY 09/10/2006  . Chronic headaches   . Chronic sinusitis   . COPD, MILD 04/03/2010   patient denies on preop visit of 08/02/2014   . Difficulty in urination   . Dyspnea   . FATIGUE 01/08/2008  . GERD 04/15/2010  . H. pylori infection    hx of   . Headache(784.0) 09/10/2006  . HOARSENESS 04/15/2010  . HYPERLIPIDEMIA 09/10/2006  . INSOMNIA-SLEEP DISORDER-UNSPEC 04/24/2009  . Lung cancer ([place])   . PAF (paroxysmal atrial fibrillation) ([facility])    identified on EKG 04/08/17  . PEPTIC ULCER DISEASE 01/23/2007  . Pneumonia    1955    Current Outpatient Medications  Medication Sig Dispense Refill  . albuterol (PROVENTIL HFA;VENTOLIN HFA) 108 (90 Base) MCG/ACT inhaler Inhale 2 puffs into the lungs every 6 (six) hours as needed for wheezing or shortness of breath. 1 Inhaler 11  . apixaban (ELIQUIS) 5 MG TABS tablet Take 1 tablet (5 mg total) by mouth 2 (two) times daily. 180 tablet 3  . calcium carbonate (TUMS - DOSED IN MG ELEMENTAL CALCIUM) 500 MG chewable tablet  Chew 1 tablet by mouth daily as needed for indigestion or heartburn.     . finasteride (PROSCAR) 5 MG tablet Take 5 mg by mouth at bedtime.     . fluticasone (FLONASE) 50 MCG/ACT nasal spray Place 2 sprays into both nostrils daily as needed for allergies or rhinitis.   11  . furosemide (LASIX) 20 MG tablet Take 1 tablet (20 mg total) by mouth daily. 90 tablet 3  . propafenone (RYTHMOL) 225 MG tablet Take 1 tablet (225 mg total) by mouth 2 (two) times daily. 60 tablet 11  . silodosin (RAPAFLO) 8 MG CAPS capsule Take 8 mg by mouth at bedtime.     [name] zolpidem (AMBIEN) 5 MG tablet TAKE 1 TABLET BY MOUTH AT BEDTIME AS NEEDED FOR SLEEP (Patient taking differently: Take 5 mg by mouth at bedtime. ) 30 tablet 2   No current facility-administered medications for this visit.     Physical Exam BP 101/72 (BP Location: Left Arm, Patient Position: Sitting, Cuff Size: Normal)   Pulse 100   Resp 18   Ht 5\' 9"  (1.753 m)   Wt 152 lb 6.4 oz (69.1 kg)   SpO2 95% Comment: RA  BMI 22.66 kg/m  85 year old man in no acute distress Alert and oriented x3 with no focal deficits Lungs clear Cardiac regular  rate and rhythm  Impression: Raymond Logan is an 85 year old gentleman who had a right middle lobectomy for stage I adenocarcinoma of the right middle lobe followed by adjuvant radiation about 2 years ago.  He developed a new right lower lobe lung nodule.  Morphologically was consistent with a new primary.  It increased over time.  On biopsy it was an adenocarcinoma.  He has completed stereotactic radiation for that.  He tolerated stereotactic radiation well.  He has been through a lot recently with his wife passing away.  He has follow-up scheduled with Dr. Julien Nordmann and Dr. [name].  At this point I am not making a significant contribution to his care.  He does not have any surgical issues.  I will defer to Drs. Mohamed and Dillingham for follow-up.  Plan: I will be happy to see Raymond Logan back at anytime if I can be  of any further assistance with his care.  [name], MD [facility] [phone]

## 2018-04-07 ENCOUNTER — Inpatient Hospital Stay: Payer: Medicare Other | Attending: Internal Medicine

## 2018-04-07 DIAGNOSIS — Z79899 Other long term (current) drug therapy: Secondary | ICD-10-CM | POA: Insufficient documentation

## 2018-04-07 DIAGNOSIS — Z923 Personal history of irradiation: Secondary | ICD-10-CM | POA: Insufficient documentation

## 2018-04-07 DIAGNOSIS — C3431 Malignant neoplasm of lower lobe, right bronchus or lung: Secondary | ICD-10-CM | POA: Insufficient documentation

## 2018-04-07 DIAGNOSIS — C342 Malignant neoplasm of middle lobe, bronchus or lung: Secondary | ICD-10-CM

## 2018-04-07 DIAGNOSIS — Z7901 Long term (current) use of anticoagulants: Secondary | ICD-10-CM | POA: Insufficient documentation

## 2018-04-07 LAB — CMP (CANCER CENTER ONLY)
ALT: <6 U/L (ref 0–44)
AST: 12 U/L — ABNORMAL LOW (ref 15–41)
Albumin: 3.5 g/dL (ref 3.5–5.0)
Alkaline Phosphatase: 113 U/L (ref 38–126)
Anion gap: 7 (ref 5–15)
BUN: 15 mg/dL (ref 8–23)
CO2: 26 mmol/L (ref 22–32)
Calcium: 8.8 mg/dL — ABNORMAL LOW (ref 8.9–10.3)
Chloride: 106 mmol/L (ref 98–111)
Creatinine: 1.12 mg/dL (ref 0.61–1.24)
GFR, Est AFR Am: >60 mL/min (ref >60)
GFR, Estimated: 60 mL/min — ABNORMAL LOW (ref >60)
Glucose, Bld: 110 mg/dL — ABNORMAL HIGH (ref 70–99)
Potassium: 4 mmol/L (ref 3.5–5.1)
Sodium: 139 mmol/L (ref 135–145)
Total Bilirubin: 0.7 mg/dL (ref 0.3–1.2)
Total Protein: 6.9 g/dL (ref 6.5–8.1)

## 2018-04-07 LAB — CBC WITH DIFFERENTIAL (CANCER CENTER ONLY)
Abs Immature Granulocytes: 0.01 10*3/uL (ref 0.00–0.07)
Basophils Absolute: 0 10*3/uL (ref 0.0–0.1)
Basophils Relative: 1 %
Eosinophils Absolute: 0.1 10*3/uL (ref 0.0–0.5)
Eosinophils Relative: 2 %
HCT: 36.3 % — ABNORMAL LOW (ref 39.0–52.0)
Hemoglobin: 12.4 g/dL — ABNORMAL LOW (ref 13.0–17.0)
Immature Granulocytes: 0 %
Lymphocytes Relative: 12 %
Lymphs Abs: 0.5 10*3/uL — ABNORMAL LOW (ref 0.7–4.0)
MCH: 30.5 pg (ref 26.0–34.0)
MCHC: 34.2 g/dL (ref 30.0–36.0)
MCV: 89.2 fL (ref 80.0–100.0)
MONO ABS: 0.5 10*3/uL (ref 0.1–1.0)
Monocytes Relative: 11 %
Neutro Abs: 3.2 10*3/uL (ref 1.7–7.7)
Neutrophils Relative %: 74 %
Platelet Count: 198 10*3/uL (ref 150–400)
RBC: 4.07 MIL/uL — ABNORMAL LOW (ref 4.22–5.81)
RDW: 12.3 % (ref 11.5–15.5)
WBC: 4.3 10*3/uL (ref 4.0–10.5)
nRBC: 0 % (ref 0.0–0.2)

## 2018-04-07 NOTE — Progress Notes (Signed)
COMMUNITY PALLIATIVE CARE RN NOTE  PATIENT NAME: WOOD NOVACEK DOB: Feb 05, 1931 MRN: [id number]  PRIMARY CARE PROVIDER: Biagio Borg, MD  RESPONSIBLE PARTY:  Acct ID - Guarantor Home Phone Work Phone Relationship Acct Type  [id number] [name]  Self P/F     [address], [place], [place] [address]    PLAN OF CARE and INTERVENTION:  1. ADVANCE CARE PLANNING/GOALS OF CARE: He wants to remain in his home for as long as possible 2. PATIENT/CAREGIVER EDUCATION: Reinforced Safe Mobility and Energy Conservation 3. DISEASE STATUS: Joint visit made with Palliative Care SW, Lynn Duffy. Patient alert and oriented x 3. Pleasant and engaging, but will repeat stories several times during visits. He denies pain. He continues to wear a brace on his L knee for more stability. Mild dyspnea noted on occasion during lengthy conversation. Occasional coughing, and will use lozenges frequently throughout the day which is helpful. He continues to be as active as possible. He reports walking about 3 miles this week following a trail in his neighborhood. He also continues to play table tennis at the [facility] on Wednesdays and Fridays. He is able to play about 3-5 games before having to stop due to fatigue. He continues to drive to MD appointments and the store when needed. He remains able to perform all ADLs independently. His daughter visits and fills his pill dispenser regularly which holds 28 days. He had an appointment this am with the surgeon that performed his past lung surgery d/t his lung cancer over a year ago. He recently completed radiation last month for RLL tumor. No changes made to plan of care. He has an upcoming appointment with his Oncologist and PCP for follow up. Provided active listening as he reminisced about his wife, who passed away a few weeks ago. Will continue to monitor.  HISTORY OF PRESENT ILLNESS:  This is a 85 yo male who resides at home alone. His youngest daughter has been  visiting more often, days at a time. Palliative Care Team continues to follow patient. Next visit scheduled in 1 month.  CODE STATUS: FULL CODE ADVANCED DIRECTIVES: Y (Living Will, HCPOA) MOST FORM: yes PPS: 60%   PHYSICAL EXAM:   LUNGS: clear to auscultation  CARDIAC: Cor RRR EXTREMITIES: No edema SKIN: Exposed skin is dry and intact, no wounds  NEURO: Alert and oriented x 3, forgetful and repetitive at times, pleasant mood, ambulatory without assistive devices   (Duration of visit and documentation 75 minutes)    Daryl Eastern, RN, BSN

## 2018-04-11 ENCOUNTER — Ambulatory Visit: Payer: Medicare Other | Admitting: Nurse Practitioner

## 2018-04-12 ENCOUNTER — Encounter: Payer: Self-pay | Admitting: Internal Medicine

## 2018-04-12 ENCOUNTER — Ambulatory Visit (INDEPENDENT_AMBULATORY_CARE_PROVIDER_SITE_OTHER): Payer: Medicare Other | Admitting: Internal Medicine

## 2018-04-12 VITALS — BP 112/72 | HR 96 | Temp 98.3°F | Ht 69.0 in | Wt 153.0 lb

## 2018-04-12 DIAGNOSIS — G4700 Insomnia, unspecified: Secondary | ICD-10-CM | POA: Diagnosis not present

## 2018-04-12 DIAGNOSIS — Z23 Encounter for immunization: Secondary | ICD-10-CM

## 2018-04-12 DIAGNOSIS — J449 Chronic obstructive pulmonary disease, unspecified: Secondary | ICD-10-CM

## 2018-04-12 DIAGNOSIS — R739 Hyperglycemia, unspecified: Secondary | ICD-10-CM | POA: Diagnosis not present

## 2018-04-12 LAB — POCT GLYCOSYLATED HEMOGLOBIN (HGB A1C): HEMOGLOBIN A1C: 4.9 % (ref 4.0–5.6)

## 2018-04-12 MED ORDER — ALBUTEROL SULFATE HFA 108 (90 BASE) MCG/ACT IN AERS: 2.0000 | INHALATION_SPRAY | Freq: Four times a day (QID) | RESPIRATORY_TRACT | 11 refills | Status: DC | PRN

## 2018-04-12 MED ORDER — ZOLPIDEM TARTRATE 5 MG PO TABS: 5.0000 mg | ORAL_TABLET | Freq: Every evening | ORAL | 5 refills | Status: DC | PRN

## 2018-04-12 NOTE — Progress Notes (Addendum)
Subjective:    Patient ID: Raymond Logan, male    DOB: 10/21/1930, 85 y.o.   MRN: [id number]  HPI  Here to f/u; overall doing ok,  Pt denies chest pain, increasing sob or doe, wheezing, orthopnea, PND, increased LE swelling, palpitations, dizziness or syncope.  Pt denies new neurological symptoms such as new headache, or facial or extremity weakness or numbness.  Pt denies polydipsia, polyuria, or low sugar episode.  Pt states overall good compliance with meds, mostly trying to follow appropriate diet, with wt overall stable,  but little exercise however.  Only played one game of ping pog due to sob/doe, normally plays 4-5 games, has not remembered to take the inhaler. Wife died a mo ago, grieving ok per pt.  No new complaints, has ongoing getting to sleep difficulty most night. Denies worsening depressive symptoms, suicidal ideation, or panic Past Medical History:  Diagnosis Date  . ALLERGIC RHINITIS 01/23/2007  . Arthritis    hands  . BENIGN PROSTATIC HYPERTROPHY 09/10/2006  . Chronic headaches   . Chronic sinusitis   . COPD, MILD 04/03/2010   patient denies on preop visit of 08/02/2014   . Difficulty in urination   . Dyspnea   . FATIGUE 01/08/2008  . GERD 04/15/2010  . H. pylori infection    hx of   . Headache(784.0) 09/10/2006  . HOARSENESS 04/15/2010  . HYPERLIPIDEMIA 09/10/2006  . INSOMNIA-SLEEP DISORDER-UNSPEC 04/24/2009  . Lung cancer ([facility])   . PAF (paroxysmal atrial fibrillation) ([facility])    identified on EKG 04/08/17  . PEPTIC ULCER DISEASE 01/23/2007  . Pneumonia    1955   Past Surgical History:  Procedure Laterality Date  . CATARACT EXTRACTION Left   . COLONOSCOPY    . CYSTOSCOPY N/A 10/07/2016   Procedure: CYSTOSCOPY;  Surgeon: Festus Aloe, MD;  Location: [facility];  Service: Urology;  Laterality: N/A;  . FUDUCIAL PLACEMENT Right 02/06/2018   Procedure: PLACEMENT OF FIDUCIAL RIGHT LOWER LOBE LUNG;  Surgeon: [name], MD;  Location: [facility];  Service: Thoracic;   Laterality: Right;  . HEMORRHOID BANDING    . INSERTION OF SUPRAPUBIC CATHETER N/A 10/07/2016   Procedure: FOLEY  CATHETER PLACEMENT;  Surgeon: Festus Aloe, MD;  Location: [facility];  Service: Urology;  Laterality: N/A;  . KNEE ARTHROSCOPY Left    torn meniscus  . LUMBAR LAMINECTOMY/DECOMPRESSION MICRODISCECTOMY Right 08/07/2014   Procedure: complete DECOMPRESSION LUMBAR LAMINECTOMY/MICRODISCECTOMY OF L4-L5 for spinal stenosis, DECOMPRESSION OF L3-L4;  Surgeon: Latanya Maudlin, MD;  Location: [facility];  Service: Orthopedics;  Laterality: Right;  . NASAL SINUS SURGERY Left 07/04/2017   Procedure: LEFT ENDOSCOPIC SINUS SURGERY;  Surgeon: Jerrell Belfast, MD;  Location: [facility];  Service: ENT;  Laterality: Left;  . RIGHT/LEFT HEART CATH AND CORONARY ANGIOGRAPHY N/A 06/21/2017   Procedure: RIGHT/LEFT HEART CATH AND CORONARY ANGIOGRAPHY;  Surgeon: Belva Crome, MD;  Location: [facility];  Service: Cardiovascular;  Laterality: N/A;  . ROTATOR CUFF REPAIR Right   . SHOULDER ARTHROSCOPY     x3, L & R  . SINUS ENDO WITH FUSION N/A 07/04/2017   Procedure: SINUS ENDO WITH FUSION;  Surgeon: Jerrell Belfast, MD;  Location: [facility];  Service: ENT;  Laterality: N/A;  . VIDEO ASSISTED THORACOSCOPY (VATS)/ LOBECTOMY Right 10/07/2016   Procedure: VIDEO ASSISTED THORACOSCOPY (VATS)/RIGHT MIDDLE LOBECTOMY;  Surgeon: [name], MD;  Location: [facility];  Service: Thoracic;  Laterality: Right;  [name] VIDEO BRONCHOSCOPY WITH ENDOBRONCHIAL NAVIGATION N/A 09/23/2016   Procedure: VIDEO  BRONCHOSCOPY WITH ENDOBRONCHIAL NAVIGATION;  Surgeon: [name], MD;  Location: [facility];  Service: Thoracic;  Laterality: N/A;  . VIDEO BRONCHOSCOPY WITH ENDOBRONCHIAL NAVIGATION N/A 02/06/2018   Procedure: VIDEO BRONCHOSCOPY WITH ENDOBRONCHIAL NAVIGATION;  Surgeon: [name], MD;  Location: [facility];  Service: Thoracic;  Laterality: N/A;  . VIDEO BRONCHOSCOPY WITH ENDOBRONCHIAL ULTRASOUND N/A 09/23/2016   Procedure:  VIDEO BRONCHOSCOPY WITH ENDOBRONCHIAL ULTRASOUND;  Surgeon: [name], MD;  Location: [facility];  Service: Thoracic;  Laterality: N/A;    reports that he quit smoking about 42 years ago. He has a 23.00 pack-year smoking history. He has never used smokeless tobacco. He reports current alcohol use. He reports that he does not use drugs. family history includes Alzheimer's disease in his mother and sister; Prostate cancer in his brother. Allergies  Allergen Reactions  . Alfuzosin Other (See Comments) and Hypertension    BP went crazy, dizzy, passing out   . Aricept [Donepezil Hcl] Other (See Comments)    Insomnia, headache  . Penicillins Hives and Other (See Comments)    A PCN REACTION WITH IMMEDIATE RASH, FACIAL/TONGUE/THROAT SWELLING, SOB, OR LIGHTHEADEDNESS WITH HYPOTENSION:  #  #  #  YES  #  #  #   Has patient had a PCN reaction causing severe rash involving mucus membranes or skin necrosis:No Has patient had a PCN reaction that required hospitalization:No Has patient had a PCN reaction occurring within the last 10 years:No If all of the above answers are "NO", then may proceed with Cephalosporin use.    Current Outpatient Medications on File Prior to Visit  Medication Sig Dispense Refill  . apixaban (ELIQUIS) 5 MG TABS tablet Take 1 tablet (5 mg total) by mouth 2 (two) times daily. 180 tablet 3  . calcium carbonate (TUMS - DOSED IN MG ELEMENTAL CALCIUM) 500 MG chewable tablet Chew 1 tablet by mouth daily as needed for indigestion or heartburn.     . finasteride (PROSCAR) 5 MG tablet Take 5 mg by mouth at bedtime.     . fluticasone (FLONASE) 50 MCG/ACT nasal spray Place 2 sprays into both nostrils daily as needed for allergies or rhinitis.   11  . furosemide (LASIX) 20 MG tablet Take 1 tablet (20 mg total) by mouth daily. 90 tablet 3  . propafenone (RYTHMOL) 225 MG tablet Take 1 tablet (225 mg total) by mouth 2 (two) times daily. 60 tablet 11  . silodosin (RAPAFLO) 8 MG CAPS  capsule Take 8 mg by mouth at bedtime.      No current facility-administered medications on file prior to visit.    Review of Systems  Constitutional: Negative for other unusual diaphoresis or sweats HENT: Negative for ear discharge or swelling Eyes: Negative for other worsening visual disturbances Respiratory: Negative for stridor or other swelling  Gastrointestinal: Negative for worsening distension or other blood Genitourinary: Negative for retention or other urinary change Musculoskeletal: Negative for other MSK pain or swelling Skin: Negative for color change or other new lesions Neurological: Negative for worsening tremors and other numbness  Psychiatric/Behavioral: Negative for worsening agitation or other fatigue All other system neg per pt    Objective:   Physical Exam BP 112/72   Pulse 96   Temp 98.3 F (36.8 C) (Oral)   Ht 5\' 9"  (1.753 m)   Wt 153 lb (69.4 kg)   SpO2 96%   BMI 22.59 kg/m  VS noted,  Constitutional: Pt appears in NAD HENT: Head: NCAT.  Right Ear: External  ear normal.  Left Ear: External ear normal.  Eyes: . Pupils are equal, round, and reactive to light. Conjunctivae and EOM are normal Nose: without d/c or deformity Neck: Neck supple. Gross normal ROM Cardiovascular: Normal rate and regular rhythm.   Pulmonary/Chest: Effort normal and breath sounds decreased without rales or wheezing.  Abd:  Soft, NT, ND, + BS, no organomegaly Neurological: Pt is alert. At baseline orientation, motor grossly intact Skin: Skin is warm. No rashes, other new lesions, no LE edema Psychiatric: Pt behavior is normal without agitation  No other exam fidnings Lab Results  Component Value Date   WBC 4.3 04/07/2018   HGB 12.4 (L) 04/07/2018   HCT 36.3 (L) 04/07/2018   PLT 198 04/07/2018   GLUCOSE 110 (H) 04/07/2018   CHOL 169 03/18/2017   TRIG 122.0 03/18/2017   HDL 35.90 (L) 03/18/2017   LDLDIRECT 134.4 03/27/2010   LDLCALC 108 (H) 03/18/2017   ALT <6  04/07/2018   AST 12 (L) 04/07/2018   NA 139 04/07/2018   K 4.0 04/07/2018   CL 106 04/07/2018   CREATININE 1.12 04/07/2018   BUN 15 04/07/2018   CO2 26 04/07/2018   TSH 0.92 11/09/2017   PSA 1.91 03/18/2017   INR 1.10 02/06/2018   HGBA1C 4.9 04/12/2018  POCT glycosylated hemoglobin (Hb A1C)  Order: [id number]  Status:  Final result Visible to patient:  No (Not Released) Dx:  Hyperglycemia   Ref Range & Units 1d ago 63mo ago 20yr ago 46yr ago  Hemoglobin A1C 4.0 - 5.6 % 4.9  5.2  5.8 R, CM 5.0 R, CM            Assessment & Plan:

## 2018-04-12 NOTE — Assessment & Plan Note (Signed)
stable overall by history and exam, recent data reviewed with pt, and pt to continue medical treatment as before,  to f/u any worsening symptoms or concerns  

## 2018-04-12 NOTE — Assessment & Plan Note (Signed)
For inhaler restart,  to f/u any worsening symptoms or concerns

## 2018-04-12 NOTE — Patient Instructions (Addendum)
You had the Tdap tetanus shot today  Your A1c test was OK today  Please continue all other medications as before, and refills have been done if requested - ambien  Please have the pharmacy call with any other refills you may need.  Please continue your efforts at being more active, low cholesterol diet, and weight control.  You are otherwise up to date with prevention measures today.  Please keep your appointments with your specialists as you may have planned  Please return in 3 months, or sooner if needed,

## 2018-04-12 NOTE — Assessment & Plan Note (Signed)
Ok for [facility] refill,  to f/u any worsening symptoms or concerns

## 2018-04-13 ENCOUNTER — Inpatient Hospital Stay (HOSPITAL_BASED_OUTPATIENT_CLINIC_OR_DEPARTMENT_OTHER): Payer: Medicare Other | Admitting: Internal Medicine

## 2018-04-13 ENCOUNTER — Encounter: Payer: Self-pay | Admitting: Internal Medicine

## 2018-04-13 ENCOUNTER — Telehealth: Payer: Self-pay | Admitting: Internal Medicine

## 2018-04-13 VITALS — BP 111/71 | HR 100 | Temp 98.1°F | Resp 18 | Ht 69.0 in | Wt 152.6 lb

## 2018-04-13 DIAGNOSIS — C342 Malignant neoplasm of middle lobe, bronchus or lung: Secondary | ICD-10-CM | POA: Diagnosis not present

## 2018-04-13 DIAGNOSIS — J449 Chronic obstructive pulmonary disease, unspecified: Secondary | ICD-10-CM | POA: Diagnosis not present

## 2018-04-13 DIAGNOSIS — Z7901 Long term (current) use of anticoagulants: Secondary | ICD-10-CM | POA: Diagnosis not present

## 2018-04-13 DIAGNOSIS — Z79899 Other long term (current) drug therapy: Secondary | ICD-10-CM | POA: Diagnosis not present

## 2018-04-13 DIAGNOSIS — Z923 Personal history of irradiation: Secondary | ICD-10-CM | POA: Diagnosis not present

## 2018-04-13 DIAGNOSIS — C3490 Malignant neoplasm of unspecified part of unspecified bronchus or lung: Secondary | ICD-10-CM

## 2018-04-13 DIAGNOSIS — C3431 Malignant neoplasm of lower lobe, right bronchus or lung: Secondary | ICD-10-CM | POA: Diagnosis not present

## 2018-04-13 NOTE — Telephone Encounter (Signed)
Scheduled appt per 2/13 los.  Printed calendar and avs.

## 2018-04-13 NOTE — Progress Notes (Signed)
[facility] [phone]   Fax:([phone]  OFFICE PROGRESS NOTE  Biagio Borg, MD [place] [address]  DIAGNOSIS:stage IA(T1c, N0, M0) non-small cell lung cancer, adenocarcinoma status post right middle lobectomy with lymph node dissection withvery close vascular resection margin diagnosed in July 2018.  He has recurrent adenocarcinoma of the right lower lobe in December 2020.  PRIOR THERAPY: 1)status post right middle lobectomy with lymph node dissection withvery close vascular resection margin in July 2018. 2)radiation to the right lung 11/18/2016 through 12/30/2016. 3) status post curative radiotherapy to the right lower lobe pulmonary nodule under the care of Dr. [name] completed on March 07, 2018.  CURRENT THERAPY:Observation  INTERVAL HISTORY: Raymond Logan 85 y.o. male returns to the clinic today for follow-up visit.  The patient is feeling fine today with no concerning complaints except for the shortness of breath with exertion.  He has no chest pain, cough or hemoptysis.  He denied having any weight loss or night sweats.  He has no nausea, vomiting, diarrhea or constipation.  He had bronchoscopy and biopsy of the right lower lobe pulmonary nodule and it was consistent with adenocarcinoma.  The patient underwent curative stereotactic body radiotherapy under the care of Dr. [name] completed on March 07, 2018.  He tolerated his radiotherapy fairly well.  He is here today for evaluation and recommendation regarding his condition.  MEDICAL HISTORY: Past Medical History:  Diagnosis Date  . ALLERGIC RHINITIS 01/23/2007  . Arthritis    hands  . BENIGN PROSTATIC HYPERTROPHY 09/10/2006  . Chronic headaches   . Chronic sinusitis   . COPD, MILD 04/03/2010   patient denies on preop visit of 08/02/2014   . Difficulty in urination   . Dyspnea   . FATIGUE 01/08/2008  . GERD 04/15/2010  . H. pylori infection    hx of   .  Headache(784.0) 09/10/2006  . HOARSENESS 04/15/2010  . HYPERLIPIDEMIA 09/10/2006  . INSOMNIA-SLEEP DISORDER-UNSPEC 04/24/2009  . Lung cancer ([facility])   . PAF (paroxysmal atrial fibrillation) ([facility])    identified on EKG 04/08/17  . PEPTIC ULCER DISEASE 01/23/2007  . Pneumonia    1955    ALLERGIES:  is allergic to alfuzosin; aricept [donepezil hcl]; and penicillins.  MEDICATIONS:  Current Outpatient Medications  Medication Sig Dispense Refill  . albuterol (PROVENTIL HFA;VENTOLIN HFA) 108 (90 Base) MCG/ACT inhaler Inhale 2 puffs into the lungs every 6 (six) hours as needed for wheezing or shortness of breath. 1 Inhaler 11  . apixaban (ELIQUIS) 5 MG TABS tablet Take 1 tablet (5 mg total) by mouth 2 (two) times daily. 180 tablet 3  . calcium carbonate (TUMS - DOSED IN MG ELEMENTAL CALCIUM) 500 MG chewable tablet Chew 1 tablet by mouth daily as needed for indigestion or heartburn.     . finasteride (PROSCAR) 5 MG tablet Take 5 mg by mouth at bedtime.     . fluticasone (FLONASE) 50 MCG/ACT nasal spray Place 2 sprays into both nostrils daily as needed for allergies or rhinitis.   11  . furosemide (LASIX) 20 MG tablet Take 1 tablet (20 mg total) by mouth daily. 90 tablet 3  . propafenone (RYTHMOL) 225 MG tablet Take 1 tablet (225 mg total) by mouth 2 (two) times daily. 60 tablet 11  . silodosin (RAPAFLO) 8 MG CAPS capsule Take 8 mg by mouth at bedtime.     [name] zolpidem (AMBIEN) 5 MG tablet Take 1 tablet (  5 mg total) by mouth at bedtime as needed. for sleep 30 tablet 5   No current facility-administered medications for this visit.     SURGICAL HISTORY:  Past Surgical History:  Procedure Laterality Date  . CATARACT EXTRACTION Left   . COLONOSCOPY    . CYSTOSCOPY N/A 10/07/2016   Procedure: CYSTOSCOPY;  Surgeon: Festus Aloe, MD;  Location: [facility];  Service: Urology;  Laterality: N/A;  . FUDUCIAL PLACEMENT Right 02/06/2018   Procedure: PLACEMENT OF FIDUCIAL RIGHT LOWER LOBE LUNG;  Surgeon:  [name], MD;  Location: [facility];  Service: Thoracic;  Laterality: Right;  . HEMORRHOID BANDING    . INSERTION OF SUPRAPUBIC CATHETER N/A 10/07/2016   Procedure: FOLEY  CATHETER PLACEMENT;  Surgeon: Festus Aloe, MD;  Location: [facility];  Service: Urology;  Laterality: N/A;  . KNEE ARTHROSCOPY Left    torn meniscus  . LUMBAR LAMINECTOMY/DECOMPRESSION MICRODISCECTOMY Right 08/07/2014   Procedure: complete DECOMPRESSION LUMBAR LAMINECTOMY/MICRODISCECTOMY OF L4-L5 for spinal stenosis, DECOMPRESSION OF L3-L4;  Surgeon: Latanya Maudlin, MD;  [facility];  Service: Orthopedics;  Laterality: Right;  . NASAL SINUS SURGERY Left 07/04/2017   Procedure: LEFT ENDOSCOPIC SINUS SURGERY;  Surgeon: Jerrell Belfast, MD;  Location: [facility];  Service: ENT;  Laterality: Left;  . RIGHT/LEFT HEART CATH AND CORONARY ANGIOGRAPHY N/A 06/21/2017   Procedure: RIGHT/LEFT HEART CATH AND CORONARY ANGIOGRAPHY;  Surgeon: Belva Crome, MD;  Location: [facility];  Service: Cardiovascular;  Laterality: N/A;  . ROTATOR CUFF REPAIR Right   . SHOULDER ARTHROSCOPY     x3, L & R  . SINUS ENDO WITH FUSION N/A 07/04/2017   Procedure: SINUS ENDO WITH FUSION;  Surgeon: Jerrell Belfast, MD;  Location: [facility];  Service: ENT;  Laterality: N/A;  . VIDEO ASSISTED THORACOSCOPY (VATS)/ LOBECTOMY Right 10/07/2016   Procedure: VIDEO ASSISTED THORACOSCOPY (VATS)/RIGHT MIDDLE LOBECTOMY;  Surgeon: [name], MD;  Location: [facility];  Service: Thoracic;  Laterality: Right;  [name] VIDEO BRONCHOSCOPY WITH ENDOBRONCHIAL NAVIGATION N/A 09/23/2016   Procedure: VIDEO BRONCHOSCOPY WITH ENDOBRONCHIAL NAVIGATION;  Surgeon: [name], MD;  Location: [facility];  Service: Thoracic;  Laterality: N/A;  . VIDEO BRONCHOSCOPY WITH ENDOBRONCHIAL NAVIGATION N/A 02/06/2018   Procedure: VIDEO BRONCHOSCOPY WITH ENDOBRONCHIAL NAVIGATION;  Surgeon: [name], MD;  Location: [facility];  Service: Thoracic;  Laterality: N/A;  . VIDEO  BRONCHOSCOPY WITH ENDOBRONCHIAL ULTRASOUND N/A 09/23/2016   Procedure: VIDEO BRONCHOSCOPY WITH ENDOBRONCHIAL ULTRASOUND;  Surgeon: [name], MD;  [facility];  Service: Thoracic;  Laterality: N/A;    REVIEW OF SYSTEMS:  A comprehensive review of systems was negative except for: Constitutional: positive for fatigue Respiratory: positive for dyspnea on exertion   PHYSICAL EXAMINATION: General appearance: alert, cooperative, fatigued and no distress Head: Normocephalic, without obvious abnormality, atraumatic Neck: no adenopathy, no JVD, supple, symmetrical, trachea midline and thyroid not enlarged, symmetric, no tenderness/mass/nodules Lymph nodes: Cervical, supraclavicular, and axillary nodes normal. Resp: clear to auscultation bilaterally Back: symmetric, no curvature. ROM normal. No CVA tenderness. Cardio: regular rate and rhythm, S1, S2 normal, no murmur, click, rub or gallop GI: soft, non-tender; bowel sounds normal; no masses,  no organomegaly Extremities: extremities normal, atraumatic, no cyanosis or edema  ECOG PERFORMANCE STATUS: 1 - Symptomatic but completely ambulatory  Blood pressure 111/71, pulse 100, temperature 98.1 F (36.7 C), temperature source Oral, resp. rate 18, height 5\' 9"  (1.753 m), weight 152 lb 9.6 oz (69.2 kg), SpO2 98 %.  LABORATORY DATA: Lab Results  Component Value Date   WBC 4.3 04/07/2018   HGB 12.4 (L) 04/07/2018   HCT 36.3 (L) 04/07/2018   MCV 89.2 04/07/2018   PLT 198 04/07/2018      Chemistry      Component Value Date/Time   NA 139 04/07/2018 1104   NA 139 08/02/2017 1242   NA 142 11/04/2016 1334   K 4.0 04/07/2018 1104   K 4.0 11/04/2016 1334   CL 106 04/07/2018 1104   CO2 26 04/07/2018 1104   CO2 26 11/04/2016 1334   BUN 15 04/07/2018 1104   BUN 15 08/02/2017 1242   BUN 15.4 11/04/2016 1334   CREATININE 1.12 04/07/2018 1104   CREATININE 1.2 11/04/2016 1334      Component Value Date/Time   CALCIUM 8.8 (L) 04/07/2018  1104   CALCIUM 9.3 11/04/2016 1334   ALKPHOS 113 04/07/2018 1104   ALKPHOS 91 11/04/2016 1334   AST 12 (L) 04/07/2018 1104   AST 14 11/04/2016 1334   ALT <6 04/07/2018 1104   ALT 10 11/04/2016 1334   BILITOT 0.7 04/07/2018 1104   BILITOT 0.37 11/04/2016 1334       RADIOGRAPHIC STUDIES: No results found.  ASSESSMENT AND PLAN: This is a very pleasant 85 years old white male with history of stage Ia non-small cell lung cancer, adenocarcinoma status post right middle lobectomy with lymph node dissection in July 2018.  The patient has recurrence of adenocarcinoma with new nodule in the right lower lobe diagnosed in December 2019.  He underwent stereotactic body radiotherapy to this lesion completed January 2020. The patient is feeling fine today with no concerning complaints except for the shortness of breath with exertion. I recommended for him to continue on observation with repeat CT scan of the chest in 3 months for restaging of his disease. He was advised to call immediately if he has any concerning symptoms in the interval. The patient voices understanding of current disease status and treatment options and is in agreement with the current care plan.  All questions were answered. The patient knows to call the clinic with any problems, questions or concerns. We can certainly see the patient much sooner if necessary.  I spent 10 minutes counseling the patient face to face. The total time spent in the appointment was 15 minutes.  Disclaimer: This note was dictated with voice recognition software. Similar sounding words can inadvertently be transcribed and may not be corrected upon review.

## 2018-04-17 ENCOUNTER — Telehealth: Payer: Self-pay | Admitting: Radiation Oncology

## 2018-04-17 DIAGNOSIS — C342 Malignant neoplasm of middle lobe, bronchus or lung: Secondary | ICD-10-CM

## 2018-04-17 NOTE — Telephone Encounter (Signed)
I called to check on the patient following completion of his SBRT. He reports breathing is status quo. I let him know I would order his next CT scan and will follow up with him by phone to review the results. He is in agreement with this plan.

## 2018-04-18 ENCOUNTER — Other Ambulatory Visit: Payer: Self-pay | Admitting: Cardiology

## 2018-04-25 ENCOUNTER — Encounter: Payer: Self-pay | Admitting: Neurology

## 2018-04-25 ENCOUNTER — Ambulatory Visit (INDEPENDENT_AMBULATORY_CARE_PROVIDER_SITE_OTHER): Payer: Medicare Other | Admitting: Neurology

## 2018-04-25 VITALS — BP 112/64 | HR 96 | Ht 69.0 in | Wt 152.3 lb

## 2018-04-25 DIAGNOSIS — G3184 Mild cognitive impairment, so stated: Secondary | ICD-10-CM

## 2018-04-25 MED ORDER — RIVASTIGMINE TARTRATE 1.5 MG PO CAPS: 1.5000 mg | ORAL_CAPSULE | Freq: Two times a day (BID) | ORAL | 3 refills | Status: DC

## 2018-04-25 NOTE — Patient Instructions (Signed)
We will start a very low dose of Exelon 1.5 mg capsule taking one twice a day.

## 2018-04-25 NOTE — Progress Notes (Signed)
Reason for visit: Memory disturbance  Raymond Logan is an 85 y.o. male  History of present illness:  Raymond Logan is an 85 year old right-handed white male with a history of a progressive memory disturbance.  The patient indicates that his wife passed away on March 17, 2018.  He is now living alone.  He does have 2 daughters who live near [place], they will come by to check on him on occasion.  The patient is still able to operate a motor vehicle, he uses GPS regularly to help navigate the city.  The patient has not had any safety issues associated with this.  He has a pill dispenser that holds 30 days of medication and it will beep when medication dosing is due.  The patient is able to cook for himself, he has gained 4 pounds of weight since August 2019.  He is sleeping fairly well at night.  Approximately 2 months after he started Namenda he had an episode of what sounds like a mild delirium state, he was taken off of Namenda at that time.  In the past, he has not tolerated Aricept because of headache and insomnia.  The patient believes that his memory is gradually worsening.  He is managing keeping up with his appointments using a calendar, he is able to function relatively independently at this time.  He returns for an evaluation.  Past Medical History:  Diagnosis Date  . ALLERGIC RHINITIS 01/23/2007  . Arthritis    hands  . BENIGN PROSTATIC HYPERTROPHY 09/10/2006  . Chronic headaches   . Chronic sinusitis   . COPD, MILD 04/03/2010   patient denies on preop visit of 08/02/2014   . Difficulty in urination   . Dyspnea   . FATIGUE 01/08/2008  . GERD 04/15/2010  . H. pylori infection    hx of   . Headache(784.0) 09/10/2006  . HOARSENESS 04/15/2010  . HYPERLIPIDEMIA 09/10/2006  . INSOMNIA-SLEEP DISORDER-UNSPEC 04/24/2009  . Lung cancer ([place])   . PAF (paroxysmal atrial fibrillation) ([place])    identified on EKG 04/08/17  . PEPTIC ULCER DISEASE 01/23/2007  . Pneumonia    1955    Past  Surgical History:  Procedure Laterality Date  . CATARACT EXTRACTION Left   . COLONOSCOPY    . CYSTOSCOPY N/A 10/07/2016   Procedure: CYSTOSCOPY;  Surgeon: Festus Aloe, MD;  Location: [facility];  Service: Urology;  Laterality: N/A;  . FUDUCIAL PLACEMENT Right 02/06/2018   Procedure: PLACEMENT OF FIDUCIAL RIGHT LOWER LOBE LUNG;  Surgeon: [name], MD;  Location: [facility];  Service: Thoracic;  Laterality: Right;  . HEMORRHOID BANDING    . INSERTION OF SUPRAPUBIC CATHETER N/A 10/07/2016   Procedure: FOLEY  CATHETER PLACEMENT;  Surgeon: Festus Aloe, MD;  Location: [facility];  Service: Urology;  Laterality: N/A;  . KNEE ARTHROSCOPY Left    torn meniscus  . LUMBAR LAMINECTOMY/DECOMPRESSION MICRODISCECTOMY Right 08/07/2014   Procedure: complete DECOMPRESSION LUMBAR LAMINECTOMY/MICRODISCECTOMY OF L4-L5 for spinal stenosis, DECOMPRESSION OF L3-L4;  Surgeon: Latanya Maudlin, MD;  [facility];  Service: Orthopedics;  Laterality: Right;  . NASAL SINUS SURGERY Left 07/04/2017   Procedure: LEFT ENDOSCOPIC SINUS SURGERY;  Surgeon: Jerrell Belfast, MD;  Location: [facility];  Service: ENT;  Laterality: Left;  . RIGHT/LEFT HEART CATH AND CORONARY ANGIOGRAPHY N/A 06/21/2017   Procedure: RIGHT/LEFT HEART CATH AND CORONARY ANGIOGRAPHY;  Surgeon: Belva Crome, MD;  Location: [facility];  Service: Cardiovascular;  Laterality: N/A;  . ROTATOR CUFF REPAIR Right   .  SHOULDER ARTHROSCOPY     x3, L & R  . SINUS ENDO WITH FUSION N/A 07/04/2017   Procedure: SINUS ENDO WITH FUSION;  Surgeon: Jerrell Belfast, MD;  Location: [facility];  Service: ENT;  Laterality: N/A;  . VIDEO ASSISTED THORACOSCOPY (VATS)/ LOBECTOMY Right 10/07/2016   Procedure: VIDEO ASSISTED THORACOSCOPY (VATS)/RIGHT MIDDLE LOBECTOMY;  Surgeon: [name], MD;  Location: [facility];  Service: Thoracic;  Laterality: Right;  [name] VIDEO BRONCHOSCOPY WITH ENDOBRONCHIAL NAVIGATION N/A 09/23/2016   Procedure: VIDEO BRONCHOSCOPY WITH ENDOBRONCHIAL  NAVIGATION;  Surgeon: [name], MD;  Location: [facility];  Service: Thoracic;  Laterality: N/A;  . VIDEO BRONCHOSCOPY WITH ENDOBRONCHIAL NAVIGATION N/A 02/06/2018   Procedure: VIDEO BRONCHOSCOPY WITH ENDOBRONCHIAL NAVIGATION;  Surgeon: [name], MD;  [facility];  Service: Thoracic;  Laterality: N/A;  . VIDEO BRONCHOSCOPY WITH ENDOBRONCHIAL ULTRASOUND N/A 09/23/2016   Procedure: VIDEO BRONCHOSCOPY WITH ENDOBRONCHIAL ULTRASOUND;  Surgeon: [name], MD;  [facility];  Service: Thoracic;  Laterality: N/A;    Family History  Problem Relation Age of Onset  . Prostate cancer Brother   . Alzheimer's disease Mother   . Alzheimer's disease Sister     Social history:  reports that he quit smoking about 42 years ago. He has a 23.00 pack-year smoking history. He has never used smokeless tobacco. He reports current alcohol use. He reports that he does not use drugs.    Allergies  Allergen Reactions  . Alfuzosin Other (See Comments) and Hypertension    BP went crazy, dizzy, passing out   . Aricept [Donepezil Hcl] Other (See Comments)    Insomnia, headache  . Penicillins Hives and Other (See Comments)    A PCN REACTION WITH IMMEDIATE RASH, FACIAL/TONGUE/THROAT SWELLING, SOB, OR LIGHTHEADEDNESS WITH HYPOTENSION:  #  #  #  YES  #  #  #   Has patient had a PCN reaction causing severe rash involving mucus membranes or skin necrosis:No Has patient had a PCN reaction that required hospitalization:No Has patient had a PCN reaction occurring within the last 10 years:No If all of the above answers are "NO", then may proceed with Cephalosporin use.     Medications:  Prior to Admission medications   Medication Sig Start Date End Date Taking? Authorizing Provider  albuterol (PROVENTIL HFA;VENTOLIN HFA) 108 (90 Base) MCG/ACT inhaler Inhale 2 puffs into the lungs every 6 (six) hours as needed for wheezing or shortness of breath. 04/12/18  Yes Biagio Borg, MD  calcium  carbonate (TUMS - DOSED IN MG ELEMENTAL CALCIUM) 500 MG chewable tablet Chew 1 tablet by mouth daily as needed for indigestion or heartburn.    Yes [provider]  ELIQUIS 5 MG TABS tablet TAKE 1 TABLET TWICE A DAY 04/18/18  Yes Evans Lance, MD  finasteride (PROSCAR) 5 MG tablet Take 5 mg by mouth at bedtime.    Yes [provider]  fluticasone (FLONASE) 50 MCG/ACT nasal spray Place 2 sprays into both nostrils daily as needed for allergies or rhinitis.  05/28/17  Yes [provider]  furosemide (LASIX) 20 MG tablet Take 1 tablet (20 mg total) by mouth daily. 07/21/17  Yes Evans Lance, MD  propafenone (RYTHMOL) 225 MG tablet Take 1 tablet (225 mg total) by mouth 2 (two) times daily. 07/21/17  Yes Evans Lance, MD  silodosin (RAPAFLO) 8 MG CAPS capsule Take 8 mg by mouth at bedtime.    Yes [provider]  zolpidem (AMBIEN) 5  MG tablet Take 1 tablet (5 mg total) by mouth at bedtime as needed. for sleep 04/12/18  Yes Biagio Borg, MD    ROS:  Out of a complete 14 system review of symptoms, the patient complains only of the following symptoms, and all other reviewed systems are negative.  Frequency of urination Joint pain, left knee pain, walking difficulty Memory loss  Blood pressure 112/64, pulse 96, height 5\' 9"  (1.753 m), weight 152 lb 5 oz (69.1 kg), SpO2 96 %.  Physical Exam  General: The patient is alert and cooperative at the time of the examination.  Skin: No significant peripheral edema is noted.   Neurologic Exam  Mental status: The patient is alert and oriented x 3 at the time of the examination. The Mini-Mental status examination done today shows a total score 27/30.   Cranial nerves: Facial symmetry is present. Speech is normal, no aphasia or dysarthria is noted. Extraocular movements are full. Visual fields are full.  Motor: The patient has good strength in all 4 extremities.  Sensory examination: Soft touch sensation is  symmetric on the face, arms, and legs.  Coordination: The patient has good finger-nose-finger and heel-to-shin bilaterally.  No evidence of apraxia was seen.  Gait and station: The patient has a normal gait. Tandem gait was not tested. Romberg is negative. No drift is seen.  Reflexes: Deep tendon reflexes are symmetric.   Assessment/Plan:  1.  Memory disturbance  The patient will be given a trial on very low-dose Exelon taking 1.5 mg twice daily, if he develops headaches or other side effects, he is to contact our office.  He will follow-up in 6 months.  Jill Alexanders MD 04/25/2018 12:20 PM  [facility] [address] [place], [place] [address]  Phone [phone] Fax [phone]

## 2018-05-01 ENCOUNTER — Ambulatory Visit: Payer: Self-pay | Admitting: Urology

## 2018-05-01 ENCOUNTER — Ambulatory Visit: Payer: Self-pay | Admitting: Radiation Oncology

## 2018-05-02 ENCOUNTER — Other Ambulatory Visit: Payer: Medicare Other | Admitting: Licensed Clinical Social Worker

## 2018-05-02 DIAGNOSIS — Z515 Encounter for palliative care: Secondary | ICD-10-CM

## 2018-05-03 NOTE — Progress Notes (Signed)
COMMUNITY PALLIATIVE CARE SW NOTE  PATIENT NAME: Raymond Logan DOB: 01-25-1931 MRN: [id number]  PRIMARY CARE PROVIDER: Biagio Borg, MD  RESPONSIBLE PARTY:  Acct ID - Guarantor Home Phone Work Phone Relationship Acct Type  [id number] [name]  Self P/F     [address], [place], [place] [address]     PLAN OF CARE and INTERVENTIONS:             1. GOALS OF CARE/ ADVANCE CARE PLANNING:  Patient's primary goal is to stay in his home.  He is full code and has a MOST form. 2. SOCIAL/EMOTIONAL/SPIRITUAL ASSESSMENT/ INTERVENTIONS:  SW met with patient in his home.  His daughter, [name], who was going to live with patient after his wife died, has been out of state caring for a sick friend.  Her return is unclear.  Patient reports becoming weaker faster, especially after he dresses himself.  He had several papers scattered in the kitchen and stated he has lost two of his cell phones.  He keeps the home phone with him.  He also purchased a puppy recently, which requires attendance.  SW provided bereavement counseling as he discussed the many things he misses about his wife. 3. PATIENT/CAREGIVER EDUCATION/ COPING:  Patient expressing his feelings openly. 4. PERSONAL EMERGENCY PLAN:  Patient will rest when he becomes fatigued.  He keeps a phone with him and will call EMS or a daughter if needed. 5. COMMUNITY RESOURCES COORDINATION/ HEALTH CARE NAVIGATION:  None. 6. FINANCIAL/LEGAL CONCERNS/INTERVENTIONS:  None.     SOCIAL HX:  Social History   Tobacco Use  . Smoking status: Former Smoker    Packs/day: 1.00    Years: 23.00    Pack years: 23.00    Last attempt to quit: 1978    Years since quitting: 42.2  . Smokeless tobacco: Never Used  Substance Use Topics  . Alcohol use: Yes    Comment: rare    CODE STATUS:  Full Code  ADVANCED DIRECTIVES: N MOST FORM COMPLETE:  Y HOSPICE EDUCATION PROVIDED: N PPS:  Patient stated that his appetite is normal.  He ambulates independently  and still drives. Duration of visit and documentation:  75 minutes.      Creola Corn Syerra Abdelrahman, LCSW

## 2018-05-11 ENCOUNTER — Encounter: Payer: Self-pay | Admitting: Internal Medicine

## 2018-05-12 ENCOUNTER — Other Ambulatory Visit: Payer: Self-pay | Admitting: *Deleted

## 2018-05-12 DIAGNOSIS — C3490 Malignant neoplasm of unspecified part of unspecified bronchus or lung: Secondary | ICD-10-CM

## 2018-05-16 ENCOUNTER — Inpatient Hospital Stay: Payer: Medicare Other | Attending: Internal Medicine

## 2018-05-16 ENCOUNTER — Other Ambulatory Visit: Payer: Self-pay

## 2018-05-16 ENCOUNTER — Ambulatory Visit (HOSPITAL_COMMUNITY)
Admission: RE | Admit: 2018-05-16 | Discharge: 2018-05-16 | Disposition: A | Discharge status: Home / Self Care | Payer: Medicare Other | Source: Ambulatory Visit | Attending: Radiation Oncology | Admitting: Radiation Oncology

## 2018-05-16 DIAGNOSIS — J90 Pleural effusion, not elsewhere classified: Secondary | ICD-10-CM | POA: Diagnosis not present

## 2018-05-16 DIAGNOSIS — C342 Malignant neoplasm of middle lobe, bronchus or lung: Secondary | ICD-10-CM | POA: Insufficient documentation

## 2018-05-16 DIAGNOSIS — C3490 Malignant neoplasm of unspecified part of unspecified bronchus or lung: Secondary | ICD-10-CM

## 2018-05-16 LAB — BASIC METABOLIC PANEL - CANCER CENTER ONLY
Anion gap: 12 (ref 5–15)
BUN: 13 mg/dL (ref 8–23)
CO2: 24 mmol/L (ref 22–32)
Calcium: 9.2 mg/dL (ref 8.9–10.3)
Chloride: 105 mmol/L (ref 98–111)
Creatinine: 1.18 mg/dL (ref 0.61–1.24)
GFR, Est AFR Am: >60 mL/min (ref >60)
GFR, Estimated: 56 mL/min — ABNORMAL LOW (ref >60)
GLUCOSE: 95 mg/dL (ref 70–99)
Potassium: 3.9 mmol/L (ref 3.5–5.1)
Sodium: 141 mmol/L (ref 135–145)

## 2018-05-16 MED ORDER — SODIUM CHLORIDE (PF) 0.9 % IJ SOLN
INTRAMUSCULAR | Status: AC
  Filled 2018-05-16: qty 50

## 2018-05-16 MED ORDER — IOHEXOL 300 MG/ML  SOLN
75.0000 mL | Freq: Once | INTRAMUSCULAR | Status: AC | PRN
  Administered 2018-05-16: 75 mL via INTRAVENOUS

## 2018-05-17 ENCOUNTER — Telehealth: Payer: Self-pay | Admitting: Radiation Oncology

## 2018-05-17 DIAGNOSIS — C342 Malignant neoplasm of middle lobe, bronchus or lung: Secondary | ICD-10-CM

## 2018-05-17 NOTE — Telephone Encounter (Signed)
I called and let the patient know his results from his CT scan and the plan to repeat this in 6 months time. He is in agreement and will call us if he has questions or concerns.

## 2018-05-18 ENCOUNTER — Encounter: Payer: Self-pay | Admitting: Internal Medicine

## 2018-05-27 ENCOUNTER — Other Ambulatory Visit: Payer: Self-pay | Admitting: Internal Medicine

## 2018-06-06 ENCOUNTER — Other Ambulatory Visit: Payer: Self-pay

## 2018-06-06 ENCOUNTER — Other Ambulatory Visit: Payer: Medicare Other | Admitting: Licensed Clinical Social Worker

## 2018-06-06 ENCOUNTER — Telehealth: Payer: Self-pay | Admitting: Licensed Clinical Social Worker

## 2018-06-06 DIAGNOSIS — Z515 Encounter for palliative care: Secondary | ICD-10-CM

## 2018-06-06 NOTE — Progress Notes (Signed)
COMMUNITY PALLIATIVE CARE SW NOTE  PATIENT NAME: Raymond Logan DOB: 1930-08-01 MRN: [id number]  PRIMARY CARE PROVIDER: Biagio Borg, MD  RESPONSIBLE PARTY:  Acct ID - Guarantor Home Phone Work Phone Relationship Acct Type  [id number] SABA, [phone]  Self P/F     [address], [place], [place] [address]   Due to the COVID-19 crisis, this virtual check-in visit was done via telephone from my office and it was initiated and consent given by this patient and or family.  PLAN OF CARE and INTERVENTIONS:             1. GOALS OF CARE/ ADVANCE CARE PLANNING:  Goal for patient is to remain in his home.  Patient is a full code and has a MOST form. 2. SOCIAL/EMOTIONAL/SPIRITUAL ASSESSMENT/ INTERVENTIONS:  SW 3. PATIENT/CAREGIVER EDUCATION/ COPING:  Patient copes by expressing his feelings openly.  Provided education regarding virtual check-in visits and he stated he understood.  He would like to use the phone as his primary communication at this time.  Patient has been informed he is at high risk for contracting COVID-19 and is remaining at home.  He is having his food delivered.  Patient states he only experiences back pain if he has been sitting for several minutes.  His daughter, [name], has not returned to  yet, therefore, patient is living alone.  He states his new puppy keeps him active and engaged. 4. PERSONAL EMERGENCY PLAN:  Patient keeps a phone close by.  He will rest when fatigued and does not overly exert himself. 5. COMMUNITY RESOURCES COORDINATION/ HEALTH CARE NAVIGATION:  None. 6. FINANCIAL/LEGAL CONCERNS/INTERVENTIONS:  None.     SOCIAL HX:  Social History   Tobacco Use  . Smoking status: Former Smoker    Packs/day: 1.00    Years: 23.00    Pack years: 23.00    Last attempt to quit: 1978    Years since quitting: 42.2  . Smokeless tobacco: Never Used  Substance Use Topics  . Alcohol use: Yes    Comment: rare    CODE STATUS:  Full Code ADVANCED DIRECTIVES: N MOST  FORM COMPLETE:  Y HOSPICE EDUCATION PROVIDED:  N PPS:  Patient reports his appetite is normal.  He is able to ambulate independently. Duration of visit and documentation:  30 minutes.      Creola Corn Zarek Relph, LCSW

## 2018-06-06 NOTE — Telephone Encounter (Signed)
Palliative Care SW spoke with patient who agreed to a virtual check-in visit.

## 2018-07-10 ENCOUNTER — Other Ambulatory Visit: Payer: Medicare Other

## 2018-07-12 ENCOUNTER — Inpatient Hospital Stay: Payer: Medicare Other | Attending: Internal Medicine | Admitting: Internal Medicine

## 2018-07-12 ENCOUNTER — Encounter: Payer: Self-pay | Admitting: Internal Medicine

## 2018-07-12 DIAGNOSIS — C342 Malignant neoplasm of middle lobe, bronchus or lung: Secondary | ICD-10-CM | POA: Diagnosis not present

## 2018-07-12 NOTE — Progress Notes (Signed)
Raymond [phone]   Fax:([phone]  PROGRESS NOTE FOR TELEMEDICINE VISITS  Raymond Borg, MD [place] [address]  I connected with@ on 07/12/18 at 10:15 AM EDT by Logan visit and verified that I am speaking with the correct person using two identifiers.   I discussed the limitations, risks, security and privacy concerns of performing an evaluation and management service by telemedicine and the availability of in-person appointments. I also discussed with the patient that there may be a patient responsible charge related to this service. The patient expressed understanding and agreed to proceed.  Other persons participating in the visit and their role in the encounter:  Daughter  Patient's location:  Home Provider's location: [place]  DIAGNOSIS:stage IA(T1c, N0, M0) non-small cell lung cancer, adenocarcinoma status post right middle lobectomy with lymph node dissection withvery close vascular resection margin diagnosed in July 2018.  He has recurrent adenocarcinoma of the right lower lobe in December 2020.  PRIOR THERAPY: 1)status post right middle lobectomy with lymph node dissection withvery close vascular resection margin in July 2018. 2)radiation to the right lung 11/18/2016 through 12/30/2016. 3) status post curative radiotherapy to the right lower lobe pulmonary nodule under the care of Dr. [name] completed on March 07, 2018.  CURRENT THERAPY:Observation  INTERVAL HISTORY: Raymond Logan 85 y.o. male has a Logan virtual visit with me today for evaluation and discussion of his condition.  His daughter was available during the phone call.  The patient is feeling fine today with no concerning complaints.  He denied having any chest pain but has shortness of breath with exertion with no cough or hemoptysis.  He denied having any fever or chills.  He has no nausea, vomiting, diarrhea or  constipation.  He lost 3 pounds since his last visit.  He had scan of the chest performed in March 2020 and we are having the visit for evaluation of his condition and discussion of the scan results.  MEDICAL HISTORY: Past Medical History:  Diagnosis Date   ALLERGIC RHINITIS 01/23/2007   Arthritis    hands   BENIGN PROSTATIC HYPERTROPHY 09/10/2006   Chronic headaches    Chronic sinusitis    COPD, MILD 04/03/2010   patient denies on preop visit of 08/02/2014    Difficulty in urination    Dyspnea    FATIGUE 01/08/2008   GERD 04/15/2010   H. pylori infection    hx of    Headache(784.0) 09/10/2006   HOARSENESS 04/15/2010   HYPERLIPIDEMIA 09/10/2006   INSOMNIA-SLEEP DISORDER-UNSPEC 04/24/2009   Lung cancer ([place] [facility])    PAF (paroxysmal atrial fibrillation) ([place])    identified on EKG 04/08/17   PEPTIC ULCER DISEASE 01/23/2007   Pneumonia    1955    ALLERGIES:  is allergic to alfuzosin; aricept [donepezil hcl]; and penicillins.  MEDICATIONS:  Current Outpatient Medications  Medication Sig Dispense Refill   albuterol (PROVENTIL HFA;VENTOLIN HFA) 108 (90 Base) MCG/ACT inhaler Inhale 2 puffs into the lungs every 6 (six) hours as needed for wheezing or shortness of breath. 1 Inhaler 11   calcium carbonate (TUMS - DOSED IN MG ELEMENTAL CALCIUM) 500 MG chewable tablet Chew 1 tablet by mouth daily as needed for indigestion or heartburn.      ELIQUIS 5 MG TABS tablet TAKE 1 TABLET TWICE A DAY 180 tablet 2   finasteride (PROSCAR) 5 MG tablet Take 5 mg by mouth at bedtime.  fluticasone (FLONASE) 50 MCG/ACT nasal spray Place 2 sprays into both nostrils daily as needed for allergies or rhinitis.   11   furosemide (LASIX) 20 MG tablet Take 1 tablet (20 mg total) by mouth daily. 90 tablet 3   propafenone (RYTHMOL) 225 MG tablet Take 1 tablet (225 mg total) by mouth 2 (two) times daily. Pt needs to call and make appt for further refills. Thanks. 180 tablet 0   rivastigmine (EXELON)  1.5 MG capsule Take 1 capsule (1.5 mg total) by mouth 2 (two) times daily. 60 capsule 3   silodosin (RAPAFLO) 8 MG CAPS capsule Take 8 mg by mouth at bedtime.      zolpidem (AMBIEN) 5 MG tablet Take 1 tablet (5 mg total) by mouth at bedtime as needed. for sleep 30 tablet 5   No current facility-administered medications for this visit.     SURGICAL HISTORY:  Past Surgical History:  Procedure Laterality Date   CATARACT EXTRACTION Left    COLONOSCOPY     CYSTOSCOPY N/A 10/07/2016   Procedure: CYSTOSCOPY;  Surgeon: Festus Aloe, MD;  Location: [facility];  Service: Urology;  Laterality: N/A;   FUDUCIAL PLACEMENT Right 02/06/2018   Procedure: PLACEMENT OF FIDUCIAL RIGHT LOWER LOBE LUNG;  Surgeon: [name], MD;  Location: [facility];  Service: Thoracic;  Laterality: Right;   HEMORRHOID BANDING     INSERTION OF SUPRAPUBIC CATHETER N/A 10/07/2016   Procedure: FOLEY  CATHETER PLACEMENT;  Surgeon: Festus Aloe, MD;  Location: [facility];  Service: Urology;  Laterality: N/A;   KNEE ARTHROSCOPY Left    torn meniscus   LUMBAR LAMINECTOMY/DECOMPRESSION MICRODISCECTOMY Right 08/07/2014   Procedure: complete DECOMPRESSION LUMBAR LAMINECTOMY/MICRODISCECTOMY OF L4-L5 for spinal stenosis, DECOMPRESSION OF L3-L4;  Surgeon: Latanya Maudlin, MD;  [facility];  Service: Orthopedics;  Laterality: Right;   NASAL SINUS SURGERY Left 07/04/2017   Procedure: LEFT ENDOSCOPIC SINUS SURGERY;  Surgeon: Jerrell Belfast, MD;  Location: [facility];  Service: ENT;  Laterality: Left;   RIGHT/LEFT HEART CATH AND CORONARY ANGIOGRAPHY N/A 06/21/2017   Procedure: RIGHT/LEFT HEART CATH AND CORONARY ANGIOGRAPHY;  Surgeon: Belva Crome, MD;  Location: [facility];  Service: Cardiovascular;  Laterality: N/A;   ROTATOR CUFF REPAIR Right    SHOULDER ARTHROSCOPY     x3, L & R   SINUS ENDO WITH FUSION N/A 07/04/2017   Procedure: SINUS ENDO WITH FUSION;  Surgeon: Jerrell Belfast, MD;  Location: [facility];  Service:  ENT;  Laterality: N/A;   VIDEO ASSISTED THORACOSCOPY (VATS)/ LOBECTOMY Right 10/07/2016   Procedure: VIDEO ASSISTED THORACOSCOPY (VATS)/RIGHT MIDDLE LOBECTOMY;  Surgeon: [name], MD;  Location: [facility];  Service: Thoracic;  Laterality: Right;   VIDEO BRONCHOSCOPY WITH ENDOBRONCHIAL NAVIGATION N/A 09/23/2016   Procedure: VIDEO BRONCHOSCOPY WITH ENDOBRONCHIAL NAVIGATION;  Surgeon: [name], MD;  Location: [facility];  Service: Thoracic;  Laterality: N/A;   VIDEO BRONCHOSCOPY WITH ENDOBRONCHIAL NAVIGATION N/A 02/06/2018   Procedure: VIDEO BRONCHOSCOPY WITH ENDOBRONCHIAL NAVIGATION;  Surgeon: [name], MD;  Location: [facility];  Service: Thoracic;  Laterality: N/A;   VIDEO BRONCHOSCOPY WITH ENDOBRONCHIAL ULTRASOUND N/A 09/23/2016   Procedure: VIDEO BRONCHOSCOPY WITH ENDOBRONCHIAL ULTRASOUND;  Surgeon: [name], MD;  [facility];  Service: Thoracic;  Laterality: N/A;    REVIEW OF SYSTEMS:  A comprehensive review of systems was negative except for: Respiratory: positive for dyspnea on exertion   LABORATORY DATA: Lab Results  Component Value Date   WBC 4.3 04/07/2018  HGB 12.4 (L) 04/07/2018   HCT 36.3 (L) 04/07/2018   MCV 89.2 04/07/2018   PLT 198 04/07/2018      Chemistry      Component Value Date/Time   NA 141 05/16/2018 1433   NA 139 08/02/2017 1242   NA 142 11/04/2016 1334   K 3.9 05/16/2018 1433   K 4.0 11/04/2016 1334   CL 105 05/16/2018 1433   CO2 24 05/16/2018 1433   CO2 26 11/04/2016 1334   BUN 13 05/16/2018 1433   BUN 15 08/02/2017 1242   BUN 15.4 11/04/2016 1334   CREATININE 1.18 05/16/2018 1433   CREATININE 1.2 11/04/2016 1334      Component Value Date/Time   CALCIUM 9.2 05/16/2018 1433   CALCIUM 9.3 11/04/2016 1334   ALKPHOS 113 04/07/2018 1104   ALKPHOS 91 11/04/2016 1334   AST 12 (L) 04/07/2018 1104   AST 14 11/04/2016 1334   ALT <6 04/07/2018 1104   ALT 10 11/04/2016 1334   BILITOT 0.7 04/07/2018 1104   BILITOT  0.37 11/04/2016 1334       RADIOGRAPHIC STUDIES: No results found.  ASSESSMENT AND PLAN:  This is a very pleasant 85 years old white male with history of stage Ia non-small cell lung cancer, adenocarcinoma status post right middle lobectomy with lymph node dissection in July 2018.  The patient has recurrence of adenocarcinoma with new nodule in the right lower lobe diagnosed in December 2019.  He underwent stereotactic body radiotherapy to this lesion completed January 2020. The patient is feeling fine today with no concerning complaints except for shortness of breath with exertion.   His last scan showed no concerning findings for disease progression. I discussed the scan results with the patient and his daughter recommended for him to continue on observation with repeat CT scan of the chest 6 months from the previous 1. He will have a follow-up appointment at that time. The patient was advised to call immediately if he has any concerning symptoms in the interval. I discussed the assessment and treatment plan with the patient. The patient was provided an opportunity to ask questions and all were answered. The patient agreed with the plan and demonstrated an understanding of the instructions.   The patient was advised to call back or seek an in-person evaluation if the symptoms worsen or if the condition fails to improve as anticipated.  I provided 11 minutes of non face-to-face Logan visit time during this encounter, and > 50% was spent counseling as documented under my assessment & plan.  Eilleen Kempf, MD 07/12/2018 10:45 AM  Disclaimer: This note was dictated with voice recognition software. Similar sounding words can inadvertently be transcribed and may not be corrected upon review.

## 2018-07-13 ENCOUNTER — Telehealth: Payer: Self-pay | Admitting: Internal Medicine

## 2018-07-13 NOTE — Telephone Encounter (Signed)
Scheduled appt per 5/13 los - per patient . Letter to be mailed

## 2018-07-14 ENCOUNTER — Other Ambulatory Visit: Payer: Medicare Other | Admitting: Licensed Clinical Social Worker

## 2018-07-14 ENCOUNTER — Other Ambulatory Visit: Payer: Self-pay

## 2018-07-14 DIAGNOSIS — Z515 Encounter for palliative care: Secondary | ICD-10-CM

## 2018-07-17 NOTE — Progress Notes (Signed)
COMMUNITY PALLIATIVE CARE SW NOTE  PATIENT NAME: Raymond Logan DOB: Aug 16, 1930 MRN: [id number]  PRIMARY CARE PROVIDER: Biagio Borg, MD  RESPONSIBLE PARTY:  Acct ID - Guarantor Home Phone Work Phone Relationship Acct Type  [id number] TREVOR, [phone]  Self P/F     [address] [place], [place] [address]    Due to the COVID-19 crisis, this virtual check-in visit was done via telephone from my office and it was initiated and consent given by this patient and or family.   PLAN OF CARE and INTERVENTIONS:             1. GOALS OF CARE/ ADVANCE CARE PLANNING:  Patient's goal is to remain in his home.  He is a full code. 2. SOCIAL/EMOTIONAL/SPIRITUAL ASSESSMENT/ INTERVENTIONS:  SW conducted a virtual check-in visit with patient in his home.  His daughter, [name], has returned from [place] to help care for him.  Since the [facility] is closed, he has not been able to play table tennis.  He stated that was his gauge for how well he was doing.  If he was able to play several games, he felt he was doing well.   He does take his new dog for a walk, which he enjoys.  SW provided active listening and supportive counseling. 3. PATIENT/CAREGIVER EDUCATION/ COPING:  Patient copes by expressing his feelings openly. 4. PERSONAL EMERGENCY PLAN:  Patient will rest when he becomes fatigued.  His family will contact EMS if needed. 5. COMMUNITY RESOURCES COORDINATION/ HEALTH CARE NAVIGATION: None.  6. FINANCIAL/LEGAL CONCERNS/INTERVENTIONS:  None.     SOCIAL HX:  Social History   Tobacco Use  . Smoking status: Former Smoker    Packs/day: 1.00    Years: 23.00    Pack years: 23.00    Last attempt to quit: 1978    Years since quitting: 42.4  . Smokeless tobacco: Never Used  Substance Use Topics  . Alcohol use: Yes    Comment: rare    CODE STATUS:  Full Code  ADVANCED DIRECTIVES: N MOST FORM COMPLETE:  Y HOSPICE EDUCATION PROVIDED:  N PPS:  Patient reports that his appetite is normal.   He ambulates independently. Duration of visit and documentation:  30 minutes.      Creola Corn Cheray Pardi, LCSW

## 2018-08-01 MED ORDER — PROPAFENONE HCL 225 MG PO TABS: 225.0000 mg | ORAL_TABLET | Freq: Two times a day (BID) | ORAL | 0 refills | Status: DC

## 2018-08-01 MED ORDER — FUROSEMIDE 20 MG PO TABS: 20.0000 mg | ORAL_TABLET | Freq: Every day | ORAL | 0 refills | Status: DC

## 2018-08-01 NOTE — Telephone Encounter (Signed)
Pt's medications were sent to pt's pharmacy as requested. Confirmation received.  

## 2018-08-08 ENCOUNTER — Other Ambulatory Visit: Payer: Medicare Other | Admitting: *Deleted

## 2018-08-08 ENCOUNTER — Other Ambulatory Visit: Payer: Self-pay

## 2018-08-08 DIAGNOSIS — Z515 Encounter for palliative care: Secondary | ICD-10-CM

## 2018-08-08 NOTE — Progress Notes (Signed)
COMMUNITY PALLIATIVE CARE RN NOTE  PATIENT NAME: Raymond Logan DOB: 11/20/1930 MRN: [id number]  PRIMARY CARE PROVIDER: Biagio Borg, MD  RESPONSIBLE PARTY:  Acct ID - Guarantor Home Phone Work Phone Relationship Acct Type  [id number] [name], [name]  Self P/F     [address], [place], [place] [address]   Due to the COVID-19 crisis, this virtual check-in visit was done via telephone from my office and it was initiated and consent by this patient and or family.  PLAN OF CARE and INTERVENTION:  1. ADVANCE CARE PLANNING/GOALS OF CARE: Goal is for patient to remain in his home.  2. PATIENT/CAREGIVER EDUCATION: Reinforced Safe Mobility 3. DISEASE STATUS: Virtual check-in visit completed with patient via telephone. Patient reports that he has been doing ok. He denies pain. He continues with some mild dyspnea with exertion, but recovers easily and is not distressing. He does have a non-productive, dry cough but uses Lozenges which are helpful. He feels that his appetite is adequate. He is taking his medications without difficulty. Denies dysphagia. He remains able to perform all ADLs independently. He is ambulatory without any assistive devices. Forgetful at times, but able to engage in meaningful conversation. He used to go to the [facility] 2x/week to play table tennis, however the center has been closed since the start of Covid-19 pandemic. He has been taking walks outside in his neighborhood and enjoying the company of his new dog. He continues with a Left knee brace to help with stability. His daughter is living with him, so is cooking for patient and performing household chores which he says is a big help. His next Oncology appointment is in September. He has no questions or concerns at this time. Will continue to monitor.    HISTORY OF PRESENT ILLNESS:  This is a 85 yo male who resides in his home. His daughter now lives with him. Palliative Care Team continues to follow patient. Will  continue to check in with patient monthly and PRN.  CODE STATUS: Full Code  ADVANCED DIRECTIVES: Y (Living Will, HCPOA) MOST FORM: yes PPS: 60%  (Duration of visit and documentation 45 minutes)   Daryl Eastern, RN BSN

## 2018-08-18 ENCOUNTER — Other Ambulatory Visit: Payer: Self-pay | Admitting: Cardiology

## 2018-08-18 NOTE — Telephone Encounter (Signed)
Pt last saw Dr Lovena Le 11/02/17, last labs 05/16/18 Creat 1.18, age 85, weight 69.1kg, based on specified criteria pt is on appropriate dosage of Eliquis 5mg  BID.  Will refill rx.

## 2018-08-18 NOTE — Telephone Encounter (Signed)
°*  STAT* If patient is at the pharmacy, call can be transferred to refill team.   1. Which medications need to be refilled? (please list name of each medication and dose if known)  Eliquis- need this called in today please  2. Which pharmacy/location (including street and city if local pharmacy) is medication to be sent to [place]  3. Do they need a 30 day or 90 day supply? * 180 and refills

## 2018-08-25 ENCOUNTER — Other Ambulatory Visit: Payer: Self-pay | Admitting: Neurology

## 2018-09-04 ENCOUNTER — Encounter: Payer: Self-pay | Admitting: Internal Medicine

## 2018-09-04 ENCOUNTER — Telehealth: Payer: Self-pay | Admitting: Medical Oncology

## 2018-09-04 NOTE — Telephone Encounter (Signed)
I spoke to the daughter [name] who spent a significant amount of time on the phone trying to convince me to encourage her father to use CBD oil which is a product of the company she works for.  I explained to the daughter that I have no opinion regarding CBD oil unless it is FDA approved.  She also encourage me to watch Netflix episodes related to CBD oil.  I explained to the patient that I do not have the time to watch Netflix episodes but I will do in the future if I have time.  Regarding his dry cough, this is likely from his previous radiotherapy.  He was encouraged to use over-the-counter cough medications if needed.  He will be routinely monitored with CT scan for any evidence of disease progression.

## 2018-09-04 NOTE — Telephone Encounter (Signed)
See second message sent at 1:16 am.

## 2018-09-04 NOTE — Telephone Encounter (Signed)
CT from 3/18-Jean ( Dtr) is requesting a call from his "oncologist "to go over his scan from march .  She is concerned that he is coughing a lot now.  Of note [name] said she was on the phone during pt visit with [facility] on 5/13 when scan was reviewed .  She  is insisting that Dr Julien Nordmann call her and  go over his scan  And her concern -

## 2018-09-08 ENCOUNTER — Other Ambulatory Visit: Payer: Medicare Other | Admitting: Licensed Clinical Social Worker

## 2018-09-08 ENCOUNTER — Other Ambulatory Visit: Payer: Self-pay

## 2018-09-08 DIAGNOSIS — Z515 Encounter for palliative care: Secondary | ICD-10-CM

## 2018-09-11 NOTE — Progress Notes (Signed)
COMMUNITY PALLIATIVE CARE SW NOTE  PATIENT NAME: Raymond Logan DOB: 19-Jan-1931 MRN: [id number]  PRIMARY CARE PROVIDER: Biagio Borg, MD  RESPONSIBLE PARTY:  Acct ID - Guarantor Home Phone Work Phone Relationship Acct Type  [id number] Raymond [phone]  Self P/F     [address] [place], [place] [address]   Due to the COVID-19 crisis, this virtual check-in visit was done via telephone from my office and it was initiated and consent given by thispatientand or family.  PLAN OF CARE and INTERVENTIONS:             1. GOALS OF CARE/ ADVANCE CARE PLANNING:  Goal is for patient to remain in his home.  Patient is a full code. 2. SOCIAL/EMOTIONAL/SPIRITUAL ASSESSMENT/ INTERVENTIONS:  SW conducted a virtual check-in visit with patient in his home.  His daughter, [name], is living with him.  She cooks and cleans for him.  He expressed appreciation for her care.  SW provided active listening and supportive counseling while patient discussed feeling so isolated and missing his table tennis games at the senior center.  He does have a dog that his daughters bought him which keeps him busy. 3. PATIENT/CAREGIVER EDUCATION/ COPING:  Patient copes by expressing his feelings. 4. PERSONAL EMERGENCY PLAN:  He rests and does not overexert himself. 5. COMMUNITY RESOURCES COORDINATION/ HEALTH CARE NAVIGATION:  None. 6. FINANCIAL/LEGAL CONCERNS/INTERVENTIONS:  None.     SOCIAL HX:  Social History   Tobacco Use  . Smoking status: Former Smoker    Packs/day: 1.00    Years: 23.00    Pack years: 23.00    Quit date: 1978    Years since quitting: 42.5  . Smokeless tobacco: Never Used  Substance Use Topics  . Alcohol use: Yes    Comment: rare    CODE STATUS:   Full Code  ADVANCED DIRECTIVES: N MOST FORM COMPLETE:  Y HOSPICE EDUCATION PROVIDED:  N PPS:  Patient stated that his appetite is normal.  He ambulates independently. Duration of visit and documentation:  30 minutes.      Creola Corn Novak Stgermaine,  LCSW

## 2018-09-27 ENCOUNTER — Other Ambulatory Visit: Payer: Self-pay | Admitting: Internal Medicine

## 2018-10-06 ENCOUNTER — Other Ambulatory Visit: Payer: Medicare Other | Admitting: *Deleted

## 2018-10-06 ENCOUNTER — Other Ambulatory Visit: Payer: Self-pay

## 2018-10-06 DIAGNOSIS — Z515 Encounter for palliative care: Secondary | ICD-10-CM

## 2018-10-09 NOTE — Progress Notes (Signed)
COMMUNITY PALLIATIVE CARE RN NOTE  PATIENT NAME: Raymond Logan DOB: 1930-12-05 MRN: [id number]  PRIMARY CARE PROVIDER: Biagio Borg, MD  RESPONSIBLE PARTY:  Acct ID - Guarantor Home Phone Work Phone Relationship Acct Type  [id number] ALMA, [phone]  Self P/F     [address] [place], [place] [address]   Due to the COVID-19 crisis, this virtual check-in visit was done via telephone from my office and it was initiated and consent by this patient and or family.  PLAN OF CARE and INTERVENTION:  1. ADVANCE CARE PLANNING/GOALS OF CARE: Goal is for patient to remain in his home. Patient is a Full code. 2. PATIENT/CAREGIVER EDUCATION: N/A 3. DISEASE STATUS: Virtual check-in visit completed via telephone. Patient is sounds pleasant and engaging. He is able to engage in appropriate conversation, but is repetitive. He denies pain. His daughter continues to live with him to assist him with cooking and household chores. He remains able to perform all ADLs independently and continues to drive. He celebrated his birthday yesterday with his daughter. He says his intake is good and denies dysphagia. He tries to stay active and is taking walks outside almost daily since he is unable to go to the [facility] to play table tennis. He also gets exercise by taking his dog out.  He is averaging about 3-4 thousand steps most days. There have been no changes to his medication regimen. He has a Dentist appointment on 10/10/18 d/t some broken teeth. He has no issues or concerns to report at this time and feels that his condition is currently stable. Will continue to monitor.  HISTORY OF PRESENT ILLNESS:  This is a 85 yo male who resides at home. Palliative Care team continues to follow patient. Will continue to check in with patient monthly and PRN.  CODE STATUS: Full Code  ADVANCED DIRECTIVES: Y MOST FORM: yes PPS: 60%   (Duration of visit and documentation 30 minutes)    Daryl Eastern, RN BSN

## 2018-10-19 ENCOUNTER — Ambulatory Visit (INDEPENDENT_AMBULATORY_CARE_PROVIDER_SITE_OTHER): Payer: Medicare Other | Admitting: Internal Medicine

## 2018-10-19 ENCOUNTER — Encounter: Payer: Self-pay | Admitting: Internal Medicine

## 2018-10-19 ENCOUNTER — Ambulatory Visit: Payer: Self-pay | Admitting: *Deleted

## 2018-10-19 ENCOUNTER — Telehealth: Payer: Self-pay

## 2018-10-19 DIAGNOSIS — R739 Hyperglycemia, unspecified: Secondary | ICD-10-CM | POA: Diagnosis not present

## 2018-10-19 DIAGNOSIS — J449 Chronic obstructive pulmonary disease, unspecified: Secondary | ICD-10-CM

## 2018-10-19 DIAGNOSIS — R42 Dizziness and giddiness: Secondary | ICD-10-CM | POA: Diagnosis not present

## 2018-10-19 MED ORDER — MECLIZINE HCL 12.5 MG PO TABS: ORAL_TABLET | ORAL | 1 refills | Status: DC

## 2018-10-19 MED ORDER — DIAZEPAM 5 MG PO TABS: ORAL_TABLET | ORAL | 0 refills | Status: DC

## 2018-10-19 MED ORDER — ONDANSETRON HCL 4 MG PO TABS: 4.0000 mg | ORAL_TABLET | Freq: Three times a day (TID) | ORAL | 1 refills | Status: DC | PRN

## 2018-10-19 NOTE — Telephone Encounter (Signed)
Patient scheduled for appointment this morning

## 2018-10-19 NOTE — Assessment & Plan Note (Signed)
stable overall by history and exam, recent data reviewed with pt, and pt to continue medical treatment as before,  to f/u any worsening symptoms or concerns  

## 2018-10-19 NOTE — Patient Instructions (Signed)
Please take all new medication as prescribed  Please continue all other medications as before, and refills have been done if requested.  Please have the pharmacy call with any other refills you may need.  Please continue your efforts at being more active, low cholesterol diet, and weight control.  Please keep your appointments with your specialists as you may have planned    

## 2018-10-19 NOTE — Telephone Encounter (Signed)
Copied from [place] [phone]. Topic: General - Inquiry >> Oct 19, 2018  2:33 PM Virl Axe D wrote: Reason for CRM: Pt's daughter [name] stated she thinks pt needs to be tested for covid-19 and wants to know if Dr. Jenny Reichmann will arrange for pt to be tested in his home due to is age and stated of health. Pt stated she read on practice's website that this can be arranged. Requesting callback. [phone]

## 2018-10-19 NOTE — Assessment & Plan Note (Signed)
C/w positional vertigo but debilitating, for meclizine prn, also valium 5 qd prn if also needed, and zofran prn nause

## 2018-10-19 NOTE — Progress Notes (Signed)
Patient ID: Raymond Logan, male   DOB: [birth year], 85 y.o.   MRN: [id number]  Virtual Visit via Video Note  I connected with Raymond Logan on 10/19/18 at 11:20 AM EDT by a video enabled telemedicine application and verified that I am speaking with the correct person using two identifiers.  Location: Patient: at home Provider: at office   I discussed the limitations of evaluation and management by telemedicine and the availability of in person appointments. The patient expressed understanding and agreed to proceed.  History of Present Illness: Pt seen at home with daughter, and pt lying in bed to help with hx, c/o 2 days onset positional vertigo room spinning, mild to mod intermittent but makes him lie in bed to make better, worse to sit up or turn over. Denies fever, HA, ear or sinus pain or congestion.  Denies ST, though has a chronic cough no change. Pt denies chest pain, increased sob or doe, wheezing, orthopnea, PND, increased LE swelling, palpitations, dizziness or syncope.  Pt denies new neurological symptoms such as new headache, or facial or extremity weakness or numbness   Pt denies polydipsia, polyuria Past Medical History:  Diagnosis Date  . ALLERGIC RHINITIS 01/23/2007  . Arthritis    hands  . BENIGN PROSTATIC HYPERTROPHY 09/10/2006  . Chronic headaches   . Chronic sinusitis   . COPD, MILD 04/03/2010   patient denies on preop visit of 08/02/2014   . Difficulty in urination   . Dyspnea   . FATIGUE 01/08/2008  . GERD 04/15/2010  . H. pylori infection    hx of   . Headache(784.0) 09/10/2006  . HOARSENESS 04/15/2010  . HYPERLIPIDEMIA 09/10/2006  . INSOMNIA-SLEEP DISORDER-UNSPEC 04/24/2009  . Lung cancer ([place])   . PAF (paroxysmal atrial fibrillation) ([place])    identified on EKG 04/08/17  . PEPTIC ULCER DISEASE 01/23/2007  . Pneumonia    1955   Past Surgical History:  Procedure Laterality Date  . CATARACT EXTRACTION Left   . COLONOSCOPY    . CYSTOSCOPY N/A 10/07/2016   Procedure: CYSTOSCOPY;  Surgeon: Festus Aloe, MD;  Location: [place];  Service: Urology;  Laterality: N/A;  . FUDUCIAL PLACEMENT Right 02/06/2018   Procedure: PLACEMENT OF FIDUCIAL RIGHT LOWER LOBE LUNG;  Surgeon: [name], MD;  Location: [place];  Service: Thoracic;  Laterality: Right;  . HEMORRHOID BANDING    . INSERTION OF SUPRAPUBIC CATHETER N/A 10/07/2016   Procedure: FOLEY  CATHETER PLACEMENT;  Surgeon: Festus Aloe, MD;  Location: [facility];  Service: Urology;  Laterality: N/A;  . KNEE ARTHROSCOPY Left    torn meniscus  . LUMBAR LAMINECTOMY/DECOMPRESSION MICRODISCECTOMY Right 08/07/2014   Procedure: complete DECOMPRESSION LUMBAR LAMINECTOMY/MICRODISCECTOMY OF L4-L5 for spinal stenosis, DECOMPRESSION OF L3-L4;  Surgeon: Latanya Maudlin, MD;  Location: [facility];  Service: Orthopedics;  Laterality: Right;  . NASAL SINUS SURGERY Left 07/04/2017   Procedure: LEFT ENDOSCOPIC SINUS SURGERY;  Surgeon: Jerrell Belfast, MD;  Location: [facility];  Service: ENT;  Laterality: Left;  . RIGHT/LEFT HEART CATH AND CORONARY ANGIOGRAPHY N/A 06/21/2017   Procedure: RIGHT/LEFT HEART CATH AND CORONARY ANGIOGRAPHY;  Surgeon: Belva Crome, MD;  Location: [facility];  Service: Cardiovascular;  Laterality: N/A;  . ROTATOR CUFF REPAIR Right   . SHOULDER ARTHROSCOPY     x3, L & R  . SINUS ENDO WITH FUSION N/A 07/04/2017   Procedure: SINUS ENDO WITH FUSION;  Surgeon: Jerrell Belfast, MD;  Location: [facility];  Service: ENT;  Laterality: N/A;  .  VIDEO ASSISTED THORACOSCOPY (VATS)/ LOBECTOMY Right 10/07/2016   Procedure: VIDEO ASSISTED THORACOSCOPY (VATS)/RIGHT MIDDLE LOBECTOMY;  Surgeon: [name], MD;  Location: [facility];  Service: Thoracic;  Laterality: Right;  [name] VIDEO BRONCHOSCOPY WITH ENDOBRONCHIAL NAVIGATION N/A 09/23/2016   Procedure: VIDEO BRONCHOSCOPY WITH ENDOBRONCHIAL NAVIGATION;  Surgeon: [name], MD;  Location: [facility] [place];  Service: Thoracic;  Laterality: N/A;  . VIDEO BRONCHOSCOPY  WITH ENDOBRONCHIAL NAVIGATION N/A 02/06/2018   Procedure: VIDEO BRONCHOSCOPY WITH ENDOBRONCHIAL NAVIGATION;  Surgeon: [name], MD;  Location: [facility];  Service: Thoracic;  Laterality: N/A;  . VIDEO BRONCHOSCOPY WITH ENDOBRONCHIAL ULTRASOUND N/A 09/23/2016   Procedure: VIDEO BRONCHOSCOPY WITH ENDOBRONCHIAL ULTRASOUND;  Surgeon: [name], MD;  Location: [facility];  Service: Thoracic;  Laterality: N/A;    reports that he quit smoking about 42 years ago. He has a 23.00 pack-year smoking history. He has never used smokeless tobacco. He reports current alcohol use. He reports that he does not use drugs. family history includes Alzheimer's disease in his mother and sister; Prostate cancer in his brother. Allergies  Allergen Reactions  . Alfuzosin Other (See Comments) and Hypertension    BP went crazy, dizzy, passing out   . Aricept [Donepezil Hcl] Other (See Comments)    Insomnia, headache  . Penicillins Hives and Other (See Comments)    A PCN REACTION WITH IMMEDIATE RASH, FACIAL/TONGUE/THROAT SWELLING, SOB, OR LIGHTHEADEDNESS WITH HYPOTENSION:  #  #  #  YES  #  #  #   Has patient had a PCN reaction causing severe rash involving mucus membranes or skin necrosis:No Has patient had a PCN reaction that required hospitalization:No Has patient had a PCN reaction occurring within the last 10 years:No If all of the above answers are "NO", then may proceed with Cephalosporin use.    Current Outpatient Medications on File Prior to Visit  Medication Sig Dispense Refill  . albuterol (PROVENTIL HFA;VENTOLIN HFA) 108 (90 Base) MCG/ACT inhaler Inhale 2 puffs into the lungs every 6 (six) hours as needed for wheezing or shortness of breath. 1 Inhaler 11  . calcium carbonate (TUMS - DOSED IN MG ELEMENTAL CALCIUM) 500 MG chewable tablet Chew 1 tablet by mouth daily as needed for indigestion or heartburn.     [name] ELIQUIS 5 MG TABS tablet TAKE 1 TABLET BY MOUTH TWICE DAILY 180 tablet 1  . finasteride  (PROSCAR) 5 MG tablet Take 5 mg by mouth at bedtime.     . fluticasone (FLONASE) 50 MCG/ACT nasal spray Place 2 sprays into both nostrils daily as needed for allergies or rhinitis.   11  . furosemide (LASIX) 20 MG tablet TAKE 1 TABLET(20 MG) BY MOUTH DAILY 90 tablet 0  . propafenone (RYTHMOL) 225 MG tablet Take 1 tablet (225 mg total) by mouth 2 (two) times daily. Please make yearly appt with Dr. Lovena Le for September for future refills. 1st attempt 180 tablet 0  . rivastigmine (EXELON) 1.5 MG capsule TAKE 1 CAPSULE(1.5 MG) BY MOUTH TWICE DAILY 60 capsule 3  . silodosin (RAPAFLO) 8 MG CAPS capsule Take 8 mg by mouth at bedtime.     [name] zolpidem (AMBIEN) 5 MG tablet Take 1 tablet (5 mg total) by mouth at bedtime as needed. for sleep 30 tablet 5   No current facility-administered medications on file prior to visit.     Observations/Objective: Alert, NAD, appropriate mood and affect, resps normal, cn 2-12 intact, moves all 4s, no visible rash or swelling Lab Results  Component Value  Date   WBC 4.3 04/07/2018   HGB 12.4 (L) 04/07/2018   HCT 36.3 (L) 04/07/2018   PLT 198 04/07/2018   GLUCOSE 95 05/16/2018   CHOL 169 03/18/2017   TRIG 122.0 03/18/2017   HDL 35.90 (L) 03/18/2017   LDLDIRECT 134.4 03/27/2010   LDLCALC 108 (H) 03/18/2017   ALT <6 04/07/2018   AST 12 (L) 04/07/2018   NA 141 05/16/2018   K 3.9 05/16/2018   CL 105 05/16/2018   CREATININE 1.18 05/16/2018   BUN 13 05/16/2018   CO2 24 05/16/2018   TSH 0.92 11/09/2017   PSA 1.91 03/18/2017   INR 1.10 02/06/2018   HGBA1C 4.9 04/12/2018   Assessment and Plan: See notes  Follow Up Instructions: See notes   I discussed the assessment and treatment plan with the patient. The patient was provided an opportunity to ask questions and all were answered. The patient agreed with the plan and demonstrated an understanding of the instructions.   The patient was advised to call back or seek an in-person evaluation if the symptoms worsen  or if the condition fails to improve as anticipated.   Cathlean Cower, MD

## 2018-10-19 NOTE — Telephone Encounter (Signed)
Patient woke up this morning- dizziness and vomiting. Patient has one other episode last week which resolved. Daughter is calling to report episode- patient seems to be recovering and is feeling some better after reclining for a bit this morning. Patient did have to hold on to furniture this morning. They do not want to bring him out due to COVID- call to office to see if a virtual visit would be possible/appropriate.  Reason for Disposition . [1] MODERATE dizziness (e.g., interferes with normal activities) AND [2] has NOT been evaluated by physician for this  (Exception: dizziness caused by heat exposure, sudden standing, or poor fluid intake)  Answer Assessment - Initial Assessment Questions 1. DESCRIPTION: "Describe your dizziness."     Head is spinning around 2. LIGHTHEADED: "Do you feel lightheaded?" (e.g., somewhat faint, woozy, weak upon standing)     Felt faint and woozy 3. VERTIGO: "Do you feel like either you or the room is spinning or tilting?" (i.e. vertigo)     spinning 4. SEVERITY: "How bad is it?"  "Do you feel like you are going to faint?" "Can you stand and walk?"   - MILD - walking normally   - MODERATE - interferes with normal activities (e.g., work, school)    - SEVERE - unable to stand, requires support to walk, feels like passing out now.      Moderate/severe 5. ONSET:  "When did the dizziness begin?"     This morning 6. AGGRAVATING FACTORS: "Does anything make it worse?" (e.g., standing, change in head position)     Patient is feeling slight dizziness when laying down 7. HEART RATE: "Can you tell me your heart rate?" "How many beats in 15 seconds?"  (Note: not all patients can do this)       BP 125/57  P 70  8. CAUSE: "What do you think is causing the dizziness?"     yes 9. RECURRENT SYMPTOM: "Have you had dizziness before?" If so, ask: "When was the last time?" "What happened that time?"     Once before- Tuesday- resolved 10. OTHER SYMPTOMS: "Do you have any other  symptoms?" (e.g., fever, chest pain, vomiting, diarrhea, bleeding)       vomiting 11. PREGNANCY: "Is there any chance you are pregnant?" "When was your last menstrual period?"       n/a  Protocols used: [facility]

## 2018-10-19 NOTE — Telephone Encounter (Signed)
Unfortunately I do not know how this is done, as I have never done this

## 2018-10-23 NOTE — Telephone Encounter (Signed)
We are not offering this service at home. The drive thru's do not require you to get out of the car, so that is probably his best option. Not sure what she is referring to about the website, but I think she is mistaken.

## 2018-10-23 NOTE — Progress Notes (Signed)
Virtual Visit via Video Note  I connected with Raymond Logan on 10/23/18 at  1:15 PM EDT by a video enabled telemedicine application and verified that I am speaking with the correct person using two identifiers.  Location: Patient: At his home Provider: In the office    I discussed the limitations of evaluation and management by telemedicine and the availability of in person appointments. The patient expressed understanding and agreed to proceed.  History of Present Illness: 10/24/2018 SS: Raymond Logan is an 85 year old male with history of progressive memory disturbance.  He lives alone, his wife passed away in 03/16/18.  He does have 2 daughters who live nearby in [place].  He is still able to operate a car.  He has been unable to tolerate Namenda as result of an episode of mild delirium.  He has been unable to tolerate Aricept because of headache and insomnia.  His last memory score was 27/30. At last visit he was started on low-dose Exelon.  His daughter, Raymond Logan is now staying with him.  He feels his memory has improved since last visit.  He has been taking several over-the-counter supplements (mentioned: lion's mane, [place] tail, mushrooms).  He manages his finances, with assistance from his other daughter who is also the POA.  He thinks he would be able to manage his finances independently.  He does still drive a car without difficulty.  He has not had any falls.  He walks 3000 steps a day.  He reports that his appetite is fair.  His daughter reports his pills in a pill organizer.  Since last visit he has lost about 10 pounds.  He is able to perform all of his own ADLs.  He presents today for follow-up via virtual visit.  04/25/2018 Dr. Jannifer Franklin: [name] is an 85 year old right-handed white male with a history of a progressive memory disturbance.  The patient indicates that his wife passed away on 03-09-2018.  He is now living alone.  He does have 2 daughters who live near  [place], they will come by to check on him on occasion.  The patient is still able to operate a motor vehicle, he uses GPS regularly to help navigate the city.  The patient has not had any safety issues associated with this.  He has a pill dispenser that holds 30 days of medication and it will beep when medication dosing is due.  The patient is able to cook for himself, he has gained 4 pounds of weight since August 2019.  He is sleeping fairly well at night.  Approximately 2 months after he started Namenda he had an episode of what sounds like a mild delirium state, he was taken off of Namenda at that time.  In the past, he has not tolerated Aricept because of headache and insomnia.  The patient believes that his memory is gradually worsening.  He is managing keeping up with his appointments using a calendar, he is able to function relatively independently at this time.  He returns for an evaluation   Observations/Objective: Via virtual visit, is alert and oriented, well-appearing, answers questions appropriately, speech is clear and concise, facial symmetry noted, hands, no arm drift, gait is intact, slight hunched forward, good pace with good turns  MOCA-Blind was 17/22, 15 words that start with letter "F"  Assessment and Plan: 1.  Memory disturbance  His memory score appears stable since last visit.  Since last visit, he has lost 10 pounds.  He is tolerating the Exelon 1.5 mg twice a day.  Due to the reported weight loss, I will not go up on the medication at this time.  His daughter may be interested in increasing the medication in the future if his weight is stable.  He continues to take several over-the-counter supplements, I have advised him to use caution as many of these supplements may have the potential for interaction with prescribed medication.  I have encouraged him to continue his activity, walking 3000 steps a day.  He will follow-up in 6 months or sooner if needed.  I did advise that if  his symptoms worsen or if he develops any new symptoms.  Follow Up Instructions: 6 months 05/02/2019 1:45   I discussed the assessment and treatment plan with the patient. The patient was provided an opportunity to ask questions and all were answered. The patient agreed with the plan and demonstrated an understanding of the instructions.   The patient was advised to call back or seek an in-person evaluation if the symptoms worsen or if the condition fails to improve as anticipated.  I provided 15 minutes of non-face-to-face time during this encounter.  Evangeline Dakin, DNP  [facility] [address] [place], [place] [address] [phone]

## 2018-10-23 NOTE — Telephone Encounter (Signed)
Pt's daughter has been informed and expressed understanding. She will take pt to the [facility] site for testing.

## 2018-10-24 ENCOUNTER — Encounter: Payer: Self-pay | Admitting: Neurology

## 2018-10-24 ENCOUNTER — Telehealth (INDEPENDENT_AMBULATORY_CARE_PROVIDER_SITE_OTHER): Payer: Medicare Other | Admitting: Neurology

## 2018-10-24 DIAGNOSIS — G3184 Mild cognitive impairment, so stated: Secondary | ICD-10-CM

## 2018-10-24 MED ORDER — RIVASTIGMINE TARTRATE 1.5 MG PO CAPS: ORAL_CAPSULE | ORAL | 6 refills | Status: DC

## 2018-10-24 NOTE — Progress Notes (Signed)
I have read the note, and I agree with the clinical assessment and plan.  Libni Fusaro K Allora Bains   

## 2018-10-30 ENCOUNTER — Other Ambulatory Visit: Payer: Self-pay | Admitting: Internal Medicine

## 2018-11-10 ENCOUNTER — Inpatient Hospital Stay: Payer: Medicare Other

## 2018-11-10 ENCOUNTER — Telehealth: Payer: Self-pay | Admitting: Medical Oncology

## 2018-11-10 ENCOUNTER — Other Ambulatory Visit: Payer: Self-pay

## 2018-11-10 ENCOUNTER — Other Ambulatory Visit: Payer: Self-pay | Admitting: Medical

## 2018-11-10 ENCOUNTER — Ambulatory Visit (HOSPITAL_BASED_OUTPATIENT_CLINIC_OR_DEPARTMENT_OTHER): Payer: Medicare Other | Admitting: Medical

## 2018-11-10 ENCOUNTER — Telehealth: Payer: Self-pay | Admitting: Internal Medicine

## 2018-11-10 DIAGNOSIS — R05 Cough: Secondary | ICD-10-CM

## 2018-11-10 DIAGNOSIS — R059 Cough, unspecified: Secondary | ICD-10-CM

## 2018-11-10 DIAGNOSIS — C342 Malignant neoplasm of middle lobe, bronchus or lung: Secondary | ICD-10-CM

## 2018-11-10 NOTE — Progress Notes (Signed)
The patient is an 85 year old male with a history of a stage Ia non-small cell lung cancer, adenocarcinoma who is followed conservatively by  Dr. Fanny Bien. Raymond Logan.  He was last seen on 07/11/2013.  He presented to the office today for labs prior to his visit with Dr. Julien Nordmann 11/13/2018.  As part of his intake screening he reported that he had developed a nonproductive cough over the past 2 to 3 days.  He denied any other symptoms including fevers, chills, sweats, diarrhea, nausea, vomiting, loss of the sense of smell, and loss of the sense of taste.  It was requested that the patient's labs be rescheduled until 11/13/2018.  He was referred to [facility] for COVID testing.  Sandi Mealy, MHS, PA-C Physician Assistant

## 2018-11-10 NOTE — Telephone Encounter (Signed)
I left a message about reschedule of lab to 9/14

## 2018-11-10 NOTE — Telephone Encounter (Signed)
He has not had  CT yet so I cancelled his f/u appt for next week. Pt notified. I did tell him to keep lab appt on 9/14 .

## 2018-11-13 ENCOUNTER — Inpatient Hospital Stay: Payer: Medicare Other | Admitting: Internal Medicine

## 2018-11-13 ENCOUNTER — Other Ambulatory Visit: Payer: Self-pay | Admitting: *Deleted

## 2018-11-13 ENCOUNTER — Inpatient Hospital Stay: Payer: Medicare Other | Attending: Internal Medicine

## 2018-11-13 ENCOUNTER — Other Ambulatory Visit: Payer: Self-pay

## 2018-11-13 DIAGNOSIS — Z20822 Contact with and (suspected) exposure to covid-19: Secondary | ICD-10-CM

## 2018-11-13 DIAGNOSIS — C342 Malignant neoplasm of middle lobe, bronchus or lung: Secondary | ICD-10-CM | POA: Diagnosis not present

## 2018-11-13 LAB — CBC WITH DIFFERENTIAL (CANCER CENTER ONLY)
Abs Immature Granulocytes: 0.01 10*3/uL (ref 0.00–0.07)
Basophils Absolute: 0 10*3/uL (ref 0.0–0.1)
Basophils Relative: 0 %
Eosinophils Absolute: 0 10*3/uL (ref 0.0–0.5)
Eosinophils Relative: 1 %
HCT: 31.5 % — ABNORMAL LOW (ref 39.0–52.0)
Hemoglobin: 10.5 g/dL — ABNORMAL LOW (ref 13.0–17.0)
Immature Granulocytes: 0 %
Lymphocytes Relative: 13 %
Lymphs Abs: 0.6 10*3/uL — ABNORMAL LOW (ref 0.7–4.0)
MCH: 29.9 pg (ref 26.0–34.0)
MCHC: 33.3 g/dL (ref 30.0–36.0)
MCV: 89.7 fL (ref 80.0–100.0)
Monocytes Absolute: 0.6 10*3/uL (ref 0.1–1.0)
Monocytes Relative: 11 %
Neutro Abs: 3.8 10*3/uL (ref 1.7–7.7)
Neutrophils Relative %: 75 %
Platelet Count: 242 10*3/uL (ref 150–400)
RBC: 3.51 MIL/uL — ABNORMAL LOW (ref 4.22–5.81)
RDW: 11.9 % (ref 11.5–15.5)
WBC Count: 5.1 10*3/uL (ref 4.0–10.5)
nRBC: 0 % (ref 0.0–0.2)

## 2018-11-13 LAB — CMP (CANCER CENTER ONLY)
ALT: <6 U/L (ref 0–44)
AST: 9 U/L — ABNORMAL LOW (ref 15–41)
Albumin: 3.1 g/dL — ABNORMAL LOW (ref 3.5–5.0)
Alkaline Phosphatase: 89 U/L (ref 38–126)
Anion gap: 5 (ref 5–15)
BUN: 18 mg/dL (ref 8–23)
CO2: 31 mmol/L (ref 22–32)
Calcium: 8.7 mg/dL — ABNORMAL LOW (ref 8.9–10.3)
Chloride: 105 mmol/L (ref 98–111)
Creatinine: 1.13 mg/dL (ref 0.61–1.24)
GFR, Est AFR Am: >60 mL/min (ref >60)
GFR, Estimated: 59 mL/min — ABNORMAL LOW (ref >60)
Glucose, Bld: 128 mg/dL — ABNORMAL HIGH (ref 70–99)
Potassium: 4.1 mmol/L (ref 3.5–5.1)
Sodium: 141 mmol/L (ref 135–145)
Total Bilirubin: 0.2 mg/dL — ABNORMAL LOW (ref 0.3–1.2)
Total Protein: 6.7 g/dL (ref 6.5–8.1)

## 2018-11-14 LAB — NOVEL CORONAVIRUS, NAA: SARS-CoV-2, NAA: NOT DETECTED

## 2018-11-15 ENCOUNTER — Other Ambulatory Visit: Payer: Medicare Other | Admitting: Licensed Clinical Social Worker

## 2018-11-15 ENCOUNTER — Other Ambulatory Visit: Payer: Self-pay

## 2018-11-15 DIAGNOSIS — Z515 Encounter for palliative care: Secondary | ICD-10-CM

## 2018-11-16 NOTE — Progress Notes (Signed)
COMMUNITY PALLIATIVE CARE SW NOTE  PATIENT NAME: Raymond Logan DOB: 11/08/1930 MRN: [id number]  PRIMARY CARE PROVIDER: Biagio Borg, MD  RESPONSIBLE PARTY:  Acct ID - Guarantor Home Phone Work Phone Relationship Acct Type  [id number] [name]  Self P/F     [address], [place], [place] [address]   Due to the COVID-19 crisis, this virtual check-in visit was done via telephone from my office and it was initiated and consent given by thispatientand or family.  PLAN OF CARE and INTERVENTIONS:             1. GOALS OF CARE/ ADVANCE CARE PLANNING:  Patient's goal is to remain in his home.  He remains a full code. 2. SOCIAL/EMOTIONAL/SPIRITUAL ASSESSMENT/ INTERVENTIONS:  SW conducted a virtual check-in visit with patient in his home.  He reports his energy level has decreased significantly.  His daughter, [name], who was living with him is currently in the hospital due to a bowel obstruction.  Patient complained of being disorganized.  A visit is scheduled next week with SW and RN, Daryl Eastern, at his house.  SW provided active listening and supportive counseling. 3. PATIENT/CAREGIVER EDUCATION/ COPING:  Patient's copes by expressing himself openly. 4. PERSONAL EMERGENCY PLAN:  He rest when he become fatigued.  He will contact his daughter, [name], for emergencies. 5. COMMUNITY RESOURCES COORDINATION/ HEALTH CARE NAVIGATION:  None. 6. FINANCIAL/LEGAL CONCERNS/INTERVENTIONS:  None.     SOCIAL HX:  Social History   Tobacco Use  . Smoking status: Former Smoker    Packs/day: 1.00    Years: 23.00    Pack years: 23.00    Quit date: 1978    Years since quitting: 42.7  . Smokeless tobacco: Never Used  Substance Use Topics  . Alcohol use: Yes    Comment: rare    CODE STATUS:  Full Code ADVANCED DIRECTIVES: N MOST FORM COMPLETE:  N HOSPICE EDUCATION PROVIDED: Y PPS:  Patient reports his appetite has decreased.  He is able to ambulate independently. Duration of visit  and documentation:  45 minutes.      Creola Corn Katrin Grabel, LCSW

## 2018-11-17 ENCOUNTER — Encounter: Payer: Self-pay | Admitting: Radiation Oncology

## 2018-11-17 ENCOUNTER — Encounter (HOSPITAL_COMMUNITY): Payer: Self-pay

## 2018-11-17 ENCOUNTER — Ambulatory Visit (HOSPITAL_COMMUNITY)
Admission: RE | Admit: 2018-11-17 | Discharge: 2018-11-17 | Disposition: A | Discharge status: Home / Self Care | Payer: Medicare Other | Source: Ambulatory Visit | Attending: Radiation Oncology | Admitting: Radiation Oncology

## 2018-11-17 ENCOUNTER — Other Ambulatory Visit: Payer: Self-pay

## 2018-11-17 DIAGNOSIS — C342 Malignant neoplasm of middle lobe, bronchus or lung: Secondary | ICD-10-CM | POA: Diagnosis not present

## 2018-11-17 DIAGNOSIS — J90 Pleural effusion, not elsewhere classified: Secondary | ICD-10-CM | POA: Diagnosis not present

## 2018-11-17 DIAGNOSIS — J8410 Pulmonary fibrosis, unspecified: Secondary | ICD-10-CM | POA: Diagnosis not present

## 2018-11-17 MED ORDER — IOHEXOL 300 MG/ML  SOLN
75.0000 mL | Freq: Once | INTRAMUSCULAR | Status: AC | PRN
  Administered 2018-11-17: 75 mL via INTRAVENOUS

## 2018-11-20 ENCOUNTER — Encounter: Payer: Self-pay | Admitting: Radiation Oncology

## 2018-11-20 ENCOUNTER — Ambulatory Visit
Admission: RE | Admit: 2018-11-20 | Discharge: 2018-11-20 | Disposition: A | Discharge status: Home / Self Care | Payer: Medicare Other | Source: Ambulatory Visit | Attending: Radiation Oncology | Admitting: Radiation Oncology

## 2018-11-20 ENCOUNTER — Other Ambulatory Visit: Payer: Self-pay

## 2018-11-20 VITALS — BP 102/69 | HR 93 | Resp 18 | Wt 144.1 lb

## 2018-11-20 DIAGNOSIS — Z7901 Long term (current) use of anticoagulants: Secondary | ICD-10-CM | POA: Insufficient documentation

## 2018-11-20 DIAGNOSIS — R0602 Shortness of breath: Secondary | ICD-10-CM | POA: Diagnosis not present

## 2018-11-20 DIAGNOSIS — N400 Enlarged prostate without lower urinary tract symptoms: Secondary | ICD-10-CM | POA: Insufficient documentation

## 2018-11-20 DIAGNOSIS — I700 Atherosclerosis of aorta: Secondary | ICD-10-CM | POA: Diagnosis not present

## 2018-11-20 DIAGNOSIS — M129 Arthropathy, unspecified: Secondary | ICD-10-CM | POA: Insufficient documentation

## 2018-11-20 DIAGNOSIS — R5383 Other fatigue: Secondary | ICD-10-CM | POA: Diagnosis not present

## 2018-11-20 DIAGNOSIS — Z85118 Personal history of other malignant neoplasm of bronchus and lung: Secondary | ICD-10-CM | POA: Diagnosis not present

## 2018-11-20 DIAGNOSIS — Z87891 Personal history of nicotine dependence: Secondary | ICD-10-CM | POA: Diagnosis not present

## 2018-11-20 DIAGNOSIS — Z79899 Other long term (current) drug therapy: Secondary | ICD-10-CM | POA: Diagnosis not present

## 2018-11-20 DIAGNOSIS — E785 Hyperlipidemia, unspecified: Secondary | ICD-10-CM | POA: Diagnosis not present

## 2018-11-20 DIAGNOSIS — G4700 Insomnia, unspecified: Secondary | ICD-10-CM | POA: Insufficient documentation

## 2018-11-20 DIAGNOSIS — I480 Paroxysmal atrial fibrillation: Secondary | ICD-10-CM | POA: Insufficient documentation

## 2018-11-20 DIAGNOSIS — C3431 Malignant neoplasm of lower lobe, right bronchus or lung: Secondary | ICD-10-CM | POA: Insufficient documentation

## 2018-11-20 DIAGNOSIS — R911 Solitary pulmonary nodule: Secondary | ICD-10-CM

## 2018-11-20 DIAGNOSIS — J449 Chronic obstructive pulmonary disease, unspecified: Secondary | ICD-10-CM | POA: Insufficient documentation

## 2018-11-20 DIAGNOSIS — K219 Gastro-esophageal reflux disease without esophagitis: Secondary | ICD-10-CM | POA: Insufficient documentation

## 2018-11-20 DIAGNOSIS — Z923 Personal history of irradiation: Secondary | ICD-10-CM | POA: Diagnosis not present

## 2018-11-20 DIAGNOSIS — Z8711 Personal history of peptic ulcer disease: Secondary | ICD-10-CM | POA: Diagnosis not present

## 2018-11-20 DIAGNOSIS — C342 Malignant neoplasm of middle lobe, bronchus or lung: Secondary | ICD-10-CM | POA: Diagnosis not present

## 2018-11-20 NOTE — Addendum Note (Signed)
Encounter addended by: Hayden Pedro, PA-C on: 11/20/2018 2:05 PM  Actions taken: Follow-up modified

## 2018-11-20 NOTE — Progress Notes (Signed)
Radiation Oncology         [phone] ________________________________  Name: Raymond Logan MRN: [id number]  Date of Service: 11/20/2018 DOB: Nov 14, 1930  Follow Up Note  CC: Biagio Borg, MD  [name], *  Diagnosis:   Stage IB, pT1cN0, NSCLC, adenocarcinoma of the right middle lobe of the lung with new finding of RLL lesion.  Interval Since Last Radiation:10 months  02/24/18-03/09/2018 SBRT Treatment: The RLL target was treated to 50 Gy in 5 fractions  11/18/2016 - 12/30/2016: The patient was treated to the right middle lobe surgical bed to a dose of 60 Gy in 30 fractions using a 3 field, 3-D conformal technique.   Narrative:  Raymond Logan is a pleasant 85 y.o. gentleman who was treated with radiotherapy for a positive margin following surgical resection of a stage I lung cancer in 2018. He tolerated radiation with the exception of "mental fogginess." After reviewing his case with his daughter who's a physical therapist, the family had noticed increasing mental confusion and decline. He did undergo a brain MRI to rule out metastatic disease which was negative for metastases on  12/22/16. He is followed by Dr. Jannifer Franklin in Neurology. He has been followed in surveillance since completing radiotherapy. A CT scan in February 2019 revealed a new 6 mm nodule in the right lower lobe, and a 5.7 mm nodule in the posterior superior aspect of the right major fissure.  There is an enlarged AP window lymph node and a 15 mm subcarinal lymph node both new since the prior study, he underwent repeat CT scan on 08/30/2017 which revealed interval growth of a sub-solid 1.7 cm right lower lobe nodule and perihilar radiation fibrosis.  A small dependent right pleural effusion was again noted, and no adenopathy was noted.  PET/CT on 10/04/2017 revealed an SUV max of 1.6 in the right lower lobe lesion, post surgical and post radiotherapy changes within the right aspect of the lung is fissure, there is a 4 mm  nodule in the left lung at the oblique fissure, and increased hypermetabolic activity in the small left hilar lymph node with an SUV of 7.5, previously in 2018 it was 4.9.  CT imaging on 12/09/2017 and slight interval enlargement of the right lower lobe nodule was identified measuring 23.5 x 13.5 mm, and he subsequently received SBRT to the RLL lesion. He has done well since completing this course and his most recent CT on 11/17/2018 revealed stable post radiation changes in the RLL and post surgical changes in the RML region. No new nodules or adenopathy was present.                    On review of systems, the patient reports that he is doing well overall. While he cannot play ping pong at the senior center due to [place] pandemic, he is active and reports he walks at least 3000 steps per day. He is able to drive and continues to enjoy spending time outdoors and with his family. He denies any chest pain, shortness of breath, cough, fevers, chills, night sweats, unintended weight changes. He denies any bowel or bladder disturbances, and denies abdominal pain, nausea or vomiting. He denies any new musculoskeletal or joint aches or pains, new skin lesions or concerns. A complete review of systems is obtained and is otherwise negative.    Past Medical History:  Past Medical History:  Diagnosis Date   ALLERGIC RHINITIS 01/23/2007   Arthritis    hands  BENIGN PROSTATIC HYPERTROPHY 09/10/2006   Chronic headaches    Chronic sinusitis    COPD, MILD 04/03/2010   patient denies on preop visit of 08/02/2014    Difficulty in urination    Dyspnea    FATIGUE 01/08/2008   GERD 04/15/2010   H. pylori infection    hx of    Headache(784.0) 09/10/2006   HOARSENESS 04/15/2010   HYPERLIPIDEMIA 09/10/2006   INSOMNIA-SLEEP DISORDER-UNSPEC 04/24/2009   Lung cancer ([place])    PAF (paroxysmal atrial fibrillation) ([place])    identified on EKG 04/08/17   PEPTIC ULCER DISEASE 01/23/2007   Pneumonia    1955     Past Surgical History: Past Surgical History:  Procedure Laterality Date   CATARACT EXTRACTION Left    COLONOSCOPY     CYSTOSCOPY N/A 10/07/2016   Procedure: CYSTOSCOPY;  Surgeon: Festus Aloe, MD;  Location: [facility];  Service: Urology;  Laterality: N/A;   FUDUCIAL PLACEMENT Right 02/06/2018   Procedure: PLACEMENT OF FIDUCIAL RIGHT LOWER LOBE LUNG;  Surgeon: [name], MD;  Location: [facility];  Service: Thoracic;  Laterality: Right;   HEMORRHOID BANDING     INSERTION OF SUPRAPUBIC CATHETER N/A 10/07/2016   Procedure: FOLEY  CATHETER PLACEMENT;  Surgeon: Festus Aloe, MD;  Location: [facility];  Service: Urology;  Laterality: N/A;   KNEE ARTHROSCOPY Left    torn meniscus   LUMBAR LAMINECTOMY/DECOMPRESSION MICRODISCECTOMY Right 08/07/2014   Procedure: complete DECOMPRESSION LUMBAR LAMINECTOMY/MICRODISCECTOMY OF L4-L5 for spinal stenosis, DECOMPRESSION OF L3-L4;  Surgeon: Latanya Maudlin, MD;  Location: [facility];  Service: Orthopedics;  Laterality: Right;   NASAL SINUS SURGERY Left 07/04/2017   Procedure: LEFT ENDOSCOPIC SINUS SURGERY;  Surgeon: Jerrell Belfast, MD;  Location: [facility];  Service: ENT;  Laterality: Left;   RIGHT/LEFT HEART CATH AND CORONARY ANGIOGRAPHY N/A 06/21/2017   Procedure: RIGHT/LEFT HEART CATH AND CORONARY ANGIOGRAPHY;  Surgeon: Belva Crome, MD;  Location: [facility];  Service: Cardiovascular;  Laterality: N/A;   ROTATOR CUFF REPAIR Right    SHOULDER ARTHROSCOPY     x3, L & R   SINUS ENDO WITH FUSION N/A 07/04/2017   Procedure: SINUS ENDO WITH FUSION;  Surgeon: Jerrell Belfast, MD;  Location: [facility];  Service: ENT;  Laterality: N/A;   VIDEO ASSISTED THORACOSCOPY (VATS)/ LOBECTOMY Right 10/07/2016   Procedure: VIDEO ASSISTED THORACOSCOPY (VATS)/RIGHT MIDDLE LOBECTOMY;  Surgeon: [name], MD;  Location: [facility];  Service: Thoracic;  Laterality: Right;   VIDEO BRONCHOSCOPY WITH ENDOBRONCHIAL NAVIGATION N/A 09/23/2016   Procedure: VIDEO  BRONCHOSCOPY WITH ENDOBRONCHIAL NAVIGATION;  Surgeon: [name], MD;  Location: [facility];  Service: Thoracic;  Laterality: N/A;   VIDEO BRONCHOSCOPY WITH ENDOBRONCHIAL NAVIGATION N/A 02/06/2018   Procedure: VIDEO BRONCHOSCOPY WITH ENDOBRONCHIAL NAVIGATION;  Surgeon: [name], MD;  Location: [facility];  Service: Thoracic;  Laterality: N/A;   VIDEO BRONCHOSCOPY WITH ENDOBRONCHIAL ULTRASOUND N/A 09/23/2016   Procedure: VIDEO BRONCHOSCOPY WITH ENDOBRONCHIAL ULTRASOUND;  Surgeon: [name], MD;  Location: [facility];  Service: Thoracic;  Laterality: N/A;    Social History:  Social History   Socioeconomic History   Marital status: Widowed    Spouse name: Not on file   Number of children: 3   Years of education: Not on file   Highest education level: Not on file  Occupational History   Occupation: Retired    Fish farm manager: RETIRED  Scientist, product/process development strain: Not on file   Food insecurity    Worry: Not  on file    Inability: Not on file   Transportation needs    Medical: No    Non-medical: No  Tobacco Use   Smoking status: Former Smoker    Packs/day: 1.00    Years: 23.00    Pack years: 23.00    Quit date: 28    Years since quitting: 42.7   Smokeless tobacco: Never Used  Substance and Sexual Activity   Alcohol use: Yes    Comment: rare   Drug use: No   Sexual activity: Not on file  Lifestyle   Physical activity    Days per week: Not on file    Minutes per session: Not on file   Stress: Not on file  Relationships   Social connections    Talks on phone: Not on file    Gets together: Not on file    Attends religious service: Not on file    Active member of club or organization: Not on file    Attends meetings of clubs or organizations: Not on file    Relationship status: Not on file   Intimate partner violence    Fear of current or ex partner: Not on file    Emotionally abused: Not on file    Physically abused: Not on  file    Forced sexual activity: Not on file  Other Topics Concern   Not on file  Social History Narrative   Not on file  The patient is widowed. His wife passed away suddenly in June 04, 2018. He is retired from working for [employer], and plays [place] at the senior center weekly.   Family History: Family History  Problem Relation Age of Onset   Prostate cancer Brother    Alzheimer's disease Mother    Alzheimer's disease Sister      ALLERGIES:  is allergic to alfuzosin; aricept [donepezil hcl]; and penicillins.  Meds: Current Outpatient Medications  Medication Sig Dispense Refill   albuterol (PROVENTIL HFA;VENTOLIN HFA) 108 (90 Base) MCG/ACT inhaler Inhale 2 puffs into the lungs every 6 (six) hours as needed for wheezing or shortness of breath. 1 Inhaler 11   calcium carbonate (TUMS - DOSED IN MG ELEMENTAL CALCIUM) 500 MG chewable tablet Chew 1 tablet by mouth daily as needed for indigestion or heartburn.      diazepam (VALIUM) 5 MG tablet 1/2 - 1 tab by mouth daily as needed for vertigo 10 tablet 0   ELIQUIS 5 MG TABS tablet TAKE 1 TABLET BY MOUTH TWICE DAILY 180 tablet 1   finasteride (PROSCAR) 5 MG tablet Take 5 mg by mouth at bedtime.      fluticasone (FLONASE) 50 MCG/ACT nasal spray Place 2 sprays into both nostrils daily as needed for allergies or rhinitis.   11   furosemide (LASIX) 20 MG tablet TAKE 1 TABLET(20 MG) BY MOUTH DAILY 90 tablet 0   meclizine (ANTIVERT) 12.5 MG tablet 1-2 tablets by mouth every 8 hrs as needed for vertigo 40 tablet 1   propafenone (RYTHMOL) 225 MG tablet Take 1 tablet (225 mg total) by mouth 2 (two) times daily. Please keep upcoming appt in October with Dr. Lovena Le for future refills. Thank you 180 tablet 0   rivastigmine (EXELON) 1.5 MG capsule TAKE 1 CAPSULE(1.5 MG) BY MOUTH TWICE DAILY 60 capsule 6   silodosin (RAPAFLO) 8 MG CAPS capsule Take 8 mg by mouth at bedtime.      zolpidem (AMBIEN) 5 MG tablet Take 1 tablet (5 mg total) by  mouth  at bedtime as needed. for sleep 30 tablet 5   ondansetron (ZOFRAN) 4 MG tablet Take 1 tablet (4 mg total) by mouth every 8 (eight) hours as needed for nausea or vomiting. (Patient not taking: Reported on 11/20/2018) 20 tablet 1   No current facility-administered medications for this encounter.     Physical Findings:  weight is 144 lb 2 oz (65.4 kg). His blood pressure is 102/69 and his pulse is 93. His respiration is 18 and oxygen saturation is 95%.  Pain Assessment Pain Score: 0-No pain/10 In general this is a well appearing caucasian male in no acute distress. He's alert and oriented x4 and appropriate throughout the examination. Cardiopulmonary assessment is negative for acute distress and he exhibits normal effort.    Lab Findings: Lab Results  Component Value Date   WBC 5.1 11/13/2018   HGB 10.5 (L) 11/13/2018   HCT 31.5 (L) 11/13/2018   MCV 89.7 11/13/2018   PLT 242 11/13/2018     Radiographic Findings: Ct Chest W Contrast  Result Date: 11/17/2018 CLINICAL DATA:  85 year old male with history of right-sided lung cancer diagnosed in 2018, status post radiation therapy which is now complete (curative radiotherapy to the right lower lobe completed March 07, 2018). Intermittent shortness of breath. EXAM: CT CHEST WITH CONTRAST TECHNIQUE: Multidetector CT imaging of the chest was performed during intravenous contrast administration. CONTRAST:  62mL OMNIPAQUE IOHEXOL 300 MG/ML  SOLN COMPARISON:  Chest CT 12/09/2017. FINDINGS: Cardiovascular: Heart size is normal. There is no significant pericardial fluid, thickening or pericardial calcification. Aortic atherosclerosis. No definite coronary artery calcifications. Thickening and calcification of the aortic valve. Mediastinum/Nodes: No pathologically enlarged mediastinal or hilar in appearance lymph nodes. Esophagus is unremarkable. No axillary lymphadenopathy. Lungs/Pleura: When compared to the prior study from 05/16/2018 there is now  a mass-like area of architectural distortion surrounding the fiducial markers in this region, most compatible with evolving postradiation mass-like fibrosis. Small right pleural effusion, predominantly lying dependently and in the sub pulmonic region of the right hemithorax. No new suspicious appearing pulmonary nodules or masses are noted. Chronic architectural distortion in the right upper lobe similar to the prior study, also compatible with chronic postradiation changes. No acute consolidative airspace disease. No left pleural effusions. Upper Abdomen: Incompletely imaged low-attenuation lesion in the interpolar region of the right kidney measuring at least 3.1 cm in diameter, likely to represent a cyst. Multifocal cortical thinning in the left kidney. Aortic atherosclerosis. Musculoskeletal: There are no aggressive appearing lytic or blastic lesions noted in the visualized portions of the skeleton. IMPRESSION: 1. Evolving postradiation changes of masslike fibrosis in the right lower lobe, as discussed above. No findings to suggest new metastatic disease in the thorax on today's examination. 2. Chronic small right pleural effusion, similar to the prior study. 3. Aortic atherosclerosis. 4. There are calcifications of the aortic valve. Echocardiographic correlation for evaluation of potential valvular dysfunction may be warranted if clinically indicated. Aortic Atherosclerosis (ICD10-I70.0). Electronically Signed   By: Vinnie Langton M.D.   On: 11/17/2018 13:49    Impression/Plan: 1. Stage IA3, cT2N0M0, NSCLC, adenocarcinoma of the RLL. The patient appears to be doing very well clinically as well as radiographically. He will follow up in 6 months time for continued evaluation for repeat CT of the chest. I encouraged him to continue his activity as he can tolerate this, and hopefully he will be able to get back to playing ping pong soon. He will call for further questions or concerns moving forward. He has  seen Dr. Julien Nordmann in the past, but does not have any plans for follow up that I can tell so we will see him back, but if that were to change, we would defer further discussion to medical oncology.  2. Stage IB, pT1cN0M0, NSCLC, adenocarcinoma of the right middle lobe of the lung. The patientt continues to be followed in surveillance regarding this site.  This encounter was provided by telemedicine platform Webex.  The patient has given verbal consent for this type of encounter and has been advised to only accept a meeting of this type in a secure network environment. The time spent during this encounter was 15 minutes. The attendants for this meeting include Mardene Sayer, LPN, Hayden Pedro  and [name].  During the encounter, Mardene Sayer, LPN, and Hayden Pedro were located at [facility] Radiation Oncology Department.  TAEDEN GELLER was located also in the clinic of our department.       Carola Rhine, PAC

## 2018-11-21 ENCOUNTER — Other Ambulatory Visit: Payer: Medicare Other | Admitting: *Deleted

## 2018-11-21 ENCOUNTER — Other Ambulatory Visit: Payer: Self-pay

## 2018-11-21 ENCOUNTER — Other Ambulatory Visit: Payer: Medicare Other | Admitting: Licensed Clinical Social Worker

## 2018-11-21 DIAGNOSIS — Z515 Encounter for palliative care: Secondary | ICD-10-CM

## 2018-11-22 NOTE — Progress Notes (Signed)
COMMUNITY PALLIATIVE CARE SW NOTE  PATIENT NAME: Raymond Logan DOB: [birth year] MRN: [id number]  PRIMARY CARE PROVIDER: Biagio Borg, MD  RESPONSIBLE PARTY:  Acct ID - Guarantor Home Phone Work Phone Relationship Acct Type  [id number] Raymond [phone]  Self P/F     [address], [place], [place] [address]     PLAN OF CARE and INTERVENTIONS:             1. GOALS OF CARE/ ADVANCE CARE PLANNING:  Goal is for patient to remain in his home.  Patient is a full code. 2. SOCIAL/EMOTIONAL/SPIRITUAL ASSESSMENT/ INTERVENTIONS:  SW and Palliative Care RN, Raymond Logan, met with patient in his home.  His daughter, Raymond Logan, was on conference on her phone.  She expressed her concerns regarding patient's increased forgetfulness and he acknowledged.  His other daughter, Raymond Logan, who has been staying with him, remains in the hospital and will require assistance when she is discharged.  Patient agreed to having a housekeeper who will also fix meals.  Raymond Logan will investigate and schedule.  Patient reports enjoying his dog, which also provides him with exercise.  Patient appeared to respond positively to reminiscing. 3. PATIENT/CAREGIVER EDUCATION/ COPING:  Patient and his daughter express their feelings openly. 4. PERSONAL EMERGENCY PLAN:  Patient will sit and rest when he exerts himself.  He will contact his daughter for advice. 5. COMMUNITY RESOURCES COORDINATION/ HEALTH CARE NAVIGATION:  None. 6. FINANCIAL/LEGAL CONCERNS/INTERVENTIONS:  None.     SOCIAL HX:  Social History   Tobacco Use  . Smoking status: Former Smoker    Packs/day: 1.00    Years: 23.00    Pack years: 23.00    Quit date: 1978    Years since quitting: 42.7  . Smokeless tobacco: Never Used  Substance Use Topics  . Alcohol use: Yes    Comment: rare    CODE STATUS:  Full Code  ADVANCED DIRECTIVES: N MOST FORM COMPLETE:  N HOSPICE EDUCATION PROVIDED: N PPS:  Patient reports "never being a big eater".  He ambulates  independently. Duration of visit and documentation:  90 minutes.      Raymond Corn Itzelle Gains, LCSW

## 2018-11-23 NOTE — Progress Notes (Signed)
COMMUNITY PALLIATIVE CARE RN NOTE  PATIENT NAME: Raymond Logan DOB: 04-24-1930 MRN: [id number]  PRIMARY CARE PROVIDER: Biagio Borg, MD  RESPONSIBLE PARTY:  Acct ID - Guarantor Home Phone Work Phone Relationship Acct Type  [id number] Raymond Logan, [phone]  Self P/F     [address], [place], [place] [address]   Covid-19 Pre-screening Negative  PLAN OF CARE and INTERVENTION 1. ADVANCE CARE PLANNING/GOALS OF CARE:Goal is for patient to remain in his home. Patient is a Full code. 2. PATIENT/CAREGIVER EDUCATION: Safe Mobility 3. DISEASE STATUS: Joint visit made with Palliative Care SW, Lynn Duffy. Met with patient in his home. Daughter, Raymond Logan, joined in visit virtually. Raymond Logan is concerned with patient's decreased cognition and forgetfulness. Also, patient's other daughter, Raymond Logan, who has been staying with him is still in the hospital and will require additional care when she returns. We discussed with daughter about hiring caregivers to assist patient with household chores. Patient remains ambulatory and is independent with his ADLs. He states that he walks about 3000 steps per day. Raymond Logan prepares his pill boxes which beeps when it is time for him to take his medications. Otherwise, patient would not remember to take them. She is also concerned about patient not having much interaction with other people since the pandemic. We will increase our visits to bi-weekly vs monthly. Patient is agreeable. He has recently loss 10 lbs, which is also of concern. Patient feels that he eats enough and eats when he is hungry. Continue to encourage Ensure for nutritional supplementation. He denies dysphagia. Overall, he feels that he is managing ok. Will continue to monitor.     HISTORY OF PRESENT ILLNESS:  This is a 85 yo male who currently lives alone. His daughter visits regularly and is very supportive. Palliative care team continues to follow patient. Team will visit patient every 2 weeks and  PRN.  CODE STATUS: Full Code ADVANCED DIRECTIVES: Y MOST FORM: no PPS: 60%   PHYSICAL EXAM:   VITALS: Today's Vitals   11/21/18 1132  BP: (!) 98/54  Pulse: 85  Resp: 18  Temp: 98 F (36.7 C)  TempSrc: Temporal  SpO2: 97%  PainSc: 0-No pain    LUNGS: clear to auscultation  CARDIAC: Cor RRR EXTREMITIES: No edema SKIN: Exposed skin is dry and intact  NEURO: Alert and oriented x 2 (person/place), forgetful, ambulatory   (Duration of visit and documentation 60 minutes)   Raymond Eastern, RN BSN

## 2018-11-24 ENCOUNTER — Other Ambulatory Visit: Payer: Self-pay

## 2018-11-24 ENCOUNTER — Ambulatory Visit (INDEPENDENT_AMBULATORY_CARE_PROVIDER_SITE_OTHER): Payer: Medicare Other

## 2018-11-24 DIAGNOSIS — Z23 Encounter for immunization: Secondary | ICD-10-CM | POA: Diagnosis not present

## 2018-12-06 ENCOUNTER — Other Ambulatory Visit: Payer: Medicare Other | Admitting: *Deleted

## 2018-12-06 ENCOUNTER — Other Ambulatory Visit: Payer: Medicare Other | Admitting: Licensed Clinical Social Worker

## 2018-12-06 ENCOUNTER — Other Ambulatory Visit: Payer: Self-pay

## 2018-12-06 DIAGNOSIS — Z515 Encounter for palliative care: Secondary | ICD-10-CM

## 2018-12-06 NOTE — Progress Notes (Signed)
COMMUNITY PALLIATIVE CARE SW NOTE  PATIENT NAME: Raymond Logan DOB: 1930/09/12 MRN: [id number]  PRIMARY CARE PROVIDER: Biagio Borg, MD  RESPONSIBLE PARTY:  Acct ID - Guarantor Home Phone Work Phone Relationship Acct Type  [id number] [name]  Self P/F     [address], [place] [address]     PLAN OF CARE and INTERVENTIONS:             1. GOALS OF CARE/ ADVANCE CARE PLANNING:  Patient's goal is to remain in his home.  He is a full code. 2. SOCIAL/EMOTIONAL/SPIRITUAL ASSESSMENT/ INTERVENTIONS:  SW and Palliative Care RN, Daryl Eastern, met with patient in his home.  He repeated stories more than usual during this visit.  His new dog keeps him active.  The house also appeared more cluttered.  His daughter, [name], who lives in [place], was on facetime for part of the visit.  She keeps all of patient's personal documentation and his calendar.  She also fills his automated dispenser for medication which lasts for 21 days.  Patient denied pain.  He has frozen meals delivered.  Patient's other daughter, [name], is still in the hospital.  He appeared to respond positively to life review. 3. PATIENT/CAREGIVER EDUCATION/ COPING:  Patient copes by focusing on caring for his new dog. 4. PERSONAL EMERGENCY PLAN:  He rests as needed and will consult his daughter. 5. COMMUNITY RESOURCES COORDINATION/ HEALTH CARE NAVIGATION:  None. 6. FINANCIAL/LEGAL CONCERNS/INTERVENTIONS:  None.     SOCIAL HX:  Social History   Tobacco Use  . Smoking status: Former Smoker    Packs/day: 1.00    Years: 23.00    Pack years: 23.00    Quit date: 1978    Years since quitting: 42.7  . Smokeless tobacco: Never Used  Substance Use Topics  . Alcohol use: Yes    Comment: rare    CODE STATUS:  Full Code  ADVANCED DIRECTIVES: N MOST FORM COMPLETE:  N HOSPICE EDUCATION PROVIDED:  N PPS:  Patient reports his appetite is normal.  Patient ambulates independently. Duration of visit and  documentation:  75 minutes.      Creola Corn Bree Heinzelman, LCSW

## 2018-12-08 NOTE — Progress Notes (Signed)
COMMUNITY PALLIATIVE CARE RN NOTE  PATIENT NAME: Raymond Logan DOB: 12-02-1930 MRN: [id number]  PRIMARY CARE PROVIDER: Biagio Borg, MD  RESPONSIBLE PARTY:  Acct ID - Guarantor Home Phone Work Phone Relationship Acct Type  [id number] [name]  Self P/F     [address], [place], [place] [address]   Covid-19 Pre-screening Negative  PLAN OF CARE and INTERVENTION:  1. ADVANCE CARE PLANNING/GOALS OF CARE: Goal is for patient to remain in his home. He is a Full code. 2. PATIENT/CAREGIVER EDUCATION: Safe Mobility 3. DISEASE STATUS: Joint visit made with Palliative Care SW, Lynn Duffy. Met with patient in his home. He is alert and oriented x 3, but forgetful and more repetitive than usual. He denies pain. He reports that he does experience some shortness of breath after walking long distances at times. He has a chronic non productive cough and uses lozenges regularly during the day to help. He remains independent with ADLs. He continues to walk about 3000 steps per day. He appears thin, but says his intake is adequate. He has Boost drinks that he drinks daily for nutritional supplementation. He fixes himself simple meals that he can heat up. His daughter, [name], takes care of filling his automatic pill box dispenser and when she visits will do some light housework. She is going to look into hiring someone to assist patient with this task. [name] also takes care of his finances and manages all of his appointments. She was present on facetime for part of our visit. He has a dog that keeps him company and active. His other daughter, [name], who  recently moved in with him remains in the hospital at this time. Will continue to monitor.  HISTORY OF PRESENT ILLNESS:  This is a 85 yo male who resides at home alone. He has a very supportive family. Palliative care team continues to follow patient. Will continue to visit bi-weekly and PRN per daughter's request.  CODE STATUS: Full  code ADVANCED DIRECTIVES: Y MOST FORM: no PPS: 60%   PHYSICAL EXAM:   LUNGS: clear to auscultation  CARDIAC: Cor RRR EXTREMITIES: No edema SKIN: Exposed skin is dry and intact  NEURO: Alert and oriented x 3, forgetful and repetitive, ambulatory   (Duration of visit and documentation 75 minutes)   Daryl Eastern, RN BSN

## 2018-12-11 ENCOUNTER — Other Ambulatory Visit: Payer: Medicare Other | Admitting: Licensed Clinical Social Worker

## 2018-12-11 ENCOUNTER — Telehealth: Payer: Self-pay

## 2018-12-11 NOTE — Telephone Encounter (Signed)
Palliative Medicine RN Note:  Message left on main PMT number over the weekend by daughter [name] reporting change in caregiving situation and asking about help/plans/assistance. Patient is active with [facility] palliative care. PMT office staff will return daughter's call and refer her to [facility]. I also called [facility] and asked them to check on patient.  Marjie Skiff Mahamud Metts, RN, BSN, [facility] Palliative Medicine Team 12/11/2018 4:08 PM Office [phone]

## 2018-12-12 ENCOUNTER — Telehealth: Payer: Self-pay | Admitting: Licensed Clinical Social Worker

## 2018-12-12 NOTE — Progress Notes (Signed)
COMMUNITY PALLIATIVE CARE SW NOTE  PATIENT NAME: Raymond Logan DOB: 1931-02-17 MRN: [id number]  PRIMARY CARE PROVIDER: Biagio Borg, MD  RESPONSIBLE PARTY:  Acct ID - Guarantor Home Phone Work Phone Relationship Acct Type  [id number] ERMIAS, [phone]  Self P/F     [address], [place], [place] [address]   Due to the COVID-19 crisis, this virtual check-in visit was done via telephone from my office and it was initiated and consent given by this patientand orfamily.  PLAN OF CARE and INTERVENTIONS:             1. GOALS OF CARE/ ADVANCE CARE PLANNING:  Goal is for patient to remain in his home.  Patient is a full code. 2. SOCIAL/EMOTIONAL/SPIRITUAL ASSESSMENT/ INTERVENTIONS:  SW conducted a virtual check-in visit with patient and then his daughter, Raymond Logan.  Patient confirmed that his other daughter, Raymond Logan, is now home with him from the hospital.  He did not know if she was receiving home health.  Raymond Logan expressed concern regarding her father's health due to having to assist Raymond Logan.  Palliative Care RN and SW will visit patient to help assess patient's need. 3. PATIENT/CAREGIVER EDUCATION/ COPING:  Patient copes by attempting to maintain equilibrium with his family. 4. PERSONAL EMERGENCY PLAN:  He will contact his daughter. 5. COMMUNITY RESOURCES COORDINATION/ HEALTH CARE NAVIGATION:  None. 6. FINANCIAL/LEGAL CONCERNS/INTERVENTIONS:  None.     SOCIAL HX:  Social History   Tobacco Use  . Smoking status: Former Smoker    Packs/day: 1.00    Years: 23.00    Pack years: 23.00    Quit date: 1978    Years since quitting: 42.8  . Smokeless tobacco: Never Used  Substance Use Topics  . Alcohol use: Yes    Comment: rare    CODE STATUS:  Full Code ADVANCED DIRECTIVES: N MOST FORM COMPLETE:  N HOSPICE EDUCATION PROVIDED:  N PPS:  Patient's appetite is normal for him.  He ambulates independently. Duration of visit and documentation:  60 minutes.      Creola Corn Manilla Strieter, LCSW

## 2018-12-12 NOTE — Telephone Encounter (Signed)
Palliative Care SW phoned patient to schedule a home visit for patient tomorrow.  Visit is scheduled for 1:00 on 10/14.

## 2018-12-13 ENCOUNTER — Telehealth: Payer: Self-pay | Admitting: Licensed Clinical Social Worker

## 2018-12-13 ENCOUNTER — Other Ambulatory Visit: Payer: Medicare Other | Admitting: Licensed Clinical Social Worker

## 2018-12-13 ENCOUNTER — Other Ambulatory Visit: Payer: Self-pay

## 2018-12-13 ENCOUNTER — Other Ambulatory Visit: Payer: Medicare Other | Admitting: *Deleted

## 2018-12-13 DIAGNOSIS — Z515 Encounter for palliative care: Secondary | ICD-10-CM

## 2018-12-13 NOTE — Telephone Encounter (Signed)
Palliative Care SW phoned patient's daughter, [name], after visiting patient today.  Informed her that patient appeared to be alert and responded appropriately to questions.  He denied pain and engaged in conversation.  He expressed concern for his other daughter, [name], who had just gotten out of the hospital.  He and [name] asked for information regarding home health, which was provided.

## 2018-12-13 NOTE — Progress Notes (Signed)
COMMUNITY PALLIATIVE CARE RN NOTE  PATIENT NAME: Raymond Logan DOB: [birth year] MRN: [id number]  PRIMARY CARE PROVIDER: Biagio Borg, MD  RESPONSIBLE PARTY:  Acct ID - Guarantor Home Phone Work Phone Relationship Acct Type  [id number] [name]  Self P/F     [address], [place], [place] [address]   Covid-19 Pre-screening Negative  PLAN OF CARE and INTERVENTION:  1. ADVANCE CARE PLANNING/GOALS OF CARE: Goal is for patient to remain in his home as long as possible. He is a Full Code. 2. PATIENT/CAREGIVER EDUCATION: Safe Mobility 3. DISEASE STATUS: Joint visit made with Palliative SW, Raymond Logan. His daughter Raymond Logan, who recently returned to his home after a hospitalization, requested that our visit take place on the front porch due to [place]. Patient denies pain. He continues with forgetfulness and repetitive statements throughout conversations. He talked about how his daughter, Raymond Logan, was supposed to be living with him in order to help care for him but his daughter is who needs the help. He inquired about a hired caregiver to assist with her personal care. List of agencies provided. He says that he is eating adequately, however he continues to appear more thin/frail and he made the comment that he is going to need a smaller belt soon because he is running out of holes and his pants are getting looser. He says he eats 3 meals/day and drinks at least 1-2 Boost supplements per day. He continues to perform ADLs independently and walk about 3000 steps per day. He remains able to drive. Occasional dry cough. He continues to use lozenges to help. Will continue to monitor.  HISTORY OF PRESENT ILLNESS:  This is a 85 yo male who resides in his home and his daughter, Raymond Logan, is currently living with him. Palliative care team continues to follow patient. Next visit scheduled in 1 week.   CODE STATUS: Full Code  ADVANCED DIRECTIVES: N MOST FORM: no PPS: 60%   PHYSICAL EXAM:   LUNGS: clear to  auscultation  CARDIAC: Cor RRR EXTREMITIES: No edema SKIN: Exposed skin is dry and intact  NEURO: Alert and oriented x 3, forgetful/repetitive, ambulatory   (Duration of visit and documentation 60 minutes)   Raymond Eastern, RN BSN

## 2018-12-13 NOTE — Progress Notes (Signed)
COMMUNITY PALLIATIVE CARE SW NOTE  PATIENT NAME: Raymond Logan DOB: 1931/02/03 MRN: [id number]  PRIMARY CARE PROVIDER: Biagio Borg, MD  RESPONSIBLE PARTY:  Acct ID - Guarantor Home Phone Work Phone Relationship Acct Type  [id number] [name]  Self P/F     [address], [place], [place] [address]     PLAN OF CARE and INTERVENTIONS:             1. GOALS OF CARE/ ADVANCE CARE PLANNING:  Goal is for patient to remain at home.  Patient is a full code. 2. SOCIAL/EMOTIONAL/SPIRITUAL ASSESSMENT/ INTERVENTIONS:  SW and Palliative Care RN, Daryl Eastern, met with patient at his home per the request of his daughter, [name].  His daughter, [name], requested to meet outside on the patio.  She has just returned from a lengthy stay at the hospital and is concerned about COVID-19.  She also requested information about home health for herself and asked it to be given to the patient.  SW and RN wrote the information down for them.  Encouraged patient or his daughter to contact her primary MD for assistance.  Patient said he thought she would be taking care of him, not him taking care of her.  He was alert and engaged in conversation.  He denied any pain or exertion issues while helping his daughter.  His dog continues to be a good companion and stress reliever for patient. 3. PATIENT/CAREGIVER EDUCATION/ COPING:  Patient copes by focusing on the positive aspects of his life.  4. PERSONAL EMERGENCY PLAN:  He will contact his daughter, [name]. 5. COMMUNITY RESOURCES COORDINATION/ HEALTH CARE NAVIGATION:  None. 6. FINANCIAL/LEGAL CONCERNS/INTERVENTIONS:  None.     SOCIAL HX:  Social History   Tobacco Use  . Smoking status: Former Smoker    Packs/day: 1.00    Years: 23.00    Pack years: 23.00    Quit date: 1978    Years since quitting: 42.8  . Smokeless tobacco: Never Used  Substance Use Topics  . Alcohol use: Yes    Comment: rare    CODE STATUS:  Full Code  ADVANCED  DIRECTIVES: N MOST FORM COMPLETE:  N HOSPICE EDUCATION PROVIDED:  N PPS:  Patient said his appetite is normal.  He ambulates independently. Duration of visit and documentation:  60 minutes.      Creola Corn Akeelah Seppala, LCSW

## 2018-12-20 ENCOUNTER — Other Ambulatory Visit: Payer: Medicare Other | Admitting: Licensed Clinical Social Worker

## 2018-12-20 ENCOUNTER — Other Ambulatory Visit: Payer: Medicare Other | Admitting: *Deleted

## 2018-12-20 ENCOUNTER — Other Ambulatory Visit: Payer: Self-pay

## 2018-12-20 DIAGNOSIS — Z515 Encounter for palliative care: Secondary | ICD-10-CM

## 2018-12-20 NOTE — Progress Notes (Signed)
COMMUNITY PALLIATIVE CARE RN NOTE  PATIENT NAME: Raymond Logan DOB: 1930-11-03 MRN: [id number]  PRIMARY CARE PROVIDER: Biagio Borg, MD  RESPONSIBLE PARTY:  Acct ID - Guarantor Home Phone Work Phone Relationship Acct Type  [id number] [name]  Self P/F     [address], [place], [place] [address]   Covid-19 Pre-screening Negative  PLAN OF CARE and INTERVENTION:  1. ADVANCE CARE PLANNING/GOALS OF CARE: Goal is for patient to remain in his home.  2. PATIENT/CAREGIVER EDUCATION: Safe Mobility 3. DISEASE STATUS: Joint visit made with Palliative care SW, Lynn Duffy. Met with patient outside on the front porch as requested by his daughter, [name]. He reports some soreness in his R arm/shoulder. Unsure of cause. He feels he may have slept on it for too long. He continues with progressive forgetfulness and very repetitive throughout visit. He continues to walk daily ~3000 steps. He says that he feels that his appetite is good. He continues to drink Boost daily for nutritional supplementation. He is able to perform ADLs independently. He did say that he was giving his car to his son within the next few weeks because he does not feel that he should be driving much. He is going to drive his daughter to a doctor's appointment this afternoon. [name] has been feeling better and able to help him more with meal preparation. He says that he has been feeling fine. Will continue to monitor.   HISTORY OF PRESENT ILLNESS:  This is a 85 yo male who resides at home. His daughter, [name], is currently staying with him. Palliative care team continues to follow patient. Will continue to visit monthly and PRN.  CODE STATUS: Full Code ADVANCED DIRECTIVES: N MOST FORM: no PPS: 60%   (Duration of visit and documentation 45 minutes)   Daryl Eastern, RN BSN

## 2018-12-20 NOTE — Progress Notes (Signed)
COMMUNITY PALLIATIVE CARE SW NOTE  PATIENT NAME: Raymond Logan DOB: 05-30-1930 MRN: [id number]  PRIMARY CARE PROVIDER: Biagio Borg, MD  RESPONSIBLE PARTY:  Acct ID - Guarantor Home Phone Work Phone Relationship Acct Type  [id number] Raymond [phone]  Self P/F     [address], [place] [address]     PLAN OF CARE and INTERVENTIONS:             1. GOALS OF CARE/ ADVANCE CARE PLANNING:  Patient wishes to remain in his home.  He is a full code. 2. SOCIAL/EMOTIONAL/SPIRITUAL ASSESSMENT/ INTERVENTIONS:  SW and Palliative Care RN, Raymond Logan, met with patient at his home.  His daughter, Raymond Logan, requested the visit take place on the front porch.  Patient repeated himself more often during this visit.  He is also giving his car to his son and stated he should not be driving as much.  He will use a driving service as needed.  Raymond Logan appeared to be feeling better and is more able to care for herself. 3. PATIENT/CAREGIVER EDUCATION/ COPING:  Patient continues to cope by focusing on the positive events in his life.  4. PERSONAL EMERGENCY PLAN:  Patient will inform his daughter, Raymond Logan. 5. COMMUNITY RESOURCES COORDINATION/ HEALTH CARE NAVIGATION:  None. 6. FINANCIAL/LEGAL CONCERNS/INTERVENTIONS:  None.     SOCIAL HX:  Social History   Tobacco Use  . Smoking status: Former Smoker    Packs/day: 1.00    Years: 23.00    Pack years: 23.00    Quit date: 1978    Years since quitting: 42.8  . Smokeless tobacco: Never Used  Substance Use Topics  . Alcohol use: Yes    Comment: rare    CODE STATUS:  Full Code ADVANCED DIRECTIVES: N MOST FORM COMPLETE:  N HOSPICE EDUCATION PROVIDED:  N PPS:  Appetite is normal.  He independently ambulates. Duration of visit and documentation:  60 minutes.      Raymond Corn Raymond Kurka, LCSW

## 2018-12-21 ENCOUNTER — Ambulatory Visit (INDEPENDENT_AMBULATORY_CARE_PROVIDER_SITE_OTHER): Payer: Medicare Other | Admitting: Internal Medicine

## 2018-12-21 ENCOUNTER — Encounter: Payer: Self-pay | Admitting: Internal Medicine

## 2018-12-21 DIAGNOSIS — R739 Hyperglycemia, unspecified: Secondary | ICD-10-CM | POA: Diagnosis not present

## 2018-12-21 DIAGNOSIS — R42 Dizziness and giddiness: Secondary | ICD-10-CM | POA: Insufficient documentation

## 2018-12-21 DIAGNOSIS — G3184 Mild cognitive impairment, so stated: Secondary | ICD-10-CM

## 2018-12-21 NOTE — Patient Instructions (Signed)
Ok to hold the lasix x 3 days  Please continue all other medications as before, and refills have been done if requested.  Please have the pharmacy call with any other refills you may need.  Please continue your efforts at being more active, low cholesterol diet, and weight control.  Please keep your appointments with your specialists as you may have planned

## 2018-12-21 NOTE — Progress Notes (Signed)
Patient ID: Raymond Logan, male   DOB: 1930/07/10, 85 y.o.   MRN: [id number]  Virtual Visit via Video Note  I connected with Raymond Logan on 12/21/18 at  4:20 PM EDT by a video enabled telemedicine application and verified that I am speaking with the correct person using two identifiers.  Location: Patient: at home Provider: at office   I discussed the limitations of evaluation and management by telemedicine and the availability of in person appointments. The patient expressed understanding and agreed to proceed.  History of Present Illness: Here to f/u; overall doing ok,  Pt denies chest pain, increasing sob or doe, wheezing, orthopnea, PND, increased LE swelling, palpitations, or syncope but has had unusual dizziness for 3 days with first standing up..  Pt denies new neurological symptoms such as new headache, or facial or extremity weakness or numbness.  Pt denies polydipsia, polyuria, or low sugar episode.  Pt states overall good compliance with meds, mostly trying to follow appropriate diet, with wt overall stable.   Pt denies fever, wt loss, night sweats, loss of appetite, or other constitutional symptoms.  Dementia overall stable symptomatically, and not assoc with behavioral changes such as hallucinations, paranoia, or agitation. Past Medical History:  Diagnosis Date  . ALLERGIC RHINITIS 01/23/2007  . Arthritis    hands  . BENIGN PROSTATIC HYPERTROPHY 09/10/2006  . Chronic headaches   . Chronic sinusitis   . COPD, MILD 04/03/2010   patient denies on preop visit of 08/02/2014   . Difficulty in urination   . Dyspnea   . FATIGUE 01/08/2008  . GERD 04/15/2010  . H. pylori infection    hx of   . Headache(784.0) 09/10/2006  . HOARSENESS 04/15/2010  . HYPERLIPIDEMIA 09/10/2006  . INSOMNIA-SLEEP DISORDER-UNSPEC 04/24/2009  . Lung cancer ([place])   . PAF (paroxysmal atrial fibrillation) ([place])    identified on EKG 04/08/17  . PEPTIC ULCER DISEASE 01/23/2007  . Pneumonia    1955   Past  Surgical History:  Procedure Laterality Date  . CATARACT EXTRACTION Left   . COLONOSCOPY    . CYSTOSCOPY N/A 10/07/2016   Procedure: CYSTOSCOPY;  Surgeon: Festus Aloe, MD;  Location: [facility];  Service: Urology;  Laterality: N/A;  . FUDUCIAL PLACEMENT Right 02/06/2018   Procedure: PLACEMENT OF FIDUCIAL RIGHT LOWER LOBE LUNG;  Surgeon: [name], MD;  Location: [facility];  Service: Thoracic;  Laterality: Right;  . HEMORRHOID BANDING    . INSERTION OF SUPRAPUBIC CATHETER N/A 10/07/2016   Procedure: FOLEY  CATHETER PLACEMENT;  Surgeon: Festus Aloe, MD;  Location: [facility];  Service: Urology;  Laterality: N/A;  . KNEE ARTHROSCOPY Left    torn meniscus  . LUMBAR LAMINECTOMY/DECOMPRESSION MICRODISCECTOMY Right 08/07/2014   Procedure: complete DECOMPRESSION LUMBAR LAMINECTOMY/MICRODISCECTOMY OF L4-L5 for spinal stenosis, DECOMPRESSION OF L3-L4;  Surgeon: Latanya Maudlin, MD;  [facility];  Service: Orthopedics;  Laterality: Right;  . NASAL SINUS SURGERY Left 07/04/2017   Procedure: LEFT ENDOSCOPIC SINUS SURGERY;  Surgeon: Jerrell Belfast, MD;  Location: [facility];  Service: ENT;  Laterality: Left;  . RIGHT/LEFT HEART CATH AND CORONARY ANGIOGRAPHY N/A 06/21/2017   Procedure: RIGHT/LEFT HEART CATH AND CORONARY ANGIOGRAPHY;  Surgeon: Belva Crome, MD;  Location: [facility];  Service: Cardiovascular;  Laterality: N/A;  . ROTATOR CUFF REPAIR Right   . SHOULDER ARTHROSCOPY     x3, L & R  . SINUS ENDO WITH FUSION N/A 07/04/2017   Procedure: SINUS ENDO WITH FUSION;  Surgeon: Jerrell Belfast,  MD;  Location: [name];  Service: ENT;  Laterality: N/A;  . VIDEO ASSISTED THORACOSCOPY (VATS)/ LOBECTOMY Right 10/07/2016   Procedure: VIDEO ASSISTED THORACOSCOPY (VATS)/RIGHT MIDDLE LOBECTOMY;  Surgeon: [name], MD;  Location: [facility];  Service: Thoracic;  Laterality: Right;  [name] VIDEO BRONCHOSCOPY WITH ENDOBRONCHIAL NAVIGATION N/A 09/23/2016   Procedure: VIDEO BRONCHOSCOPY WITH ENDOBRONCHIAL  NAVIGATION;  Surgeon: [name], MD;  Location: [facility];  Service: Thoracic;  Laterality: N/A;  . VIDEO BRONCHOSCOPY WITH ENDOBRONCHIAL NAVIGATION N/A 02/06/2018   Procedure: VIDEO BRONCHOSCOPY WITH ENDOBRONCHIAL NAVIGATION;  Surgeon: [name], MD;  Location: [facility];  Service: Thoracic;  Laterality: N/A;  . VIDEO BRONCHOSCOPY WITH ENDOBRONCHIAL ULTRASOUND N/A 09/23/2016   Procedure: VIDEO BRONCHOSCOPY WITH ENDOBRONCHIAL ULTRASOUND;  Surgeon: [name], MD;  Location: [facility];  Service: Thoracic;  Laterality: N/A;    reports that he quit smoking about 42 years ago. He has a 23.00 pack-year smoking history. He has never used smokeless tobacco. He reports current alcohol use. He reports that he does not use drugs. family history includes Alzheimer's disease in his mother and sister; Prostate cancer in his brother. Allergies  Allergen Reactions  . Alfuzosin Other (See Comments) and Hypertension    BP went crazy, dizzy, passing out   . Aricept [Donepezil Hcl] Other (See Comments)    Insomnia, headache  . Penicillins Hives and Other (See Comments)    A PCN REACTION WITH IMMEDIATE RASH, FACIAL/TONGUE/THROAT SWELLING, SOB, OR LIGHTHEADEDNESS WITH HYPOTENSION:  #  #  #  YES  #  #  #   Has patient had a PCN reaction causing severe rash involving mucus membranes or skin necrosis:No Has patient had a PCN reaction that required hospitalization:No Has patient had a PCN reaction occurring within the last 10 years:No If all of the above answers are "NO", then may proceed with Cephalosporin use.    Current Outpatient Medications on File Prior to Visit  Medication Sig Dispense Refill  . albuterol (PROVENTIL HFA;VENTOLIN HFA) 108 (90 Base) MCG/ACT inhaler Inhale 2 puffs into the lungs every 6 (six) hours as needed for wheezing or shortness of breath. 1 Inhaler 11  . calcium carbonate (TUMS - DOSED IN MG ELEMENTAL CALCIUM) 500 MG chewable tablet Chew 1 tablet by mouth daily as  needed for indigestion or heartburn.     . diazepam (VALIUM) 5 MG tablet 1/2 - 1 tab by mouth daily as needed for vertigo 10 tablet 0  . ELIQUIS 5 MG TABS tablet TAKE 1 TABLET BY MOUTH TWICE DAILY 180 tablet 1  . finasteride (PROSCAR) 5 MG tablet Take 5 mg by mouth at bedtime.     . fluticasone (FLONASE) 50 MCG/ACT nasal spray Place 2 sprays into both nostrils daily as needed for allergies or rhinitis.   11  . furosemide (LASIX) 20 MG tablet TAKE 1 TABLET(20 MG) BY MOUTH DAILY 90 tablet 0  . meclizine (ANTIVERT) 12.5 MG tablet 1-2 tablets by mouth every 8 hrs as needed for vertigo 40 tablet 1  . ondansetron (ZOFRAN) 4 MG tablet Take 1 tablet (4 mg total) by mouth every 8 (eight) hours as needed for nausea or vomiting. (Patient not taking: Reported on 11/20/2018) 20 tablet 1  . propafenone (RYTHMOL) 225 MG tablet Take 1 tablet (225 mg total) by mouth 2 (two) times daily. Please keep upcoming appt in October with Dr. Lovena Le for future refills. Thank you 180 tablet 0  . rivastigmine (EXELON) 1.5 MG capsule TAKE 1 CAPSULE(1.5 MG)  BY MOUTH TWICE DAILY 60 capsule 6  . silodosin (RAPAFLO) 8 MG CAPS capsule Take 8 mg by mouth at bedtime.     [name] zolpidem (AMBIEN) 5 MG tablet Take 1 tablet (5 mg total) by mouth at bedtime as needed. for sleep 30 tablet 5   No current facility-administered medications on file prior to visit.     Observations/Objective: Alert, NAD, appropriate mood and affect, resps normal, cn 2-12 intact, moves all 4s, no visible rash or swelling Lab Results  Component Value Date   WBC 5.1 11/13/2018   HGB 10.5 (L) 11/13/2018   HCT 31.5 (L) 11/13/2018   PLT 242 11/13/2018   GLUCOSE 128 (H) 11/13/2018   CHOL 169 03/18/2017   TRIG 122.0 03/18/2017   HDL 35.90 (L) 03/18/2017   LDLDIRECT 134.4 03/27/2010   LDLCALC 108 (H) 03/18/2017   ALT <6 11/13/2018   AST 9 (L) 11/13/2018   NA 141 11/13/2018   K 4.1 11/13/2018   CL 105 11/13/2018   CREATININE 1.13 11/13/2018   BUN 18 11/13/2018    CO2 31 11/13/2018   TSH 0.92 11/09/2017   PSA 1.91 03/18/2017   INR 1.10 02/06/2018   HGBA1C 4.9 04/12/2018   Assessment and Plan: See notes  Follow Up Instructions: See notes   I discussed the assessment and treatment plan with the patient. The patient was provided an opportunity to ask questions and all were answered. The patient agreed with the plan and demonstrated an understanding of the instructions.   The patient was advised to call back or seek an in-person evaluation if the symptoms worsen or if the condition fails to improve as anticipated.   Cathlean Cower, MD

## 2018-12-21 NOTE — Assessment & Plan Note (Signed)
stable overall by history and exam, recent data reviewed with pt, and pt to continue medical treatment as before,  to f/u any worsening symptoms or concerns  

## 2018-12-21 NOTE — Assessment & Plan Note (Signed)
I suspect mild orthostasis, to hold lasix x 3 days, then retart

## 2018-12-29 ENCOUNTER — Encounter: Payer: Self-pay | Admitting: Internal Medicine

## 2018-12-29 ENCOUNTER — Ambulatory Visit (INDEPENDENT_AMBULATORY_CARE_PROVIDER_SITE_OTHER): Payer: Medicare Other | Admitting: Internal Medicine

## 2018-12-29 ENCOUNTER — Other Ambulatory Visit: Payer: Self-pay | Admitting: Internal Medicine

## 2018-12-29 ENCOUNTER — Other Ambulatory Visit: Payer: Self-pay

## 2018-12-29 VITALS — BP 86/58 | HR 91 | Ht 69.0 in | Wt 144.0 lb

## 2018-12-29 DIAGNOSIS — I480 Paroxysmal atrial fibrillation: Secondary | ICD-10-CM

## 2018-12-29 NOTE — Progress Notes (Signed)
HPI Raymond Logan returns today for followup of PAF, chronic diastolic heart failure, and prior lung CA. He has seen Dr. Roxan Hockey and his repeat biopsy showed CA. He has undergone radiation.  He still gets sob with exertion. He has not had palpitations. He has some trouble going to sleep at night. Allergies  Allergen Reactions  . Alfuzosin Other (See Comments) and Hypertension    BP went crazy, dizzy, passing out   . Aricept [Donepezil Hcl] Other (See Comments)    Insomnia, headache  . Penicillins Hives and Other (See Comments)    A PCN REACTION WITH IMMEDIATE RASH, FACIAL/TONGUE/THROAT SWELLING, SOB, OR LIGHTHEADEDNESS WITH HYPOTENSION:  #  #  #  YES  #  #  #   Has patient had a PCN reaction causing severe rash involving mucus membranes or skin necrosis:No Has patient had a PCN reaction that required hospitalization:No Has patient had a PCN reaction occurring within the last 10 years:No If all of the above answers are "NO", then may proceed with Cephalosporin use.      Current Outpatient Medications  Medication Sig Dispense Refill  . albuterol (PROVENTIL HFA;VENTOLIN HFA) 108 (90 Base) MCG/ACT inhaler Inhale 2 puffs into the lungs every 6 (six) hours as needed for wheezing or shortness of breath. 1 Inhaler 11  . calcium carbonate (TUMS - DOSED IN MG ELEMENTAL CALCIUM) 500 MG chewable tablet Chew 1 tablet by mouth daily as needed for indigestion or heartburn.     [name] ELIQUIS 5 MG TABS tablet TAKE 1 TABLET BY MOUTH TWICE DAILY 180 tablet 1  . finasteride (PROSCAR) 5 MG tablet Take 5 mg by mouth at bedtime.     . fluticasone (FLONASE) 50 MCG/ACT nasal spray Place 2 sprays into both nostrils daily as needed for allergies or rhinitis.   11  . furosemide (LASIX) 20 MG tablet TAKE 1 TABLET(20 MG) BY MOUTH DAILY 90 tablet 0  . meclizine (ANTIVERT) 12.5 MG tablet 1-2 tablets by mouth every 8 hrs as needed for vertigo 40 tablet 1  . ondansetron (ZOFRAN) 4 MG tablet Take 1 tablet (4 mg  total) by mouth every 8 (eight) hours as needed for nausea or vomiting. 20 tablet 1  . propafenone (RYTHMOL) 225 MG tablet Take 1 tablet (225 mg total) by mouth 2 (two) times daily. Please keep upcoming appt in October with Dr. Lovena Le for future refills. Thank you 180 tablet 0  . rivastigmine (EXELON) 1.5 MG capsule TAKE 1 CAPSULE(1.5 MG) BY MOUTH TWICE DAILY 60 capsule 6  . silodosin (RAPAFLO) 8 MG CAPS capsule Take 8 mg by mouth at bedtime.     [name] zolpidem (AMBIEN) 5 MG tablet Take 1 tablet (5 mg total) by mouth at bedtime as needed. for sleep 30 tablet 5   No current facility-administered medications for this visit.      Past Medical History:  Diagnosis Date  . ALLERGIC RHINITIS 01/23/2007  . Arthritis    hands  . BENIGN PROSTATIC HYPERTROPHY 09/10/2006  . Chronic headaches   . Chronic sinusitis   . COPD, MILD 04/03/2010   patient denies on preop visit of 08/02/2014   . Difficulty in urination   . Dyspnea   . FATIGUE 01/08/2008  . GERD 04/15/2010  . H. pylori infection    hx of   . Headache(784.0) 09/10/2006  . HOARSENESS 04/15/2010  . HYPERLIPIDEMIA 09/10/2006  . INSOMNIA-SLEEP DISORDER-UNSPEC 04/24/2009  . Lung cancer ([place])   . PAF (paroxysmal atrial fibrillation) ([place])  identified on EKG 04/08/17  . PEPTIC ULCER DISEASE 01/23/2007  . Pneumonia    1955    ROS:   All systems reviewed and negative except as noted in the HPI.   Past Surgical History:  Procedure Laterality Date  . CATARACT EXTRACTION Left   . COLONOSCOPY    . CYSTOSCOPY N/A 10/07/2016   Procedure: CYSTOSCOPY;  Surgeon: Festus Aloe, MD;  Location: [facility];  Service: Urology;  Laterality: N/A;  . FUDUCIAL PLACEMENT Right 02/06/2018   Procedure: PLACEMENT OF FIDUCIAL RIGHT LOWER LOBE LUNG;  Surgeon: [name], MD;  Location: [facility];  Service: Thoracic;  Laterality: Right;  . HEMORRHOID BANDING    . INSERTION OF SUPRAPUBIC CATHETER N/A 10/07/2016   Procedure: FOLEY  CATHETER PLACEMENT;  Surgeon:  Festus Aloe, MD;  Location: [facility];  Service: Urology;  Laterality: N/A;  . KNEE ARTHROSCOPY Left    torn meniscus  . LUMBAR LAMINECTOMY/DECOMPRESSION MICRODISCECTOMY Right 08/07/2014   Procedure: complete DECOMPRESSION LUMBAR LAMINECTOMY/MICRODISCECTOMY OF L4-L5 for spinal stenosis, DECOMPRESSION OF L3-L4;  Surgeon: Latanya Maudlin, MD;  [facility];  Service: Orthopedics;  Laterality: Right;  . NASAL SINUS SURGERY Left 07/04/2017   Procedure: LEFT ENDOSCOPIC SINUS SURGERY;  Surgeon: Jerrell Belfast, MD;  Location: [facility];  Service: ENT;  Laterality: Left;  . RIGHT/LEFT HEART CATH AND CORONARY ANGIOGRAPHY N/A 06/21/2017   Procedure: RIGHT/LEFT HEART CATH AND CORONARY ANGIOGRAPHY;  Surgeon: Belva Crome, MD;  Location: [facility];  Service: Cardiovascular;  Laterality: N/A;  . ROTATOR CUFF REPAIR Right   . SHOULDER ARTHROSCOPY     x3, L & R  . SINUS ENDO WITH FUSION N/A 07/04/2017   Procedure: SINUS ENDO WITH FUSION;  Surgeon: Jerrell Belfast, MD;  Location: [facility];  Service: ENT;  Laterality: N/A;  . VIDEO ASSISTED THORACOSCOPY (VATS)/ LOBECTOMY Right 10/07/2016   Procedure: VIDEO ASSISTED THORACOSCOPY (VATS)/RIGHT MIDDLE LOBECTOMY;  Surgeon: [name], MD;  Location: [facility];  Service: Thoracic;  Laterality: Right;  [name] VIDEO BRONCHOSCOPY WITH ENDOBRONCHIAL NAVIGATION N/A 09/23/2016   Procedure: VIDEO BRONCHOSCOPY WITH ENDOBRONCHIAL NAVIGATION;  Surgeon: [name], MD;  Location: [facility];  Service: Thoracic;  Laterality: N/A;  . VIDEO BRONCHOSCOPY WITH ENDOBRONCHIAL NAVIGATION N/A 02/06/2018   Procedure: VIDEO BRONCHOSCOPY WITH ENDOBRONCHIAL NAVIGATION;  Surgeon: [name], MD;  [facility];  Service: Thoracic;  Laterality: N/A;  . VIDEO BRONCHOSCOPY WITH ENDOBRONCHIAL ULTRASOUND N/A 09/23/2016   Procedure: VIDEO BRONCHOSCOPY WITH ENDOBRONCHIAL ULTRASOUND;  Surgeon: [name], MD;  [facility];  Service: Thoracic;  Laterality: N/A;      Family History  Problem Relation Age of Onset  . Prostate cancer Brother   . Alzheimer's disease Mother   . Alzheimer's disease Sister      Social History   Socioeconomic History  . Marital status: Widowed    Spouse name: Not on file  . Number of children: 3  . Years of education: Not on file  . Highest education level: Not on file  Occupational History  . Occupation: Retired    Fish farm manager: RETIRED  Social Needs  . Financial resource strain: Not on file  . Food insecurity    Worry: Not on file    Inability: Not on file  . Transportation needs    Medical: No    Non-medical: No  Tobacco Use  . Smoking status: Former Smoker    Packs/day: 1.00    Years: 23.00    Pack years: 23.00  Quit date: 55    Years since quitting: 42.8  . Smokeless tobacco: Never Used  Substance and Sexual Activity  . Alcohol use: Yes    Comment: rare  . Drug use: No  . Sexual activity: Not on file  Lifestyle  . Physical activity    Days per week: Not on file    Minutes per session: Not on file  . Stress: Not on file  Relationships  . Social Herbalist on phone: Not on file    Gets together: Not on file    Attends religious service: Not on file    Active member of club or organization: Not on file    Attends meetings of clubs or organizations: Not on file    Relationship status: Not on file  . Intimate partner violence    Fear of current or ex partner: Not on file    Emotionally abused: Not on file    Physically abused: Not on file    Forced sexual activity: Not on file  Other Topics Concern  . Not on file  Social History Narrative  . Not on file     BP (!) 86/58   Pulse 91   Ht 5\' 9"  (1.753 m)   Wt 144 lb (65.3 kg)   SpO2 96%   BMI 21.27 kg/m   Physical Exam:  Well appearing NAD HEENT: Unremarkable Neck:  No JVD, no thyromegally Lymphatics:  No adenopathy Back:  No CVA tenderness Lungs:  Clear with no wheezes HEART:  Regular rate rhythm, no murmurs, no  rubs, no clicks Abd:  soft, positive bowel sounds, no organomegally, no rebound, no guarding Ext:  2 plus pulses, no edema, no cyanosis, no clubbing Skin:  No rashes no nodules Neuro:  CN II through XII intact, motor grossly intact  EKG - nsr   Assess/Plan: 1. PAF - he is maintaining NSR on propafenone. No change. 2. Lung CA - he is s/p XRT. He will undergo watchful waiting under the direction of Dr. Julien Nordmann.  3. Sob - he has dyspnea with exertion though if he walks slowly he does ok. He has no evidence of volume overload on exam.  Mikle Bosworth.D.

## 2018-12-29 NOTE — Patient Instructions (Signed)

## 2019-01-15 ENCOUNTER — Telehealth: Payer: Self-pay | Admitting: Licensed Clinical Social Worker

## 2019-01-15 NOTE — Telephone Encounter (Signed)
Palliative Care SW spoke with patient and confirmed a home visit for Friday, 11/20, at 1pm.

## 2019-01-19 ENCOUNTER — Other Ambulatory Visit: Payer: Medicare Other | Admitting: *Deleted

## 2019-01-19 ENCOUNTER — Other Ambulatory Visit: Payer: Medicare Other | Admitting: Licensed Clinical Social Worker

## 2019-01-19 ENCOUNTER — Other Ambulatory Visit: Payer: Self-pay

## 2019-01-19 DIAGNOSIS — Z515 Encounter for palliative care: Secondary | ICD-10-CM

## 2019-01-22 NOTE — Progress Notes (Signed)
COMMUNITY PALLIATIVE CARE SW NOTE  PATIENT NAME: Raymond Logan DOB: [birth year] MRN: [id number]  PRIMARY CARE PROVIDER: Biagio Borg, MD  RESPONSIBLE PARTY:  Acct ID - Guarantor Home Phone Work Phone Relationship Acct Type  [id number] Raymond [phone]  Self P/F     [address], [place] [address]     PLAN OF CARE and INTERVENTIONS:             1. GOALS OF CARE/ ADVANCE CARE PLANNING:  Patient's goal is to remain at home.  Patient is a full code. 2. SOCIAL/EMOTIONAL/SPIRITUAL ASSESSMENT/ INTERVENTIONS:  SW and Palliative Care RN, Daryl Eastern, met with patient at his home.  Patient reports back discomfort, which usually dissipates.  He continues to take walks with his dog.  His daughter, Raymond Logan, is recovering from her illness and is able to assist patient more.  He is eating better due to her cooking and preparing meals.  He repeated stories several times during this visit. 3. PATIENT/CAREGIVER EDUCATION/ COPING:  Patient copes by focusing on the positive aspects of his situation. 4. PERSONAL EMERGENCY PLAN:  He will contact his daughter, Raymond Logan, for medical concerns. 5. COMMUNITY RESOURCES COORDINATION/ HEALTH CARE NAVIGATION:  None. 6. FINANCIAL/LEGAL CONCERNS/INTERVENTIONS:  None.     SOCIAL HX:  Social History   Tobacco Use  . Smoking status: Former Smoker    Packs/day: 1.00    Years: 23.00    Pack years: 23.00    Quit date: 1978    Years since quitting: 42.9  . Smokeless tobacco: Never Used  Substance Use Topics  . Alcohol use: Yes    Comment: rare    CODE STATUS:  Full Code  ADVANCED DIRECTIVES: N MOST FORM COMPLETE: N HOSPICE EDUCATION PROVIDED: N PPS:  Patient reports his appetite is normal.  He ambulates independently. Duration of visit and documentation:  60 minutes.      Creola Corn Shadd Dunstan, LCSW

## 2019-01-23 NOTE — Progress Notes (Signed)
COMMUNITY PALLIATIVE CARE RN NOTE  PATIENT NAME: Raymond Logan DOB: 03-23-1930 MRN: [id number]  PRIMARY CARE PROVIDER: Biagio Borg, MD  RESPONSIBLE PARTY:  Acct ID - Guarantor Home Phone Work Phone Relationship Acct Type  [id number] [name]  Self P/F     [address] [place], [place] [address]   Covid-19 Pre-screening Negative  PLAN OF CARE and INTERVENTION:  1. ADVANCE CARE PLANNING/GOALS OF CARE: Goal is for him to remain in his home for as long as possible. He is a Full code. 2. PATIENT/CAREGIVER EDUCATION: Pain Management 3. DISEASE STATUS: Joint visit made with Palliative Care SW, Lynn Duffy. Upon my arrival, patient had just come back from a walk in the neighborhood with his dog. Met with patient outside on the porch. He continues with forgetfulness and is more repetitive this visit. He reports pain in his lower back, that seems more severe today. He states that usually after he wakes up in the morning and moves around, the pain goes away within 5 minutes. Today, it is seeming to linger. Walking helped some, but it is still present. He says his muscles feel tight and feels that his pain may be a result of how he slept. No dyspnea noted during visit. He states that he is coughing less and now only requiring about one cough drop per day, when in the past he would take several throughout the day. His daughter, Raymond Logan, is currently living with him. Her health is improving and she is now able to assist patient more with meals and household chores. He remains able to perform ADLs independently. He is no longer driving. He gave his car to his son and now relies on his daughter to take him where he needs to go. He decided this on his own as he does not feel as confident with driving anymore. He feels that his intake is adequate and he drinks about 1-2 Boosts per day for nutritional supplementation. Will continue to monitor.   HISTORY OF PRESENT ILLNESS:  This is a 85 yo male who  resides at home. His daughter, Raymond Logan, is currently living with him. Palliative care team continue to follow patient. Will continue to visit monthly and PRN.  CODE STATUS: Full Code ADVANCED DIRECTIVES: Y MOST FORM: no PPS: 50%   (Duration of visit and documentation 60 minutes)   Daryl Eastern, RN BSN

## 2019-01-29 DIAGNOSIS — R972 Elevated prostate specific antigen [PSA]: Secondary | ICD-10-CM | POA: Diagnosis not present

## 2019-01-29 DIAGNOSIS — R3915 Urgency of urination: Secondary | ICD-10-CM | POA: Diagnosis not present

## 2019-01-29 DIAGNOSIS — N401 Enlarged prostate with lower urinary tract symptoms: Secondary | ICD-10-CM | POA: Diagnosis not present

## 2019-02-07 ENCOUNTER — Other Ambulatory Visit: Payer: Self-pay

## 2019-02-07 ENCOUNTER — Other Ambulatory Visit: Payer: Medicare Other | Admitting: *Deleted

## 2019-02-07 DIAGNOSIS — Z515 Encounter for palliative care: Secondary | ICD-10-CM

## 2019-02-07 NOTE — Progress Notes (Signed)
COMMUNITY PALLIATIVE CARE RN NOTE  PATIENT NAME: Raymond Logan DOB: [birth year] MRN: [id number]  PRIMARY CARE PROVIDER: Biagio Borg, MD  RESPONSIBLE PARTY:  Acct ID - Guarantor Home Phone Work Phone Relationship Acct Type  [id number] [name], [name]  Self P/F     [address] [place], [place] [address]   Due to the COVID-19 crisis, this virtual check-in visit was done via telephone from my office and it was initiated and consent by this patient and or family.  PLAN OF CARE and INTERVENTION:  1. ADVANCE CARE PLANNING/GOALS OF CARE: Goal is for patient to remain in his home. He is a Full code. 2. PATIENT/CAREGIVER EDUCATION: N/A 3. DISEASE STATUS: Virtual check-in visit completed via telephone. Patient denies pain. He remains able to answer questions appropriately but is forgetful and repetitive. He says that his daughter, [name], who has been living with him is now back in the hospital. He is concerned about her condition but says that he is doing ok in her absence. He is still taking daily walks with his dog. His daughter, [name], continues to fill his pill box and is also having groceries delivered to his residence. He says that the foods are easy to prepare. He feels that his intake is adequate and that his weight has stabilized. He continues to drink 1-2 Boost per day for nutritional supplementation. He remains able to perform his ADLs independently.  However, he is no longer driving. He made this decision on his own. He says that if he needs to go anywhere he will have someone drive him. Overall, he feels that his condition is stable and has no complaints or concerns at this time. He is wanting to bring his ping pong table upstairs, as [facility]. He speaks about his brother visiting him recently and being happy to see him. Will continue to monitor.   HISTORY OF PRESENT ILLNESS:  This is a 85 yo male who resides in his home. He has a very  supportive family. Palliative care team continues to follow patient. Will continue to visit monthly and PRN.  CODE STATUS: Full code  ADVANCED DIRECTIVES: Y MOST FORM: no PPS: 50%   (Duration of visit and documentation 45 minutes)   Daryl Eastern, RN BSN

## 2019-02-13 ENCOUNTER — Encounter: Payer: Self-pay | Admitting: Internal Medicine

## 2019-02-14 ENCOUNTER — Other Ambulatory Visit: Payer: Self-pay

## 2019-02-14 MED ORDER — PROPAFENONE HCL 225 MG PO TABS: 225.0000 mg | ORAL_TABLET | Freq: Two times a day (BID) | ORAL | 3 refills | Status: DC

## 2019-02-14 MED ORDER — APIXABAN 5 MG PO TABS: 5.0000 mg | ORAL_TABLET | Freq: Two times a day (BID) | ORAL | 1 refills | Status: DC

## 2019-02-14 NOTE — Telephone Encounter (Signed)
Pt last saw Dr Lovena Le 12/29/18, last labs 11/13/18 Creat 1.13, age 85, weight 65.3kg, based on specified criteria pt is on appropriate dosage of Eliquis 5mg  BID.  Will refill rx.

## 2019-02-20 ENCOUNTER — Other Ambulatory Visit: Payer: Self-pay | Admitting: Neurology

## 2019-02-27 ENCOUNTER — Encounter: Payer: Self-pay | Admitting: Internal Medicine

## 2019-02-28 ENCOUNTER — Telehealth: Payer: Self-pay

## 2019-02-28 NOTE — Telephone Encounter (Signed)
Copied from [place] [phone]. Topic: Appointment Scheduling - Scheduling Inquiry for Clinic >> Feb 28, 2019 11:10 AM Berneta Levins wrote: Reason for CRM:   Pt's daughter calling. Pt has a cancer history and has has been intouch with pt's oncologist who recommends that PCP order xrays. Pt is having pain in middle of femur and humerus - going on for about 3 weeks.  This is right sided pain. Pt's daughter wants to bring pt in for xray only.

## 2019-02-28 NOTE — Telephone Encounter (Signed)
Please advise 

## 2019-03-07 ENCOUNTER — Ambulatory Visit: Payer: Self-pay

## 2019-03-07 ENCOUNTER — Telehealth: Payer: Self-pay

## 2019-03-07 NOTE — Telephone Encounter (Signed)
Pts daughter calling, pt present during call. States developed rash 1 week ago. 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 States rash under right breast, wraps around to back. Reports pain and itching initially, not presently. States areas are "Draining pus looking drainage."  Denies fever. Requesting antibiotic. Advised virtual appt. States able to do so. Assured encounter would be routed to practice for consideration of virtual appt, and Dr. Gwynn Burly review. States will see any provider available. Care advise given per protocol. Verbalizes understanding.   After hours call. .  Best CB # [phone]  Reason for Disposition . [1] Looks infected (spreading redness, pus) AND [2] no fever  Answer Assessment - Initial Assessment Questions 1. APPEARANCE of RASH: "Describe the rash."      Starts on right  chest, wraps around underneath arm to mid line back 2. LOCATION: "Where is the rash located?"       3. ONSET: "When did the rash start?"      1 week ago 4. ITCHING: "Does the rash itch?" If so, ask: "How bad is the itch?"  (Scale 1-10; or mild, moderate, severe)     no 5. PAIN: "Does the rash hurt?" If so, ask: "How bad is the pain?"  (Scale 1-10; or mild, moderate, severe)     mild 6. OTHER SYMPTOMS: "Do you have any other symptoms?" (e.g., fever)     Drainage, "Pus like"  Protocols used: [facility]

## 2019-03-07 NOTE — Telephone Encounter (Signed)
Received message from [facility] [employer] on 03/03/2019 that states the following: "Caller states her father is having pain under his arm pit. Caller states it maybe shingles.  Blood Pressure 96/30 - BP 89/53 on recheck."  Was advised to go to ED. Do not see an ED encounter.

## 2019-03-08 NOTE — Telephone Encounter (Signed)
I have talked with patient and patient's daughter ---patient's appt is tomorrow, not today----drainage is not infected so bad that it cant wait until tomorrow per patient's daughter, wants to keep appt tomorrow with dr Ardith Dark advised that if patient worsens before appt tomorrow, go to ED or urgent care

## 2019-03-08 NOTE — Telephone Encounter (Signed)
Duplicate note. Triage tele encounter routed to PCP.

## 2019-03-08 NOTE — Telephone Encounter (Signed)
Ok for virtual any provider

## 2019-03-08 NOTE — Telephone Encounter (Signed)
Unable to addend original tele encounter.  Pt states was waiting for virtual appt today and no call received. Please call pt at land line [phone] to advise what to do for pus drainage wounds shingles per prior note. Please advise if antibiotic or what to do.

## 2019-03-09 ENCOUNTER — Ambulatory Visit (INDEPENDENT_AMBULATORY_CARE_PROVIDER_SITE_OTHER): Payer: Medicare Other | Admitting: Internal Medicine

## 2019-03-09 ENCOUNTER — Encounter: Payer: Self-pay | Admitting: Internal Medicine

## 2019-03-09 DIAGNOSIS — R739 Hyperglycemia, unspecified: Secondary | ICD-10-CM

## 2019-03-09 DIAGNOSIS — J449 Chronic obstructive pulmonary disease, unspecified: Secondary | ICD-10-CM

## 2019-03-09 DIAGNOSIS — R21 Rash and other nonspecific skin eruption: Secondary | ICD-10-CM

## 2019-03-09 MED ORDER — VALACYCLOVIR HCL 1 G PO TABS: 1000.0000 mg | ORAL_TABLET | Freq: Three times a day (TID) | ORAL | 0 refills | Status: DC

## 2019-03-09 MED ORDER — HYDROCODONE-ACETAMINOPHEN 5-325 MG PO TABS: 1.0000 | ORAL_TABLET | Freq: Four times a day (QID) | ORAL | 0 refills | Status: DC | PRN

## 2019-03-09 NOTE — Telephone Encounter (Signed)
Virtual OV today with PCP at 2:00.

## 2019-03-09 NOTE — Progress Notes (Signed)
Patient ID: Raymond Logan, male   DOB: 1931-01-28, 86 y.o.   MRN: [id number]  Virtual Visit via Video Note - phone only due to technical difficulty  I connected with [name] on 03/09/19 at  2:00 PM EST by a video enabled telemedicine application and verified that I am speaking with the correct person using two identifiers.  Location: Patient: at home Provider: at office   I discussed the limitations of evaluation and management by telemedicine and the availability of in person appointments. The patient expressed understanding and agreed to proceed.  History of Present Illness: Here with family present with c/o rash from right shoulder blade around the right chest in what sounds like dermatomal grouped vesicles on erythem base. Quite painful 8/10   Pt denies chest pain, increased sob or doe, wheezing, orthopnea, PND, increased LE swelling, palpitations, dizziness or syncope.  Pt denies new neurological symptoms such as new headache, or facial or extremity weakness or numbness   Pt denies polydipsia, polyuria,  Past Medical History:  Diagnosis Date  . ALLERGIC RHINITIS 01/23/2007  . Arthritis    hands  . BENIGN PROSTATIC HYPERTROPHY 09/10/2006  . Chronic headaches   . Chronic sinusitis   . COPD, MILD 04/03/2010   patient denies on preop visit of 08/02/2014   . Difficulty in urination   . Dyspnea   . FATIGUE 01/08/2008  . GERD 04/15/2010  . H. pylori infection    hx of   . Headache(784.0) 09/10/2006  . HOARSENESS 04/15/2010  . HYPERLIPIDEMIA 09/10/2006  . INSOMNIA-SLEEP DISORDER-UNSPEC 04/24/2009  . Lung cancer ([place])   . PAF (paroxysmal atrial fibrillation) ([place])    identified on EKG 04/08/17  . PEPTIC ULCER DISEASE 01/23/2007  . Pneumonia    1955   Past Surgical History:  Procedure Laterality Date  . CATARACT EXTRACTION Left   . COLONOSCOPY    . CYSTOSCOPY N/A 10/07/2016   Procedure: CYSTOSCOPY;  Surgeon: Festus Aloe, MD;  Location: [place];  Service: Urology;  Laterality:  N/A;  . FUDUCIAL PLACEMENT Right 02/06/2018   Procedure: PLACEMENT OF FIDUCIAL RIGHT LOWER LOBE LUNG;  Surgeon: [name], MD;  Location: [place];  Service: Thoracic;  Laterality: Right;  . HEMORRHOID BANDING    . INSERTION OF SUPRAPUBIC CATHETER N/A 10/07/2016   Procedure: FOLEY  CATHETER PLACEMENT;  Surgeon: Festus Aloe, MD;  Location: [place];  Service: Urology;  Laterality: N/A;  . KNEE ARTHROSCOPY Left    torn meniscus  . LUMBAR LAMINECTOMY/DECOMPRESSION MICRODISCECTOMY Right 08/07/2014   Procedure: complete DECOMPRESSION LUMBAR LAMINECTOMY/MICRODISCECTOMY OF L4-L5 for spinal stenosis, DECOMPRESSION OF L3-L4;  Surgeon: Latanya Maudlin, MD;  [facility];  Service: Orthopedics;  Laterality: Right;  . NASAL SINUS SURGERY Left 07/04/2017   Procedure: LEFT ENDOSCOPIC SINUS SURGERY;  Surgeon: Jerrell Belfast, MD;  Location: [place];  Service: ENT;  Laterality: Left;  . RIGHT/LEFT HEART CATH AND CORONARY ANGIOGRAPHY N/A 06/21/2017   Procedure: RIGHT/LEFT HEART CATH AND CORONARY ANGIOGRAPHY;  Surgeon: Belva Crome, MD;  Location: [place] [facility];  Service: Cardiovascular;  Laterality: N/A;  . ROTATOR CUFF REPAIR Right   . SHOULDER ARTHROSCOPY     x3, L & R  . SINUS ENDO WITH FUSION N/A 07/04/2017   Procedure: SINUS ENDO WITH FUSION;  Surgeon: Jerrell Belfast, MD;  Location: [place];  Service: ENT;  Laterality: N/A;  . VIDEO ASSISTED THORACOSCOPY (VATS)/ LOBECTOMY Right 10/07/2016   Procedure: VIDEO ASSISTED THORACOSCOPY (VATS)/RIGHT MIDDLE LOBECTOMY;  Surgeon: [name],  MD;  Location: [place];  Service: Thoracic;  Laterality: Right;  [name] VIDEO BRONCHOSCOPY WITH ENDOBRONCHIAL NAVIGATION N/A 09/23/2016   Procedure: VIDEO BRONCHOSCOPY WITH ENDOBRONCHIAL NAVIGATION;  Surgeon: [name], MD;  Location: [place];  Service: Thoracic;  Laterality: N/A;  . VIDEO BRONCHOSCOPY WITH ENDOBRONCHIAL NAVIGATION N/A 02/06/2018   Procedure: VIDEO BRONCHOSCOPY WITH ENDOBRONCHIAL  NAVIGATION;  Surgeon: [name], MD;  Location: [place];  Service: Thoracic;  Laterality: N/A;  . VIDEO BRONCHOSCOPY WITH ENDOBRONCHIAL ULTRASOUND N/A 09/23/2016   Procedure: VIDEO BRONCHOSCOPY WITH ENDOBRONCHIAL ULTRASOUND;  Surgeon: [name], MD;  Location: [place];  Service: Thoracic;  Laterality: N/A;    reports that he quit smoking about 43 years ago. He has a 23.00 pack-year smoking history. He has never used smokeless tobacco. He reports current alcohol use. He reports that he does not use drugs. family history includes Alzheimer's disease in his mother and sister; Prostate cancer in his brother. Allergies  Allergen Reactions  . Alfuzosin Other (See Comments) and Hypertension    BP went crazy, dizzy, passing out   . Aricept [Donepezil Hcl] Other (See Comments)    Insomnia, headache  . Penicillins Hives and Other (See Comments)    A PCN REACTION WITH IMMEDIATE RASH, FACIAL/TONGUE/THROAT SWELLING, SOB, OR LIGHTHEADEDNESS WITH HYPOTENSION:  #  #  #  YES  #  #  #   Has patient had a PCN reaction causing severe rash involving mucus membranes or skin necrosis:No Has patient had a PCN reaction that required hospitalization:No Has patient had a PCN reaction occurring within the last 10 years:No If all of the above answers are "NO", then may proceed with Cephalosporin use.    Current Outpatient Medications on File Prior to Visit  Medication Sig Dispense Refill  . albuterol (PROVENTIL HFA;VENTOLIN HFA) 108 (90 Base) MCG/ACT inhaler Inhale 2 puffs into the lungs every 6 (six) hours as needed for wheezing or shortness of breath. 1 Inhaler 11  . apixaban (ELIQUIS) 5 MG TABS tablet Take 1 tablet (5 mg total) by mouth 2 (two) times daily. 180 tablet 1  . calcium carbonate (TUMS - DOSED IN MG ELEMENTAL CALCIUM) 500 MG chewable tablet Chew 1 tablet by mouth daily as needed for indigestion or heartburn.     . finasteride (PROSCAR) 5 MG tablet Take 5 mg by mouth at bedtime.     .  fluticasone (FLONASE) 50 MCG/ACT nasal spray Place 2 sprays into both nostrils daily as needed for allergies or rhinitis.   11  . furosemide (LASIX) 20 MG tablet TAKE 1 TABLET BY MOUTH EVERY DAY. 90 tablet 3  . meclizine (ANTIVERT) 12.5 MG tablet 1-2 tablets by mouth every 8 hrs as needed for vertigo 40 tablet 1  . ondansetron (ZOFRAN) 4 MG tablet Take 1 tablet (4 mg total) by mouth every 8 (eight) hours as needed for nausea or vomiting. 20 tablet 1  . propafenone (RYTHMOL) 225 MG tablet Take 1 tablet (225 mg total) by mouth 2 (two) times daily. 180 tablet 3  . rivastigmine (EXELON) 1.5 MG capsule TAKE 1 CAPSULE BY MOUTH 2 TIMES DAILY 50 capsule 0  . silodosin (RAPAFLO) 8 MG CAPS capsule Take 8 mg by mouth at bedtime.     [name] zolpidem (AMBIEN) 5 MG tablet Take 1 tablet (5 mg total) by mouth at bedtime as needed. for sleep 30 tablet 5   No current facility-administered medications on file prior to visit.    Observations/Objective: Alert, NAD, appropriate mood and  affect, resps normal, cn 2-12 intact, moves all 4s, no visible rash or swelling Lab Results  Component Value Date   WBC 5.1 11/13/2018   HGB 10.5 (L) 11/13/2018   HCT 31.5 (L) 11/13/2018   PLT 242 11/13/2018   GLUCOSE 128 (H) 11/13/2018   CHOL 169 03/18/2017   TRIG 122.0 03/18/2017   HDL 35.90 (L) 03/18/2017   LDLDIRECT 134.4 03/27/2010   LDLCALC 108 (H) 03/18/2017   ALT <6 11/13/2018   AST 9 (L) 11/13/2018   NA 141 11/13/2018   K 4.1 11/13/2018   CL 105 11/13/2018   CREATININE 1.13 11/13/2018   BUN 18 11/13/2018   CO2 31 11/13/2018   TSH 0.92 11/09/2017   PSA 1.91 03/18/2017   INR 1.10 02/06/2018   HGBA1C 4.9 04/12/2018   Assessment and Plan: See notes  Follow Up Instructions: See notes   I discussed the assessment and treatment plan with the patient. The patient was provided an opportunity to ask questions and all were answered. The patient agreed with the plan and demonstrated an understanding of the  instructions.   The patient was advised to call back or seek an in-person evaluation if the symptoms worsen or if the condition fails to improve as anticipated.  Cathlean Cower, MD

## 2019-03-10 ENCOUNTER — Encounter: Payer: Self-pay | Admitting: Internal Medicine

## 2019-03-10 DIAGNOSIS — R21 Rash and other nonspecific skin eruption: Secondary | ICD-10-CM | POA: Insufficient documentation

## 2019-03-10 NOTE — Assessment & Plan Note (Signed)
stable overall by history and exam, recent data reviewed with pt, and pt to continue medical treatment as before,  to f/u any worsening symptoms or concerns  

## 2019-03-10 NOTE — Assessment & Plan Note (Signed)
C/w likely shingles rash, for valtrex asd and hydrocodone prn,  to f/u any worsening symptoms or concerns

## 2019-03-10 NOTE — Patient Instructions (Signed)
Please take all new medication as prescribed  Please continue all other medications as before, and refills have been done if requested.  Please have the pharmacy call with any other refills you may need.  Please continue your efforts at being more active, low cholesterol diet, and weight control.  Please keep your appointments with your specialists as you may have planned    

## 2019-03-11 ENCOUNTER — Ambulatory Visit: Payer: Medicare Other | Attending: Internal Medicine

## 2019-03-11 DIAGNOSIS — Z23 Encounter for immunization: Secondary | ICD-10-CM | POA: Diagnosis not present

## 2019-03-11 NOTE — Progress Notes (Signed)
   Covid-19 Vaccination Clinic  Name:  Raymond Logan    MRN: [id number] DOB: [birth year]  03/11/2019  [name] was observed post Covid-19 immunization for 15 minutes without incidence. He was provided with Vaccine Information Sheet and instruction to access the V-Safe system.   [name] was instructed to call 911 with any severe reactions post vaccine: [name] Difficulty breathing  . Swelling of your face and throat  . A fast heartbeat  . A bad rash all over your body  . Dizziness and weakness    Immunizations Administered    Name Date Dose VIS Date Route   Pfizer COVID-19 Vaccine 03/11/2019  1:35 PM 0.3 mL 02/09/2019 Intramuscular   Manufacturer: [employer]   Lot: [id number]   [place]: [id number]

## 2019-03-15 NOTE — Telephone Encounter (Signed)
This would be better as an inperson OV, which I think he should be able to do, as he also wants an xray apparently

## 2019-03-20 ENCOUNTER — Other Ambulatory Visit: Payer: Self-pay | Admitting: Neurology

## 2019-03-26 ENCOUNTER — Other Ambulatory Visit: Payer: Medicare Other | Admitting: Licensed Clinical Social Worker

## 2019-03-26 DIAGNOSIS — Z515 Encounter for palliative care: Secondary | ICD-10-CM

## 2019-03-27 ENCOUNTER — Ambulatory Visit (INDEPENDENT_AMBULATORY_CARE_PROVIDER_SITE_OTHER): Payer: Medicare Other | Admitting: Internal Medicine

## 2019-03-27 ENCOUNTER — Encounter: Payer: Self-pay | Admitting: Internal Medicine

## 2019-03-27 ENCOUNTER — Other Ambulatory Visit: Payer: Self-pay

## 2019-03-27 DIAGNOSIS — R739 Hyperglycemia, unspecified: Secondary | ICD-10-CM

## 2019-03-27 DIAGNOSIS — B0229 Other postherpetic nervous system involvement: Secondary | ICD-10-CM

## 2019-03-27 DIAGNOSIS — J449 Chronic obstructive pulmonary disease, unspecified: Secondary | ICD-10-CM

## 2019-03-27 MED ORDER — GABAPENTIN 100 MG PO CAPS: 100.0000 mg | ORAL_CAPSULE | Freq: Three times a day (TID) | ORAL | 3 refills | Status: DC

## 2019-03-27 MED ORDER — HYDROCODONE-ACETAMINOPHEN 7.5-325 MG PO TABS: 1.0000 | ORAL_TABLET | Freq: Four times a day (QID) | ORAL | 0 refills | Status: DC | PRN

## 2019-03-27 NOTE — Assessment & Plan Note (Addendum)
Uncontrolled pain, for increased hydrocodone prn, and gabapentin 100 tid,  to f/u any worsening symptoms or concerns  I spent 30 minutes preparing to see the patient by review of recent labs, imaging and procedures, obtaining and reviewing separately obtained history, communicating with the patient and family or caregiver, ordering medications, tests or procedures, and documenting clinical information in the EHR including the differential Dx, treatment, and any further evaluation and other management of PHN, hyperglycemia, copd

## 2019-03-27 NOTE — Assessment & Plan Note (Signed)
stable overall by history and exam, recent data reviewed with pt, and pt to continue medical treatment as before,  to f/u any worsening symptoms or concerns  

## 2019-03-27 NOTE — Patient Instructions (Signed)
Please take all new medication as prescribed 

## 2019-03-27 NOTE — Progress Notes (Signed)
Patient ID: Raymond Logan, male   DOB: 1930/04/18, 86 y.o.   MRN: [id number]  Virtual Visit via Video Note  I connected with Raymond Logan on 03/27/19 at  2:00 PM EST by a video enabled telemedicine application and verified that I am speaking with the correct person using two identifiers.  Location: Patient: at home Provider: at office   I discussed the limitations of evaluation and management by telemedicine and the availability of in person appointments. The patient expressed understanding and agreed to proceed.  History of Present Illness: Here to f/u; overall doing ok,  Pt denies chest pain, increasing sob or doe, wheezing, orthopnea, PND, increased LE swelling, palpitations, dizziness or syncope.  Pt denies new neurological symptoms such as new headache, or facial or extremity weakness or numbness.  Pt denies polydipsia, polyuria,.  S/p recent right lateral chest shingles under the axilla with lesions scabbed and essentially healed, but pain persists, not well controlled with hydrocodone 5 325 alone.   Pt denies fever, wt loss, night sweats, loss of appetite, or other constitutional symptoms   Past Medical History:  Diagnosis Date  . ALLERGIC RHINITIS 01/23/2007  . Arthritis    hands  . BENIGN PROSTATIC HYPERTROPHY 09/10/2006  . Chronic headaches   . Chronic sinusitis   . COPD, MILD 04/03/2010   patient denies on preop visit of 08/02/2014   . Difficulty in urination   . Dyspnea   . FATIGUE 01/08/2008  . GERD 04/15/2010  . H. pylori infection    hx of   . Headache(784.0) 09/10/2006  . HOARSENESS 04/15/2010  . HYPERLIPIDEMIA 09/10/2006  . INSOMNIA-SLEEP DISORDER-UNSPEC 04/24/2009  . Lung cancer ([place])   . PAF (paroxysmal atrial fibrillation) ([place])    identified on EKG 04/08/17  . PEPTIC ULCER DISEASE 01/23/2007  . Pneumonia    1955   Past Surgical History:  Procedure Laterality Date  . CATARACT EXTRACTION Left   . COLONOSCOPY    . CYSTOSCOPY N/A 10/07/2016   Procedure: CYSTOSCOPY;   Surgeon: Festus Aloe, MD;  Location: [place];  Service: Urology;  Laterality: N/A;  . FUDUCIAL PLACEMENT Right 02/06/2018   Procedure: PLACEMENT OF FIDUCIAL RIGHT LOWER LOBE LUNG;  Surgeon: [name], MD;  Location: [place];  Service: Thoracic;  Laterality: Right;  . HEMORRHOID BANDING    . INSERTION OF SUPRAPUBIC CATHETER N/A 10/07/2016   Procedure: FOLEY  CATHETER PLACEMENT;  Surgeon: Festus Aloe, MD;  Location: [place];  Service: Urology;  Laterality: N/A;  . KNEE ARTHROSCOPY Left    torn meniscus  . LUMBAR LAMINECTOMY/DECOMPRESSION MICRODISCECTOMY Right 08/07/2014   Procedure: complete DECOMPRESSION LUMBAR LAMINECTOMY/MICRODISCECTOMY OF L4-L5 for spinal stenosis, DECOMPRESSION OF L3-L4;  Surgeon: Latanya Maudlin, MD;  [facility];  Service: Orthopedics;  Laterality: Right;  . NASAL SINUS SURGERY Left 07/04/2017   Procedure: LEFT ENDOSCOPIC SINUS SURGERY;  Surgeon: Jerrell Belfast, MD;  Location: [place];  Service: ENT;  Laterality: Left;  . RIGHT/LEFT HEART CATH AND CORONARY ANGIOGRAPHY N/A 06/21/2017   Procedure: RIGHT/LEFT HEART CATH AND CORONARY ANGIOGRAPHY;  Surgeon: Belva Crome, MD;  Location: [place] [facility];  Service: Cardiovascular;  Laterality: N/A;  . ROTATOR CUFF REPAIR Right   . SHOULDER ARTHROSCOPY     x3, L & R  . SINUS ENDO WITH FUSION N/A 07/04/2017   Procedure: SINUS ENDO WITH FUSION;  Surgeon: Jerrell Belfast, MD;  Location: [place];  Service: ENT;  Laterality: N/A;  . VIDEO ASSISTED THORACOSCOPY (VATS)/ LOBECTOMY Right 10/07/2016  Procedure: VIDEO ASSISTED THORACOSCOPY (VATS)/RIGHT MIDDLE LOBECTOMY;  Surgeon: [name], MD;  Location: [place];  Service: Thoracic;  Laterality: Right;  [name] VIDEO BRONCHOSCOPY WITH ENDOBRONCHIAL NAVIGATION N/A 09/23/2016   Procedure: VIDEO BRONCHOSCOPY WITH ENDOBRONCHIAL NAVIGATION;  Surgeon: [name], MD;  Location: [place];  Service: Thoracic;  Laterality: N/A;  . VIDEO BRONCHOSCOPY WITH ENDOBRONCHIAL  NAVIGATION N/A 02/06/2018   Procedure: VIDEO BRONCHOSCOPY WITH ENDOBRONCHIAL NAVIGATION;  Surgeon: [name], MD;  Location: [place];  Service: Thoracic;  Laterality: N/A;  . VIDEO BRONCHOSCOPY WITH ENDOBRONCHIAL ULTRASOUND N/A 09/23/2016   Procedure: VIDEO BRONCHOSCOPY WITH ENDOBRONCHIAL ULTRASOUND;  Surgeon: [name], MD;  Location: [place];  Service: Thoracic;  Laterality: N/A;    reports that he quit smoking about 43 years ago. He has a 23.00 pack-year smoking history. He has never used smokeless tobacco. He reports current alcohol use. He reports that he does not use drugs. family history includes Alzheimer's disease in his mother and sister; Prostate cancer in his brother. Allergies  Allergen Reactions  . Alfuzosin Other (See Comments) and Hypertension    BP went crazy, dizzy, passing out   . Aricept [Donepezil Hcl] Other (See Comments)    Insomnia, headache  . Penicillins Hives and Other (See Comments)    A PCN REACTION WITH IMMEDIATE RASH, FACIAL/TONGUE/THROAT SWELLING, SOB, OR LIGHTHEADEDNESS WITH HYPOTENSION:  #  #  #  YES  #  #  #   Has patient had a PCN reaction causing severe rash involving mucus membranes or skin necrosis:No Has patient had a PCN reaction that required hospitalization:No Has patient had a PCN reaction occurring within the last 10 years:No If all of the above answers are "NO", then may proceed with Cephalosporin use.    Current Outpatient Medications on File Prior to Visit  Medication Sig Dispense Refill  . albuterol (PROVENTIL HFA;VENTOLIN HFA) 108 (90 Base) MCG/ACT inhaler Inhale 2 puffs into the lungs every 6 (six) hours as needed for wheezing or shortness of breath. 1 Inhaler 11  . apixaban (ELIQUIS) 5 MG TABS tablet Take 1 tablet (5 mg total) by mouth 2 (two) times daily. 180 tablet 1  . calcium carbonate (TUMS - DOSED IN MG ELEMENTAL CALCIUM) 500 MG chewable tablet Chew 1 tablet by mouth daily as needed for indigestion or heartburn.      . finasteride (PROSCAR) 5 MG tablet Take 5 mg by mouth at bedtime.     . fluticasone (FLONASE) 50 MCG/ACT nasal spray Place 2 sprays into both nostrils daily as needed for allergies or rhinitis.   11  . furosemide (LASIX) 20 MG tablet TAKE 1 TABLET BY MOUTH EVERY DAY. 90 tablet 3  . meclizine (ANTIVERT) 12.5 MG tablet 1-2 tablets by mouth every 8 hrs as needed for vertigo 40 tablet 1  . ondansetron (ZOFRAN) 4 MG tablet Take 1 tablet (4 mg total) by mouth every 8 (eight) hours as needed for nausea or vomiting. 20 tablet 1  . propafenone (RYTHMOL) 225 MG tablet Take 1 tablet (225 mg total) by mouth 2 (two) times daily. 180 tablet 3  . rivastigmine (EXELON) 1.5 MG capsule TAKE 1 CAPSULE BY MOUTH 2 TIMES DAILY 180 capsule 0  . silodosin (RAPAFLO) 8 MG CAPS capsule Take 8 mg by mouth at bedtime.     [name] zolpidem (AMBIEN) 5 MG tablet Take 1 tablet (5 mg total) by mouth at bedtime as needed. for sleep 30 tablet 5   No current facility-administered medications on file  prior to visit.    Observations/Objective: Alert, NAD, appropriate mood and affect, resps normal, cn 2-12 intact, moves all 4s, no visible rash or swelling Lab Results  Component Value Date   WBC 5.1 11/13/2018   HGB 10.5 (L) 11/13/2018   HCT 31.5 (L) 11/13/2018   PLT 242 11/13/2018   GLUCOSE 128 (H) 11/13/2018   CHOL 169 03/18/2017   TRIG 122.0 03/18/2017   HDL 35.90 (L) 03/18/2017   LDLDIRECT 134.4 03/27/2010   LDLCALC 108 (H) 03/18/2017   ALT <6 11/13/2018   AST 9 (L) 11/13/2018   NA 141 11/13/2018   K 4.1 11/13/2018   CL 105 11/13/2018   CREATININE 1.13 11/13/2018   BUN 18 11/13/2018   CO2 31 11/13/2018   TSH 0.92 11/09/2017   PSA 1.91 03/18/2017   INR 1.10 02/06/2018   HGBA1C 4.9 04/12/2018   Assessment and Plan: See notes  Follow Up Instructions: See notes   I discussed the assessment and treatment plan with the patient. The patient was provided an opportunity to ask questions and all were answered. The  patient agreed with the plan and demonstrated an understanding of the instructions.   The patient was advised to call back or seek an in-person evaluation if the symptoms worsen or if the condition fails to improve as anticipated.  Cathlean Cower, MD

## 2019-03-28 NOTE — Progress Notes (Signed)
COMMUNITY PALLIATIVE CARE SW NOTE  PATIENT NAME: Raymond Logan DOB: Jul 22, 1930 MRN: [id number]  PRIMARY CARE PROVIDER: Biagio Borg, MD  RESPONSIBLE PARTY:  Acct ID - Guarantor Home Phone Work Phone Relationship Acct Type  [id number] Raymond Logan, Raymond [phone]  Self P/F     [address] [place], [place] [address]   Due to the COVID-19 crisis, this virtual check-in visit was done via telephone from my office and it was initiated and consent given by this patientand orfamily.  PLAN OF CARE and INTERVENTIONS:             1. GOALS OF CARE/ ADVANCE CARE PLANNING:  Goal is for patient to remain in his home and be as independent as possible.  He is a full code. 2. SOCIAL/EMOTIONAL/SPIRITUAL ASSESSMENT/ INTERVENTIONS:  SW conducted a virtual check-in visit with patient in his home.  He stated his right side was painful and thought it was shingles.  He said he has an MD appointment tomorrow.  Encouraged him to contact Palliative Care for a home visit as needed.  The next home visit is scheduled for Wed, February 3rd at 1pm.  His other daughter, Raymond Logan, is living with him again.  Her health has improved per patient, and she is assisting him in meal prep.  His dog continues to be a good companion. 3. PATIENT/CAREGIVER EDUCATION/ COPING:  Patient copes by being an optimist. 4. PERSONAL EMERGENCY PLAN:  Patient will contact his daughter for medical concerns. 5. COMMUNITY RESOURCES COORDINATION/ HEALTH CARE NAVIGATION:  None. 6. FINANCIAL/LEGAL CONCERNS/INTERVENTIONS:  None.     SOCIAL HX:  Social History   Tobacco Use  . Smoking status: Former Smoker    Packs/day: 1.00    Years: 23.00    Pack years: 23.00    Quit date: 1978    Years since quitting: 43.1  . Smokeless tobacco: Never Used  Substance Use Topics  . Alcohol use: Yes    Comment: rare    CODE STATUS:  Full Code  ADVANCED DIRECTIVES: HCPOA MOST FORM COMPLETE:  No HOSPICE EDUCATION PROVIDED: No PPS:  Patient reports a normal  appetite.  He independently ambulates. Duration of visit and documentation:  30 minutes.      Creola Corn Romesha Scherer, LCSW

## 2019-04-01 ENCOUNTER — Ambulatory Visit: Payer: Medicare Other | Attending: Internal Medicine

## 2019-04-01 ENCOUNTER — Ambulatory Visit: Payer: Self-pay

## 2019-04-01 DIAGNOSIS — Z23 Encounter for immunization: Secondary | ICD-10-CM

## 2019-04-01 NOTE — Progress Notes (Signed)
   Covid-19 Vaccination Clinic  Name:  Raymond Logan    MRN: [id number] DOB: 04-10-1930  04/01/2019  Raymond Logan was observed post Covid-19 immunization for 15 minutes without incidence. He was provided with Vaccine Information Sheet and instruction to access the V-Safe system.   Raymond Logan was instructed to call 911 with any severe reactions post vaccine: [name] Difficulty breathing  . Swelling of your face and throat  . A fast heartbeat  . A bad rash all over your body  . Dizziness and weakness    Immunizations Administered    Name Date Dose VIS Date Route   Pfizer COVID-19 Vaccine 04/01/2019 12:42 PM 0.3 mL 02/09/2019 Intramuscular   Manufacturer: [place]   Lot: [id number]   [place]: [id number]

## 2019-04-03 ENCOUNTER — Telehealth: Payer: Self-pay | Admitting: Internal Medicine

## 2019-04-03 MED ORDER — LIDOCAINE 5 % EX PTCH: 1.0000 | MEDICATED_PATCH | CUTANEOUS | 0 refills | Status: DC

## 2019-04-03 NOTE — Telephone Encounter (Signed)
Sorry, there is no lotion, but I did rx a lidoderm pain patch - done erx

## 2019-04-03 NOTE — Telephone Encounter (Signed)
Please advise 

## 2019-04-03 NOTE — Telephone Encounter (Signed)
Called daughter to inform her that prescription had been called into pharmacy

## 2019-04-03 NOTE — Telephone Encounter (Signed)
    Daughter calling to request lotion for patient to help with pain for shingles.  Please advise

## 2019-04-04 ENCOUNTER — Other Ambulatory Visit: Payer: Self-pay

## 2019-04-04 ENCOUNTER — Other Ambulatory Visit: Payer: Medicare Other | Admitting: Licensed Clinical Social Worker

## 2019-04-04 DIAGNOSIS — Z515 Encounter for palliative care: Secondary | ICD-10-CM

## 2019-04-05 ENCOUNTER — Telehealth: Payer: Self-pay

## 2019-04-05 NOTE — Telephone Encounter (Signed)
[place] for daughter to check with pharmacy regarding cost, as I suspect this is very expensive and would not a good use of my time to go round and round regarding a medication he will eventually not want to pay for

## 2019-04-05 NOTE — Telephone Encounter (Signed)
[name], patient's daughter, calling and states that the patient has been seeing commercials for Qutenza. It is a capsation patch and he is requesting this due to very painful shingles. Please advise. States that pharmacy can deliver between 3pm-6pm as long as an order if received by 1pm.  CB#: [phone]  [place], [place]

## 2019-04-05 NOTE — Progress Notes (Signed)
COMMUNITY PALLIATIVE CARE SW NOTE  PATIENT NAME: Raymond Logan DOB: 07-12-1930 MRN: [id number]  PRIMARY CARE PROVIDER: Biagio Borg, MD  RESPONSIBLE PARTY:  Acct ID - Guarantor Home Phone Work Phone Relationship Acct Type  [id number] [name]  Self P/F     [address], [place] [address]     PLAN OF CARE and INTERVENTIONS:             1. GOALS OF CARE/ ADVANCE CARE PLANNING:  Patient's goal is to remain as independent as possible and to remain in his home.  Patient is a full code. 2. SOCIAL/EMOTIONAL/SPIRITUAL ASSESSMENT/ INTERVENTIONS:  SW met with patient and his daughter, Raymond Logan, in his home.  Patient was in his recliner with his legs elevated.  He is usually sitting up.  Patient was recently diagnosed with shingles, which is resolving with medication.  He reports occasional nerve pain.  He continues to walk his dog daily.  Raymond Logan and patient appeared to respond positively to reminiscing.  SW provided active listening and supportive counseling.  Raymond Logan assists patient with meals and other household chores. 3. PATIENT/CAREGIVER EDUCATION/ COPING:  Patient and daughter, Raymond Logan, cope by problem-solving. 4. PERSONAL EMERGENCY PLAN:  Patient contacts his daughter, Raymond Logan, for medical concerns. 5. COMMUNITY RESOURCES COORDINATION/ HEALTH CARE NAVIGATION:  None. FINANCIAL/LEGAL CONCERNS/INTERVENTIONS:  None.    SOCIAL HX:  Social History   Tobacco Use  . Smoking status: Former Smoker    Packs/day: 1.00    Years: 23.00    Pack years: 23.00    Quit date: 1978    Years since quitting: 43.1  . Smokeless tobacco: Never Used  Substance Use Topics  . Alcohol use: Yes    Comment: rare    CODE STATUS:  Full Code ADVANCED DIRECTIVES:  HCPOA MOST FORM COMPLETE:  Previously presented to patient. HOSPICE EDUCATION PROVIDED:  No PPS:  Patient reports his appetite is normal.  He ambulates independently. Duration of visit and documentation:  60 minutes.      Creola Corn  Tavionna Grout, LCSW

## 2019-04-06 NOTE — Telephone Encounter (Signed)
Called pt daughter there was no answer LMOM w/MD response.Marland KitchenJohny Logan

## 2019-04-06 NOTE — Telephone Encounter (Signed)
I decline as I have never prescribed this previously and would need to know more about this new drug.  In the meantime, please no more messages unless they know the med is affordable

## 2019-04-06 NOTE — Telephone Encounter (Signed)
Patient's daughter calling and states that they would still like for the prescription to be sent in.  [place], [place]

## 2019-04-06 NOTE — Telephone Encounter (Signed)
Patient's daughter calling and states that she has checked on the price of this medication and they are okay with the paying the cost of this medication.  Since they are okay with the cost, would like to the medication to be sent, if possible.   [place], [place]

## 2019-04-08 ENCOUNTER — Ambulatory Visit: Payer: Medicare Other

## 2019-04-10 ENCOUNTER — Telehealth: Payer: Self-pay

## 2019-04-10 NOTE — Telephone Encounter (Signed)
Pt's daughter would like to know what could be called in to help her dad if the requested medication can not be sent in. Please advise.

## 2019-04-10 NOTE — Telephone Encounter (Signed)
I have just seen the note, and not aware of any request for more medication today beyond what has already been sent

## 2019-04-10 NOTE — Telephone Encounter (Signed)
Noted  

## 2019-04-10 NOTE — Telephone Encounter (Signed)
I have not prescribed this bofore, there appears to be a possible Prior Auth that will be needed, but more importantly, this apparently is only applied and used by healthcare professionals in person (which we do not do here)  As per [employer]   Qutenza Dosage Medically reviewed by [email or web address]. Last updated on Dec 19, 2018. Generic name: CAPSAICIN 179mg  in 179mg ;  Dosage form: patch Special Precautions . Only physicians or health care professionals under the close supervision of a physician are to administer Qutenza. . Use only nitrile gloves when handling Qutenza, and when cleaning capsaicin residue from the skin. Do not use latex gloves as they do not provide adequate protection. . Immediately after use, dispose of used and unused patches, Cleansing Gel and other treatment materials in accordance with the local biomedical waste procedures. . Use Qutenza only on dry, intact (unbroken) skin. [name] Apply the Qutenza patch within 2 hours of opening the pouch.  So I dont this is a good idea and is outside of my clinical scope of practice

## 2019-04-10 NOTE — Telephone Encounter (Signed)
F/u   The daughter called back to check on the medication that needs to be done today and deliver  to the home by 11 am.    [place]

## 2019-04-10 NOTE — Telephone Encounter (Signed)
I dont have any other to offer since he has already been rx hydrocodone, lidoderm and gabapentin  The only thing would be to increase the gabapentin to 200 tid or even 300 tid if this does not know him out

## 2019-04-11 ENCOUNTER — Telehealth: Payer: Self-pay

## 2019-04-11 DIAGNOSIS — B0229 Other postherpetic nervous system involvement: Secondary | ICD-10-CM

## 2019-04-11 MED ORDER — GABAPENTIN 300 MG PO CAPS: 300.0000 mg | ORAL_CAPSULE | Freq: Three times a day (TID) | ORAL | 3 refills | Status: DC

## 2019-04-11 NOTE — Telephone Encounter (Signed)
Spoke with pt daughter, she approved the Gabapentin 300 mg per PCP.

## 2019-04-12 ENCOUNTER — Ambulatory Visit (INDEPENDENT_AMBULATORY_CARE_PROVIDER_SITE_OTHER): Payer: Medicare Other | Admitting: Internal Medicine

## 2019-04-12 ENCOUNTER — Encounter: Payer: Self-pay | Admitting: Internal Medicine

## 2019-04-12 DIAGNOSIS — J449 Chronic obstructive pulmonary disease, unspecified: Secondary | ICD-10-CM | POA: Diagnosis not present

## 2019-04-12 DIAGNOSIS — B0229 Other postherpetic nervous system involvement: Secondary | ICD-10-CM

## 2019-04-12 DIAGNOSIS — R739 Hyperglycemia, unspecified: Secondary | ICD-10-CM | POA: Diagnosis not present

## 2019-04-12 MED ORDER — HYDROCODONE-ACETAMINOPHEN 10-325 MG PO TABS: 1.0000 | ORAL_TABLET | Freq: Four times a day (QID) | ORAL | 0 refills | Status: AC | PRN

## 2019-04-12 MED ORDER — GABAPENTIN 300 MG PO CAPS: 300.0000 mg | ORAL_CAPSULE | Freq: Three times a day (TID) | ORAL | 3 refills | Status: DC

## 2019-04-12 NOTE — Patient Instructions (Signed)
Please take all new medication as prescribed 

## 2019-04-12 NOTE — Assessment & Plan Note (Signed)
stable overall by history and exam, recent data reviewed with pt, and pt to continue medical treatment as before,  to f/u any worsening symptoms or concerns  

## 2019-04-12 NOTE — Assessment & Plan Note (Addendum)
Unocntrolled, for increased hydrocodone prn, increased gabapentin 300 tid, and lidoderm asd,  to f/u any worsening symptoms or concerns  I spent 30 minutes preparing to see the patient by review of recent labs, imaging and procedures, obtaining and reviewing separately obtained history, communicating with the patient and family or caregiver, ordering medications, tests or procedures, and documenting clinical information in the EHR including the differential Dx, treatment, and any further evaluation and other management of PHN, copd, hyperglycemia

## 2019-04-12 NOTE — Progress Notes (Signed)
Patient ID: ISAM UNREIN, male   DOB: 1930-05-23, 86 y.o.   MRN: [id number]  Virtual Visit via Video Note  I connected with [name] on 04/12/19 at  9:00 AM EST by a video enabled telemedicine application and verified that I am speaking with the correct person using two identifiers.  Location: Patient: at home Provider: at office   I discussed the limitations of evaluation and management by telemedicine and the availability of in person appointments. The patient expressed understanding and agreed to proceed.  History of Present Illness: Here with uncontrolled PHN pain, burning, mod to sever, constant for > 1 wk.  Pt denies chest pain, increased sob or doe, wheezing, orthopnea, PND, increased LE swelling, palpitations, dizziness or syncope.  Pt denies new neurological symptoms such as new headache, or facial or extremity weakness or numbness   Pt denies polydipsia, polyuria, Past Medical History:  Diagnosis Date  . ALLERGIC RHINITIS 01/23/2007  . Arthritis    hands  . BENIGN PROSTATIC HYPERTROPHY 09/10/2006  . Chronic headaches   . Chronic sinusitis   . COPD, MILD 04/03/2010   patient denies on preop visit of 08/02/2014   . Difficulty in urination   . Dyspnea   . FATIGUE 01/08/2008  . GERD 04/15/2010  . H. pylori infection    hx of   . Headache(784.0) 09/10/2006  . HOARSENESS 04/15/2010  . HYPERLIPIDEMIA 09/10/2006  . INSOMNIA-SLEEP DISORDER-UNSPEC 04/24/2009  . Lung cancer ([facility])   . PAF (paroxysmal atrial fibrillation) ([facility])    identified on EKG 04/08/17  . PEPTIC ULCER DISEASE 01/23/2007  . Pneumonia    1955   Past Surgical History:  Procedure Laterality Date  . CATARACT EXTRACTION Left   . COLONOSCOPY    . CYSTOSCOPY N/A 10/07/2016   Procedure: CYSTOSCOPY;  Surgeon: Festus Aloe, MD;  Location: [facility];  Service: Urology;  Laterality: N/A;  . FUDUCIAL PLACEMENT Right 02/06/2018   Procedure: PLACEMENT OF FIDUCIAL RIGHT LOWER LOBE LUNG;  Surgeon: [name], MD;   Location: [facility];  Service: Thoracic;  Laterality: Right;  . HEMORRHOID BANDING    . INSERTION OF SUPRAPUBIC CATHETER N/A 10/07/2016   Procedure: FOLEY  CATHETER PLACEMENT;  Surgeon: Festus Aloe, MD;  Location: [facility];  Service: Urology;  Laterality: N/A;  . KNEE ARTHROSCOPY Left    torn meniscus  . LUMBAR LAMINECTOMY/DECOMPRESSION MICRODISCECTOMY Right 08/07/2014   Procedure: complete DECOMPRESSION LUMBAR LAMINECTOMY/MICRODISCECTOMY OF L4-L5 for spinal stenosis, DECOMPRESSION OF L3-L4;  Surgeon: Latanya Maudlin, MD;  Location: [facility];  Service: Orthopedics;  Laterality: Right;  . NASAL SINUS SURGERY Left 07/04/2017   Procedure: LEFT ENDOSCOPIC SINUS SURGERY;  Surgeon: Jerrell Belfast, MD;  Location: [facility];  Service: ENT;  Laterality: Left;  . RIGHT/LEFT HEART CATH AND CORONARY ANGIOGRAPHY N/A 06/21/2017   Procedure: RIGHT/LEFT HEART CATH AND CORONARY ANGIOGRAPHY;  Surgeon: Belva Crome, MD;  Location: [facility];  Service: Cardiovascular;  Laterality: N/A;  . ROTATOR CUFF REPAIR Right   . SHOULDER ARTHROSCOPY     x3, L & R  . SINUS ENDO WITH FUSION N/A 07/04/2017   Procedure: SINUS ENDO WITH FUSION;  Surgeon: Jerrell Belfast, MD;  Location: [facility];  Service: ENT;  Laterality: N/A;  . VIDEO ASSISTED THORACOSCOPY (VATS)/ LOBECTOMY Right 10/07/2016   Procedure: VIDEO ASSISTED THORACOSCOPY (VATS)/RIGHT MIDDLE LOBECTOMY;  Surgeon: [name], MD;  Location: [facility];  Service: Thoracic;  Laterality: Right;  [name] VIDEO BRONCHOSCOPY WITH ENDOBRONCHIAL NAVIGATION N/A 09/23/2016   Procedure:  VIDEO BRONCHOSCOPY WITH ENDOBRONCHIAL NAVIGATION;  Surgeon: [name], MD;  Location: [facility];  Service: Thoracic;  Laterality: N/A;  . VIDEO BRONCHOSCOPY WITH ENDOBRONCHIAL NAVIGATION N/A 02/06/2018   Procedure: VIDEO BRONCHOSCOPY WITH ENDOBRONCHIAL NAVIGATION;  Surgeon: [name], MD;  Location: [facility];  Service: Thoracic;  Laterality: N/A;  . VIDEO BRONCHOSCOPY WITH ENDOBRONCHIAL  ULTRASOUND N/A 09/23/2016   Procedure: VIDEO BRONCHOSCOPY WITH ENDOBRONCHIAL ULTRASOUND;  Surgeon: [name], MD;  Location: [facility];  Service: Thoracic;  Laterality: N/A;    reports that he quit smoking about 43 years ago. He has a 23.00 pack-year smoking history. He has never used smokeless tobacco. He reports current alcohol use. He reports that he does not use drugs. family history includes Alzheimer's disease in his mother and sister; Prostate cancer in his brother. Allergies  Allergen Reactions  . Alfuzosin Other (See Comments) and Hypertension    BP went crazy, dizzy, passing out   . Aricept [Donepezil Hcl] Other (See Comments)    Insomnia, headache  . Penicillins Hives and Other (See Comments)    A PCN REACTION WITH IMMEDIATE RASH, FACIAL/TONGUE/THROAT SWELLING, SOB, OR LIGHTHEADEDNESS WITH HYPOTENSION:  #  #  #  YES  #  #  #   Has patient had a PCN reaction causing severe rash involving mucus membranes or skin necrosis:No Has patient had a PCN reaction that required hospitalization:No Has patient had a PCN reaction occurring within the last 10 years:No If all of the above answers are "NO", then may proceed with Cephalosporin use.    Current Outpatient Medications on File Prior to Visit  Medication Sig Dispense Refill  . albuterol (PROVENTIL HFA;VENTOLIN HFA) 108 (90 Base) MCG/ACT inhaler Inhale 2 puffs into the lungs every 6 (six) hours as needed for wheezing or shortness of breath. 1 Inhaler 11  . apixaban (ELIQUIS) 5 MG TABS tablet Take 1 tablet (5 mg total) by mouth 2 (two) times daily. 180 tablet 1  . calcium carbonate (TUMS - DOSED IN MG ELEMENTAL CALCIUM) 500 MG chewable tablet Chew 1 tablet by mouth daily as needed for indigestion or heartburn.     . finasteride (PROSCAR) 5 MG tablet Take 5 mg by mouth at bedtime.     . fluticasone (FLONASE) 50 MCG/ACT nasal spray Place 2 sprays into both nostrils daily as needed for allergies or rhinitis.   11  . furosemide (LASIX)  20 MG tablet TAKE 1 TABLET BY MOUTH EVERY DAY. 90 tablet 3  . lidocaine (LIDODERM) 5 % Place 1 patch onto the skin daily. Remove & Discard patch within 12 hours or as directed by MD 30 patch 0  . meclizine (ANTIVERT) 12.5 MG tablet 1-2 tablets by mouth every 8 hrs as needed for vertigo 40 tablet 1  . ondansetron (ZOFRAN) 4 MG tablet Take 1 tablet (4 mg total) by mouth every 8 (eight) hours as needed for nausea or vomiting. 20 tablet 1  . propafenone (RYTHMOL) 225 MG tablet Take 1 tablet (225 mg total) by mouth 2 (two) times daily. 180 tablet 3  . rivastigmine (EXELON) 1.5 MG capsule TAKE 1 CAPSULE BY MOUTH 2 TIMES DAILY 180 capsule 0  . silodosin (RAPAFLO) 8 MG CAPS capsule Take 8 mg by mouth at bedtime.     [name] zolpidem (AMBIEN) 5 MG tablet Take 1 tablet (5 mg total) by mouth at bedtime as needed. for sleep 30 tablet 5   No current facility-administered medications on file prior to visit.    Observations/Objective:  Alert, NAD, appropriate mood and affect, resps normal, cn 2-12 intact, moves all 4s, no visible rash or swelling Lab Results  Component Value Date   WBC 5.1 11/13/2018   HGB 10.5 (L) 11/13/2018   HCT 31.5 (L) 11/13/2018   PLT 242 11/13/2018   GLUCOSE 128 (H) 11/13/2018   CHOL 169 03/18/2017   TRIG 122.0 03/18/2017   HDL 35.90 (L) 03/18/2017   LDLDIRECT 134.4 03/27/2010   LDLCALC 108 (H) 03/18/2017   ALT <6 11/13/2018   AST 9 (L) 11/13/2018   NA 141 11/13/2018   K 4.1 11/13/2018   CL 105 11/13/2018   CREATININE 1.13 11/13/2018   BUN 18 11/13/2018   CO2 31 11/13/2018   TSH 0.92 11/09/2017   PSA 1.91 03/18/2017   INR 1.10 02/06/2018   HGBA1C 4.9 04/12/2018   Assessment and Plan: See notes  Follow Up Instructions: See notes   I discussed the assessment and treatment plan with the patient. The patient was provided an opportunity to ask questions and all were answered. The patient agreed with the plan and demonstrated an understanding of the instructions.   The  patient was advised to call back or seek an in-person evaluation if the symptoms worsen or if the condition fails to improve as anticipated.   Cathlean Cower, MD

## 2019-04-25 ENCOUNTER — Other Ambulatory Visit: Payer: Self-pay | Admitting: Neurology

## 2019-05-02 ENCOUNTER — Ambulatory Visit (INDEPENDENT_AMBULATORY_CARE_PROVIDER_SITE_OTHER): Payer: Medicare Other | Admitting: Neurology

## 2019-05-02 ENCOUNTER — Other Ambulatory Visit: Payer: Self-pay

## 2019-05-02 ENCOUNTER — Encounter: Payer: Self-pay | Admitting: Neurology

## 2019-05-02 VITALS — BP 91/64 | HR 96 | Temp 97.6°F | Ht 69.0 in | Wt 137.8 lb

## 2019-05-02 DIAGNOSIS — R413 Other amnesia: Secondary | ICD-10-CM | POA: Diagnosis not present

## 2019-05-02 MED ORDER — RIVASTIGMINE TARTRATE 1.5 MG PO CAPS: ORAL_CAPSULE | ORAL | 1 refills | Status: DC

## 2019-05-02 NOTE — Progress Notes (Signed)
PATIENT: Raymond Logan DOB: 12-22-1930  REASON FOR VISIT: follow up HISTORY FROM: patient  HISTORY OF PRESENT ILLNESS: Today 05/02/19  Raymond Logan is a 86 year old male with history of progressive memory disturbance.  He lives alone, his wife passed away in Mar 27, 2018.  For the last several months his daughter has been living with him, Raymond Logan.  He is still able to do a lot on his own.  He could drive a car, but he gave his car to his son.  He does his own laundry, and some housework.  He is able to prepare his meals, eats 3 meals a day, but small portions, drinks ensures.  He has been taken off Ambien, says he sleeps well.  He remains on low-dose Exelon.  He has been unable to tolerate Namenda due to mild delirium, Aricept caused headache and insomnia.  He takes several over-the-counter supplements, that his daughter feels has been helpful for memory.  His pills are in her pill dispenser, that alarms when it is time to take them.  He has had shingles recently.  He enjoys reading, and doing puzzles.  He denies any new problems or concerns.  He presents today accompanied by his daughter, Raymond Logan.  HISTORY 10/24/2018 SS: Raymond Logan is an 86 year old male with history of progressive memory disturbance.  He lives alone, his wife passed away in 2018-03-27.  He does have 2 daughters who live nearby in [place].  He is still able to operate a car.  He has been unable to tolerate Namenda as result of an episode of mild delirium.  He has been unable to tolerate Aricept because of headache and insomnia.  His last memory score was 27/30. At last visit he was started on low-dose Exelon.  His daughter, Raymond Logan is now staying with him.  He feels his memory has improved since last visit.  He has been taking several over-the-counter supplements (mentioned: lion's mane, [place] tail, mushrooms).  He manages his finances, with assistance from his other daughter who is also the POA.  He thinks he would be able to  manage his finances independently.  He does still drive a car without difficulty.  He has not had any falls.  He walks 3000 steps a day.  He reports that his appetite is fair.  His daughter reports his pills in a pill organizer.  Since last visit he has lost about 10 pounds.  He is able to perform all of his own ADLs.  He presents today for follow-up via virtual visit.   REVIEW OF SYSTEMS: Out of a complete 14 system review of symptoms, the patient complains only of the following symptoms, and all other reviewed systems are negative.  Memory loss  ALLERGIES: Allergies  Allergen Reactions  . Alfuzosin Other (See Comments) and Hypertension    BP went crazy, dizzy, passing out   . Aricept [Donepezil Hcl] Other (See Comments)    Insomnia, headache  . Penicillins Hives and Other (See Comments)    A PCN REACTION WITH IMMEDIATE RASH, FACIAL/TONGUE/THROAT SWELLING, SOB, OR LIGHTHEADEDNESS WITH HYPOTENSION:  #  #  #  YES  #  #  #   Has patient had a PCN reaction causing severe rash involving mucus membranes or skin necrosis:No Has patient had a PCN reaction that required hospitalization:No Has patient had a PCN reaction occurring within the last 10 years:No If all of the above answers are "NO", then may proceed with Cephalosporin use.     HOME  MEDICATIONS: Outpatient Medications Prior to Visit  Medication Sig Dispense Refill  . albuterol (PROVENTIL HFA;VENTOLIN HFA) 108 (90 Base) MCG/ACT inhaler Inhale 2 puffs into the lungs every 6 (six) hours as needed for wheezing or shortness of breath. 1 Inhaler 11  . apixaban (ELIQUIS) 5 MG TABS tablet Take 1 tablet (5 mg total) by mouth 2 (two) times daily. 180 tablet 1  . calcium carbonate (TUMS - DOSED IN MG ELEMENTAL CALCIUM) 500 MG chewable tablet Chew 1 tablet by mouth daily as needed for indigestion or heartburn.     . finasteride (PROSCAR) 5 MG tablet Take 5 mg by mouth at bedtime.     . furosemide (LASIX) 20 MG tablet TAKE 1 TABLET BY MOUTH EVERY  DAY. 90 tablet 3  . gabapentin (NEURONTIN) 300 MG capsule Take 1 capsule (300 mg total) by mouth 3 (three) times daily. 90 capsule 3  . lidocaine (LIDODERM) 5 % Place 1 patch onto the skin daily. Remove & Discard patch within 12 hours or as directed by MD 30 patch 0  . propafenone (RYTHMOL) 225 MG tablet Take 1 tablet (225 mg total) by mouth 2 (two) times daily. 180 tablet 3  . rivastigmine (EXELON) 1.5 MG capsule TAKE 1 CAPSULE BY MOUTH 2 TIMES DAILY 180 capsule 0  . silodosin (RAPAFLO) 8 MG CAPS capsule Take 8 mg by mouth at bedtime.     [name] zolpidem (AMBIEN) 5 MG tablet Take 1 tablet (5 mg total) by mouth at bedtime as needed. for sleep 30 tablet 5  . fluticasone (FLONASE) 50 MCG/ACT nasal spray Place 2 sprays into both nostrils daily as needed for allergies or rhinitis.   11  . meclizine (ANTIVERT) 12.5 MG tablet 1-2 tablets by mouth every 8 hrs as needed for vertigo 40 tablet 1  . ondansetron (ZOFRAN) 4 MG tablet Take 1 tablet (4 mg total) by mouth every 8 (eight) hours as needed for nausea or vomiting. 20 tablet 1   No facility-administered medications prior to visit.    PAST MEDICAL HISTORY: Past Medical History:  Diagnosis Date  . ALLERGIC RHINITIS 01/23/2007  . Arthritis    hands  . BENIGN PROSTATIC HYPERTROPHY 09/10/2006  . Chronic headaches   . Chronic sinusitis   . COPD, MILD 04/03/2010   patient denies on preop visit of 08/02/2014   . Difficulty in urination   . Dyspnea   . FATIGUE 01/08/2008  . GERD 04/15/2010  . H. pylori infection    hx of   . Headache(784.0) 09/10/2006  . HOARSENESS 04/15/2010  . HYPERLIPIDEMIA 09/10/2006  . INSOMNIA-SLEEP DISORDER-UNSPEC 04/24/2009  . Lung cancer ([place])   . PAF (paroxysmal atrial fibrillation) ([facility])    identified on EKG 04/08/17  . PEPTIC ULCER DISEASE 01/23/2007  . Pneumonia    1955    PAST SURGICAL HISTORY: Past Surgical History:  Procedure Laterality Date  . CATARACT EXTRACTION Left   . COLONOSCOPY    . CYSTOSCOPY N/A 10/07/2016    Procedure: CYSTOSCOPY;  Surgeon: Festus Aloe, MD;  Location: [facility];  Service: Urology;  Laterality: N/A;  . FUDUCIAL PLACEMENT Right 02/06/2018   Procedure: PLACEMENT OF FIDUCIAL RIGHT LOWER LOBE LUNG;  Surgeon: [name], MD;  Location: [facility];  Service: Thoracic;  Laterality: Right;  . HEMORRHOID BANDING    . INSERTION OF SUPRAPUBIC CATHETER N/A 10/07/2016   Procedure: FOLEY  CATHETER PLACEMENT;  Surgeon: Festus Aloe, MD;  Location: [facility];  Service: Urology;  Laterality: N/A;  . KNEE ARTHROSCOPY Left  torn meniscus  . LUMBAR LAMINECTOMY/DECOMPRESSION MICRODISCECTOMY Right 08/07/2014   Procedure: complete DECOMPRESSION LUMBAR LAMINECTOMY/MICRODISCECTOMY OF L4-L5 for spinal stenosis, DECOMPRESSION OF L3-L4;  Surgeon: Latanya Maudlin, MD;  Location: [facility];  Service: Orthopedics;  Laterality: Right;  . NASAL SINUS SURGERY Left 07/04/2017   Procedure: LEFT ENDOSCOPIC SINUS SURGERY;  Surgeon: Jerrell Belfast, MD;  Location: [facility];  Service: ENT;  Laterality: Left;  . RIGHT/LEFT HEART CATH AND CORONARY ANGIOGRAPHY N/A 06/21/2017   Procedure: RIGHT/LEFT HEART CATH AND CORONARY ANGIOGRAPHY;  Surgeon: Belva Crome, MD;  Location: [facility];  Service: Cardiovascular;  Laterality: N/A;  . ROTATOR CUFF REPAIR Right   . SHOULDER ARTHROSCOPY     x3, L & R  . SINUS ENDO WITH FUSION N/A 07/04/2017   Procedure: SINUS ENDO WITH FUSION;  Surgeon: Jerrell Belfast, MD;  Location: [facility];  Service: ENT;  Laterality: N/A;  . VIDEO ASSISTED THORACOSCOPY (VATS)/ LOBECTOMY Right 10/07/2016   Procedure: VIDEO ASSISTED THORACOSCOPY (VATS)/RIGHT MIDDLE LOBECTOMY;  Surgeon: [name], MD;  Location: [facility];  Service: Thoracic;  Laterality: Right;  [name] VIDEO BRONCHOSCOPY WITH ENDOBRONCHIAL NAVIGATION N/A 09/23/2016   Procedure: VIDEO BRONCHOSCOPY WITH ENDOBRONCHIAL NAVIGATION;  Surgeon: [name], MD;  Location: [facility];  Service: Thoracic;  Laterality: N/A;  . VIDEO BRONCHOSCOPY  WITH ENDOBRONCHIAL NAVIGATION N/A 02/06/2018   Procedure: VIDEO BRONCHOSCOPY WITH ENDOBRONCHIAL NAVIGATION;  Surgeon: [name], MD;  Location[facility];  Service: Thoracic;  Laterality: N/A;  . VIDEO BRONCHOSCOPY WITH ENDOBRONCHIAL ULTRASOUND N/A 09/23/2016   Procedure: VIDEO BRONCHOSCOPY WITH ENDOBRONCHIAL ULTRASOUND;  Surgeon: [name], MD;  Location: [facility];  Service: Thoracic;  Laterality: N/A;    FAMILY HISTORY: Family History  Problem Relation Age of Onset  . Prostate cancer Brother   . Alzheimer's disease Mother   . Alzheimer's disease Sister     SOCIAL HISTORY: Social History   Socioeconomic History  . Marital status: Widowed    Spouse name: Not on file  . Number of children: 3  . Years of education: Not on file  . Highest education level: Not on file  Occupational History  . Occupation: Retired    Fish farm manager: RETIRED  Tobacco Use  . Smoking status: Former Smoker    Packs/day: 1.00    Years: 23.00    Pack years: 23.00    Quit date: 1978    Years since quitting: 43.1  . Smokeless tobacco: Never Used  Substance and Sexual Activity  . Alcohol use: Yes    Comment: rare  . Drug use: No  . Sexual activity: Not on file  Other Topics Concern  . Not on file  Social History Narrative  . Not on file   Social Determinants of Health   Financial Resource Strain:   . Difficulty of Paying Living Expenses: Not on file  Food Insecurity:   . Worried About Charity fundraiser in the Last Year: Not on file  . Ran Out of Food in the Last Year: Not on file  Transportation Needs:   . Lack of Transportation (Medical): Not on file  . Lack of Transportation (Non-Medical): Not on file  Physical Activity:   . Days of Exercise per Week: Not on file  . Minutes of Exercise per Session: Not on file  Stress:   . Feeling of Stress : Not on file  Social Connections:   . Frequency of Communication with Friends and Family: Not on file  . Frequency of Social Gatherings  with Friends and Family: Not on file  . Attends Religious Services: Not on file  . Active Member of Clubs or Organizations: Not on file  . Attends Archivist Meetings: Not on file  . Marital Status: Not on file  Intimate Partner Violence:   . Fear of Current or Ex-Partner: Not on file  . Emotionally Abused: Not on file  . Physically Abused: Not on file  . Sexually Abused: Not on file   PHYSICAL EXAM  Vitals:   05/02/19 1347  BP: 91/64  Pulse: 96  Temp: 97.6 F (36.4 C)  TempSrc: Oral  Weight: 137 lb 12.8 oz (62.5 kg)  Height: 5\' 9"  (1.753 m)   Body mass index is 20.35 kg/m.  Generalized: Well developed, in no acute distress  MMSE - Mini Mental State Exam 05/02/2019 04/25/2018 10/03/2017  Not completed: (No Data) - -  Orientation to time 2 3 5   Orientation to Place 4 5 5   Registration 3 3 3   Attention/ Calculation 5 5 2   Recall 2 2 1   Language- name 2 objects 2 2 2   Language- repeat 1 1 1   Language- follow 3 step command 3 3 3   Language- read & follow direction 1 1 1   Write a sentence 1 1 1   Copy design 1 1 1   Total score 25 27 25     Neurological examination  Mentation: Alert oriented to time, place, history taking. Follows all commands speech and language fluent, voice is somewhat hoarse, manual BP 95/60 Cranial nerve II-XII: Pupils were equal round reactive to light. Extraocular movements were full, visual field were full on confrontational test. Facial sensation and strength were normal. Head turning and shoulder shrug were normal and symmetric. Motor: Good strength of all extremities Sensory: Sensory testing is intact to soft touch on all 4 extremities. No evidence of extinction is noted.  Coordination: Cerebellar testing reveals good finger-nose-finger and heel-to-shin bilaterally.  Gait and station: Able to rise from seated position without pushoff, gait is steady, good stance, no assistive device Reflexes: Deep tendon reflexes are symmetric   DIAGNOSTIC  DATA (LABS, IMAGING, TESTING) - I reviewed patient records, labs, notes, testing and imaging myself where available.  Lab Results  Component Value Date   WBC 5.1 11/13/2018   HGB 10.5 (L) 11/13/2018   HCT 31.5 (L) 11/13/2018   MCV 89.7 11/13/2018   PLT 242 11/13/2018      Component Value Date/Time   NA 141 11/13/2018 1355   NA 139 08/02/2017 1242   NA 142 11/04/2016 1334   K 4.1 11/13/2018 1355   K 4.0 11/04/2016 1334   CL 105 11/13/2018 1355   CO2 31 11/13/2018 1355   CO2 26 11/04/2016 1334   GLUCOSE 128 (H) 11/13/2018 1355   GLUCOSE 110 11/04/2016 1334   BUN 18 11/13/2018 1355   BUN 15 08/02/2017 1242   BUN 15.4 11/04/2016 1334   CREATININE 1.13 11/13/2018 1355   CREATININE 1.2 11/04/2016 1334   CALCIUM 8.7 (L) 11/13/2018 1355   CALCIUM 9.3 11/04/2016 1334   PROT 6.7 11/13/2018 1355   PROT 6.8 11/04/2016 1334   ALBUMIN 3.1 (L) 11/13/2018 1355   ALBUMIN 2.9 (L) 11/04/2016 1334   AST 9 (L) 11/13/2018 1355   AST 14 11/04/2016 1334   ALT <6 11/13/2018 1355   ALT 10 11/04/2016 1334   ALKPHOS 89 11/13/2018 1355   ALKPHOS 91 11/04/2016 1334   BILITOT 0.2 (L) 11/13/2018 1355   BILITOT 0.37 11/04/2016 1334  GFRNONAA 59 (L) 11/13/2018 1355   GFRAA >60 11/13/2018 1355   Lab Results  Component Value Date   CHOL 169 03/18/2017   HDL 35.90 (L) 03/18/2017   LDLCALC 108 (H) 03/18/2017   LDLDIRECT 134.4 03/27/2010   TRIG 122.0 03/18/2017   CHOLHDL 5 03/18/2017   Lab Results  Component Value Date   HGBA1C 4.9 04/12/2018   Lab Results  Component Value Date   VITAMINB12 512 12/15/2016   Lab Results  Component Value Date   TSH 0.92 11/09/2017      ASSESSMENT AND PLAN 86 y.o. year old male  has a past medical history of ALLERGIC RHINITIS (01/23/2007), Arthritis, BENIGN PROSTATIC HYPERTROPHY (09/10/2006), Chronic headaches, Chronic sinusitis, COPD, MILD (04/03/2010), Difficulty in urination, Dyspnea, FATIGUE (01/08/2008), GERD (04/15/2010), H. pylori infection,  Headache(784.0) (09/10/2006), HOARSENESS (04/15/2010), HYPERLIPIDEMIA (09/10/2006), INSOMNIA-SLEEP DISORDER-UNSPEC (04/24/2009), Lung cancer ([place]), PAF (paroxysmal atrial fibrillation) ([place]), PEPTIC ULCER DISEASE (01/23/2007), and Pneumonia. here with:  1.  Memory disturbance  His memory score remains relatively stable, 25/30, and he is still able to do a lot for himself.  In the last year, he has lost about 15 pounds.  He remains on very low-dose Exelon 1.5 mg twice daily.  We considered discontinuing the medication, but since he cannot tolerate Aricept or Namenda, he wanted to stay on the medication to hopefully benefit his memory. They are to closely monitor the weight, we may have to discontinue in the future.  He says he is no longer taking Ambien for sleep, but is sleeping well.  In the future, if needed we may consider Remeron.  He will follow-up in 6 months or sooner if needed.  I spent 15 minutes with the patient. 50% of this time was spent discussing his plan of care.  Butler Denmark, AGNP-C, DNP 05/02/2019, 1:49 PM [facility] [address] [place], [place] [address] [phone]

## 2019-05-02 NOTE — Patient Instructions (Signed)
It was good to see you today  We will continue the Exelon, but please keep check on your weight  See you back in 6 months

## 2019-05-03 NOTE — Progress Notes (Signed)
I have read the note, and I agree with the clinical assessment and plan.  Raiford Fetterman K Kasey Hansell   

## 2019-05-09 ENCOUNTER — Other Ambulatory Visit: Payer: Self-pay

## 2019-05-09 ENCOUNTER — Other Ambulatory Visit: Payer: Medicare Other | Admitting: *Deleted

## 2019-05-09 DIAGNOSIS — Z515 Encounter for palliative care: Secondary | ICD-10-CM

## 2019-05-10 DIAGNOSIS — M25519 Pain in unspecified shoulder: Secondary | ICD-10-CM | POA: Insufficient documentation

## 2019-05-10 DIAGNOSIS — M7061 Trochanteric bursitis, right hip: Secondary | ICD-10-CM | POA: Diagnosis not present

## 2019-05-10 DIAGNOSIS — M25511 Pain in right shoulder: Secondary | ICD-10-CM | POA: Diagnosis not present

## 2019-05-14 NOTE — Progress Notes (Signed)
COMMUNITY PALLIATIVE CARE RN NOTE  PATIENT NAME: Raymond Logan DOB: 1930-09-28 MRN: [id number]  PRIMARY CARE PROVIDER: Biagio Borg, MD  RESPONSIBLE PARTY:  Acct ID - Guarantor Home Phone Work Phone Relationship Acct Type  [id number] KEYSHUN, [phone]  Self P/F     [address] [place], [place] [address]   Due to the COVID-19 crisis, this virtual check-in visit was done via telephone from my office and it was initiated and consent by this patient and or family.  PLAN OF CARE and INTERVENTION:  1. ADVANCE CARE PLANNING/GOALS OF CARE: Goal is for patient to remain in his home.  2. PATIENT/CAREGIVER EDUCATION: Symptom management, safe mobility 3. DISEASE STATUS: Virtual check-in visit completed via telephone. Patient reports feeling "pretty good." He advised that he does have some pain under his right arm and right side of his chest and back d/t Shingles. He says it has cleared up, but the pain is still present. He has been prescribed Hydrocodone, Lidoderm patches and Gabapentin increased to 300 mg TID to help. He denies any shortness of breath at this time. It may occur occasionally after walking long distances. He is coughing much less. He does occasionally use cough drops when needed. He remains independent with all ADLs. He is ambulatory without the use of assistive devices. He takes his dog, Raymond Logan, out for a walk about 3 times/day. He is able to answer questions appropriately, but does make several repetitive statements. He does get confused at times. He continues on a Exelon patch. He does have meals at home that are easy to prepare and able to do his own laundry. He is drinking Ensure for nutritional supplementation. He feels that he is maintaining his weight.  Denies dysphagia. He reports that his younger daughter, Raymond Logan, is no longer living with him. His daughter, Raymond Logan continues to purchase patient's groceries, fill his pill box and checks on him regularly. Will continue to  monitor.  HISTORY OF PRESENT ILLNESS:  This is a 86 yo male who resides at home alone. He has a very supportive family. Palliative care continues to follow patient and visits monthly and PRN.  CODE STATUS: Full code ADVANCED DIRECTIVES: Y MOST FORM: no PPS: 60%   (Duration of visit and documentation 30 minutes)   Daryl Eastern, RN BSN

## 2019-05-17 ENCOUNTER — Telehealth: Payer: Self-pay | Admitting: *Deleted

## 2019-05-17 NOTE — Telephone Encounter (Signed)
CALLED PATIENT TO ASK ABOUT COMING IN FOR LABS ON 05-25-19 @ 1:45 PM, SPOKE WITH PATIENT AND HE AGREED TO DO THIS

## 2019-05-21 ENCOUNTER — Ambulatory Visit: Payer: Self-pay | Admitting: Radiation Oncology

## 2019-05-21 ENCOUNTER — Ambulatory Visit: Payer: Medicare Other | Admitting: Radiation Oncology

## 2019-05-25 ENCOUNTER — Other Ambulatory Visit: Payer: Self-pay

## 2019-05-25 ENCOUNTER — Ambulatory Visit (HOSPITAL_COMMUNITY)
Admission: RE | Admit: 2019-05-25 | Discharge: 2019-05-25 | Disposition: A | Discharge status: Home / Self Care | Payer: Medicare Other | Source: Ambulatory Visit | Attending: Radiation Oncology | Admitting: Radiation Oncology

## 2019-05-25 ENCOUNTER — Ambulatory Visit
Admission: RE | Admit: 2019-05-25 | Discharge: 2019-05-25 | Disposition: A | Discharge status: Home / Self Care | Payer: Medicare Other | Source: Ambulatory Visit | Attending: Radiation Oncology | Admitting: Radiation Oncology

## 2019-05-25 ENCOUNTER — Encounter (HOSPITAL_COMMUNITY): Payer: Self-pay

## 2019-05-25 DIAGNOSIS — C3431 Malignant neoplasm of lower lobe, right bronchus or lung: Secondary | ICD-10-CM | POA: Diagnosis not present

## 2019-05-25 DIAGNOSIS — C342 Malignant neoplasm of middle lobe, bronchus or lung: Secondary | ICD-10-CM | POA: Insufficient documentation

## 2019-05-25 DIAGNOSIS — J90 Pleural effusion, not elsewhere classified: Secondary | ICD-10-CM | POA: Diagnosis not present

## 2019-05-25 LAB — BUN & CREATININE (CHCC)
BUN: 19 mg/dL (ref 8–23)
Creatinine: 1.13 mg/dL (ref 0.61–1.24)
GFR, Est AFR Am: >60 mL/min (ref >60)
GFR, Estimated: 59 mL/min — ABNORMAL LOW (ref >60)

## 2019-05-25 MED ORDER — IOHEXOL 300 MG/ML  SOLN
75.0000 mL | Freq: Once | INTRAMUSCULAR | Status: AC | PRN
  Administered 2019-05-25: 75 mL via INTRAVENOUS

## 2019-05-25 MED ORDER — SODIUM CHLORIDE (PF) 0.9 % IJ SOLN
INTRAMUSCULAR | Status: AC
  Filled 2019-05-25: qty 50

## 2019-05-28 ENCOUNTER — Encounter: Payer: Self-pay | Admitting: Radiation Oncology

## 2019-05-28 ENCOUNTER — Ambulatory Visit
Admission: RE | Admit: 2019-05-28 | Discharge: 2019-05-28 | Disposition: A | Discharge status: Home / Self Care | Payer: Medicare Other | Source: Ambulatory Visit | Attending: Radiation Oncology | Admitting: Radiation Oncology

## 2019-05-28 ENCOUNTER — Other Ambulatory Visit: Payer: Self-pay

## 2019-05-28 DIAGNOSIS — Z87891 Personal history of nicotine dependence: Secondary | ICD-10-CM | POA: Diagnosis not present

## 2019-05-28 DIAGNOSIS — Z85118 Personal history of other malignant neoplasm of bronchus and lung: Secondary | ICD-10-CM | POA: Diagnosis not present

## 2019-05-28 DIAGNOSIS — B0223 Postherpetic polyneuropathy: Secondary | ICD-10-CM | POA: Diagnosis not present

## 2019-05-28 DIAGNOSIS — Z08 Encounter for follow-up examination after completed treatment for malignant neoplasm: Secondary | ICD-10-CM | POA: Diagnosis not present

## 2019-05-28 DIAGNOSIS — C342 Malignant neoplasm of middle lobe, bronchus or lung: Secondary | ICD-10-CM

## 2019-05-28 NOTE — Progress Notes (Signed)
Radiation Oncology         [phone] ________________________________  Outpatient Follow Up- Conducted via telephone due to current COVID-19 concerns for limiting patient exposure  I spoke with the patient to conduct this consult visit via telephone to spare the patient unnecessary potential exposure in the healthcare setting during the current COVID-19 pandemic. The patient was notified in advance and was offered a [place] meeting to allow for face to face communication but unfortunately reported that they did not have the appropriate resources/technology to support such a visit and instead preferred to proceed with a telephone visit.    Name: Raymond Logan MRN: [id number]  Date of Service: 05/28/2019 DOB: 04-03-1930  CC: Raymond Borg, MD  Raymond Borg, MD  Diagnosis:   Stage IB, pT1cN0, NSCLC, adenocarcinoma of the right middle lobe of the lung with new finding of RLL lesion.  Interval Since Last Radiation: 2 years and 2 months.   02/24/18-03/09/2018 SBRT Treatment: The RLL target was treated to 50 Gy in 5 fractions  11/18/2016 - 12/30/2016: The patient was treated to the right middle lobe surgical bed to a dose of 60 Gy in 30 fractions using a 3 field, 3-D conformal technique.   Narrative:  [name] is a pleasant 86 y.o. gentleman who was treated with radiotherapy for a positive margin following surgical resection of a stage I lung cancer in 2018. He tolerated radiation with the exception of "mental fogginess." After reviewing his case with his daughter who's a physical therapist, the family had noticed increasing mental confusion and decline. He did undergo a brain MRI to rule out metastatic disease which was negative for metastases on  12/22/16. He is followed by Dr. Jannifer Franklin in Neurology. He has been followed in surveillance since completing radiotherapy. A CT scan in February 2019 revealed a new 6 mm nodule in the right lower lobe, and a 5.7 mm nodule in the posterior superior  aspect of the right major fissure.  There is an enlarged AP window lymph node and a 15 mm subcarinal lymph node both new since the prior study, he underwent repeat CT scan on 08/30/2017 which revealed interval growth of a sub-solid 1.7 cm right lower lobe nodule and perihilar radiation fibrosis.  A small dependent right pleural effusion was again noted, and no adenopathy was noted.  PET/CT on 10/04/2017 revealed an SUV max of 1.6 in the right lower lobe lesion, post surgical and post radiotherapy changes within the right aspect of the lung is fissure, there is a 4 mm nodule in the left lung at the oblique fissure, and increased hypermetabolic activity in the small left hilar lymph node with an SUV of 7.5, previously in 2018 it was 4.9.  CT imaging on 12/09/2017 and slight interval enlargement of the right lower lobe nodule was identified measuring 23.5 x 13.5 mm, and he subsequently received SBRT to the RLL lesion. He has done well since completing this course and his most recent CT on 05/25/2019 which revealed post radiation change involving the right upper lung, and what appears to be a dictation error but description of decreased visible change in the lower part of the right lung consistent with post radiation effect as well.  There were no new or enlarging pulmonary nodules, we continue to follow a stable 5 mm left lower lobe nodule.  He does have a stable right pleural effusion that is described a small, and stigmata of atherosclerotic changes of the aorta.  He is under the  watch of palliative care with [facility]. He is also being followed for his postherpetic neuralgia by their service as well. He continues to have monthly check-in's due to his memory.    On review of systems, the patient reports that he is doing well overall. He continues to be physically active and just got in today from his walking with his dog Barth Kirks. He has not been able to play ping pong due to the covid pandemic. He is checked on  regularly by his daughters. He states he continues to have chest wall pain from his shingles, but denies any sternal chest pain, shortness of breath, cough, fevers, chills, night sweats, unintended weight changes. He denies any bowel or bladder disturbances, and denies abdominal pain, nausea or vomiting. He denies any new musculoskeletal or joint aches or pains, new skin lesions or concerns. A complete review of systems is obtained and is otherwise negative.     Past Medical History:  Past Medical History:  Diagnosis Date  . ALLERGIC RHINITIS 01/23/2007  . Arthritis    hands  . BENIGN PROSTATIC HYPERTROPHY 09/10/2006  . Chronic headaches   . Chronic sinusitis   . COPD, MILD 04/03/2010   patient denies on preop visit of 08/02/2014   . Difficulty in urination   . Dyspnea   . FATIGUE 01/08/2008  . GERD 04/15/2010  . H. pylori infection    hx of   . Headache(784.0) 09/10/2006  . HOARSENESS 04/15/2010  . HYPERLIPIDEMIA 09/10/2006  . INSOMNIA-SLEEP DISORDER-UNSPEC 04/24/2009  . Lung cancer ([place])   . PAF (paroxysmal atrial fibrillation) ([place])    identified on EKG 04/08/17  . PEPTIC ULCER DISEASE 01/23/2007  . Pneumonia    1955    Past Surgical History: Past Surgical History:  Procedure Laterality Date  . CATARACT EXTRACTION Left   . COLONOSCOPY    . CYSTOSCOPY N/A 10/07/2016   Procedure: CYSTOSCOPY;  Surgeon: Festus Aloe, MD;  Location: [facility];  Service: Urology;  Laterality: N/A;  . FUDUCIAL PLACEMENT Right 02/06/2018   Procedure: PLACEMENT OF FIDUCIAL RIGHT LOWER LOBE LUNG;  Surgeon: [name], MD;  Location: [facility];  Service: Thoracic;  Laterality: Right;  . HEMORRHOID BANDING    . INSERTION OF SUPRAPUBIC CATHETER N/A 10/07/2016   Procedure: FOLEY  CATHETER PLACEMENT;  Surgeon: Festus Aloe, MD;  Location: [facility];  Service: Urology;  Laterality: N/A;  . KNEE ARTHROSCOPY Left    torn meniscus  . LUMBAR LAMINECTOMY/DECOMPRESSION MICRODISCECTOMY Right 08/07/2014   Procedure:  complete DECOMPRESSION LUMBAR LAMINECTOMY/MICRODISCECTOMY OF L4-L5 for spinal stenosis, DECOMPRESSION OF L3-L4;  Surgeon: Latanya Maudlin, MD;  Location: [facility];  Service: Orthopedics;  Laterality: Right;  . NASAL SINUS SURGERY Left 07/04/2017   Procedure: LEFT ENDOSCOPIC SINUS SURGERY;  Surgeon: Jerrell Belfast, MD;  Location: [facility];  Service: ENT;  Laterality: Left;  . RIGHT/LEFT HEART CATH AND CORONARY ANGIOGRAPHY N/A 06/21/2017   Procedure: RIGHT/LEFT HEART CATH AND CORONARY ANGIOGRAPHY;  Surgeon: Belva Crome, MD;  Location: [facility];  Service: Cardiovascular;  Laterality: N/A;  . ROTATOR CUFF REPAIR Right   . SHOULDER ARTHROSCOPY     x3, L & R  . SINUS ENDO WITH FUSION N/A 07/04/2017   Procedure: SINUS ENDO WITH FUSION;  Surgeon: Jerrell Belfast, MD;  Location: [facility];  Service: ENT;  Laterality: N/A;  . VIDEO ASSISTED THORACOSCOPY (VATS)/ LOBECTOMY Right 10/07/2016   Procedure: VIDEO ASSISTED THORACOSCOPY (VATS)/RIGHT MIDDLE LOBECTOMY;  Surgeon: [name], MD;  Location: [facility];  Service: Thoracic;  Laterality: Right;  [name] VIDEO BRONCHOSCOPY WITH ENDOBRONCHIAL NAVIGATION N/A 09/23/2016   Procedure: VIDEO BRONCHOSCOPY WITH ENDOBRONCHIAL NAVIGATION;  Surgeon: [name], MD;  Location: [facility];  Service: Thoracic;  Laterality: N/A;  . VIDEO BRONCHOSCOPY WITH ENDOBRONCHIAL NAVIGATION N/A 02/06/2018   Procedure: VIDEO BRONCHOSCOPY WITH ENDOBRONCHIAL NAVIGATION;  Surgeon: [name], MD;  Location: [facility];  Service: Thoracic;  Laterality: N/A;  . VIDEO BRONCHOSCOPY WITH ENDOBRONCHIAL ULTRASOUND N/A 09/23/2016   Procedure: VIDEO BRONCHOSCOPY WITH ENDOBRONCHIAL ULTRASOUND;  Surgeon: [name], MD;  Location[facility];  Service: Thoracic;  Laterality: N/A;    Social History:  Social History   Socioeconomic History  . Marital status: Widowed    Spouse name: Not on file  . Number of children: 3  . Years of education: Not on file  . Highest education  level: Not on file  Occupational History  . Occupation: Retired    Fish farm manager: RETIRED  Tobacco Use  . Smoking status: Former Smoker    Packs/day: 1.00    Years: 23.00    Pack years: 23.00    Quit date: 28-May-1976    Years since quitting: 43.2  . Smokeless tobacco: Never Used  Substance and Sexual Activity  . Alcohol use: Yes    Comment: rare  . Drug use: No  . Sexual activity: Not on file  Other Topics Concern  . Not on file  Social History Narrative  . Not on file   Social Determinants of Health   Financial Resource Strain:   . Difficulty of Paying Living Expenses:   Food Insecurity:   . Worried About Charity fundraiser in the Last Year:   . Arboriculturist in the Last Year:   Transportation Needs:   . Film/video editor (Medical):   [name] Lack of Transportation (Non-Medical):   Physical Activity:   . Days of Exercise per Week:   . Minutes of Exercise per Session:   Stress:   . Feeling of Stress :   Social Connections:   . Frequency of Communication with Friends and Family:   . Frequency of Social Gatherings with Friends and Family:   . Attends Religious Services:   . Active Member of Clubs or Organizations:   . Attends Archivist Meetings:   [name] Marital Status:   Intimate Partner Violence:   . Fear of Current or Ex-Partner:   . Emotionally Abused:   [name] Physically Abused:   . Sexually Abused:   The patient is widowed. His wife passed away suddenly in 05/29/2018. He is retired from working for [employer], and played [place] at the senior center weekly prior to the covid pandemic.   Family History: Family History  Problem Relation Age of Onset  . Prostate cancer Brother   . Alzheimer's disease Mother   . Alzheimer's disease Sister      ALLERGIES:  is allergic to alfuzosin; aricept [donepezil hcl]; and penicillins.  Meds: Current Outpatient Medications  Medication Sig Dispense Refill  . albuterol (PROVENTIL HFA;VENTOLIN HFA) 108 (90 Base) MCG/ACT  inhaler Inhale 2 puffs into the lungs every 6 (six) hours as needed for wheezing or shortness of breath. 1 Inhaler 11  . apixaban (ELIQUIS) 5 MG TABS tablet Take 1 tablet (5 mg total) by mouth 2 (two) times daily. 180 tablet 1  . calcium carbonate (TUMS - DOSED IN MG ELEMENTAL CALCIUM) 500 MG chewable tablet Chew 1 tablet by mouth daily as needed for indigestion  or heartburn.     . finasteride (PROSCAR) 5 MG tablet Take 5 mg by mouth at bedtime.     . furosemide (LASIX) 20 MG tablet TAKE 1 TABLET BY MOUTH EVERY DAY. 90 tablet 3  . gabapentin (NEURONTIN) 300 MG capsule Take 1 capsule (300 mg total) by mouth 3 (three) times daily. 90 capsule 3  . lidocaine (LIDODERM) 5 % Place 1 patch onto the skin daily. Remove & Discard patch within 12 hours or as directed by MD 30 patch 0  . propafenone (RYTHMOL) 225 MG tablet Take 1 tablet (225 mg total) by mouth 2 (two) times daily. 180 tablet 3  . rivastigmine (EXELON) 1.5 MG capsule Take 1 capsule twice daily 180 capsule 1  . silodosin (RAPAFLO) 8 MG CAPS capsule Take 8 mg by mouth at bedtime.     [name] zolpidem (AMBIEN) 5 MG tablet Take 1 tablet (5 mg total) by mouth at bedtime as needed. for sleep 30 tablet 5   No current facility-administered medications for this encounter.    Physical Findings: Unable to assess due to encounter type.  Lab Findings: Lab Results  Component Value Date   WBC 5.1 11/13/2018   HGB 10.5 (L) 11/13/2018   HCT 31.5 (L) 11/13/2018   MCV 89.7 11/13/2018   PLT 242 11/13/2018     Radiographic Findings: CT CHEST W CONTRAST  Result Date: 05/26/2019 CLINICAL DATA:  Patient with history of right-sided lung cancer. Follow-up exam. EXAM: CT CHEST WITH CONTRAST TECHNIQUE: Multidetector CT imaging of the chest was performed during intravenous contrast administration. CONTRAST:  46mL OMNIPAQUE IOHEXOL 300 MG/ML  SOLN COMPARISON:  CT chest 11/17/2018 FINDINGS: Cardiovascular: Normal heart size. Thoracic aortic vascular calcifications.  No large pericardial effusion. Mediastinum/Nodes: No enlarged axillary, mediastinal or hilar lymphadenopathy. Mild esophageal wall thickening. Lungs/Pleura: Stable post radiation changes involving the right upper lung. Slight the right lower decreased conspicuity of the postradiation changes involving lobe. Stable small right pleural effusion. No new or enlarging pulmonary nodules or masses identified within the lungs bilaterally. Stable 5 mm left lower lobe nodule (image 81; series 7). No pneumothorax. Upper Abdomen: Right renal cyst. Stable atrophy of the left kidney. No acute process. Musculoskeletal: Thoracic spine degenerative changes. No aggressive or acute appearing osseous lesions. IMPRESSION: Stable post radiation changes involving the right lung. No evidence for metastatic disease in the chest. Chronic right pleural effusion. Aortic Atherosclerosis (ICD10-I70.0). Electronically Signed   By: Lovey Newcomer M.D.   On: 05/26/2019 15:19    Impression/Plan: 1. Stage IA3, cT2N0M0, NSCLC, adenocarcinoma of the RLL. The patient appears to be doing very well clinically as well as radiographically. He will continue to follow up with palliative medicine but continue in surveillance with me as long as this meets goals of care and his performance status remains appropriate to do this. We will plan a repeat scan in 6 months time.  2. Stage IB, pT1cN0M0, NSCLC, adenocarcinoma of the right middle lobe of the lung. He continues to be NED and will be followed in close surveillance. 3. Mild cognitive impairment. He continues to be followed by palliative medicine and we will follow this expectantly. 4. Postherpetic Neuralgia. I will message the palliative care team as well since he doesn't feel like his symptoms have improved.   Given current concerns for patient exposure during the COVID-19 pandemic, this encounter was conducted via telephone.  The patient has provided two factor identification and has given verbal  consent for this type of encounter and has been  advised to only accept a meeting of this type in a secure network environment. The time spent during this encounter was 30 minutes including preparation, discussion, and coordination of the patient's care. The attendants for this meeting include  Hayden Pedro  and [name].  During the [place] was located Remotely at home.  Mr. Magnan was located at home. I also left a voicemail for his daughter [name], his HCPOA relaying the good results from his scan.      Carola Rhine, PAC

## 2019-06-20 ENCOUNTER — Telehealth: Payer: Self-pay

## 2019-06-20 NOTE — Telephone Encounter (Signed)
Telephone call to patient to schedule palliative care visit with patient/family. SW received no answer.

## 2019-07-11 ENCOUNTER — Other Ambulatory Visit: Payer: Self-pay | Admitting: Internal Medicine

## 2019-07-12 NOTE — Telephone Encounter (Signed)
Prescription refill request for Eliquis received.  Last office visit: Lovena Le 12/29/2018 Scr: 1.13, 05/25/2019 Age: 86 y.o. Weight: 62.5 kg   Prescription refill sent.

## 2019-07-20 ENCOUNTER — Other Ambulatory Visit: Payer: Medicare Other | Admitting: *Deleted

## 2019-07-20 ENCOUNTER — Other Ambulatory Visit: Payer: Self-pay

## 2019-07-20 DIAGNOSIS — Z515 Encounter for palliative care: Secondary | ICD-10-CM

## 2019-07-25 NOTE — Progress Notes (Signed)
COMMUNITY PALLIATIVE CARE RN NOTE  PATIENT NAME: Raymond Logan DOB: 1931-01-20 MRN: [id number]  PRIMARY CARE PROVIDER: Biagio Borg, MD  RESPONSIBLE PARTY: Raymond Logan (daughter) Acct ID - Guarantor Home Phone Work Phone Relationship Acct Type  [id number] Raymond, [phone]  Self P/F     [address] [place], [place] [address]   Due to the COVID-19 crisis, this virtual check-in visit was done via telephone from my office and it was initiated and consent by this patient and or family.  PLAN OF CARE and INTERVENTION:  1. ADVANCE CARE PLANNING/GOALS OF CARE: Goal is for patient to avoid hospitalizations.  2. PATIENT/CAREGIVER EDUCATION: Symptom management, disease progression 3. DISEASE STATUS: Virtual check-in visit completed via telephone. Patient denies pain at this time. He had been having issues with pain in his side where he had shingles a few months ago, but has not complained of this pain in several weeks. He is not having issues with shortness of breath. He continues to enjoy walking with his dog. He had an issue in his home with the fireplace blowing out soot, which made it unsafe for him to be there. He has been staying with either his daughter Raymond Logan or Raymond Logan in the meantime. He is experiencing increased confusion. He is requiring more prompting and reminders. His daughter has noticed that he will wear the same clothes for several days if he is not reminded to change. He will say he does not have any clothes at their home, because he forgets they are in the closet there. He will also forget that his food is in the refrigerator so has to be reminded or he will only eat snack foods. His intake is variable. He does drink Boost for nutritional supplementation. He has been contemplating whether he will return home or sell his house once repaired and move closer to [place]. He is able to perform ADLs himself, but with prompts. He remains continent of both bowel and bladder.  Daughter gave me updated phone number for patient. Will continue to monitor.  HISTORY OF PRESENT ILLNESS: This is a 86 yo male with a past medical history of Lung cancer, COPD, COPD, GERD, HLD, and BPH. Palliative care team continues to follow patient. Will continue to visit monthly and PRN.  CODE STATUS: Full code  ADVANCED DIRECTIVES: Y MOST FORM: no PPS: 50%   (Duration of visit and documentation 45 minutes)   Raymond Eastern, RN BSN

## 2019-09-29 DIAGNOSIS — Z20822 Contact with and (suspected) exposure to covid-19: Secondary | ICD-10-CM | POA: Diagnosis not present

## 2019-10-24 ENCOUNTER — Encounter: Payer: Self-pay | Admitting: Medical

## 2019-10-29 ENCOUNTER — Telehealth: Payer: Self-pay | Admitting: Neurology

## 2019-10-29 NOTE — Telephone Encounter (Signed)
Pt's daughter [name] on [place] called wanting to speak to provider about pt's future appt that is scheduled. She would like to discuss the pt's progressive Dementia. Please advise.

## 2019-10-30 NOTE — Telephone Encounter (Signed)
Daughter of pt, who states has Durable POA [name]) called to let us know about pt. He is now living with other daughter (in [place], [place]) who is HCPOA.  She has noted decrease in cognitive status (increase memory loss) more in the last 3 months.  Has made financial decisions that are not appropriate.  Has had weight loss, ? Nutrition status, OTC supplements (CBD? Mushroom supp).  Taking exelon?  States cannot live alone.  She wanted pt to have a inoffice visit to assess pt.  Also not to mention how these things known.  I relayed that would state in note, but could not promise that.

## 2019-11-06 ENCOUNTER — Telehealth (INDEPENDENT_AMBULATORY_CARE_PROVIDER_SITE_OTHER): Payer: Medicare Other | Admitting: Neurology

## 2019-11-06 ENCOUNTER — Encounter: Payer: Self-pay | Admitting: Neurology

## 2019-11-06 DIAGNOSIS — R413 Other amnesia: Secondary | ICD-10-CM

## 2019-11-06 MED ORDER — RIVASTIGMINE TARTRATE 1.5 MG PO CAPS: ORAL_CAPSULE | ORAL | 1 refills | Status: DC

## 2019-11-06 NOTE — Progress Notes (Signed)
Virtual Visit via Video Note  I connected with Raymond Logan on 11/06/19 at  1:15 PM EDT by a video enabled telemedicine application and verified that I am speaking with the correct person using two identifiers.  Location: Patient: at his home Provider: in the office    I discussed the limitations of evaluation and management by telemedicine and the availability of in person appointments. The patient expressed understanding and agreed to proceed.  History of Present Illness: 11/06/2019 SS: Raymond Logan is an 86 year old male with history of progressive memory disturbance.  His wife passed away in 30-Mar-2018.  He has relocated to [place] to live with his daughter, Raymond Logan.  He is on low-dose Exelon, weight has remained stable reportedly.  Feels memory is overall improved, he may have an occasional bad day with memory.  He remains on over-the-counter supplements, including mushrooms.  He walks his dog several times a day.  He is able to perform his own ADLs.  No falls.  He is not driving, but he could.  His daughter is managing his finances, Raymond Logan (she and Raymond Logan reportedly do not get along).  He denies any problems or concerns.  He is pleased with his current living situation.  He says he is doing great, he has friends in [place].  Presents today for evaluation via virtual visit, accompanied by his daughter Raymond Logan.  05/02/2019 SS:[name] is a 86 year old male with history of progressive memory disturbance.  He lives alone, his wife passed away in 03-30-18.  For the last several months his daughter has been living with him, Raymond Logan.  He is still able to do a lot on his own.  He could drive a car, but he gave his car to his son.  He does his own laundry, and some housework.  He is able to prepare his meals, eats 3 meals a day, but small portions, drinks ensures.  He has been taken off Ambien, says he sleeps well.  He remains on low-dose Exelon.  He has been unable to tolerate Namenda  due to mild delirium, Aricept caused headache and insomnia.  He takes several over-the-counter supplements, that his daughter feels has been helpful for memory.  His pills are in her pill dispenser, that alarms when it is time to take them.  He has had shingles recently.  He enjoys reading, and doing puzzles.  He denies any new problems or concerns.  He presents today accompanied by his daughter, Raymond Logan.   Observations/Objective: Is alert and oriented, speech is clear and concise, facial symmetry noted, follows commands, gait is slightly wide-based, but stable, no assistive device, mood seems upbeat and cooperative  Moca-blind was 17/22  Assessment and Plan: 1.  Memory disturbance  -Seems to have remained overall stable -Has relocated to [place] to live with his daughter, Raymond Logan, he sold his house -Will continue Exelon 1.5 mg twice daily, previously been unable to tolerate Aricept or Namenda, we have kept low-dose due to weight loss, however reportedly weight is remained stable -Recommend he continue to remain active, enjoys walking his dog -Tried to call his daughter Raymond Logan, I left a message twice -Follow-up in 6 months or sooner if needed  Follow Up Instructions: 6 months 05/07/2020   I discussed the assessment and treatment plan with the patient. The patient was provided an opportunity to ask questions and all were answered. The patient agreed with the plan and demonstrated an understanding of the instructions.   The patient was  advised to call back or seek an in-person evaluation if the symptoms worsen or if the condition fails to improve as anticipated.  I spent 30 minutes of face-to-face and non-face-to-face time with patient.  This included previsit chart review, lab review, study review, order entry, electronic health record documentation, patient education.  Raymond Dakin, DNP  [facility] [address] [place], [place] [address] [phone]

## 2019-11-07 NOTE — Progress Notes (Signed)
I have read the note, and I agree with the clinical assessment and plan.  Halee Glynn K Duchess Armendarez   

## 2019-11-12 ENCOUNTER — Telehealth: Payer: Self-pay | Admitting: Neurology

## 2019-11-12 NOTE — Telephone Encounter (Signed)
Returning phone call from Cristal Generous. Would like to speak with Judson Roch about Pt's last appt.

## 2019-11-12 NOTE — Telephone Encounter (Signed)
I called his daughter, Raymond Logan.  She feels his memory continues to decline.  We talked about considering involvement in a adult day program, or senior center to provide consistent social interaction, and engage in activity.  Raymond Logan is his POA, he lives with his daughter Raymond Logan.  There are some family dynamics at play.  She may come to his next appointment.

## 2019-11-20 ENCOUNTER — Encounter: Payer: Self-pay | Admitting: Neurology

## 2019-11-24 DIAGNOSIS — Z23 Encounter for immunization: Secondary | ICD-10-CM | POA: Diagnosis not present

## 2019-11-26 ENCOUNTER — Telehealth: Payer: Self-pay | Admitting: *Deleted

## 2019-11-26 NOTE — Telephone Encounter (Signed)
CALLED PATIENT TO INFORM OF STAT LABS ON 11-28-19 @ [facility], LVM FOR A RETURN CALL

## 2019-11-28 ENCOUNTER — Ambulatory Visit (HOSPITAL_COMMUNITY)
Admission: RE | Admit: 2019-11-28 | Discharge: 2019-11-28 | Disposition: A | Discharge status: Home / Self Care | Payer: Medicare Other | Source: Ambulatory Visit | Attending: Radiation Oncology | Admitting: Radiation Oncology

## 2019-11-28 ENCOUNTER — Ambulatory Visit
Admission: RE | Admit: 2019-11-28 | Discharge: 2019-11-28 | Disposition: A | Discharge status: Home / Self Care | Payer: Medicare Other | Source: Ambulatory Visit | Attending: Radiation Oncology | Admitting: Radiation Oncology

## 2019-11-28 ENCOUNTER — Other Ambulatory Visit: Payer: Self-pay

## 2019-11-28 ENCOUNTER — Encounter (HOSPITAL_COMMUNITY): Payer: Self-pay

## 2019-11-28 DIAGNOSIS — C3431 Malignant neoplasm of lower lobe, right bronchus or lung: Secondary | ICD-10-CM | POA: Diagnosis not present

## 2019-11-28 DIAGNOSIS — C342 Malignant neoplasm of middle lobe, bronchus or lung: Secondary | ICD-10-CM

## 2019-11-28 LAB — BUN & CREATININE (CHCC)
BUN: 19 mg/dL (ref 8–23)
Creatinine: 1.1 mg/dL (ref 0.61–1.24)
GFR, Est AFR Am: >60 mL/min (ref >60)
GFR, Estimated: >60 mL/min (ref >60)

## 2019-11-28 MED ORDER — IOHEXOL 300 MG/ML  SOLN: 100.0000 mL | Freq: Once | INTRAMUSCULAR | Status: DC | PRN

## 2019-11-28 MED ORDER — IOHEXOL 300 MG/ML  SOLN
75.0000 mL | Freq: Once | INTRAMUSCULAR | Status: AC | PRN
  Administered 2019-11-28: 75 mL via INTRAVENOUS

## 2019-12-03 ENCOUNTER — Other Ambulatory Visit: Payer: Self-pay

## 2019-12-03 ENCOUNTER — Ambulatory Visit
Admission: RE | Admit: 2019-12-03 | Discharge: 2019-12-03 | Disposition: A | Discharge status: Home / Self Care | Payer: Medicare Other | Source: Ambulatory Visit | Attending: Radiation Oncology | Admitting: Radiation Oncology

## 2019-12-03 DIAGNOSIS — C342 Malignant neoplasm of middle lobe, bronchus or lung: Secondary | ICD-10-CM

## 2019-12-03 NOTE — Progress Notes (Signed)
Radiation Oncology         [phone] ________________________________  Outpatient Follow Up- Conducted via telephone due to current COVID-19 concerns for limiting patient exposure  I spoke with the patient to conduct this consult visit via telephone to spare the patient unnecessary potential exposure in the healthcare setting during the current COVID-19 pandemic. The patient was notified in advance and was offered a [place] meeting to allow for face to face communication but unfortunately reported that they did not have the appropriate resources/technology to support such a visit and instead preferred to proceed with a telephone visit.    Name: Raymond Logan MRN: [id number]  Date of Service: 12/03/2019 DOB: 28-Nov-1930  CC: Biagio Borg, MD  Biagio Borg, MD  Diagnosis:   Stage IB, pT1cN0, NSCLC, adenocarcinoma of the right middle lobe of the lung with new finding of RLL lesion.  Prior Radiation Treatment:   02/24/18-03/09/2018 SBRT Treatment: The RLL target was treated to 50 Gy in 5 fractions  11/18/2016 - 12/30/2016: The patient was treated to the right middle lobe surgical bed to a dose of 60 Gy in 30 fractions using a 3 field, 3-D conformal technique.   Narrative:  Raymond Logan is a pleasant 86 y.o. gentleman who was treated with radiotherapy for a positive margin following surgical resection of a stage I lung cancer in 2018. He tolerated radiation with the exception of "mental fogginess." After reviewing his case with his daughter who's a physical therapist, the family had noticed increasing mental confusion and decline. He did undergo a brain MRI to rule out metastatic disease which was negative for metastases on10/24/18. He is followed by Dr. Jannifer Franklin in Neurology. He has been followed in surveillance but in 2019, there was concern for new disease in the right lower lobe and post radiotherapy changes in the right aspect of the lung.  He subsequently received SBRT to the RLL lesion. He  has done well since completing this course and has been NED since but has shown post radiation change involving the right upper lung. We continue to follow a stable 5 mm left lower lobe nodule. He is under the watch of palliative care with [facility].  He also continues to see neurology approximately every 6 months.  He remains active despite his early dementia and is still able to do many of the things he enjoys.  Routine surveillance CT on 11/28/2019 showed posttreatment changes in the right hemithorax and right fibrous changes that were read as stable without concerns for recurrent disease and persistent stigmata of atherosclerotic changes of the aorta and coronary vessels were noted.  A presumed hemangioma in the spleen and cystic appearing change in the right kidney were noted without any specific concerns by radiology.  He is contacted today by phone in the presence of his daughter.  He has since relocated living to [place] where he stays with his daughters.  On review of systems, the patient reports that he is doing well overall.  His daughter states that the last time he went to see neurology they felt like he had normal mental status scores when he was checked.  Though he continues to be followed by palliative care, they have not had any active interventions at this time and follow him as needed but from the notes it appears they were trying to make monthly contact.  He has not been having any symptoms of shortness of breath or chest pain verbalized during our encounter today.  No other  complaints are verbalized.  Past Medical History:  Past Medical History:  Diagnosis Date  . ALLERGIC RHINITIS 01/23/2007  . Arthritis    hands  . BENIGN PROSTATIC HYPERTROPHY 09/10/2006  . Chronic headaches   . Chronic sinusitis   . COPD, MILD 04/03/2010   patient denies on preop visit of 08/02/2014   . Difficulty in urination   . Dyspnea   . FATIGUE 01/08/2008  . GERD 04/15/2010  . H. pylori infection     hx of   . Headache(784.0) 09/10/2006  . HOARSENESS 04/15/2010  . HYPERLIPIDEMIA 09/10/2006  . INSOMNIA-SLEEP DISORDER-UNSPEC 04/24/2009  . Lung cancer ([place])   . PAF (paroxysmal atrial fibrillation) ([place])    identified on EKG 04/08/17  . PEPTIC ULCER DISEASE 01/23/2007  . Pneumonia    1955    Past Surgical History: Past Surgical History:  Procedure Laterality Date  . CATARACT EXTRACTION Left   . COLONOSCOPY    . CYSTOSCOPY N/A 10/07/2016   Procedure: CYSTOSCOPY;  Surgeon: Festus Aloe, MD;  Location: [place];  Service: Urology;  Laterality: N/A;  . FUDUCIAL PLACEMENT Right 02/06/2018   Procedure: PLACEMENT OF FIDUCIAL RIGHT LOWER LOBE LUNG;  Surgeon: [name], MD;  Location: [place];  Service: Thoracic;  Laterality: Right;  . HEMORRHOID BANDING    . INSERTION OF SUPRAPUBIC CATHETER N/A 10/07/2016   Procedure: FOLEY  CATHETER PLACEMENT;  Surgeon: Festus Aloe, MD;  Location: [place];  Service: Urology;  Laterality: N/A;  . KNEE ARTHROSCOPY Left    torn meniscus  . LUMBAR LAMINECTOMY/DECOMPRESSION MICRODISCECTOMY Right 08/07/2014   Procedure: complete DECOMPRESSION LUMBAR LAMINECTOMY/MICRODISCECTOMY OF L4-L5 for spinal stenosis, DECOMPRESSION OF L3-L4;  Surgeon: Latanya Maudlin, MD;  Location: [facility];  Service: Orthopedics;  Laterality: Right;  . NASAL SINUS SURGERY Left 07/04/2017   Procedure: LEFT ENDOSCOPIC SINUS SURGERY;  Surgeon: Jerrell Belfast, MD;  Location: [place];  Service: ENT;  Laterality: Left;  . RIGHT/LEFT HEART CATH AND CORONARY ANGIOGRAPHY N/A 06/21/2017   Procedure: RIGHT/LEFT HEART CATH AND CORONARY ANGIOGRAPHY;  Surgeon: Belva Crome, MD;  Location: [facility];  Service: Cardiovascular;  Laterality: N/A;  . ROTATOR CUFF REPAIR Right   . SHOULDER ARTHROSCOPY     x3, L & R  . SINUS ENDO WITH FUSION N/A 07/04/2017   Procedure: SINUS ENDO WITH FUSION;  Surgeon: Jerrell Belfast, MD;  Location: [place];  Service: ENT;  Laterality: N/A;  . VIDEO ASSISTED  THORACOSCOPY (VATS)/ LOBECTOMY Right 10/07/2016   Procedure: VIDEO ASSISTED THORACOSCOPY (VATS)/RIGHT MIDDLE LOBECTOMY;  Surgeon: [name], MD;  Location: [place];  Service: Thoracic;  Laterality: Right;  [name] VIDEO BRONCHOSCOPY WITH ENDOBRONCHIAL NAVIGATION N/A 09/23/2016   Procedure: VIDEO BRONCHOSCOPY WITH ENDOBRONCHIAL NAVIGATION;  Surgeon: [name], MD;  Location: [place];  Service: Thoracic;  Laterality: N/A;  . VIDEO BRONCHOSCOPY WITH ENDOBRONCHIAL NAVIGATION N/A 02/06/2018   Procedure: VIDEO BRONCHOSCOPY WITH ENDOBRONCHIAL NAVIGATION;  Surgeon: [name], MD;  Location: [place];  Service: Thoracic;  Laterality: N/A;  . VIDEO BRONCHOSCOPY WITH ENDOBRONCHIAL ULTRASOUND N/A 09/23/2016   Procedure: VIDEO BRONCHOSCOPY WITH ENDOBRONCHIAL ULTRASOUND;  Surgeon: [name], MD;  Location: [facility];  Service: Thoracic;  Laterality: N/A;    Social History:  Social History   Socioeconomic History  . Marital status: Widowed    Spouse name: Not on file  . Number of children: 3  . Years of education: Not on file  . Highest education level: Not on file  Occupational History  .  Occupation: Retired    Fish farm manager: RETIRED  Tobacco Use  . Smoking status: Former Smoker    Packs/day: 1.00    Years: 23.00    Pack years: 23.00    Quit date: 1976/06/01    Years since quitting: 43.7  . Smokeless tobacco: Never Used  Vaping Use  . Vaping Use: Never used  Substance and Sexual Activity  . Alcohol use: Yes    Comment: rare  . Drug use: No  . Sexual activity: Not on file  Other Topics Concern  . Not on file  Social History Narrative  . Not on file   Social Determinants of Health   Financial Resource Strain:   . Difficulty of Paying Living Expenses: Not on file  Food Insecurity:   . Worried About Charity fundraiser in the Last Year: Not on file  . Ran Out of Food in the Last Year: Not on file  Transportation Needs:   . Lack of Transportation (Medical): Not on file    . Lack of Transportation (Non-Medical): Not on file  Physical Activity:   . Days of Exercise per Week: Not on file  . Minutes of Exercise per Session: Not on file  Stress:   . Feeling of Stress : Not on file  Social Connections:   . Frequency of Communication with Friends and Family: Not on file  . Frequency of Social Gatherings with Friends and Family: Not on file  . Attends Religious Services: Not on file  . Active Member of Clubs or Organizations: Not on file  . Attends Archivist Meetings: Not on file  . Marital Status: Not on file  Intimate Partner Violence:   . Fear of Current or Ex-Partner: Not on file  . Emotionally Abused: Not on file  . Physically Abused: Not on file  . Sexually Abused: Not on file  The patient is widowed. His wife passed away suddenly in 06-02-18. He is retired from working for [employer], and played [place] at the senior center weekly prior to the covid pandemic.  He is currently living in [place] with daughters who alternate caring for him.  Family History: Family History  Problem Relation Age of Onset  . Prostate cancer Brother   . Alzheimer's disease Mother   . Alzheimer's disease Sister      ALLERGIES:  is allergic to alfuzosin, aricept [donepezil hcl], and penicillins.  Meds: Current Outpatient Medications  Medication Sig Dispense Refill  . apixaban (ELIQUIS) 5 MG TABS tablet Eliquis 5 mg tablet  TAKE 1 TABLET BY MOUTH 2 TIMES DAILY    . calcium carbonate (TUMS - DOSED IN MG ELEMENTAL CALCIUM) 500 MG chewable tablet Chew 1 tablet by mouth daily as needed for indigestion or heartburn.     [name] ELIQUIS 5 MG TABS tablet TAKE 1 TABLET BY MOUTH 2 TIMES DAILY 180 tablet 1  . finasteride (PROSCAR) 5 MG tablet Take 5 mg by mouth at bedtime.     . furosemide (LASIX) 20 MG tablet TAKE 1 TABLET BY MOUTH EVERY DAY. 90 tablet 3  . propafenone (RYTHMOL) 225 MG tablet Take 1 tablet (225 mg total) by mouth 2 (two) times daily. 180 tablet  3  . rivastigmine (EXELON) 1.5 MG capsule Take 1 capsule twice daily 180 capsule 1  . silodosin (RAPAFLO) 8 MG CAPS capsule Take 8 mg by mouth at bedtime.      No current facility-administered medications for this encounter.    Physical Findings: Unable to  assess due to encounter type.  Lab Findings: Lab Results  Component Value Date   WBC 5.1 11/13/2018   HGB 10.5 (L) 11/13/2018   HCT 31.5 (L) 11/13/2018   MCV 89.7 11/13/2018   PLT 242 11/13/2018     Radiographic Findings: CT CHEST W CONTRAST  Result Date: 11/28/2019 CLINICAL DATA:  Lung cancer.  Radiation therapy complete. EXAM: CT CHEST WITH CONTRAST TECHNIQUE: Multidetector CT imaging of the chest was performed during intravenous contrast administration. CONTRAST:  14mL OMNIPAQUE IOHEXOL 300 MG/ML  SOLN COMPARISON:  05/25/2019. FINDINGS: Cardiovascular: Atherosclerotic calcification of the aorta, aortic valve and coronary arteries. Heart size normal. No pericardial effusion. Mediastinum/Nodes: No pathologically enlarged mediastinal, hilar or axillary lymph nodes. Esophagus is grossly unremarkable. Lungs/Pleura: Post treatment parenchymal consolidation/retraction and bronchiectasis in the right hemithorax. Small right fibrothorax, unchanged. 5 mm subpleural nodule along the left major fissure is unchanged and likely a subpleural lymph node. No left pleural fluid. Airway is otherwise unremarkable. Upper Abdomen: Visualized portions of the liver, gallbladder and adrenal glands are unremarkable. Low-attenuation mass in the right kidney measures 4.3 cm, incompletely imaged. Extensive scarring in the left kidney with subcentimeter low attenuations difficult to further characterize due to size. Hyperattenuating lesion in the superior spleen measures 1.6 cm, similar and possibly a hemangioma. Visualized portions of the pancreas, stomach and bowel are grossly unremarkable. Musculoskeletal: Degenerative changes in the spine. No worrisome lytic or  sclerotic lesions. IMPRESSION: 1. Post treatment changes in the right hemithorax and small right fibrothorax, stable. No evidence of recurrent or metastatic disease. 2. Aortic atherosclerosis (ICD10-I70.0). Coronary artery calcification. Electronically Signed   By: Lorin Picket M.D.   On: 11/28/2019 16:01    Impression/Plan: 1. Stage IA3, cT2N0M0, NSCLC, adenocarcinoma of the RLL.  The patient appears to be clinically and radiographically doing well by conversation on the phone today.  We discussed the rationale to continue following him at 28-month intervals as he and his family wish to remain aggressive about his surveillance.  We did discuss that the purpose of this would be to pick up on changes that might require conversation about further treatment.  The patient has told his family that he might not be interested in any further treatment, but they are still interested in more aggressive surveillance.  I tried to explain that we have to be judicious about reacting to the information we are ordering on a study, and that if he truly did not desire or was not well enough to consider radiotherapy, then perhaps we should refrain from further surveillance.  That being said the patient and his daughter wish to remain vigilant, we can always revisit this if there was a need for consideration of further therapy, I will plan to order repeat scan to be performed in 6 months time, but we also discussed the possibility of extending this interval or discontinuing surveillance if this did not meet the goals and purposes of pursuing these imaging tests.   2. Stage IB, pT1cN0M0, NSCLC, adenocarcinoma of the right middle lobe of the lung.  As above he continues to be NED and will be followed in close surveillance. 3. Mild cognitive impairment.  The patient clinically has been doing well according to his family and per neurology they plan to follow him at 45-month intervals as well.  This will be followed expectantly but  should be taken into consideration when deciding surveillance for #1 and #2.   Given current concerns for patient exposure during the COVID-19 pandemic, this encounter was  conducted via telephone.  The patient has provided two factor identification and has given verbal consent for this type of encounter and has been advised to only accept a meeting of this type in a secure network environment. The time spent during this encounter was 30 minutes including preparation, discussion, and coordination of the patient's care. The attendants for this meeting include  Hayden Pedro  and [name] and Viann Fish. During the [facility] was located at [facility] in the Radiation Oncology Department. Mr. Zawadzki was located remotely at his daughter Jean's home who joined [place] on the call.      Carola Rhine, PAC

## 2019-12-05 ENCOUNTER — Other Ambulatory Visit: Payer: Self-pay | Admitting: Radiation Oncology

## 2019-12-05 DIAGNOSIS — C342 Malignant neoplasm of middle lobe, bronchus or lung: Secondary | ICD-10-CM

## 2019-12-05 DIAGNOSIS — R911 Solitary pulmonary nodule: Secondary | ICD-10-CM

## 2020-02-27 DIAGNOSIS — R3915 Urgency of urination: Secondary | ICD-10-CM | POA: Diagnosis not present

## 2020-02-27 DIAGNOSIS — N401 Enlarged prostate with lower urinary tract symptoms: Secondary | ICD-10-CM | POA: Diagnosis not present

## 2020-04-07 ENCOUNTER — Telehealth: Payer: Self-pay | Admitting: Internal Medicine

## 2020-04-07 NOTE — Telephone Encounter (Signed)
Pt's ate 87, wt 62.5 kg, SCr 1.10, CrCl 41.82, last ov w/ Dr. Lovena Le 12/29/18. Pt is overdue to see Dr. Lovena Le.   Will send in 30 day supply and route to scheduling.

## 2020-04-17 NOTE — Telephone Encounter (Signed)
  Spoke to patient regarding his need for a follow up visit so he can continue to receive refills. Patient stated he will call back once he speaks with his daughter because she is his transportation.

## 2020-05-06 NOTE — Progress Notes (Deleted)
PATIENT: Raymond Logan DOB: February 20, 1931  REASON FOR VISIT: follow up HISTORY FROM: patient  HISTORY OF PRESENT ILLNESS: Today 05/06/20  Mr. Bagot is an 87 year old male with history of progressive memory disturbance.  His wife passed away in Apr 05, 2018, he has relocated to [place] to live with his daughter, Raymond Logan.  HISTORY 11/06/2019 SS: Mr. Thorington is an 87 year old male with history of progressive memory disturbance.  His wife passed away in April 05, 2018.  He has relocated to [place] to live with his daughter, Raymond Logan.  He is on low-dose Exelon, weight has remained stable reportedly.  Feels memory is overall improved, he may have an occasional bad day with memory.  He remains on over-the-counter supplements, including mushrooms.  He walks his dog several times a day.  He is able to perform his own ADLs.  No falls.  He is not driving, but he could.  His daughter is managing his finances, Raymond Logan (she and Raymond Logan reportedly do not get along).  He denies any problems or concerns.  He is pleased with his current living situation.  He says he is doing great, he has friends in [place].  Presents today for evaluation via virtual visit, accompanied by his daughter Raymond Logan.   REVIEW OF SYSTEMS: Out of a complete 14 system review of symptoms, the patient complains only of the following symptoms, and all other reviewed systems are negative.  ALLERGIES: Allergies  Allergen Reactions  . Alfuzosin Other (See Comments) and Hypertension    BP went crazy, dizzy, passing out   . Aricept [Donepezil Hcl] Other (See Comments)    Insomnia, headache  . Penicillins Hives and Other (See Comments)    A PCN REACTION WITH IMMEDIATE RASH, FACIAL/TONGUE/THROAT SWELLING, SOB, OR LIGHTHEADEDNESS WITH HYPOTENSION:  #  #  #  YES  #  #  #   Has patient had a PCN reaction causing severe rash involving mucus membranes or skin necrosis:No Has patient had a PCN reaction that required hospitalization:No Has  patient had a PCN reaction occurring within the last 10 years:No If all of the above answers are "NO", then may proceed with Cephalosporin use.     HOME MEDICATIONS: Outpatient Medications Prior to Visit  Medication Sig Dispense Refill  . apixaban (ELIQUIS) 5 MG TABS tablet Eliquis 5 mg tablet  TAKE 1 TABLET BY MOUTH 2 TIMES DAILY    . calcium carbonate (TUMS - DOSED IN MG ELEMENTAL CALCIUM) 500 MG chewable tablet Chew 1 tablet by mouth daily as needed for indigestion or heartburn.     [name] ELIQUIS 5 MG TABS tablet TAKE 1 TABLET BY MOUTH TWICE DAILY 60 tablet 0  . finasteride (PROSCAR) 5 MG tablet Take 5 mg by mouth at bedtime.     . furosemide (LASIX) 20 MG tablet TAKE 1 TABLET BY MOUTH EVERY DAY. 90 tablet 3  . propafenone (RYTHMOL) 225 MG tablet Take 1 tablet (225 mg total) by mouth 2 (two) times daily. 180 tablet 3  . rivastigmine (EXELON) 1.5 MG capsule Take 1 capsule twice daily 180 capsule 1  . silodosin (RAPAFLO) 8 MG CAPS capsule Take 8 mg by mouth at bedtime.      No facility-administered medications prior to visit.    PAST MEDICAL HISTORY: Past Medical History:  Diagnosis Date  . ALLERGIC RHINITIS 01/23/2007  . Arthritis    hands  . BENIGN PROSTATIC HYPERTROPHY 09/10/2006  . Chronic headaches   . Chronic sinusitis   . COPD, MILD 04/03/2010  patient denies on preop visit of 08/02/2014   . Difficulty in urination   . Dyspnea   . FATIGUE 01/08/2008  . GERD 04/15/2010  . H. pylori infection    hx of   . Headache(784.0) 09/10/2006  . HOARSENESS 04/15/2010  . HYPERLIPIDEMIA 09/10/2006  . INSOMNIA-SLEEP DISORDER-UNSPEC 04/24/2009  . Lung cancer ([place])   . PAF (paroxysmal atrial fibrillation) ([place])    identified on EKG 04/08/17  . PEPTIC ULCER DISEASE 01/23/2007  . Pneumonia    1955    PAST SURGICAL HISTORY: Past Surgical History:  Procedure Laterality Date  . CATARACT EXTRACTION Left   . COLONOSCOPY    . CYSTOSCOPY N/A 10/07/2016   Procedure: CYSTOSCOPY;  Surgeon: Festus Aloe, MD;  Location: [facility];  Service: Urology;  Laterality: N/A;  . FUDUCIAL PLACEMENT Right 02/06/2018   Procedure: PLACEMENT OF FIDUCIAL RIGHT LOWER LOBE LUNG;  Surgeon: [name], MD;  Location: [facility];  Service: Thoracic;  Laterality: Right;  . HEMORRHOID BANDING    . INSERTION OF SUPRAPUBIC CATHETER N/A 10/07/2016   Procedure: FOLEY  CATHETER PLACEMENT;  Surgeon: Festus Aloe, MD;  Location: [facility];  Service: Urology;  Laterality: N/A;  . KNEE ARTHROSCOPY Left    torn meniscus  . LUMBAR LAMINECTOMY/DECOMPRESSION MICRODISCECTOMY Right 08/07/2014   Procedure: complete DECOMPRESSION LUMBAR LAMINECTOMY/MICRODISCECTOMY OF L4-L5 for spinal stenosis, DECOMPRESSION OF L3-L4;  Surgeon: Latanya Maudlin, MD;  [facility];  Service: Orthopedics;  Laterality: Right;  . NASAL SINUS SURGERY Left 07/04/2017   Procedure: LEFT ENDOSCOPIC SINUS SURGERY;  Surgeon: Jerrell Belfast, MD;  Location: [facility];  Service: ENT;  Laterality: Left;  . RIGHT/LEFT HEART CATH AND CORONARY ANGIOGRAPHY N/A 06/21/2017   Procedure: RIGHT/LEFT HEART CATH AND CORONARY ANGIOGRAPHY;  Surgeon: Belva Crome, MD;  Location: [facility];  Service: Cardiovascular;  Laterality: N/A;  . ROTATOR CUFF REPAIR Right   . SHOULDER ARTHROSCOPY     x3, L & R  . SINUS ENDO WITH FUSION N/A 07/04/2017   Procedure: SINUS ENDO WITH FUSION;  Surgeon: Jerrell Belfast, MD;  Location: [facility];  Service: ENT;  Laterality: N/A;  . VIDEO ASSISTED THORACOSCOPY (VATS)/ LOBECTOMY Right 10/07/2016   Procedure: VIDEO ASSISTED THORACOSCOPY (VATS)/RIGHT MIDDLE LOBECTOMY;  Surgeon: [name], MD;  Location: [facility];  Service: Thoracic;  Laterality: Right;  [name] VIDEO BRONCHOSCOPY WITH ENDOBRONCHIAL NAVIGATION N/A 09/23/2016   Procedure: VIDEO BRONCHOSCOPY WITH ENDOBRONCHIAL NAVIGATION;  Surgeon: [name], MD;  Location: [facility];  Service: Thoracic;  Laterality: N/A;  . VIDEO BRONCHOSCOPY WITH ENDOBRONCHIAL NAVIGATION N/A 02/06/2018    Procedure: VIDEO BRONCHOSCOPY WITH ENDOBRONCHIAL NAVIGATION;  Surgeon: [name], MD;  [facility];  Service: Thoracic;  Laterality: N/A;  . VIDEO BRONCHOSCOPY WITH ENDOBRONCHIAL ULTRASOUND N/A 09/23/2016   Procedure: VIDEO BRONCHOSCOPY WITH ENDOBRONCHIAL ULTRASOUND;  Surgeon: [name], MD;  [facility];  Service: Thoracic;  Laterality: N/A;    FAMILY HISTORY: Family History  Problem Relation Age of Onset  . Prostate cancer Brother   . Alzheimer's disease Mother   . Alzheimer's disease Sister     SOCIAL HISTORY: Social History   Socioeconomic History  . Marital status: Widowed    Spouse name: Not on file  . Number of children: 3  . Years of education: Not on file  . Highest education level: Not on file  Occupational History  . Occupation: Retired    Fish farm manager: RETIRED  Tobacco Use  . Smoking status: Former Smoker  Packs/day: 1.00    Years: 23.00    Pack years: 23.00    Quit date: 4    Years since quitting: 44.2  . Smokeless tobacco: Never Used  Vaping Use  . Vaping Use: Never used  Substance and Sexual Activity  . Alcohol use: Yes    Comment: rare  . Drug use: No  . Sexual activity: Not on file  Other Topics Concern  . Not on file  Social History Narrative  . Not on file   Social Determinants of Health   Financial Resource Strain: Not on file  Food Insecurity: Not on file  Transportation Needs: Not on file  Physical Activity: Not on file  Stress: Not on file  Social Connections: Not on file  Intimate Partner Violence: Not on file      PHYSICAL EXAM  There were no vitals filed for this visit. There is no height or weight on file to calculate BMI.  Generalized: Well developed, in no acute distress   Neurological examination  Mentation: Alert oriented to time, place, history taking. Follows all commands speech and language fluent Cranial nerve II-XII: Pupils were equal round reactive to light. Extraocular movements  were full, visual field were full on confrontational test. Facial sensation and strength were normal. Uvula tongue midline. Head turning and shoulder shrug  were normal and symmetric. Motor: The motor testing reveals 5 over 5 strength of all 4 extremities. Good symmetric motor tone is noted throughout.  Sensory: Sensory testing is intact to soft touch on all 4 extremities. No evidence of extinction is noted.  Coordination: Cerebellar testing reveals good finger-nose-finger and heel-to-shin bilaterally.  Gait and station: Gait is normal. Tandem gait is normal. Romberg is negative. No drift is seen.  Reflexes: Deep tendon reflexes are symmetric and normal bilaterally.   DIAGNOSTIC DATA (LABS, IMAGING, TESTING) - I reviewed patient records, labs, notes, testing and imaging myself where available.  Lab Results  Component Value Date   WBC 5.1 11/13/2018   HGB 10.5 (L) 11/13/2018   HCT 31.5 (L) 11/13/2018   MCV 89.7 11/13/2018   PLT 242 11/13/2018      Component Value Date/Time   NA 141 11/13/2018 1355   NA 139 08/02/2017 1242   NA 142 11/04/2016 1334   K 4.1 11/13/2018 1355   K 4.0 11/04/2016 1334   CL 105 11/13/2018 1355   CO2 31 11/13/2018 1355   CO2 26 11/04/2016 1334   GLUCOSE 128 (H) 11/13/2018 1355   GLUCOSE 110 11/04/2016 1334   BUN 19 11/28/2019 0956   BUN 15 08/02/2017 1242   BUN 15.4 11/04/2016 1334   CREATININE 1.10 11/28/2019 0956   CREATININE 1.2 11/04/2016 1334   CALCIUM 8.7 (L) 11/13/2018 1355   CALCIUM 9.3 11/04/2016 1334   PROT 6.7 11/13/2018 1355   PROT 6.8 11/04/2016 1334   ALBUMIN 3.1 (L) 11/13/2018 1355   ALBUMIN 2.9 (L) 11/04/2016 1334   AST 9 (L) 11/13/2018 1355   AST 14 11/04/2016 1334   ALT <6 11/13/2018 1355   ALT 10 11/04/2016 1334   ALKPHOS 89 11/13/2018 1355   ALKPHOS 91 11/04/2016 1334   BILITOT 0.2 (L) 11/13/2018 1355   BILITOT 0.37 11/04/2016 1334   GFRNONAA >60 11/28/2019 0956   GFRAA >60 11/28/2019 0956   Lab Results  Component Value  Date   CHOL 169 03/18/2017   HDL 35.90 (L) 03/18/2017   LDLCALC 108 (H) 03/18/2017   LDLDIRECT 134.4 03/27/2010   TRIG 122.0 03/18/2017  CHOLHDL 5 03/18/2017   Lab Results  Component Value Date   HGBA1C 4.9 04/12/2018   Lab Results  Component Value Date   VITAMINB12 512 12/15/2016   Lab Results  Component Value Date   TSH 0.92 11/09/2017      ASSESSMENT AND PLAN 87 y.o. year old male  has a past medical history of ALLERGIC RHINITIS (01/23/2007), Arthritis, BENIGN PROSTATIC HYPERTROPHY (09/10/2006), Chronic headaches, Chronic sinusitis, COPD, MILD (04/03/2010), Difficulty in urination, Dyspnea, FATIGUE (01/08/2008), GERD (04/15/2010), H. pylori infection, Headache(784.0) (09/10/2006), HOARSENESS (04/15/2010), HYPERLIPIDEMIA (09/10/2006), INSOMNIA-SLEEP DISORDER-UNSPEC (04/24/2009), Lung cancer ([place]), PAF (paroxysmal atrial fibrillation) ([place]), PEPTIC ULCER DISEASE (01/23/2007), and Pneumonia. here with ***   I spent 15 minutes with the patient. 50% of this time was spent   Butler Denmark, [place], DNP 05/06/2020, 4:02 PM [facility] [address], [place], [place] [address] [phone]

## 2020-05-07 ENCOUNTER — Encounter: Payer: Self-pay | Admitting: Neurology

## 2020-05-07 ENCOUNTER — Ambulatory Visit: Payer: Medicare Other | Admitting: Neurology

## 2020-05-08 ENCOUNTER — Other Ambulatory Visit: Payer: Self-pay | Admitting: Internal Medicine

## 2020-05-08 NOTE — Telephone Encounter (Signed)
Spoke to patient again regarding staff message we got stating that pt needs appt for further refills. He stated he will have to speak with his daughter so he can set up transportation. Advised him to please give [place] a call back so we can schedule.

## 2020-05-08 NOTE — Telephone Encounter (Signed)
Pt last saw Dr Lovena Le 12/29/18, pt is overdue for follow-up.  Phone message in [place] from 04/07/20 pt called made aware overdue for follow-up, and needed to schedule appt for future refills.  Pt has not scheduled follow-up appt as of yet.  Will send message to schedulers to call pt to make overdue follow-up appt. Last labs 11/28/19 Creat 1.10, age 87, weight 62.5kg, based on specified criteria pt is on appropriate dosage of Eliquis 5mg  BID.  Will refill rx once pt has follow-up appt.

## 2020-05-08 NOTE — Telephone Encounter (Signed)
Daughter called back, scheduled follow-up appt with Barrington Ellison, [place] on 05/16/20.  Will refill rx to get pt to upcoming appt.

## 2020-05-16 ENCOUNTER — Encounter: Payer: Self-pay | Admitting: Student

## 2020-05-16 ENCOUNTER — Other Ambulatory Visit: Payer: Self-pay

## 2020-05-16 ENCOUNTER — Ambulatory Visit (INDEPENDENT_AMBULATORY_CARE_PROVIDER_SITE_OTHER): Payer: Medicare Other | Admitting: Student

## 2020-05-16 VITALS — BP 110/70 | HR 72 | Ht 70.0 in | Wt 137.4 lb

## 2020-05-16 DIAGNOSIS — I480 Paroxysmal atrial fibrillation: Secondary | ICD-10-CM

## 2020-05-16 DIAGNOSIS — R0600 Dyspnea, unspecified: Secondary | ICD-10-CM | POA: Diagnosis not present

## 2020-05-16 MED ORDER — PROPAFENONE HCL 225 MG PO TABS: 225.0000 mg | ORAL_TABLET | Freq: Two times a day (BID) | ORAL | 3 refills | Status: DC

## 2020-05-16 NOTE — Patient Instructions (Signed)
Medication Instructions:  Your physician recommends that you continue on your current medications as directed. Please refer to the Current Medication list given to you today.  *If you need a refill on your cardiac medications before your next appointment, please call your pharmacy*   Lab Work: TODAY: BMET, MAG, CBC  If you have labs (blood work) drawn today and your tests are completely normal, you will receive your results only by: [name] MyChart Message (if you have MyChart) OR . A paper copy in the mail If you have any lab test that is abnormal or we need to change your treatment, we will call you to review the results.   Follow-Up: At [facility], you and your health needs are our priority.  As part of our continuing mission to provide you with exceptional heart care, we have created designated Provider Care Teams.  These Care Teams include your primary Cardiologist (physician) and Advanced Practice Providers (APPs -  Physician Assistants and Nurse Practitioners) who all work together to provide you with the care you need, when you need it.  Your next appointment:   6 month(s)  The format for your next appointment:   In Person  Provider:   Cristopher Peru, MD

## 2020-05-16 NOTE — Progress Notes (Signed)
PCP:  Biagio Borg, MD Primary Cardiologist: Cristopher Peru, MD Electrophysiologist: None   Raymond Logan is a 87 y.o. male with h/o PAF, chronic diastolic CHF, and prior lung CA seen today for None for routine electrophysiology followup.  Since last being seen in our clinic the patient reports doing well overall.  he denies chest pain, palpitations, dyspnea, PND, orthopnea, nausea, vomiting, dizziness, syncope, edema, weight gain, or early satiety.  Past Medical History:  Diagnosis Date  . ALLERGIC RHINITIS 01/23/2007  . Arthritis    hands  . BENIGN PROSTATIC HYPERTROPHY 09/10/2006  . Chronic headaches   . Chronic sinusitis   . COPD, MILD 04/03/2010   patient denies on preop visit of 08/02/2014   . Difficulty in urination   . Dyspnea   . FATIGUE 01/08/2008  . GERD 04/15/2010  . H. pylori infection    hx of   . Headache(784.0) 09/10/2006  . HOARSENESS 04/15/2010  . HYPERLIPIDEMIA 09/10/2006  . INSOMNIA-SLEEP DISORDER-UNSPEC 04/24/2009  . Lung cancer ([place])   . PAF (paroxysmal atrial fibrillation) ([place])    identified on EKG 04/08/17  . PEPTIC ULCER DISEASE 01/23/2007  . Pneumonia    1955   Past Surgical History:  Procedure Laterality Date  . CATARACT EXTRACTION Left   . COLONOSCOPY    . CYSTOSCOPY N/A 10/07/2016   Procedure: CYSTOSCOPY;  Surgeon: Festus Aloe, MD;  Location: [facility];  Service: Urology;  Laterality: N/A;  . FUDUCIAL PLACEMENT Right 02/06/2018   Procedure: PLACEMENT OF FIDUCIAL RIGHT LOWER LOBE LUNG;  Surgeon: [name], MD;  Location: [facility];  Service: Thoracic;  Laterality: Right;  . HEMORRHOID BANDING    . INSERTION OF SUPRAPUBIC CATHETER N/A 10/07/2016   Procedure: FOLEY  CATHETER PLACEMENT;  Surgeon: Festus Aloe, MD;  Location: [facility];  Service: Urology;  Laterality: N/A;  . KNEE ARTHROSCOPY Left    torn meniscus  . LUMBAR LAMINECTOMY/DECOMPRESSION MICRODISCECTOMY Right 08/07/2014   Procedure: complete DECOMPRESSION LUMBAR  LAMINECTOMY/MICRODISCECTOMY OF L4-L5 for spinal stenosis, DECOMPRESSION OF L3-L4;  Surgeon: Latanya Maudlin, MD;  [facility];  Service: Orthopedics;  Laterality: Right;  . NASAL SINUS SURGERY Left 07/04/2017   Procedure: LEFT ENDOSCOPIC SINUS SURGERY;  Surgeon: Jerrell Belfast, MD;  Location: [facility];  Service: ENT;  Laterality: Left;  . RIGHT/LEFT HEART CATH AND CORONARY ANGIOGRAPHY N/A 06/21/2017   Procedure: RIGHT/LEFT HEART CATH AND CORONARY ANGIOGRAPHY;  Surgeon: Belva Crome, MD;  Location: [facility];  Service: Cardiovascular;  Laterality: N/A;  . ROTATOR CUFF REPAIR Right   . SHOULDER ARTHROSCOPY     x3, L & R  . SINUS ENDO WITH FUSION N/A 07/04/2017   Procedure: SINUS ENDO WITH FUSION;  Surgeon: Jerrell Belfast, MD;  Location: [facility];  Service: ENT;  Laterality: N/A;  . VIDEO ASSISTED THORACOSCOPY (VATS)/ LOBECTOMY Right 10/07/2016   Procedure: VIDEO ASSISTED THORACOSCOPY (VATS)/RIGHT MIDDLE LOBECTOMY;  Surgeon: [name], MD;  Location: [facility];  Service: Thoracic;  Laterality: Right;  [name] VIDEO BRONCHOSCOPY WITH ENDOBRONCHIAL NAVIGATION N/A 09/23/2016   Procedure: VIDEO BRONCHOSCOPY WITH ENDOBRONCHIAL NAVIGATION;  Surgeon: [name], MD;  Location: [facility];  Service: Thoracic;  Laterality: N/A;  . VIDEO BRONCHOSCOPY WITH ENDOBRONCHIAL NAVIGATION N/A 02/06/2018   Procedure: VIDEO BRONCHOSCOPY WITH ENDOBRONCHIAL NAVIGATION;  Surgeon: [name], MD;  Location: [facility];  Service: Thoracic;  Laterality: N/A;  . VIDEO BRONCHOSCOPY WITH ENDOBRONCHIAL ULTRASOUND N/A 09/23/2016   Procedure: VIDEO BRONCHOSCOPY WITH ENDOBRONCHIAL ULTRASOUND;  Surgeon: Modesto Charon  C, MD;  [facility];  Service: Thoracic;  Laterality: N/A;    Current Outpatient Medications  Medication Sig Dispense Refill  . apixaban (ELIQUIS) 5 MG TABS tablet Take 1 tablet (5 mg total) by mouth 2 (two) times daily. Pt is overdue for follow-up, Must see MD for future refills. 60 tablet 0   . calcium carbonate (TUMS - DOSED IN MG ELEMENTAL CALCIUM) 500 MG chewable tablet Chew 1 tablet by mouth daily as needed for indigestion or heartburn.     . finasteride (PROSCAR) 5 MG tablet Take 5 mg by mouth at bedtime.     . furosemide (LASIX) 20 MG tablet TAKE 1 TABLET BY MOUTH EVERY DAY. 90 tablet 3  . propafenone (RYTHMOL) 225 MG tablet Take 1 tablet (225 mg total) by mouth 2 (two) times daily. 180 tablet 3  . rivastigmine (EXELON) 1.5 MG capsule Take 1 capsule twice daily 180 capsule 1  . silodosin (RAPAFLO) 8 MG CAPS capsule Take 8 mg by mouth at bedtime.      No current facility-administered medications for this visit.    Allergies  Allergen Reactions  . Alfuzosin Other (See Comments) and Hypertension    BP went crazy, dizzy, passing out   . Aricept [Donepezil Hcl] Other (See Comments)    Insomnia, headache  . Penicillins Hives and Other (See Comments)    A PCN REACTION WITH IMMEDIATE RASH, FACIAL/TONGUE/THROAT SWELLING, SOB, OR LIGHTHEADEDNESS WITH HYPOTENSION:  #  #  #  YES  #  #  #   Has patient had a PCN reaction causing severe rash involving mucus membranes or skin necrosis:No Has patient had a PCN reaction that required hospitalization:No Has patient had a PCN reaction occurring within the last 10 years:No If all of the above answers are "NO", then may proceed with Cephalosporin use.     Social History   Socioeconomic History  . Marital status: Widowed    Spouse name: Not on file  . Number of children: 3  . Years of education: Not on file  . Highest education level: Not on file  Occupational History  . Occupation: Retired    Fish farm manager: RETIRED  Tobacco Use  . Smoking status: Former Smoker    Packs/day: 1.00    Years: 23.00    Pack years: 23.00    Quit date: 1978    Years since quitting: 44.2  . Smokeless tobacco: Never Used  Vaping Use  . Vaping Use: Never used  Substance and Sexual Activity  . Alcohol use: Yes    Comment: rare  . Drug use: No  .  Sexual activity: Not on file  Other Topics Concern  . Not on file  Social History Narrative  . Not on file   Social Determinants of Health   Financial Resource Strain: Not on file  Food Insecurity: Not on file  Transportation Needs: Not on file  Physical Activity: Not on file  Stress: Not on file  Social Connections: Not on file  Intimate Partner Violence: Not on file     Review of Systems: General: No chills, fever, night sweats or weight changes  Cardiovascular:  No chest pain, dyspnea on exertion, edema, orthopnea, palpitations, paroxysmal nocturnal dyspnea Dermatological: No rash, lesions or masses Respiratory: No cough, dyspnea Urologic: No hematuria, dysuria Abdominal: No nausea, vomiting, diarrhea, bright red blood per rectum, melena, or hematemesis Neurologic: No visual changes, weakness, changes in mental status All other systems reviewed and are otherwise negative except as noted above.  Physical Exam: There were no vitals filed for this visit.  GEN- The patient is well appearing, alert and oriented x 3 today.   HEENT: normocephalic, atraumatic; sclera clear, conjunctiva pink; hearing intact; oropharynx clear; neck supple, no JVP Lymph- no cervical lymphadenopathy Lungs- Clear to ausculation bilaterally, normal work of breathing.  No wheezes, rales, rhonchi Heart- Regular rate and rhythm, no murmurs, rubs or gallops, PMI not laterally displaced GI- soft, non-tender, non-distended, bowel sounds present, no hepatosplenomegaly Extremities- no clubbing, cyanosis, or edema; DP/PT/radial pulses 2+ bilaterally MS- no significant deformity or atrophy Skin- warm and dry, no rash or lesion Psych- euthymic mood, full affect Neuro- strength and sensation are intact  EKG is ordered. Personal review of EKG from today shows NSR at 72 bpm, QRS 84 bpm, PR interval 188 ms  Additional studies reviewed include: Previous EP office notes  Assessment and Plan:  1. PAF EKG today  shows NSR on propafenone 225 mg BID Continue eliquis for CHA2DS2VASC of at least 3.    2. H/o Lung CA S/p XRT and now watchful waiting under the direction of Dr. Julien Nordmann   3.DOE Normal EF 04/2017 Myoview abnormal 06/10/2017 -> FALSE positive based on [facility] 06/21/2017 He denies symptoms at slow pace and is able to do his ADLs for the most part without difficulty.  Shirley Friar, PA-C  05/16/20 11:14 AM

## 2020-05-17 LAB — BASIC METABOLIC PANEL
BUN/Creatinine Ratio: 18 (ref 10–24)
BUN: 23 mg/dL (ref 8–27)
CO2: 26 mmol/L (ref 20–29)
Calcium: 9 mg/dL (ref 8.6–10.2)
Chloride: 102 mmol/L (ref 96–106)
Creatinine, Ser: 1.28 mg/dL — ABNORMAL HIGH (ref 0.76–1.27)
Glucose: 83 mg/dL (ref 65–99)
Potassium: 4.1 mmol/L (ref 3.5–5.2)
Sodium: 141 mmol/L (ref 134–144)
eGFR: 54 mL/min/{1.73_m2} — ABNORMAL LOW (ref >59)

## 2020-05-17 LAB — CBC
Hematocrit: 33.3 % — ABNORMAL LOW (ref 37.5–51.0)
Hemoglobin: 12.6 g/dL — ABNORMAL LOW (ref 13.0–17.7)
MCH: 35.1 pg — ABNORMAL HIGH (ref 26.6–33.0)
MCHC: 37.8 g/dL — ABNORMAL HIGH (ref 31.5–35.7)
MCV: 93 fL (ref 79–97)
Platelets: 229 10*3/uL (ref 150–450)
RBC: 3.59 x10E6/uL — ABNORMAL LOW (ref 4.14–5.80)
RDW: 12.7 % (ref 11.6–15.4)
WBC: 5.5 10*3/uL (ref 3.4–10.8)

## 2020-05-17 LAB — MAGNESIUM: Magnesium: 2.2 mg/dL (ref 1.6–2.3)

## 2020-05-19 ENCOUNTER — Other Ambulatory Visit: Payer: Self-pay | Admitting: Internal Medicine

## 2020-05-22 ENCOUNTER — Other Ambulatory Visit: Payer: Self-pay | Admitting: Internal Medicine

## 2020-05-22 MED ORDER — PROPAFENONE HCL 225 MG PO TABS: 225.0000 mg | ORAL_TABLET | Freq: Two times a day (BID) | ORAL | 3 refills | Status: AC

## 2020-06-02 ENCOUNTER — Telehealth: Payer: Self-pay | Admitting: *Deleted

## 2020-06-02 ENCOUNTER — Encounter: Payer: Self-pay | Admitting: Radiation Oncology

## 2020-06-02 ENCOUNTER — Other Ambulatory Visit: Payer: Self-pay | Admitting: Medical Oncology

## 2020-06-02 ENCOUNTER — Telehealth: Payer: Self-pay

## 2020-06-02 ENCOUNTER — Other Ambulatory Visit: Payer: Self-pay

## 2020-06-02 ENCOUNTER — Telehealth: Payer: Self-pay | Admitting: Medical Oncology

## 2020-06-02 NOTE — Telephone Encounter (Signed)
CALLED PATIENT TO INFORM OF CT FOR 06-06-20- ARRVIAL TIME - 1:15 PM @ [facility], PATIENT TO HAVE WATER ONLY - 4 HRS. PRIOR TO TEST, PATIENT TO RECEIVE RESULTS FROM [facility] ON 06-09-20 @ 1 PM VIA TELEPHONE, SPOKE WITH PATIENT AND HE IS AWARE OF THESE APPTS.

## 2020-06-02 NOTE — Telephone Encounter (Signed)
Raymond Logan asked if Raymond Logan can have his  scan done at [facility] and a virtual followup. I transferred her call  to Radiation oncology .

## 2020-06-02 NOTE — Telephone Encounter (Signed)
Spoke with patient HCPOA [name] in regards to telephone appointment with Shona Simpson PA on 06/09/20 @ 1:00pm. Called to review meaningful use questions. TM

## 2020-06-03 ENCOUNTER — Telehealth: Payer: Self-pay | Admitting: *Deleted

## 2020-06-03 NOTE — Telephone Encounter (Signed)
Raymond Logan- this is fine if he wants his scans done at [facility]- can you help coordinate and make sure we have report and films powershared as well?

## 2020-06-03 NOTE — Telephone Encounter (Signed)
Called patient's daughter-Jean to inform that scan has been moved to [facility] on 06-30-20- arrival time- 12:45 pm, patient to have water only - 4 hrs. prior to test, patient to follow-up with PA Shona Simpson on 07-01-20 @ 8:30 am for results, lvm for a return call

## 2020-06-06 ENCOUNTER — Ambulatory Visit (HOSPITAL_COMMUNITY): Payer: Medicare Other

## 2020-06-09 ENCOUNTER — Inpatient Hospital Stay
Admission: RE | Admit: 2020-06-09 | Discharge: 2020-06-09 | Disposition: A | Discharge status: Home / Self Care | Payer: Self-pay | Source: Ambulatory Visit | Attending: Radiation Oncology | Admitting: Radiation Oncology

## 2020-06-09 DIAGNOSIS — R911 Solitary pulmonary nodule: Secondary | ICD-10-CM

## 2020-06-10 ENCOUNTER — Other Ambulatory Visit: Payer: Self-pay | Admitting: Neurology

## 2020-06-10 ENCOUNTER — Other Ambulatory Visit: Payer: Self-pay | Admitting: Internal Medicine

## 2020-06-10 NOTE — Telephone Encounter (Signed)
Age 87, weight 62kg, SCr 1.28 on 05/16/20. Afib dx, last OV 04/2018

## 2020-06-23 ENCOUNTER — Encounter: Payer: Self-pay | Admitting: Radiation Oncology

## 2020-06-23 ENCOUNTER — Telehealth: Payer: Self-pay | Admitting: *Deleted

## 2020-06-23 ENCOUNTER — Encounter: Payer: Self-pay | Admitting: Neurology

## 2020-06-23 ENCOUNTER — Other Ambulatory Visit: Payer: Self-pay | Admitting: Radiation Oncology

## 2020-06-23 DIAGNOSIS — C342 Malignant neoplasm of middle lobe, bronchus or lung: Secondary | ICD-10-CM

## 2020-06-23 NOTE — Telephone Encounter (Signed)
CALLED PATIENT'S DAUGHTER- ELIZABETH JOHNSON TO INFORM OF [place] VISIT WITH DR. Merleen Nicely @ [facility] ON 07-15-20 - ARRIVAL TIME - 11 AM - ADDRESS- [address] [facility] [address], [place], [place] PHONE [phone], PATIENT'S DAUGHTER- ELIZABETH JOHNSON VERIFIED UNDERSTANDING THIS APPT.

## 2020-06-23 NOTE — Progress Notes (Signed)
We were notified that the paitent will be relocating the [facility] area and will be living closer to his daughter. She requested referral to provider closer there. New referral was sent to Dr. Francesca Jewett at [facility] in radiation oncology as he's had stable disease s/p SBRT for early stage lung cancer.     Carola Rhine, PAC

## 2020-06-26 ENCOUNTER — Telehealth: Payer: Self-pay

## 2020-06-26 NOTE — Telephone Encounter (Signed)
I called pt's Daughter back. Raymond Logan, Pt's POA) I told her that [name] didn't know of any Cardiologist in the [place], [place] to refer him to. She stated that he is scheduled with a new PCP in that area and she will ask them who they recommend. She stated that she was very thankful for the care that her Dad received while he was here in our area.

## 2020-06-30 ENCOUNTER — Ambulatory Visit (HOSPITAL_COMMUNITY): Payer: Medicare Other

## 2020-06-30 ENCOUNTER — Encounter: Payer: Self-pay | Admitting: Neurology

## 2020-06-30 NOTE — Telephone Encounter (Signed)
Pt's daughter, [name]) father moving to [facility] to be closer to his daughter. Will no longer need service at [facility]. Do we need a referral from [facility] for a new neurologist. Would like a call from the nurse.

## 2020-07-01 ENCOUNTER — Ambulatory Visit: Payer: Medicare Other | Admitting: Radiation Oncology

## 2020-07-01 NOTE — Telephone Encounter (Signed)
Spoke to daughter who lives in [facility].  Pt now living in [place] living now.  Looking for new Neurologist. [facility]).  She did not know name of one, but will let us know when one I chosen (from pcp) then we can fax or mail records to the practice.

## 2020-07-02 NOTE — Telephone Encounter (Signed)
error 

## 2020-08-01 ENCOUNTER — Ambulatory Visit: Payer: Medicare Other | Admitting: Neurology

## 2020-08-14 ENCOUNTER — Ambulatory Visit: Payer: Medicare Other | Admitting: Neurology
